# Patient Record
Sex: Female | Born: 1960 | Race: White | Hispanic: No | Marital: Married | Smoking: Never smoker
Health system: Southern US, Community
[De-identification: ages and names within clinical notes are randomized; demographics above are authoritative.]

## PROBLEM LIST (undated history)

## (undated) DIAGNOSIS — I509 Heart failure, unspecified: Secondary | ICD-10-CM

## (undated) DIAGNOSIS — I10 Essential (primary) hypertension: Secondary | ICD-10-CM

## (undated) DIAGNOSIS — F329 Major depressive disorder, single episode, unspecified: Secondary | ICD-10-CM

## (undated) DIAGNOSIS — M797 Fibromyalgia: Secondary | ICD-10-CM

## (undated) DIAGNOSIS — E119 Type 2 diabetes mellitus without complications: Secondary | ICD-10-CM

## (undated) DIAGNOSIS — E785 Hyperlipidemia, unspecified: Secondary | ICD-10-CM

## (undated) DIAGNOSIS — D649 Anemia, unspecified: Secondary | ICD-10-CM

## (undated) DIAGNOSIS — E039 Hypothyroidism, unspecified: Secondary | ICD-10-CM

## (undated) DIAGNOSIS — F32A Depression, unspecified: Secondary | ICD-10-CM

---

## 2000-01-23 ENCOUNTER — Other Ambulatory Visit: Admission: RE | Admit: 2000-01-23 | Discharge: 2000-01-23 | Payer: Self-pay | Admitting: Obstetrics & Gynecology

## 2001-05-15 ENCOUNTER — Encounter: Payer: Self-pay | Admitting: *Deleted

## 2001-05-15 ENCOUNTER — Encounter: Admission: RE | Admit: 2001-05-15 | Discharge: 2001-05-15 | Payer: Self-pay | Admitting: *Deleted

## 2001-07-03 ENCOUNTER — Other Ambulatory Visit: Admission: RE | Admit: 2001-07-03 | Discharge: 2001-07-03 | Payer: Self-pay | Admitting: Obstetrics & Gynecology

## 2002-11-25 ENCOUNTER — Other Ambulatory Visit: Admission: RE | Admit: 2002-11-25 | Discharge: 2002-11-25 | Payer: Self-pay | Admitting: Obstetrics & Gynecology

## 2003-11-30 ENCOUNTER — Other Ambulatory Visit: Admission: RE | Admit: 2003-11-30 | Discharge: 2003-11-30 | Payer: Self-pay | Admitting: Obstetrics & Gynecology

## 2005-01-02 ENCOUNTER — Other Ambulatory Visit: Admission: RE | Admit: 2005-01-02 | Discharge: 2005-01-02 | Payer: Self-pay | Admitting: Obstetrics & Gynecology

## 2006-01-14 ENCOUNTER — Other Ambulatory Visit: Admission: RE | Admit: 2006-01-14 | Discharge: 2006-01-14 | Payer: Self-pay | Admitting: Obstetrics & Gynecology

## 2009-04-23 ENCOUNTER — Emergency Department (HOSPITAL_COMMUNITY): Admission: EM | Admit: 2009-04-23 | Discharge: 2009-04-23 | Payer: Self-pay | Admitting: Emergency Medicine

## 2009-05-12 ENCOUNTER — Emergency Department (HOSPITAL_COMMUNITY): Admission: EM | Admit: 2009-05-12 | Discharge: 2009-05-12 | Payer: Self-pay | Admitting: Emergency Medicine

## 2009-05-19 ENCOUNTER — Encounter: Payer: Self-pay | Admitting: Infectious Diseases

## 2009-05-20 ENCOUNTER — Ambulatory Visit: Payer: Self-pay | Admitting: Infectious Diseases

## 2009-05-20 ENCOUNTER — Ambulatory Visit (HOSPITAL_COMMUNITY): Admission: RE | Admit: 2009-05-20 | Discharge: 2009-05-20 | Payer: Self-pay | Admitting: Infectious Diseases

## 2009-05-20 DIAGNOSIS — A689 Relapsing fever, unspecified: Secondary | ICD-10-CM | POA: Insufficient documentation

## 2009-05-20 DIAGNOSIS — R112 Nausea with vomiting, unspecified: Secondary | ICD-10-CM | POA: Insufficient documentation

## 2009-05-20 DIAGNOSIS — L519 Erythema multiforme, unspecified: Secondary | ICD-10-CM | POA: Insufficient documentation

## 2009-05-20 LAB — CONVERTED CEMR LAB
ALT: 17 units/L (ref 0–35)
AST: 15 units/L (ref 0–37)
Albumin: 4 g/dL (ref 3.5–5.2)
Alkaline Phosphatase: 64 units/L (ref 39–117)
BUN: 15 mg/dL (ref 6–23)
Basophils Absolute: 0 10*3/uL (ref 0.0–0.1)
Basophils Relative: 0 % (ref 0–1)
CO2: 27 meq/L (ref 19–32)
Calcium: 9.9 mg/dL (ref 8.4–10.5)
Chloride: 93 meq/L — ABNORMAL LOW (ref 96–112)
Creatinine, Ser: 0.91 mg/dL (ref 0.40–1.20)
Eosinophils Absolute: 0.2 10*3/uL (ref 0.0–0.7)
Eosinophils Relative: 1 % (ref 0–5)
Glucose, Bld: 108 mg/dL — ABNORMAL HIGH (ref 70–99)
HCT: 32.2 % — ABNORMAL LOW (ref 36.0–46.0)
Hemoglobin: 10.2 g/dL — ABNORMAL LOW (ref 12.0–15.0)
Herpes Simplex Vrs I&II-IgM Ab (EIA): 3.62 — ABNORMAL HIGH
Lymphocytes Relative: 14 % (ref 12–46)
Lymphs Abs: 1.5 10*3/uL (ref 0.7–4.0)
MCHC: 31.7 g/dL (ref 30.0–36.0)
MCV: 82.8 fL (ref 78.0–100.0)
Monocytes Absolute: 0.7 10*3/uL (ref 0.1–1.0)
Monocytes Relative: 7 % (ref 3–12)
Neutro Abs: 8.2 10*3/uL — ABNORMAL HIGH (ref 1.7–7.7)
Neutrophils Relative %: 77 % (ref 43–77)
Platelets: 429 10*3/uL — ABNORMAL HIGH (ref 150–400)
Potassium: 4.3 meq/L (ref 3.5–5.3)
RBC: 3.89 M/uL (ref 3.87–5.11)
RDW: 15 % (ref 11.5–15.5)
Sed Rate: 30 mm/hr — ABNORMAL HIGH (ref 0–22)
Sodium: 137 meq/L (ref 135–145)
Total Bilirubin: 0.4 mg/dL (ref 0.3–1.2)
Total Protein: 7 g/dL (ref 6.0–8.3)
WBC: 10.6 10*3/uL — ABNORMAL HIGH (ref 4.0–10.5)

## 2009-05-21 ENCOUNTER — Encounter: Payer: Self-pay | Admitting: Infectious Diseases

## 2009-05-24 ENCOUNTER — Telehealth: Payer: Self-pay | Admitting: Infectious Diseases

## 2009-06-01 ENCOUNTER — Ambulatory Visit: Payer: Self-pay | Admitting: Infectious Diseases

## 2009-06-02 ENCOUNTER — Encounter: Payer: Self-pay | Admitting: Infectious Diseases

## 2009-06-13 ENCOUNTER — Encounter: Payer: Self-pay | Admitting: Infectious Diseases

## 2010-02-28 ENCOUNTER — Encounter (HOSPITAL_COMMUNITY): Admission: RE | Admit: 2010-02-28 | Discharge: 2010-05-11 | Payer: Self-pay | Admitting: Endocrinology

## 2010-10-29 HISTORY — PX: ENDOMETRIAL ABLATION: SHX621

## 2010-11-30 NOTE — Progress Notes (Signed)
Summary: phone note requesing labs/TY  Phone Note Call from Patient   Caller: Patient Call For: Clydie Braun MD Summary of Call: Patient calling requesting the results of labwork. She said someone called yesterday to give her results but she wasn't home. Initial call taken by: Starleen Arms CMA,  May 24, 2009 11:11 AM  Follow-up for Phone Call        i did not call her. Lab tests were all negative or unrevealing so far.  Can you call her to let her know Follow-up by: Clydie Braun MD,  May 24, 2009 12:45 PM  Additional Follow-up for Phone Call Additional follow up Details #1::        Thanks, patient is aware.

## 2010-11-30 NOTE — Miscellaneous (Signed)
Summary: HIPAA Restirictions  HIPAA Restirictions   Imported By: Florinda Marker 05/20/2009 15:41:52  _____________________________________________________________________  External Attachment:    Type:   Image     Comment:   External Document

## 2010-11-30 NOTE — Assessment & Plan Note (Signed)
Summary: new pt per df/kam   CC:  new patient.  History of Present Illness: one month of  June 26th ankle swelling and L arm rash.  went to ED fast tact and changed to increased dose of hctz.  was having low grade fever. Several days later had rash on both arms, increasing swelling and pain in arms, knees, elbows necks. -went to prompt med on 7/2 7/2 Seen at [facility] w/u done.  Fevers to 100.9.  Treated for ?RMSF - got doxy esr 90 RF 30.  July 9th f/u started prednisone for rash. still on doxy for 21 days  Prednisone heled with the rash  7/15 Return to ed visit fever ha chills achiness, neck pain.  T 102.5. UA trace hgb, ow neg,  Had LP done CSF - WBC present but no org wbc 4 rbc 0 tube 4 wbc 6 rbc1 glucose 60 prot 31  No other abx given.  7/16 vomiting since that time esp at night.  .  Continues with low grade fevers 101.  rash continues  Rash recurring since stopping the prednisone last thursday.  Swelling improving somewhat Also recently   No other sick contacts     Preventive Screening-Counseling & Management  Alcohol-Tobacco     Alcohol drinks/day: 0     Smoking Status: never  Caffeine-Diet-Exercise     Caffeine use/day: tea,coffee     Does Patient Exercise: yes     Type of exercise: video tape exercising     Exercise (avg: min/session): <30     Times/week: 3  Safety-Violence-Falls     Seat Belt Use: yes   Updated Prior Medication List: FLUOXETINE HCL 20 MG CAPS (FLUOXETINE HCL) Take 1 tablet by mouth once a day HYDROCHLOROTHIAZIDE 12.5 MG TABS (HYDROCHLOROTHIAZIDE) Take 1 tablet by mouth once a day LIPITOR 10 MG TABS (ATORVASTATIN CALCIUM) Take 1 tablet by mouth once a day LEVOTHYROXINE SODIUM 75 MCG TABS (LEVOTHYROXINE SODIUM) Take 1 tablet by mouth once a day LISINOPRIL 20 MG TABS (LISINOPRIL) Take 1 tablet by mouth once a day METFORMIN HCL 1000 MG TABS (METFORMIN HCL) Take 1 tablet by mouth two times a day GLIMEPIRIDE 2 MG TABS (GLIMEPIRIDE) Take 1 tablet by  mouth two times a day CYCLOBENZAPRINE HCL 10 MG TABS (CYCLOBENZAPRINE HCL) as needed GABAPENTIN 600 MG TABS (GABAPENTIN) Take 1 tablet by mouth at bedtime TRAMADOL HCL 50 MG TABS (TRAMADOL HCL) Take 1 tablet by mouth at bedtime and as needed MULTIVITAMINS  TABS (MULTIPLE VITAMIN) Take 1 tablet by mouth once a day VITAMIN D3 1000 UNIT TABS (CHOLECALCIFEROL) Take 2 tablets by mouth once a day FERREX 28  TABS (FEASPGL-FEFUM-B12-FA-C-SUCC AC) Take 1 tablet by mouth once a day  Current Allergies (reviewed today): No known allergies  Past History:  Family History: Last updated: 05/20/2009 [place]  Social History: Last updated: 05/20/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   tob alcohol none drugs travel  Has 2 dogs and removes ticks but no other tick exposures.  Lives in [place], has 2 dogs, no farm animals, no travel no.  Has a well.  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   tob alcohol none drugs travel  Has 2 dogs and removes ticks but no other  tick exposures.  Lives in [place], has 2 dogs, no farm animals, no travel no.  Has a well.  No water exposures  Review of Systems       11 systems reviewed and negative except per HPI   Vital Signs:  Patient profile:   50 year old female Height:      66 inches (167.64 cm) Weight:      162.8 pounds (74 kg) BMI:     26.37 Temp:     98.4 degrees F (36.89 degrees C) oral Pulse rate:   116 / minute BP sitting:   129 / 85  (left arm)  Vitals Entered By: Rocky Morel) (May 20, 2009 9:50 AM) CC: new patient Is Patient Diabetic? Yes  Pain Assessment Patient in pain? yes     Location: hands and  feet Intensity: 3 Type: soreness Nutritional Status BMI of 25 - 29 = overweight Nutritional Status Detail blood sugar count this morning was 137. appetite is not good, vomited last night (05-19-09) per patient.  Have you ever been in a relationship where you felt threatened, hurt or afraid?Unable to ask-daughter in room   Does patient need assistance? Functional Status Self care Ambulation Normal   Physical Exam  General:  alert and well-developed.   Head:  normocephalic.   Eyes:  vision grossly intact, pupils equal, and pupils round. No conjuctial lesiosn   Ears:  R ear normal and L ear normal.   Mouth:  good dentition.  mild erythema in throat. No oral lesions mild cold sore on lower lip Neck:  supple.   Lungs:  normal respiratory effort and normal breath sounds.   Heart:  normal rate, regular rhythm, and no murmur.   Abdomen:  soft, non-tender, and normal bowel sounds.   Msk:  normal ROM, no joint tenderness, no joint swelling, no joint warmth, and no redness over joints.   Pulses:  2+ bil Extremities:  nocce Neurologic:  alert & oriented X3, cranial nerves II-XII intact, strength normal in all extremities, sensation intact to light touch, sensation intact to pinprick, and gait normal.   Skin:  arms and palms and dorsum of hands  with multiple distinct varied forms of  macular pink lesions.  typical bulls eye on several spots.  No vesiicles or spreading redness  Spares chest, trunk, face and LE Cervical Nodes:  no anterior cervical adenopathy and no posterior cervical adenopathy.   Psych:  Oriented X3.   Additional Exam:  7/2 ESR90 RF 30 ANA neg,compwnl lyme Igg igm neg  ebv old infxn parvo igg 2.6 Positive  igm <0.9  rmsf igm 0.99 equivicol CBC  WBC                                      8.2               4.0-10.5         K/uL  RBC                                      3.82       l      3.87-5.11        MIL/uL  Hemoglobin (HGB)  10.6       l       12.0-15.0        g/dL  Hematocrit (HCT)                         32.3       l      36.0-46.0        %  MCV                                      84.6              78.0-100.0       fL  MCHC                                     32.7              30.0-36.0        g/dL  RDW                                      15.9       h      11.5-15.5        %  Platelet Count (PLT)                     415        h      150-400          K/uL  Neutrophils, %                           66                43-77            %  Lymphocytes, %                           23                12-46            %  Monocytes, %                             7                 3-12             %  Eosinophils, %                           3                 0-5              %  Basophils, %                             1      Impression & Recommendations:  Problem # 1:  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  She really has no pulm sxs to suggest pna but will check cxr for "walking PNA".     No new meds or drugs.  No sick contacts. Will check  Comp panel, cbc rpr, hiv bcx mycoplasma serology cold agglutins HSV serology F/U2 weeks If recurs will consider in future  hepatitis panel bxp of rash erhlicia coxsackie  Also consider valtrex if potentially due to hsv   Orders: T-Herpes I and II, IgM, Reflex ([id number]) T-HIV Antibody  (Reflex) [id number]) T-RPR (Syphilis) ([id number]) T-Sed Rate (Automated) ([id number]) T-CBC w/Diff ([id number]) T- * Misc. Laboratory test [id number]) T-Culture, Blood Routine ([id number]) T- * Misc. Laboratory test ([phone]) Consultation Level IV [id number]) CXR- 2view (CXR) T-Comprehensive Metabolic Panel  ([id number]) T-Culture, Blood Routine ([id number])  Problem # 2:  FEVER, RECURRENT (ICD-087.9) Assessment: New as above.   Continue to monitor your temperature three times a day and record.  Bring your temperature and symptom diary with you to nnext visit. Call if new or concerning symptoms  Problem # 3:  NAUSEA WITH VOMITING (ICD-787.01) unclear etiology. ? due to doxy. Check LFTs and eval.  Medications Added to Medication List This Visit: 1)  Fluoxetine Hcl 20 Mg Caps (Fluoxetine hcl) .... Take 1 tablet by mouth once a day 2)  Hydrochlorothiazide 12.5 Mg Tabs (Hydrochlorothiazide) .... Take 1 tablet by mouth once a day 3)  Lipitor 10 Mg Tabs (Atorvastatin calcium) .... Take 1 tablet by mouth once a day 4)  Levothyroxine Sodium 75 Mcg Tabs (Levothyroxine sodium) .... Take 1 tablet by mouth once a day 5)  Lisinopril 20 Mg Tabs (Lisinopril) .... Take 1 tablet by mouth once a day 6)  Metformin Hcl 1000 Mg Tabs (Metformin hcl) .... Take 1 tablet by mouth two times a day 7)  Glimepiride 2 Mg Tabs (Glimepiride) .... Take 1 tablet by mouth two times a day 8)  Cyclobenzaprine Hcl 10 Mg Tabs (Cyclobenzaprine hcl) .... As needed 9)  Gabapentin 600 Mg Tabs (Gabapentin) .... Take 1 tablet by mouth at bedtime 10)  Tramadol Hcl 50 Mg Tabs (Tramadol hcl) .... Take 1 tablet by mouth at bedtime and as needed 11)  Multivitamins Tabs (Multiple vitamin) .... Take 1 tablet by mouth once a day 12)  Vitamin D3 1000 Unit Tabs (Cholecalciferol) .... Take 2 tablets by mouth once a day 13)  Ferrex 28 Tabs (Feaspgl-fefum-b12-fa-c-succ ac) .... Take 1 tablet by mouth once a day  Patient Instructions: 1)  Please schedule a follow-up appointment in 2 weeks. 2)  Stop the doxycycline.  3)  Continue to monitor your temperature three times a day and record.  Bring your temperature and symptom diary with you to nnext visit. 4)  Call if new or concerning symptoms

## 2010-11-30 NOTE — Assessment & Plan Note (Signed)
Summary: 2WK F/U/VS   CC:  f/u ov   still having breakouts in new places itching with pain present and Depression.  History of Present Illness: Starting to feel better overall but rash continues.   palmar lsions are tender and raised continue to occur in all areas NO futher vomiting Temp max was 100.1 on 7/24 but no other temps above 100 since then. Not on any abx or steroids at this point   Prior history from July 23rd visit one month of Rash   June 26th ankle swelling and L arm rash.  went to ED fast tact and changed to increased dose of hctz.  was having low grade fever. Several days later had rash on both arms, increasing swelling and pain in arms, knees, elbows necks. -went to prompt med on 7/2 7/2 Seen at [facility] w/u done.  Fevers to 100.9.  Treated for ?RMSF - got doxy esr 90 RF 30.  July 9th f/u started prednisone for rash. still on doxy for 21 days  Prednisone heled with the rash  7/15 Return to ed visit fever ha chills achiness, neck pain.  T 102.5. UA trace hgb, ow neg,  Had LP done CSF - WBC present but no org wbc 4 rbc 0 tube 4 wbc 6 rbc1 glucose 60 prot 31  No other abx given.  7/16 vomiting since that time esp at night.  .  Continues with low grade fevers 101.  rash continues  Rash recurring since stopping the prednisone last thursday.  Swelling improving somewhat Also recently   No other sick contacts     Depression History:      The patient denies a depressed mood most of the day and a diminished interest in her usual daily activities.        The patient denies that she feels like life is not worth living, denies that she wishes that she were dead, and denies that she has thought about ending her life.        Preventive Screening-Counseling & Management  Alcohol-Tobacco     Alcohol drinks/day: 0     Smoking Status: never   Prior Medication List:  FLUOXETINE HCL 20 MG CAPS (FLUOXETINE HCL) Take 1 tablet by mouth once a day HYDROCHLOROTHIAZIDE 12.5 MG  TABS (HYDROCHLOROTHIAZIDE) Take 1 tablet by mouth once a day LIPITOR 10 MG TABS (ATORVASTATIN CALCIUM) Take 1 tablet by mouth once a day LEVOTHYROXINE SODIUM 75 MCG TABS (LEVOTHYROXINE SODIUM) Take 1 tablet by mouth once a day LISINOPRIL 20 MG TABS (LISINOPRIL) Take 1 tablet by mouth once a day METFORMIN HCL 1000 MG TABS (METFORMIN HCL) Take 1 tablet by mouth two times a day GLIMEPIRIDE 2 MG TABS (GLIMEPIRIDE) Take 1 tablet by mouth two times a day CYCLOBENZAPRINE HCL 10 MG TABS (CYCLOBENZAPRINE HCL) as needed GABAPENTIN 600 MG TABS (GABAPENTIN) Take 1 tablet by mouth at bedtime TRAMADOL HCL 50 MG TABS (TRAMADOL HCL) Take 1 tablet by mouth at bedtime and as needed MULTIVITAMINS  TABS (MULTIPLE VITAMIN) Take 1 tablet by mouth once a day VITAMIN D3 1000 UNIT TABS (CHOLECALCIFEROL) Take 2 tablets by mouth once a day FERREX 28  TABS (FEASPGL-FEFUM-B12-FA-C-SUCC AC) Take 1 tablet by mouth once a day   Current Allergies: No known allergies  Past History:  Past Medical History: Last updated: 05/20/2009 dm II HTN Hyoperchol fibromyalgia  Past Surgical History: Last updated: 05/20/2009 Lumpectomy L breast 1995 Bxp R breast  Family History: Last updated: 05/20/2009 [place]  Social History: Last updated:  05/20/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   tob alcohol none drugs travel  Has 2 dogs and removes ticks but no other tick exposures.  Lives in [place], has 2 dogs, no farm animals, no travel no.  Has a well.  No water exposures  Risk Factors: Alcohol Use: 0 (06/01/2009) Caffeine Use: tea,coffee (05/20/2009) Exercise: yes (05/20/2009)  Risk Factors: Smoking Status: never (06/01/2009)  Review of Systems       11 systems reviewed and negative except per HPI   Vital Signs:  Patient profile:   50 year old female Height:      66 inches Weight:      165.8 pounds BMI:     26.86 BSA:     1.85 Temp:     99.2 degrees F oral Pulse rate:   116 / minute BP sitting:   104 / 72   (left arm)  Vitals Entered By: Orland Mustard RN (June 01, 2009 3:37 PM) CC: f/u ov   still having breakouts in new places itching with pain present, Depression Is Patient Diabetic? Yes  Pain Assessment Patient in pain? yes     Location: biil  hand  Intensity: 3 Type: aching Nutritional Status Detail normal  Have you ever been in a relationship where you felt threatened, hurt or afraid?No  Domestic Violence Intervention none  Does patient need assistance? Functional Status Self care Ambulation Normal   Physical Exam  General:  alert and well-nourished.  tired appearing Head:  normocephalic and atraumatic.   Eyes:  vision grossly intact, pupils equal, and pupils round.  no lesions  Mouth:  fair dentition.   Neck:  supple.   Lungs:  normal respiratory effort and normal breath sounds.   Heart:  normal rate and no murmur.   Abdomen:  normal bowel sounds.   Msk:  normal ROM, no joint tenderness, and no joint swelling.   Extremities:  no cce Neurologic:  alert & oriented X3 and cranial nerves II-XII intact.   Skin:  palms with tender raised slighty annular lesions of various sizes ranging form 1/2 cm to 2 cm where they coalesce.  also some desquamation on palms  Multiple other various size lesions of different shapes and sizes and in different stages over the arms, thighs.and lower legs dorsum of feet.  Mianly over the dorsum of hands where they have coalescesda dn appear like deep bruises. Spares chest back abd and face  Cervical Nodes:  no anterior cervical adenopathy and no posterior cervical adenopathy.   Psych:  Oriented X3 and memory intact for recent and remote.     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  Fevers have resolved.   Her w/u below was neg however she still has recurring lesions.  Systemically feels a little better with resolution of fevers.   I still think she most likely has EM  minor given the characteristic lesions.  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    No new meds or drugs.  No sick contacts. At this point I will refer to derm and consider use of prednisone.  Workup so far Comp panel wnl  cbc - wbc 10.6 no eosinophilia, hgb mild anemia at 10.4, plts sligtly high rpr, hiv - Negative  bcx negative mycoplasma serology IgM negative, IgG with probable prior  cold agglutins negative HSV serology c/w old infection  Orders: Est. Patient Level IV (02774) Dermatology Referral (Derma)  Problem # 2:  FEVER, RECURRENT (ICD-087.9) Assessment: Improved  Orders: Est. Patient Level IV (12878) Dermatology Referral (Derma)  Problem # 3:  NAUSEA WITH VOMITING (ICD-787.01) Assessment: Improved  Patient Instructions: 1)  We will make an appointment with dermatology for you.   2)  Continue to monitor your temperature curve. 3)  Call if new symptoms occur. 4)  Please schedule a follow-up appointment in 1 month.

## 2010-11-30 NOTE — Consult Note (Signed)
Summary: Dr. Pollyann Savoy  Dr. Pollyann Savoy   Imported By: Florinda Marker 05/23/2009 14:51:17  _____________________________________________________________________  External Attachment:    Type:   Image     Comment:   External Document

## 2010-11-30 NOTE — Miscellaneous (Signed)
Summary: [facility] @ [facility] [place]   Imported By: Florinda Marker 06/15/2009 14:00:41  _____________________________________________________________________  External Attachment:    Type:   Image     Comment:   External Document

## 2010-11-30 NOTE — Consult Note (Signed)
Summary: [facility]   Imported By: Florinda Marker 06/02/2009 14:27:01  _____________________________________________________________________  External Attachment:    Type:   Image     Comment:   External Document

## 2010-12-25 ENCOUNTER — Encounter (HOSPITAL_COMMUNITY)
Admission: RE | Admit: 2010-12-25 | Discharge: 2010-12-25 | Disposition: A | Discharge status: Home / Self Care | Payer: BC Managed Care – PPO | Source: Ambulatory Visit | Attending: Obstetrics and Gynecology | Admitting: Obstetrics and Gynecology

## 2011-01-01 ENCOUNTER — Ambulatory Visit (HOSPITAL_COMMUNITY)
Admission: RE | Admit: 2011-01-01 | Discharge: 2011-01-01 | Disposition: A | Discharge status: Home / Self Care | Payer: BC Managed Care – PPO | Source: Ambulatory Visit | Attending: Obstetrics and Gynecology | Admitting: Obstetrics and Gynecology

## 2011-01-01 ENCOUNTER — Other Ambulatory Visit: Payer: Self-pay | Admitting: Obstetrics and Gynecology

## 2011-01-01 DIAGNOSIS — N840 Polyp of corpus uteri: Secondary | ICD-10-CM | POA: Insufficient documentation

## 2011-01-01 DIAGNOSIS — N8501 Benign endometrial hyperplasia: Secondary | ICD-10-CM | POA: Insufficient documentation

## 2011-01-01 DIAGNOSIS — Z01818 Encounter for other preprocedural examination: Secondary | ICD-10-CM | POA: Insufficient documentation

## 2011-01-01 DIAGNOSIS — Z01812 Encounter for preprocedural laboratory examination: Secondary | ICD-10-CM | POA: Insufficient documentation

## 2011-01-01 DIAGNOSIS — N920 Excessive and frequent menstruation with regular cycle: Secondary | ICD-10-CM | POA: Insufficient documentation

## 2011-01-01 LAB — GLUCOSE, CAPILLARY
Glucose-Capillary: 141 mg/dL — ABNORMAL HIGH (ref 70–99)
Glucose-Capillary: 142 mg/dL — ABNORMAL HIGH (ref 70–99)

## 2011-01-05 NOTE — Op Note (Signed)
  NAME:  Robin Owen, ALCOTT NO.:  [id number]  MEDICAL RECORD NO.:  [id number]           PATIENT TYPE:  O  LOCATION:  [facility]                          FACILITY[facility]  [facility]  PHYSICIAN:  Malva Limes, M.D.    DATE OF BIRTH:  01-Jul-1961  DATE OF PROCEDURE:  01/01/2011 DATE OF DISCHARGE:                              OPERATIVE REPORT   PREOPERATIVE DIAGNOSES: 1. Menorrhagia. 2. Uterine fibroids.  POSTOPERATIVE DIAGNOSES: 1. Menorrhagia. 2. Uterine fibroids.  PROCEDURES: 1. Hysteroscopy. 2. Dilation and curettage. 3. Endometrial ablation with NovaSure device.  SURGEON:  Malva Limes, MD  ANESTHESIA:  General paracervical block.  ANTIBIOTIC:  Ancef 1 g.  DRAINS:  Red rubber catheter to bladder.  SPECIMENS:  Endometrial curettings sent to Pathology.  COMPLICATIONS:  None.  ESTIMATED BLOOD LOSS:  Minimal.  PROCEDURE IN DETAILS:  The patient was taken to the operating room where general anesthetic was administered without difficulty.  She was then placed in dorsal lithotomy position.  She was prepped and draped in the usual fashion for this procedure.  Her bladder was drained with a red rubber catheter.  A sterile speculum was placed in the vagina, 10 mL of 1% lidocaine was used for paracervical block.  The cervix was grasped with single-tooth tenaculum and sounded to 10 cm.  The cervix was serially dilated.  The hysteroscope was advanced into the uterine cavity at which point both ostia were visualized.  There was no evidence of any polyps or submucous fibroids.  The hysteroscope was then removed.  Sharp curettage was then performed.  The tissue was sent to Pathology.  The NovaSure device was then placed into uterine cavity.  The uterine cavity length was 6.5 cm and the width was 4.4 cm.  A seal test was passed.  The device was turned on for 1 minute.  The device was then removed.  The patient was awakened and taken to the recovery room in stable condition.   Instrument and lap counts were correct x2.          ______________________________ Malva Limes, M.D.     MA/MEDQ  D:  01/01/2011  T:  01/02/2011  Job:  [id number]  Electronically Signed by Malva Limes M.D. on 01/04/2011 09:46:33 AM

## 2011-02-05 LAB — POCT I-STAT, CHEM 8
BUN: 11 mg/dL (ref 6–23)
Calcium, Ion: 1.15 mmol/L (ref 1.12–1.32)
Chloride: 98 mEq/L (ref 96–112)
Creatinine, Ser: 0.9 mg/dL (ref 0.4–1.2)
Glucose, Bld: 67 mg/dL — ABNORMAL LOW (ref 70–99)
HCT: 32 % — ABNORMAL LOW (ref 36.0–46.0)
Hemoglobin: 10.9 g/dL — ABNORMAL LOW (ref 12.0–15.0)
Potassium: 4.2 mEq/L (ref 3.5–5.1)
Sodium: 137 mEq/L (ref 135–145)
TCO2: 29 mmol/L (ref 0–100)

## 2011-02-05 LAB — DIFFERENTIAL
Basophils Absolute: 0 10*3/uL (ref 0.0–0.1)
Basophils Relative: 1 % (ref 0–1)
Eosinophils Absolute: 0.3 10*3/uL (ref 0.0–0.7)
Eosinophils Relative: 3 % (ref 0–5)
Lymphocytes Relative: 23 % (ref 12–46)
Lymphs Abs: 1.9 10*3/uL (ref 0.7–4.0)
Monocytes Absolute: 0.6 10*3/uL (ref 0.1–1.0)
Monocytes Relative: 7 % (ref 3–12)
Neutro Abs: 5.4 10*3/uL (ref 1.7–7.7)
Neutrophils Relative %: 66 % (ref 43–77)

## 2011-02-05 LAB — PROTEIN AND GLUCOSE, CSF
Glucose, CSF: 60 mg/dL (ref 43–76)
Total  Protein, CSF: 31 mg/dL (ref 15–45)

## 2011-02-05 LAB — CBC
HCT: 32.3 % — ABNORMAL LOW (ref 36.0–46.0)
Hemoglobin: 10.6 g/dL — ABNORMAL LOW (ref 12.0–15.0)
MCHC: 32.7 g/dL (ref 30.0–36.0)
MCV: 84.6 fL (ref 78.0–100.0)
Platelets: 415 10*3/uL — ABNORMAL HIGH (ref 150–400)
RBC: 3.82 MIL/uL — ABNORMAL LOW (ref 3.87–5.11)
RDW: 15.9 % — ABNORMAL HIGH (ref 11.5–15.5)
WBC: 8.2 10*3/uL (ref 4.0–10.5)

## 2011-02-05 LAB — CSF CULTURE W GRAM STAIN: Culture: NO GROWTH

## 2011-02-05 LAB — URINALYSIS, ROUTINE W REFLEX MICROSCOPIC
Bilirubin Urine: NEGATIVE
Glucose, UA: NEGATIVE mg/dL
Ketones, ur: NEGATIVE mg/dL
Leukocytes, UA: NEGATIVE
Nitrite: NEGATIVE
Protein, ur: NEGATIVE mg/dL
Specific Gravity, Urine: 1.005 (ref 1.005–1.030)
Urobilinogen, UA: 0.2 mg/dL (ref 0.0–1.0)
pH: 6 (ref 5.0–8.0)

## 2011-02-05 LAB — URINE MICROSCOPIC-ADD ON

## 2011-02-05 LAB — CSF CELL COUNT WITH DIFFERENTIAL
RBC Count, CSF: 0 /mm3
RBC Count, CSF: 1 /mm3 — ABNORMAL HIGH
Tube #: 3
Tube #: 4
WBC, CSF: 4 /mm3 (ref 0–5)
WBC, CSF: 6 /mm3 — ABNORMAL HIGH (ref 0–5)

## 2011-02-05 LAB — GRAM STAIN

## 2013-04-21 ENCOUNTER — Other Ambulatory Visit: Payer: Self-pay | Admitting: Obstetrics & Gynecology

## 2014-06-08 ENCOUNTER — Other Ambulatory Visit: Payer: Self-pay | Admitting: Obstetrics & Gynecology

## 2014-06-10 LAB — CYTOLOGY - PAP

## 2014-12-21 ENCOUNTER — Other Ambulatory Visit: Payer: Self-pay | Admitting: Gastroenterology

## 2014-12-21 DIAGNOSIS — K829 Disease of gallbladder, unspecified: Secondary | ICD-10-CM

## 2014-12-28 ENCOUNTER — Ambulatory Visit
Admission: RE | Admit: 2014-12-28 | Discharge: 2014-12-28 | Disposition: A | Discharge status: Home / Self Care | Payer: BLUE CROSS/BLUE SHIELD | Source: Ambulatory Visit | Attending: Gastroenterology | Admitting: Gastroenterology

## 2014-12-28 DIAGNOSIS — K829 Disease of gallbladder, unspecified: Secondary | ICD-10-CM

## 2015-09-14 ENCOUNTER — Encounter (HOSPITAL_BASED_OUTPATIENT_CLINIC_OR_DEPARTMENT_OTHER): Payer: Self-pay | Admitting: *Deleted

## 2015-09-14 ENCOUNTER — Observation Stay (HOSPITAL_BASED_OUTPATIENT_CLINIC_OR_DEPARTMENT_OTHER)
Admission: EM | Admit: 2015-09-14 | Discharge: 2015-09-17 | Disposition: A | Discharge status: Home / Self Care | Payer: BLUE CROSS/BLUE SHIELD | Attending: Internal Medicine | Admitting: Internal Medicine

## 2015-09-14 ENCOUNTER — Emergency Department (HOSPITAL_BASED_OUTPATIENT_CLINIC_OR_DEPARTMENT_OTHER): Payer: BLUE CROSS/BLUE SHIELD

## 2015-09-14 DIAGNOSIS — M797 Fibromyalgia: Secondary | ICD-10-CM | POA: Diagnosis not present

## 2015-09-14 DIAGNOSIS — R0602 Shortness of breath: Secondary | ICD-10-CM

## 2015-09-14 DIAGNOSIS — I5021 Acute systolic (congestive) heart failure: Secondary | ICD-10-CM | POA: Diagnosis present

## 2015-09-14 DIAGNOSIS — E785 Hyperlipidemia, unspecified: Secondary | ICD-10-CM | POA: Diagnosis not present

## 2015-09-14 DIAGNOSIS — I272 Other secondary pulmonary hypertension: Secondary | ICD-10-CM | POA: Insufficient documentation

## 2015-09-14 DIAGNOSIS — N179 Acute kidney failure, unspecified: Secondary | ICD-10-CM | POA: Insufficient documentation

## 2015-09-14 DIAGNOSIS — E119 Type 2 diabetes mellitus without complications: Secondary | ICD-10-CM | POA: Insufficient documentation

## 2015-09-14 DIAGNOSIS — I110 Hypertensive heart disease with heart failure: Principal | ICD-10-CM | POA: Insufficient documentation

## 2015-09-14 DIAGNOSIS — Z7984 Long term (current) use of oral hypoglycemic drugs: Secondary | ICD-10-CM | POA: Diagnosis not present

## 2015-09-14 DIAGNOSIS — Z79899 Other long term (current) drug therapy: Secondary | ICD-10-CM | POA: Diagnosis not present

## 2015-09-14 DIAGNOSIS — I10 Essential (primary) hypertension: Secondary | ICD-10-CM | POA: Diagnosis present

## 2015-09-14 DIAGNOSIS — E039 Hypothyroidism, unspecified: Secondary | ICD-10-CM | POA: Diagnosis not present

## 2015-09-14 DIAGNOSIS — E876 Hypokalemia: Secondary | ICD-10-CM | POA: Diagnosis not present

## 2015-09-14 DIAGNOSIS — I420 Dilated cardiomyopathy: Secondary | ICD-10-CM | POA: Insufficient documentation

## 2015-09-14 DIAGNOSIS — I428 Other cardiomyopathies: Secondary | ICD-10-CM

## 2015-09-14 DIAGNOSIS — I509 Heart failure, unspecified: Secondary | ICD-10-CM | POA: Insufficient documentation

## 2015-09-14 DIAGNOSIS — I429 Cardiomyopathy, unspecified: Secondary | ICD-10-CM

## 2015-09-14 HISTORY — DX: Fibromyalgia: M79.7

## 2015-09-14 HISTORY — DX: Essential (primary) hypertension: I10

## 2015-09-14 HISTORY — DX: Hyperlipidemia, unspecified: E78.5

## 2015-09-14 HISTORY — DX: Hypothyroidism, unspecified: E03.9

## 2015-09-14 HISTORY — DX: Type 2 diabetes mellitus without complications: E11.9

## 2015-09-14 LAB — BASIC METABOLIC PANEL
Anion gap: 10 (ref 5–15)
BUN: 17 mg/dL (ref 6–20)
CO2: 27 mmol/L (ref 22–32)
Calcium: 9.1 mg/dL (ref 8.9–10.3)
Chloride: 99 mmol/L — ABNORMAL LOW (ref 101–111)
Creatinine, Ser: 1.14 mg/dL — ABNORMAL HIGH (ref 0.44–1.00)
GFR calc Af Amer: >60 mL/min (ref >60)
GFR calc non Af Amer: 55 mL/min — ABNORMAL LOW (ref >60)
Glucose, Bld: 97 mg/dL (ref 65–99)
Potassium: 3.5 mmol/L (ref 3.5–5.1)
Sodium: 136 mmol/L (ref 135–145)

## 2015-09-14 LAB — CBC WITH DIFFERENTIAL/PLATELET
Basophils Absolute: 0 10*3/uL (ref 0.0–0.1)
Basophils Relative: 0 %
Eosinophils Absolute: 0.1 10*3/uL (ref 0.0–0.7)
Eosinophils Relative: 1 %
HCT: 35.8 % — ABNORMAL LOW (ref 36.0–46.0)
Hemoglobin: 11.6 g/dL — ABNORMAL LOW (ref 12.0–15.0)
Lymphocytes Relative: 20 %
Lymphs Abs: 2.4 10*3/uL (ref 0.7–4.0)
MCH: 28.5 pg (ref 26.0–34.0)
MCHC: 32.4 g/dL (ref 30.0–36.0)
MCV: 88 fL (ref 78.0–100.0)
Monocytes Absolute: 0.7 10*3/uL (ref 0.1–1.0)
Monocytes Relative: 6 %
Neutro Abs: 9.2 10*3/uL — ABNORMAL HIGH (ref 1.7–7.7)
Neutrophils Relative %: 73 %
Platelets: 385 10*3/uL (ref 150–400)
RBC: 4.07 MIL/uL (ref 3.87–5.11)
RDW: 14.9 % (ref 11.5–15.5)
WBC: 12.4 10*3/uL — ABNORMAL HIGH (ref 4.0–10.5)

## 2015-09-14 LAB — D-DIMER, QUANTITATIVE: D-Dimer, Quant: 1.72 ug/mL-FEU — ABNORMAL HIGH (ref 0.00–0.50)

## 2015-09-14 LAB — BRAIN NATRIURETIC PEPTIDE: B Natriuretic Peptide: 1819.1 pg/mL — ABNORMAL HIGH (ref 0.0–100.0)

## 2015-09-14 LAB — URINALYSIS, ROUTINE W REFLEX MICROSCOPIC
Bilirubin Urine: NEGATIVE
Glucose, UA: NEGATIVE mg/dL
Hgb urine dipstick: NEGATIVE
Ketones, ur: NEGATIVE mg/dL
Leukocytes, UA: NEGATIVE
Nitrite: NEGATIVE
Protein, ur: NEGATIVE mg/dL
Specific Gravity, Urine: 1.007 (ref 1.005–1.030)
pH: 6 (ref 5.0–8.0)

## 2015-09-14 LAB — CBG MONITORING, ED: Glucose-Capillary: 93 mg/dL (ref 65–99)

## 2015-09-14 LAB — TROPONIN I: Troponin I: 0.09 ng/mL — ABNORMAL HIGH (ref <0.031)

## 2015-09-14 MED ORDER — SODIUM CHLORIDE 0.9 % IJ SOLN
3.0000 mL | Freq: Two times a day (BID) | INTRAMUSCULAR | Status: DC
  Administered 2015-09-15 (×2): 3 mL via INTRAVENOUS

## 2015-09-14 MED ORDER — SODIUM CHLORIDE 0.9 % IV SOLN: 250.0000 mL | INTRAVENOUS | Status: DC | PRN

## 2015-09-14 MED ORDER — ONDANSETRON HCL 4 MG/2ML IJ SOLN: 4.0000 mg | Freq: Four times a day (QID) | INTRAMUSCULAR | Status: DC | PRN

## 2015-09-14 MED ORDER — IOHEXOL 350 MG/ML SOLN
100.0000 mL | Freq: Once | INTRAVENOUS | Status: AC | PRN
  Administered 2015-09-14: 100 mL via INTRAVENOUS

## 2015-09-14 MED ORDER — FUROSEMIDE 10 MG/ML IJ SOLN
40.0000 mg | Freq: Once | INTRAMUSCULAR | Status: AC
  Administered 2015-09-15: 40 mg via INTRAVENOUS
  Filled 2015-09-14: qty 4

## 2015-09-14 MED ORDER — ASPIRIN EC 81 MG PO TBEC
81.0000 mg | DELAYED_RELEASE_TABLET | Freq: Every day | ORAL | Status: DC
  Administered 2015-09-15 – 2015-09-17 (×3): 81 mg via ORAL
  Filled 2015-09-14 (×3): qty 1

## 2015-09-14 MED ORDER — ONDANSETRON HCL 4 MG PO TABS: 4.0000 mg | ORAL_TABLET | Freq: Four times a day (QID) | ORAL | Status: DC | PRN

## 2015-09-14 MED ORDER — FUROSEMIDE 10 MG/ML IJ SOLN
20.0000 mg | Freq: Two times a day (BID) | INTRAMUSCULAR | Status: DC
  Administered 2015-09-15: 20 mg via INTRAVENOUS
  Filled 2015-09-14: qty 2

## 2015-09-14 MED ORDER — SODIUM CHLORIDE 0.9 % IJ SOLN: 3.0000 mL | INTRAMUSCULAR | Status: DC | PRN

## 2015-09-14 MED ORDER — POTASSIUM CHLORIDE 20 MEQ/15ML (10%) PO SOLN
20.0000 meq | Freq: Two times a day (BID) | ORAL | Status: DC
  Administered 2015-09-15 (×3): 20 meq via ORAL
  Filled 2015-09-14 (×3): qty 15

## 2015-09-14 MED ORDER — ENOXAPARIN SODIUM 40 MG/0.4ML ~~LOC~~ SOLN
40.0000 mg | Freq: Every day | SUBCUTANEOUS | Status: DC
  Administered 2015-09-15: 40 mg via SUBCUTANEOUS
  Filled 2015-09-14 (×3): qty 0.4

## 2015-09-14 MED ORDER — CARVEDILOL 3.125 MG PO TABS
3.1250 mg | ORAL_TABLET | Freq: Two times a day (BID) | ORAL | Status: DC
  Administered 2015-09-15: 3.125 mg via ORAL
  Filled 2015-09-14: qty 1

## 2015-09-14 MED ORDER — LISINOPRIL 20 MG PO TABS
20.0000 mg | ORAL_TABLET | Freq: Every day | ORAL | Status: DC
  Administered 2015-09-15 – 2015-09-17 (×3): 20 mg via ORAL
  Filled 2015-09-14 (×3): qty 1

## 2015-09-14 MED ORDER — INSULIN ASPART 100 UNIT/ML ~~LOC~~ SOLN
0.0000 [IU] | Freq: Three times a day (TID) | SUBCUTANEOUS | Status: DC
  Administered 2015-09-15 (×2): 1 [IU] via SUBCUTANEOUS
  Administered 2015-09-15: 3 [IU] via SUBCUTANEOUS
  Administered 2015-09-16: 2 [IU] via SUBCUTANEOUS
  Administered 2015-09-16: 1 [IU] via SUBCUTANEOUS
  Administered 2015-09-17: 2 [IU] via SUBCUTANEOUS
  Administered 2015-09-17: 1 [IU] via SUBCUTANEOUS

## 2015-09-14 MED ORDER — LISINOPRIL 2.5 MG PO TABS: 2.5000 mg | ORAL_TABLET | Freq: Every day | ORAL | Status: DC

## 2015-09-14 MED ORDER — ASPIRIN 81 MG PO CHEW
324.0000 mg | CHEWABLE_TABLET | Freq: Once | ORAL | Status: AC
  Administered 2015-09-14: 324 mg via ORAL
  Filled 2015-09-14: qty 4

## 2015-09-14 MED ORDER — SODIUM CHLORIDE 0.9 % IJ SOLN: 3.0000 mL | Freq: Two times a day (BID) | INTRAMUSCULAR | Status: DC

## 2015-09-14 NOTE — ED Provider Notes (Signed)
CSN: [id number]     Arrival date & time 09/14/15  1755 History   First MD Initiated Contact with Patient 09/14/15 1821     Chief Complaint  Patient presents with  . Shortness of Breath     (Consider location/radiation/quality/duration/timing/severity/associated sxs/prior Treatment) Patient is a 54 y.o. female presenting with shortness of breath. The history is provided by the patient.  Shortness of Breath Severity:  Moderate Onset quality:  Gradual Duration:  1 week Timing:  Constant Progression:  Unchanged Chronicity:  New Context: activity and weather changes   Context: not URI   Relieved by:  Nothing Worsened by:  Nothing tried Ineffective treatments:  None tried Associated symptoms: no abdominal pain, no chest pain, no cough, no diaphoresis, no fever, no hemoptysis, no PND, no sputum production and no wheezing   Risk factors: no hx of cancer, no hx of PE/DVT, no prolonged immobilization and no recent surgery     Past Medical History  Diagnosis Date  . Diabetes mellitus without complication (HCC)    No past surgical history on file. No family history on file. Social History  Substance Use Topics  . Smoking status: Never Smoker   . Smokeless tobacco: Never Used  . Alcohol Use: No   OB History    No data available     Review of Systems  Constitutional: Negative for fever and diaphoresis.  Respiratory: Positive for shortness of breath. Negative for cough, hemoptysis, sputum production and wheezing.   Cardiovascular: Negative for chest pain and PND.  Gastrointestinal: Negative for abdominal pain.  All other systems reviewed and are negative.     Allergies  Review of patient's allergies indicates no known allergies.  Home Medications   Prior to Admission medications   Not on File   BP 140/97 mmHg  Pulse 101  Temp(Src) 99.5 F (37.5 C) (Oral)  Resp 18  Ht 5\' 5"  (1.651 m)  Wt 170 lb (77.111 kg)  BMI 28.29 kg/m2  SpO2 95% Physical Exam   Constitutional: She is oriented to person, place, and time. She appears well-developed and well-nourished. No distress.  HENT:  Head: Normocephalic.  Eyes: Conjunctivae are normal.  Neck: Neck supple. No tracheal deviation present.  Cardiovascular: Regular rhythm and normal heart sounds.  Tachycardia present.   Pulmonary/Chest: Effort normal and breath sounds normal. No respiratory distress. She has no wheezes. She has no rales. She exhibits no tenderness.  Abdominal: Soft. She exhibits no distension. There is no tenderness.  Neurological: She is alert and oriented to person, place, and time. No cranial nerve deficit. GCS eye subscore is 4. GCS verbal subscore is 5. GCS motor subscore is 6.  Skin: Skin is warm and dry.  Psychiatric: She has a normal mood and affect.  Vitals reviewed.   ED Course  Procedures (including critical care time)   Emergency Focused Ultrasound Exam Limited Ultrasound of the Heart and Pericardium  Performed and interpreted by Dr. Clydene Pugh Indication: shortness of breath Multiple views of the heart, pericardium, and IVC are obtained with a multi frequency probe.  Findings: severely decreased contractility, no anechoic fluid, no IVC collapse, no RV dilatation Interpretation: severely depressed ejection fraction, no pericardial effusion, elevated CVP, no RV strain Images archived electronically.  CPT Code: 20601   Labs Review Labs Reviewed  BASIC METABOLIC PANEL - Abnormal; Notable for the following:    Chloride 99 (*)    Creatinine, Ser 1.14 (*)    GFR calc non Af Amer 55 (*)    All  other components within normal limits  CBC WITH DIFFERENTIAL/PLATELET - Abnormal; Notable for the following:    WBC 12.4 (*)    Hemoglobin 11.6 (*)    HCT 35.8 (*)    Neutro Abs 9.2 (*)    All other components within normal limits  D-DIMER, QUANTITATIVE (NOT AT [facility]) - Abnormal; Notable for the following:    D-Dimer, Quant 1.72 (*)    All other components within normal  limits  TROPONIN I - Abnormal; Notable for the following:    Troponin I 0.09 (*)    All other components within normal limits  BRAIN NATRIURETIC PEPTIDE - Abnormal; Notable for the following:    B Natriuretic Peptide 1819.1 (*)    All other components within normal limits  URINALYSIS, ROUTINE W REFLEX MICROSCOPIC (NOT AT [facility])  CBG MONITORING, ED    Imaging Review Dg Chest 2 View  09/14/2015  CLINICAL DATA:  Shortness of breath on exertion for 1 day. History of diabetes, hypertension and smoking. Initial encounter. EXAM: CHEST  2 VIEW COMPARISON:  05/20/2009. FINDINGS: Significant interval increase in the heart size, now moderately enlarged. The mediastinal contours are stable. There is new vascular congestion without overt pulmonary edema, confluent airspace opacity or significant pleural effusion. The bones appear unchanged. IMPRESSION: Interval significant enlargement of the cardiac silhouette, likely reflecting cardiomegaly with associated vascular congestion. A pericardial effusion would be a consideration. Electronically Signed   By: Carey Bullocks M.D.   On: 09/14/2015 18:59   Ct Angio Chest Pe W/cm &/or Wo Cm  09/14/2015  CLINICAL DATA:  Pt getting ekg Pt co SOB x 1 wk, getting worse, elevated d-dimer EXAM: CT ANGIOGRAPHY CHEST WITH CONTRAST TECHNIQUE: Multidetector CT imaging of the chest was performed using the standard protocol during bolus administration of intravenous contrast. Multiplanar CT image reconstructions and MIPs were obtained to evaluate the vascular anatomy. CONTRAST:  OMNIPAQUE IOHEXOL 350 MG/ML SOLN COMPARISON:  None. FINDINGS: Left arm IV contrast injection. Innominate vein and SVC patent. Reflux of contrast from right atrium into central aspect of hepatic veins. RV nondilated. Satisfactory opacification of pulmonary arteries noted, and there is no evidence of pulmonary emboli. Patent pulmonary veins. Incomplete opacification of the thoracic aorta limiting  evaluation for dissection. Classic 3 vessel brachiocephalic arterial origin anatomy. No significant atheromatous calcified plaque nor aneurysm. Trace bilateral pleural effusions. Trace pericardial effusion. No hilar or mediastinal adenopathy. Mild peripheral septal prominence in both lungs. No confluent airspace disease. Small amount of fluid in the interlobar fissures. Thoracic spine and sternum intact. Visualized portions of upper abdomen unremarkable. Review of the MIP images confirms the above findings. IMPRESSION: 1. Negative for acute PE or thoracic aortic aneurysm. 2. Small pericardial and pleural effusions. 3. Mild interstitial edema. Electronically Signed   By: Corlis Leak M.D.   On: 09/14/2015 20:16   I have personally reviewed and evaluated these images and lab results as part of my medical decision-making.  ED ECG REPORT   Date: 09/14/2015  Rate: 107  Rhythm: sinus tachycardia  QRS Axis: left  Intervals: normal  ST/T Wave abnormalities: none  Conduction Disutrbances:none  Narrative Interpretation: Sinus tachycardia, flattening of T-waves in I, aVL is nonspecific. Poor R-wave progression over precordial leads.  Old EKG Reviewed: none available  I have personally reviewed the EKG tracing and agree with the computerized printout as noted.   MDM   Final diagnoses:  Shortness of breath  Acute systolic heart failure (HCC)    54 y.o. female presents with shortness  of breath over the last week. With activity her shortness of breath is worse. She does not appear to have positional symptoms. She has had no chest pain. She is diabetic and has had long-standing hypertension. Her arrival EKG has sinus tachycardia with multiple nonspecific findings and no evidence of acute ischemia. Concerned with tachycardia and shortness of breath that she is low risk for PE but cannot rule out clinically. D-dimer elevated and will pursue CT scan to rule out PE, no embolus was identified.   Bedside  ultrasound demonstrates a dilated cardiomyopathy with estimation of severely depressed left ventricular ejection fraction consistent with systolic heart failure. Patient has no history of this previously. Pt was given dose of IV Lasix to begin diuresis here. Elevated BNP and elevation of troponin confirms suspicion of heart failure. Patient will require serial biomarkers to ensure she does not have ongoing ischemia as well as further workup for undifferentiated heart failure. Given full dose of aspirin here. Hospitalist was consulted for admission and accepted the patient in transfer to facility capable of caring for patient further. Hospitalist was consulted for admission and accepted the patient in transfer to facility capable of caring for patient further with high likelihood of cardiology consultation.   After further review patient states that she did have a brief feeling that food wasn't passing while she was eating cereal around a week ago that resolved after Tums. No current chest pain or other acute symptoms.   Lyndal Pulley, MD 09/14/15 2252

## 2015-09-14 NOTE — ED Notes (Signed)
Pt. returned from XR. 

## 2015-09-14 NOTE — ED Notes (Signed)
Pt transported to XR.  

## 2015-09-14 NOTE — H&P (Signed)
[facility] History and Physical  Laxmi A Schafer ZOX:[id number] DOB: [birth year] DOA: 09/14/2015  Referring physician: Lyndal Pulley, M.D. PCP: Junious Silk, MD   Chief Complaint: Shortness of breath.  HPI: Robin Owen is a 54 y.o. female with a past medical history of type 2 diabetes, hypertension, fibromyalgia, hypothyroidism, hyperlipidemia who presented to the emergency department at [facility] due to complaints of progressively worse dyspnea for about a week which was preceded by an episode of indigestion. She denies having chest pain, palpitations, dizziness, diaphoresis, PND, orthopnea or pitting edema of the lower extremities. She states that she has been able to perform her daily routines, but with significant fatigue. Today, since the symptoms have persisted, he was suggested to her to come to the emergency department. ER echocardiogram and BNP level are consistent with acute CHF.   Review of Systems:  Constitutional:  No weight loss, night sweats, Fevers, chills, fatigue.  HEENT:  No headaches, Difficulty swallowing,Tooth/dental problems,Sore throat,  No sneezing, itching, ear ache, nasal congestion, post nasal drip,  Cardio-vascular:  No chest pain, Orthopnea, PND, swelling in lower extremities, anasarca, dizziness, palpitations  GI:  Positive for heartburn, indigestion about a week ago. No abdominal pain, nausea, vomiting, diarrhea, change in bowel habits, loss of appetite  Resp:  Positive dyspnea worsened by exertion. She denies cough, hemoptysis or wheezing. Skin:  no rash or lesions.  GU:  no dysuria, change in color of urine, no urgency or frequency. No flank pain.  Musculoskeletal:  Chronic myalgias, arthralgias and back pain.  Psych:  No change in mood or affect. No depression or anxiety. No memory loss.   Past Medical History  Diagnosis Date  . Diabetes mellitus without complication (HCC)   . Hypertension   . Fibromyalgia   . Hypothyroidism   .  Hyperlipidemia    Past Surgical History  Procedure Laterality Date  . Endometrial ablation  2012   Social History:  reports that she has never smoked. She has never used smokeless tobacco. She reports that she does not drink alcohol or use illicit drugs.  Allergies no known allergies  Family History  Problem Relation Age of Onset  . Pulmonary fibrosis Mother   . Hypertension Mother   . Atrial fibrillation Mother   . Hypertension Father   . Diabetes Mellitus II Father   . Kidney disease Father   . Diabetes Mellitus II Brother   . Hypertension Brother   . Atrial fibrillation Maternal Grandmother   . Hypertension Maternal Grandmother   . Stroke Maternal Grandmother      Prior to Admission medications   Not on File   Physical Exam: Filed Vitals:   09/14/15 2107 09/14/15 2130 09/14/15 2144 09/14/15 2241  BP: 140/99 142/101 149/101 138/92  Pulse: 100 103 106   Temp:    98.1 F (36.7 C)  TempSrc:    Oral  Resp: 20   16  Height:     (1.651 m)  Weight:    77.747 kg (171 lb 6.4 oz)  SpO2: 100% 93% 96% 98%    Wt Readings from Last 3 Encounters:  09/14/15 77.747 kg (171 lb 6.4 oz)  06/01/09 75.206 kg (165 lb 12.8 oz)  05/20/09 73.846 kg (162 lb 12.8 oz)    General:  Appears calm and comfortable Eyes: PERRL, normal lids, irises & conjunctiva ENT: grossly normal hearing, lips & tongue Neck: no LAD, masses or thyromegaly Cardiovascular: RRR, no m/r/g. No LE edema. Telemetry: SCAIRO LINGENFELTERthmias  Respiratory: Rales  bilaterally, no wheezing or rhonchi. Normal respiratory effort. Abdomen: soft, ntnd Skin: no rash or induration seen on limited exam Musculoskeletal: grossly normal tone BUE/BLE Psychiatric: grossly normal mood and affect, speech fluent and appropriate Neurologic: grossly non-focal.          Labs on Admission:  Basic Metabolic Panel:  Recent Labs Lab 09/14/15 1820  NA 136  K 3.5  CL 99*  CO2 27  GLUCOSE 97  BUN 17  CREATININE 1.14*  CALCIUM  9.1   Liver Function Tests: CBC:  Recent Labs Lab 09/14/15 1820  WBC 12.4*  NEUTROABS 9.2*  HGB 11.6*  HCT 35.8*  MCV 88.0  PLT 385   Cardiac Enzymes:  Recent Labs Lab 09/14/15 1820  TROPONINI 0.09*    BNP (last 3 results)  Recent Labs  09/14/15 1820  BNP 1819.1*     CBG:  Recent Labs Lab 09/14/15 1818  GLUCAP 93    Radiological Exams on Admission: Dg Chest 2 View  09/14/2015  CLINICAL DATA:  Shortness of breath on exertion for 1 day. History of diabetes, hypertension and smoking. Initial encounter. EXAM: CHEST  2 VIEW COMPARISON:  05/20/2009. FINDINGS: Significant interval increase in the heart size, now moderately enlarged. The mediastinal contours are stable. There is new vascular congestion without overt pulmonary edema, confluent airspace opacity or significant pleural effusion. The bones appear unchanged. IMPRESSION: Interval significant enlargement of the cardiac silhouette, likely reflecting cardiomegaly with associated vascular congestion. A pericardial effusion would be a consideration. Electronically Signed   By: Carey Bullocks M.D.   On: 09/14/2015 18:59   Ct Angio Chest Pe W/cm &/or Wo Cm  09/14/2015  CLINICAL DATA:  Pt getting ekg Pt co SOB x 1 wk, getting worse, elevated d-dimer EXAM: CT ANGIOGRAPHY CHEST WITH CONTRAST TECHNIQUE: Multidetector CT imaging of the chest was performed using the standard protocol during bolus administration of intravenous contrast. Multiplanar CT image reconstructions and MIPs were obtained to evaluate the vascular anatomy. CONTRAST:  OMNIPAQUE IOHEXOL 350 MG/ML SOLN COMPARISON:  None. FINDINGS: Left arm IV contrast injection. Innominate vein and SVC patent. Reflux of contrast from right atrium into central aspect of hepatic veins. RV nondilated. Satisfactory opacification of pulmonary arteries noted, and there is no evidence of pulmonary emboli. Patent pulmonary veins. Incomplete opacification of the thoracic aorta  limiting evaluation for dissection. Classic 3 vessel brachiocephalic arterial origin anatomy. No significant atheromatous calcified plaque nor aneurysm. Trace bilateral pleural effusions. Trace pericardial effusion. No hilar or mediastinal adenopathy. Mild peripheral septal prominence in both lungs. No confluent airspace disease. Small amount of fluid in the interlobar fissures. Thoracic spine and sternum intact. Visualized portions of upper abdomen unremarkable. Review of the MIP images confirms the above findings. IMPRESSION: 1. Negative for acute PE or thoracic aortic aneurysm. 2. Small pericardial and pleural effusions. 3. Mild interstitial edema. Electronically Signed   By: Corlis Leak M.D.   On: 09/14/2015 20:16    EKG: Independently reviewed. Vent. rate 105 BPM PR interval 136 ms QRS duration 84 ms QT/QTc 354/467 ms P-R-T axes 42 -33 102 Sinus tachycardia Possible Left atrial enlargement Left axis deviation Anterior infarct , age undetermined Abnormal ECG  Assessment/Plan Principal Problem:   Acute systolic CHF (congestive heart failure) (HCC) It is possible, that the patient's indigestion symptoms that occurred a week ago, may have been due to cardiac ischemia.  Admit to telemetry monitoring. Serial troponin levels measuring. Start IV furosemide. Start low-dose carvedilol and continue lisinopril. Monitor input and output. Hold Pioglitazone.  Check echocardiogram Cardiology consult in the morning.  Active Problems:   Hyperlipidemia Check lipid panel. Continue atorvastatin and monitor LFTs periodically.    Hypertension Continue current lisinopril, hold hydrochlorothiazide and start low-dose carvedilol. Monitor blood pressure.    Fibromyalgia Continue cyclobenzaprine, tramadol    Hypothyroidism Continue levothyroxine and monitor TSH periodically.  Type 2 diabetes CBG monitoring with regular insulin sliding scale while in the hospital Continue metformin,liraglutide and  hold Actos due to CHF.  Code Status: Full code. DVT Prophylaxis: Lovenox SQ. Family Communication: The patient husband was present in the room. Disposition Plan: Admit to telemetry monitoring, check echocardiogram and cycle troponins.  Time spent: Over 70 minutes were spent during the process of this admission.  Bobette Mo [facility] Pager [phone].

## 2015-09-14 NOTE — ED Notes (Signed)
Pt made aware that urine specimen is needed. Pt unable to urinate at this time.

## 2015-09-14 NOTE — ED Notes (Signed)
Pt reports progressive SOB since last week- reports dyspnea with exertion- denies chest pain

## 2015-09-14 NOTE — Progress Notes (Signed)
54 yo F with SOB.  Found to have new onset CHF in ED today.  Reduced EF on bedside echo.  Going to tele.

## 2015-09-15 ENCOUNTER — Ambulatory Visit (HOSPITAL_BASED_OUTPATIENT_CLINIC_OR_DEPARTMENT_OTHER): Payer: BLUE CROSS/BLUE SHIELD

## 2015-09-15 DIAGNOSIS — E038 Other specified hypothyroidism: Secondary | ICD-10-CM | POA: Diagnosis not present

## 2015-09-15 DIAGNOSIS — I509 Heart failure, unspecified: Secondary | ICD-10-CM | POA: Diagnosis not present

## 2015-09-15 DIAGNOSIS — I429 Cardiomyopathy, unspecified: Secondary | ICD-10-CM | POA: Diagnosis not present

## 2015-09-15 DIAGNOSIS — I420 Dilated cardiomyopathy: Secondary | ICD-10-CM | POA: Diagnosis not present

## 2015-09-15 DIAGNOSIS — M797 Fibromyalgia: Secondary | ICD-10-CM

## 2015-09-15 DIAGNOSIS — I5021 Acute systolic (congestive) heart failure: Secondary | ICD-10-CM

## 2015-09-15 DIAGNOSIS — E785 Hyperlipidemia, unspecified: Secondary | ICD-10-CM | POA: Diagnosis not present

## 2015-09-15 DIAGNOSIS — I10 Essential (primary) hypertension: Secondary | ICD-10-CM

## 2015-09-15 LAB — BASIC METABOLIC PANEL
Anion gap: 11 (ref 5–15)
BUN: 13 mg/dL (ref 6–20)
CO2: 25 mmol/L (ref 22–32)
Calcium: 9.2 mg/dL (ref 8.9–10.3)
Chloride: 102 mmol/L (ref 101–111)
Creatinine, Ser: 1.05 mg/dL — ABNORMAL HIGH (ref 0.44–1.00)
GFR calc Af Amer: >60 mL/min (ref >60)
GFR calc non Af Amer: >60 mL/min (ref >60)
Glucose, Bld: 135 mg/dL — ABNORMAL HIGH (ref 65–99)
Potassium: 3.7 mmol/L (ref 3.5–5.1)
Sodium: 138 mmol/L (ref 135–145)

## 2015-09-15 LAB — COMPREHENSIVE METABOLIC PANEL
ALT: 154 U/L — ABNORMAL HIGH (ref 14–54)
AST: 147 U/L — ABNORMAL HIGH (ref 15–41)
Albumin: 3.7 g/dL (ref 3.5–5.0)
Alkaline Phosphatase: 75 U/L (ref 38–126)
Anion gap: 10 (ref 5–15)
BUN: 15 mg/dL (ref 6–20)
CO2: 28 mmol/L (ref 22–32)
Calcium: 9 mg/dL (ref 8.9–10.3)
Chloride: 102 mmol/L (ref 101–111)
Creatinine, Ser: 1.19 mg/dL — ABNORMAL HIGH (ref 0.44–1.00)
GFR calc Af Amer: >60 mL/min (ref >60)
GFR calc non Af Amer: 52 mL/min — ABNORMAL LOW (ref >60)
Glucose, Bld: 191 mg/dL — ABNORMAL HIGH (ref 65–99)
Potassium: 3.7 mmol/L (ref 3.5–5.1)
Sodium: 140 mmol/L (ref 135–145)
Total Bilirubin: 0.6 mg/dL (ref 0.3–1.2)
Total Protein: 6.7 g/dL (ref 6.5–8.1)

## 2015-09-15 LAB — TROPONIN I
Troponin I: 0.1 ng/mL — ABNORMAL HIGH (ref <0.031)
Troponin I: 0.1 ng/mL — ABNORMAL HIGH (ref <0.031)
Troponin I: 0.08 ng/mL — ABNORMAL HIGH (ref <0.031)

## 2015-09-15 LAB — PROTIME-INR
INR: 1.17 (ref 0.00–1.49)
Prothrombin Time: 15.1 seconds (ref 11.6–15.2)

## 2015-09-15 LAB — CBC WITH DIFFERENTIAL/PLATELET
Basophils Absolute: 0 10*3/uL (ref 0.0–0.1)
Basophils Relative: 0 %
Eosinophils Absolute: 0.2 10*3/uL (ref 0.0–0.7)
Eosinophils Relative: 2 %
HCT: 37 % (ref 36.0–46.0)
Hemoglobin: 12 g/dL (ref 12.0–15.0)
Lymphocytes Relative: 21 %
Lymphs Abs: 2.3 10*3/uL (ref 0.7–4.0)
MCH: 28.8 pg (ref 26.0–34.0)
MCHC: 32.4 g/dL (ref 30.0–36.0)
MCV: 88.7 fL (ref 78.0–100.0)
Monocytes Absolute: 0.7 10*3/uL (ref 0.1–1.0)
Monocytes Relative: 6 %
Neutro Abs: 7.6 10*3/uL (ref 1.7–7.7)
Neutrophils Relative %: 71 %
Platelets: 348 10*3/uL (ref 150–400)
RBC: 4.17 MIL/uL (ref 3.87–5.11)
RDW: 15.6 % — ABNORMAL HIGH (ref 11.5–15.5)
WBC: 10.8 10*3/uL — ABNORMAL HIGH (ref 4.0–10.5)

## 2015-09-15 LAB — BRAIN NATRIURETIC PEPTIDE: B Natriuretic Peptide: 2354.7 pg/mL — ABNORMAL HIGH (ref 0.0–100.0)

## 2015-09-15 LAB — GLUCOSE, CAPILLARY
Glucose-Capillary: 211 mg/dL — ABNORMAL HIGH (ref 65–99)
Glucose-Capillary: 129 mg/dL — ABNORMAL HIGH (ref 65–99)
Glucose-Capillary: 126 mg/dL — ABNORMAL HIGH (ref 65–99)
Glucose-Capillary: 136 mg/dL — ABNORMAL HIGH (ref 65–99)

## 2015-09-15 LAB — CBC
HCT: 34.4 % — ABNORMAL LOW (ref 36.0–46.0)
Hemoglobin: 11.3 g/dL — ABNORMAL LOW (ref 12.0–15.0)
MCH: 29 pg (ref 26.0–34.0)
MCHC: 32.8 g/dL (ref 30.0–36.0)
MCV: 88.2 fL (ref 78.0–100.0)
Platelets: 323 10*3/uL (ref 150–400)
RBC: 3.9 MIL/uL (ref 3.87–5.11)
RDW: 15.5 % (ref 11.5–15.5)
WBC: 9 10*3/uL (ref 4.0–10.5)

## 2015-09-15 LAB — MAGNESIUM: Magnesium: 1.7 mg/dL (ref 1.7–2.4)

## 2015-09-15 LAB — PHOSPHORUS: Phosphorus: 4.7 mg/dL — ABNORMAL HIGH (ref 2.5–4.6)

## 2015-09-15 MED ORDER — LIRAGLUTIDE 18 MG/3ML ~~LOC~~ SOPN: 1.8000 mg | PEN_INJECTOR | Freq: Every day | SUBCUTANEOUS | Status: DC

## 2015-09-15 MED ORDER — FUROSEMIDE 10 MG/ML IJ SOLN: 40.0000 mg | Freq: Two times a day (BID) | INTRAMUSCULAR | Status: DC

## 2015-09-15 MED ORDER — METFORMIN HCL 500 MG PO TABS: 1000.0000 mg | ORAL_TABLET | Freq: Two times a day (BID) | ORAL | Status: DC

## 2015-09-15 MED ORDER — CYCLOBENZAPRINE HCL 10 MG PO TABS
10.0000 mg | ORAL_TABLET | Freq: Two times a day (BID) | ORAL | Status: DC | PRN
  Administered 2015-09-15 – 2015-09-16 (×2): 10 mg via ORAL
  Filled 2015-09-15 (×2): qty 1

## 2015-09-15 MED ORDER — CYCLOBENZAPRINE HCL 10 MG PO TABS
10.0000 mg | ORAL_TABLET | Freq: Once | ORAL | Status: AC
  Administered 2015-09-15: 10 mg via ORAL
  Filled 2015-09-15: qty 1

## 2015-09-15 MED ORDER — FUROSEMIDE 10 MG/ML IJ SOLN
40.0000 mg | Freq: Once | INTRAMUSCULAR | Status: AC
  Administered 2015-09-15: 40 mg via INTRAVENOUS
  Filled 2015-09-15: qty 4

## 2015-09-15 MED ORDER — TRAMADOL HCL 50 MG PO TABS
50.0000 mg | ORAL_TABLET | Freq: Four times a day (QID) | ORAL | Status: DC | PRN
  Administered 2015-09-15 – 2015-09-16 (×3): 50 mg via ORAL
  Filled 2015-09-15 (×4): qty 1

## 2015-09-15 MED ORDER — LEVOTHYROXINE SODIUM 88 MCG PO TABS
88.0000 ug | ORAL_TABLET | Freq: Every day | ORAL | Status: DC
  Administered 2015-09-15 – 2015-09-17 (×3): 88 ug via ORAL
  Filled 2015-09-15 (×3): qty 1

## 2015-09-15 MED ORDER — CARVEDILOL 3.125 MG PO TABS: 3.1250 mg | ORAL_TABLET | Freq: Two times a day (BID) | ORAL | Status: DC

## 2015-09-15 MED ORDER — GABAPENTIN 600 MG PO TABS
600.0000 mg | ORAL_TABLET | Freq: Three times a day (TID) | ORAL | Status: DC
  Administered 2015-09-15 – 2015-09-16 (×5): 600 mg via ORAL
  Filled 2015-09-15 (×5): qty 1

## 2015-09-15 MED ORDER — FLUOXETINE HCL 20 MG PO TABS
20.0000 mg | ORAL_TABLET | Freq: Every day | ORAL | Status: DC
  Administered 2015-09-15 – 2015-09-17 (×3): 20 mg via ORAL
  Filled 2015-09-15 (×6): qty 1

## 2015-09-15 MED ORDER — ONDANSETRON HCL 4 MG PO TABS: 4.0000 mg | ORAL_TABLET | Freq: Three times a day (TID) | ORAL | Status: DC | PRN

## 2015-09-15 MED ORDER — ATORVASTATIN CALCIUM 10 MG PO TABS
10.0000 mg | ORAL_TABLET | Freq: Every day | ORAL | Status: DC
  Administered 2015-09-15 – 2015-09-16 (×2): 10 mg via ORAL
  Filled 2015-09-15 (×2): qty 1

## 2015-09-15 NOTE — Progress Notes (Signed)
Heart Failure Navigator Consult Note  Presentation: Robin Owen is a 54 y.o. female with a past medical history of type 2 diabetes, hypertension, fibromyalgia, hypothyroidism, hyperlipidemia who presented to the emergency department at [facility] due to complaints of progressively worse dyspnea for about a week which was preceded by an episode of indigestion. She denies having chest pain, palpitations, dizziness, diaphoresis, PND, orthopnea or pitting edema of the lower extremities. She states that she has been able to perform her daily routines, but with significant fatigue. Today, since the symptoms have persisted, he was suggested to her to come to the emergency department. ER echocardiogram and BNP level are consistent with acute CHF.   Past Medical History  Diagnosis Date  . Diabetes mellitus without complication (HCC)   . Hypertension   . Fibromyalgia   . Hypothyroidism   . Hyperlipidemia     Social History   Social History  . Marital Status: Married    Spouse Name: N/A  . Number of Children: N/A  . Years of Education: N/A   Social History Main Topics  . Smoking status: Never Smoker   . Smokeless tobacco: Never Used  . Alcohol Use: No  . Drug Use: No  . Sexual Activity: Yes    Birth Control/ Protection: None   Other Topics Concern  . None   Social History Narrative  . None    ECHO:Study Conclusions--09/15/15  - Left ventricle: The cavity size was severely dilated. Wall thickness was normal. The estimated ejection fraction was 20%. Diffuse hypokinesis. - Mitral valve: There was mild regurgitation. - Left atrium: The atrium was mildly dilated. - Pulmonary arteries: PA peak pressure: 37 mm Hg (S). - Pericardium, extracardiac: A trivial pericardial effusion was identified posterior to the heart.  Impressions:  - Global L. strain =-7.8.  Transthoracic echocardiography. M-mode, complete 2D, spectral Doppler, and color Doppler. Birthdate: Patient  birthdate: [birth year]. Age: Patient is 54 yr old. Sex: Gender: female. BMI: 27.6 kg/m^2. Blood pressure:   133/89 Patient status: Inpatient. Study date: Study date: 09/15/2015. Study time: 10:58 AM. Location: Echo laboratory.  -------------------------------------------------------------------  ------------------------------------------------------------------- Left ventricle: The cavity size was severely dilated. Wall thickness was normal. The estimated ejection fraction was 20%. Diffuse hypokinesis. BNP    Component Value Date/Time   BNP 2354.7* 09/15/2015 0542    ProBNP No results found for: PROBNP   Education Assessment and Provision:  Detailed education and instructions provided on heart failure disease management including the following:  Signs and symptoms of Heart Failure When to call the physician Importance of daily weights Low sodium diet Fluid restriction Medication management Anticipated future follow-up appointments  Patient education given on each of the above topics.  Patient acknowledges understanding and acceptance of all instructions.  I spoke briefly with Ms. Garron regarding her new diagnosis of HF.  She is somewhat overwhelmed and admits that she is trying to "absorb all new information".  I reviewed the importance of daily weights and how weight increases relate to the signs and symptoms of HF.  She says that she currently eats a diet in low sodium.  I reviewed a low sodium diet and high sodium foods to avoid.  She denies any issues with getting or taking prescribed medications.  I will plan to return to reinforce education at a later time.  She will likely follow with [facility] after discharge.  Education Materials:  "Living Better With Heart Failure" Booklet, Daily Weight Tracker Tool .   High Risk Criteria for Readmission and/or  Poor Patient Outcomes:   EF <30%- yes 20% new HF  2 or more admissions in 6 months- No  Difficult  social situation- No  Demonstrates medication noncompliance- No    Barriers of Care:  New HF, Knowledge of HF and compliance  Discharge Planning:   Plans to return home with her husband.  She will need ongoing education and compliance reinforcement.

## 2015-09-15 NOTE — Consult Note (Addendum)
CARDIOLOGY CONSULT NOTE   Patient ID: KAILANI BRASS MRN: [id number], DOB/AGE: 1961-02-22   Admit date: 09/14/2015 Date of Consult: 09/15/2015   Primary Physician: Junious Silk, MD Primary Cardiologist: new  Pt. Profile  54 year old Caucasian female with past medical history of HTN, HLD, DMII, hypothyroidism and fibromyalgia presented with 1 week onset of increasing DOE and found to have EF 20% on echo which is new.   Problem List  Past Medical History  Diagnosis Date  . Diabetes mellitus without complication (HCC)   . Hypertension   . Fibromyalgia   . Hypothyroidism   . Hyperlipidemia     Past Surgical History  Procedure Laterality Date  . Endometrial ablation  2012     Allergies  No Known Allergies  HPI   The patient is a pleasant 54 year old Caucasian female with past medical history of HTN, HLD, DMII, hypothyroidism and fibromyalgia. She has no past cardiac history. Her father was recently diagnosed with heart failure in his 72s and her mother had a history of atrial fibrillation, however no significant family history of early CAD.   She was essentially in her usual state of health until she began to experience increasing exertional dyspnea in the last week. She did have an episode of "indigestion" feeling after food which lasted roughly 35-45 minutes last week however it is relieved with TUMs and has not recurred again. She has been compliant with her blood pressure medication most of the time, and the highest blood pressure she has experienced was around 140s systolic. She denies any prior history of chemotherapy, recent viral infection, alcohol abuse, hyperthyroidism, or chronically uncontrolled hypertension. Her symptom eventually worsened to the degree that she would have shortness of breath with minimal exertion. She denies any resting dyspnea, orthopnea or paroxysmal nocturnal dyspnea. She denies any recent lower extremity edema.   She eventually decided  to seek medical attention in the ED, bedside ultrasound showed global hypokinesis with low EF. She was admitted to internal medicine service. Significant finding on arrival include BNP 1800. Troponin mildly elevated at 0.09 --> 0.10 --> 0.10. White blood cell count 12.4. Hemoglobin 11.6. Creatinine 1.14. Chest x-ray confirms vascular congestion and cardiomegaly. EKG showed sinus tachycardia with poor R wave progression in the anterior lead. Initial d-dimer was elevated at 1.72. CTA of the chest was obtained which was negative for PE, however did confirm mild interstitial edema, small pericardial and pleural effusion. She was started on IV diuresis with Lasix. A formal echocardiogram was obtained on 09/15/2015 which showed EF 20%, mild MR, PA peak pressure 37, trivial pericardial effusion. Of note, patient does admit to be under significant amount of stress, however did not see obvious apical hypokinesis on echocardiogram. Cardiology has been consulted for newly diagnosed LV dysfunction.  Of note, she also endorsed abdominal distention recently, however the distention has improved after starting IV Lasix.   Inpatient Medications  . aspirin EC  81 mg Oral Daily  . atorvastatin  10 mg Oral q1800  . enoxaparin (LOVENOX) injection  40 mg Subcutaneous Daily  . FLUoxetine  20 mg Oral Daily  . furosemide  40 mg Intravenous BID  . gabapentin  600 mg Oral TID  . insulin aspart  0-9 Units Subcutaneous TID WC  . levothyroxine  88 mcg Oral QAC breakfast  . Liraglutide  1.8 mg Subcutaneous Daily  . lisinopril  20 mg Oral Daily  . potassium chloride  20 mEq Oral BID  . sodium chloride  3 mL Intravenous Q12H  Family History Family History  Problem Relation Age of Onset  . Pulmonary fibrosis Mother   . Hypertension Mother   . Atrial fibrillation Mother   . Hypertension Father   . Diabetes Mellitus II Father   . Kidney disease Father   . Diabetes Mellitus II Brother   . Hypertension Brother   .  Atrial fibrillation Maternal Grandmother   . Hypertension Maternal Grandmother   . Stroke Maternal Grandmother      Social History Social History   Social History  . Marital Status: Married    Spouse Name: N/A  . Number of Children: N/A  . Years of Education: N/A   Occupational History  . Not on file.   Social History Main Topics  . Smoking status: Never Smoker   . Smokeless tobacco: Never Used  . Alcohol Use: No  . Drug Use: No  . Sexual Activity: Yes    Birth Control/ Protection: None   Other Topics Concern  . Not on file   Social History Narrative  . No narrative on file     Review of Systems  General:  No chills, fever, night sweats or weight changes.  Cardiovascular:  No chest pain, edema, orthopnea, palpitations, paroxysmal nocturnal dyspnea. +dyspnea on exertion Dermatological: No rash, lesions/masses Respiratory: No cough, dyspnea Urologic: No hematuria, dysuria Abdominal:   No nausea, vomiting, diarrhea, bright red blood per rectum, melena, or hematemesis +abdominal distension. Neurologic:  No visual changes, wkns, changes in mental status. All other systems reviewed and are otherwise negative except as noted above.  Physical Exam  Blood pressure 117/70, pulse 98, temperature 98.6 F (37 C), temperature source Oral, resp. rate 18, height 5\' 5"  (1.651 m), weight 166 lb 12.8 oz (75.66 kg), SpO2 95 %.  General: Pleasant, NAD Psych: Normal affect. Neuro: Alert and oriented X 3. Moves all extremities spontaneously. HEENT: Normal  Neck: Supple without bruits  +mildly elevated JVD. Lungs:  Resp regular and unlabored. Mildly decreased breath sound in bilateral bases, more notable in R base Heart: RRR no s3, s4, or murmurs. Abdomen: Soft, non-tender, non-distended, BS + x 4.  Extremities: No clubbing, cyanosis or edema. DP/PT/Radials 2+ and equal bilaterally.  Labs   Recent Labs  09/14/15 1820 09/15/15 0031 09/15/15 0542 09/15/15 1227  TROPONINI 0.09*  0.10* 0.10* 0.08*   Lab Results  Component Value Date   WBC 10.8* 09/15/2015   HGB 12.0 09/15/2015   HCT 37.0 09/15/2015   MCV 88.7 09/15/2015   PLT 348 09/15/2015     Recent Labs Lab 09/15/15 0542  NA 140  K 3.7  CL 102  CO2 28  BUN 15  CREATININE 1.19*  CALCIUM 9.0  PROT 6.7  BILITOT 0.6  ALKPHOS 75  ALT 154*  AST 147*  GLUCOSE 191*   No results found for: CHOL, HDL, LDLCALC, TRIG Lab Results  Component Value Date   DDIMER 1.72* 09/14/2015    Radiology/Studies  Dg Chest 2 View  09/14/2015  CLINICAL DATA:  Shortness of breath on exertion for 1 day. History of diabetes, hypertension and smoking. Initial encounter. EXAM: CHEST  2 VIEW COMPARISON:  05/20/2009. FINDINGS: Significant interval increase in the heart size, now moderately enlarged. The mediastinal contours are stable. There is new vascular congestion without overt pulmonary edema, confluent airspace opacity or significant pleural effusion. The bones appear unchanged. IMPRESSION: Interval significant enlargement of the cardiac silhouette, likely reflecting cardiomegaly with associated vascular congestion. A pericardial effusion would be a consideration. Electronically Signed  By: Carey Bullocks M.D.   On: 09/14/2015 18:59   Ct Angio Chest Pe W/cm &/or Wo Cm  09/14/2015  CLINICAL DATA:  Pt getting ekg Pt co SOB x 1 wk, getting worse, elevated d-dimer EXAM: CT ANGIOGRAPHY CHEST WITH CONTRAST TECHNIQUE: Multidetector CT imaging of the chest was performed using the standard protocol during bolus administration of intravenous contrast. Multiplanar CT image reconstructions and MIPs were obtained to evaluate the vascular anatomy. CONTRAST:  OMNIPAQUE IOHEXOL 350 MG/ML SOLN COMPARISON:  None. FINDINGS: Left arm IV contrast injection. Innominate vein and SVC patent. Reflux of contrast from right atrium into central aspect of hepatic veins. RV nondilated. Satisfactory opacification of pulmonary arteries noted, and  there is no evidence of pulmonary emboli. Patent pulmonary veins. Incomplete opacification of the thoracic aorta limiting evaluation for dissection. Classic 3 vessel brachiocephalic arterial origin anatomy. No significant atheromatous calcified plaque nor aneurysm. Trace bilateral pleural effusions. Trace pericardial effusion. No hilar or mediastinal adenopathy. Mild peripheral septal prominence in both lungs. No confluent airspace disease. Small amount of fluid in the interlobar fissures. Thoracic spine and sternum intact. Visualized portions of upper abdomen unremarkable. Review of the MIP images confirms the above findings. IMPRESSION: 1. Negative for acute PE or thoracic aortic aneurysm. 2. Small pericardial and pleural effusions. 3. Mild interstitial edema. Electronically Signed   By: Corlis Leak M.D.   On: 09/14/2015 20:16    ECG  Normal sinus rhythm with poor R-wave progression in anterior leads. No obvious ST-T wave changes.  ASSESSMENT AND PLAN  1. Acute systolic HF with newly diagnosed LV dysfunction  - Unclear cause, no history of EtOH abuse, no history of chemotherapy, history of hypothyroidism noted, check TSH, does have hypertension, however systolic blood pressure has been relatively controlled. Current tachycardia is likely related to cardiomyopathy and heart acute heart failure.  - Will continue IV Lasix today, likely near euvolemic level, stop after midnight. Will discuss with M.D. regarding possible left and right heart cath tomorrow versus myoview  - Continue lisinopril, home HCTZ held. Once reached euvolemic level, will add carvedilol. Likely also had spironolactone prior to discharge.  2. Elevated d-dimer: CTA of chest negative for PE  3. Mildly AKI  4. HTN 5. HLD 6. DMII 7. Hypothyroidism: will check TSH tomorrow AM 8. Fibromyalgia  Signed, Azalee Course, PA-C 09/15/2015, 3:42 PM   The patient was seen, examined and discussed with Azalee Course, PA-C and I agree with the  above.   A very pleasant 54 year old female with h/o HTN, fibromyalgia, and with no prior cardiac history presented with exertional SOB that started a week ago and was progressively worsening. She denies any prior SOB or CP, she stays at home but have never noticed any limitations in doing ADL.  She has been through a lot of stress lately dealing with her sick father. Denies FH of premature CAD, CHF or SCD.  Her echo shows severely dilated LV with LVEF 20% and diffuse hypokinesis, mildly dilated LA. This is suggestive  Of a longstanding process that tipped over and she developed symptoms of CHF. 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 She has mildly elevated troponin with flat trend suggestive demand ischemia sec to CHF. However, we will schedule a left and right cath tomorrow, if no CAD, we will plan for a cardiac MRI.  She responded well to iv lasix, we will give additional lasix 40 mg iv x today as she is still fluid overloaded and reevaluate  in the morning, Normal Crea.   Lars Masson 09/15/2015

## 2015-09-15 NOTE — Progress Notes (Signed)
  Echocardiogram 2D Echocardiogram has been performed.  Nolon Rod 09/15/2015, 12:06 PM

## 2015-09-15 NOTE — Progress Notes (Signed)
[facility] Progress Note   Robin Owen  XMI:[id number]  DOB: 15-Sep-1961  DOA: 09/14/2015 PCP: Junious Silk, MD  Brief narrative: Robin Owen is a 54 y.o. female with DM 2, HTN, hypothyroidism, HLP and fibromyalgia presents to the hospital for dyspnea on exertion for 1 week. No pedal edema. Found to have pulmonary edema and admitted for further work up and treatment. No c/o chest pain or pressure.    Subjective: Dyspnea slightly improved. Without chest pain, cough, nausea or vomiting.   Assessment/Plan: Principal Problem:   Acute systolic CHF (congestive heart failure - no h/o CHF- ECHO reveals EF of 20% with global hypokinesis- already on ACE I- can start B Blocker after diuresed - Cardiology consulted  Active Problems: AKI - follow- might improve with diuresis  DM - cont sliding scale & victoza- hold Metformin  Mild troponin elevation - likely due to CHF    Hyperlipidemia - cont statin    Hypertension - cont Lisinopril- hold HCTZ    Fibromyalgia - cont Flexeril, Gabapentin and Tramadol    Hypothyroidism - cont Synthroid    Code Status:     Code Status Orders        Start     Ordered   09/14/15 2311  Full code   Continuous     09/14/15 2310     Family Communication:  Disposition Plan: home when stable DVT prophylaxis: Lovenox Consultants:cardiology  Procedures: 2 D ECHO Left ventricle: The cavity size was severely dilated. Wall thickness was normal. The estimated ejection fraction was 20%. Diffuse hypokinesis. - Mitral valve: There was mild regurgitation. - Left atrium: The atrium was mildly dilated. - Pulmonary arteries: PA peak pressure: 37 mm Hg (S). - Pericardium, extracardiac: A trivial pericardial effusion was identified posterior to the heart.   Antibiotics: Anti-infectives    None      Objective: Filed Weights   09/14/15 1802 09/14/15 2241 09/15/15 0352  Weight: 77.111 kg (170 lb) 77.747 kg (171 lb 6.4  oz) 75.66 kg (166 lb 12.8 oz)    Intake/Output Summary (Last 24 hours) at 09/15/15 1436 Last data filed at 09/15/15 1255  Gross per 24 hour  Intake    480 ml  Output   2150 ml  Net  -1670 ml     Vitals Filed Vitals:   09/14/15 2241 09/15/15 0352 09/15/15 0855 09/15/15 0956  BP: 138/92 133/89 119/85 117/70  Pulse:   98   Temp: 98.1 F (36.7 C) 98.2 F (36.8 C) 98.6 F (37 C)   TempSrc: Oral Oral Oral   Resp: 16 18 18    Height: 5\' 5"  (1.651 m)     Weight: 77.747 kg (171 lb 6.4 oz) 75.66 kg (166 lb 12.8 oz)    SpO2: 98% 98% 95%     Exam:  General:  Pt is alert, not in acute distress  HEENT: No icterus, No thrush, oral mucosa moist  Cardiovascular: regular rate and rhythm, S1/S2 No murmur  Respiratory: crackles at bases  Abdomen: Soft, +Bowel sounds, non tender, non distended, no guarding  MSK: No LE edema, cyanosis or clubbing  Data Reviewed: Basic Metabolic Panel:  Recent Labs Lab 09/14/15 1820 09/15/15 0031 09/15/15 0542  NA 136 138 140  K 3.5 3.7 3.7  CL 99* 102 102  CO2 27 25 28   GLUCOSE 97 135* 191*  BUN 17 13 15   CREATININE 1.14* 1.05* 1.19*  CALCIUM 9.1 9.2 9.0  MG  --  1.7  --   PHOS  --  4.7*  --    Liver Function Tests:  Recent Labs Lab 09/15/15 0542  AST 147*  ALT 154*  ALKPHOS 75  BILITOT 0.6  PROT 6.7  ALBUMIN 3.7   No results for input(s): LIPASE, AMYLASE in the last 168 hours. No results for input(s): AMMONIA in the last 168 hours. CBC:  Recent Labs Lab 09/14/15 1820 09/15/15 0031 09/15/15 0542  WBC 12.4* 9.0 10.8*  NEUTROABS 9.2*  --  7.6  HGB 11.6* 11.3* 12.0  HCT 35.8* 34.4* 37.0  MCV 88.0 88.2 88.7  PLT 385 323 348   Cardiac Enzymes:  Recent Labs Lab 09/14/15 1820 09/15/15 0031 09/15/15 0542 09/15/15 1227  TROPONINI 0.09* 0.10* 0.10* 0.08*   BNP (last 3 results)  Recent Labs  09/14/15 1820 09/15/15 0542  BNP 1819.1* 2354.7*    ProBNP (last 3 results) No results for input(s): PROBNP in the last  8760 hours.  CBG:  Recent Labs Lab 09/14/15 1818 09/15/15 0616 09/15/15 1254  GLUCAP 93 211* 129*    No results found for this or any previous visit (from the past 240 hour(s)).   Studies: Dg Chest 2 View  09/14/2015  CLINICAL DATA:  Shortness of breath on exertion for 1 day. History of diabetes, hypertension and smoking. Initial encounter. EXAM: CHEST  2 VIEW COMPARISON:  05/20/2009. FINDINGS: Significant interval increase in the heart size, now moderately enlarged. The mediastinal contours are stable. There is new vascular congestion without overt pulmonary edema, confluent airspace opacity or significant pleural effusion. The bones appear unchanged. IMPRESSION: Interval significant enlargement of the cardiac silhouette, likely reflecting cardiomegaly with associated vascular congestion. A pericardial effusion would be a consideration. Electronically Signed   By: Carey Bullocks M.D.   On: 09/14/2015 18:59   Ct Angio Chest Pe W/cm &/or Wo Cm  09/14/2015  CLINICAL DATA:  Pt getting ekg Pt co SOB x 1 wk, getting worse, elevated d-dimer EXAM: CT ANGIOGRAPHY CHEST WITH CONTRAST TECHNIQUE: Multidetector CT imaging of the chest was performed using the standard protocol during bolus administration of intravenous contrast. Multiplanar CT image reconstructions and MIPs were obtained to evaluate the vascular anatomy. CONTRAST:  OMNIPAQUE IOHEXOL 350 MG/ML SOLN COMPARISON:  None. FINDINGS: Left arm IV contrast injection. Innominate vein and SVC patent. Reflux of contrast from right atrium into central aspect of hepatic veins. RV nondilated. Satisfactory opacification of pulmonary arteries noted, and there is no evidence of pulmonary emboli. Patent pulmonary veins. Incomplete opacification of the thoracic aorta limiting evaluation for dissection. Classic 3 vessel brachiocephalic arterial origin anatomy. No significant atheromatous calcified plaque nor aneurysm. Trace bilateral pleural effusions.  Trace pericardial effusion. No hilar or mediastinal adenopathy. Mild peripheral septal prominence in both lungs. No confluent airspace disease. Small amount of fluid in the interlobar fissures. Thoracic spine and sternum intact. Visualized portions of upper abdomen unremarkable. Review of the MIP images confirms the above findings. IMPRESSION: 1. Negative for acute PE or thoracic aortic aneurysm. 2. Small pericardial and pleural effusions. 3. Mild interstitial edema. Electronically Signed   By: Corlis Leak M.D.   On: 09/14/2015 20:16    Scheduled Meds:  Scheduled Meds: . aspirin EC  81 mg Oral Daily  . atorvastatin  10 mg Oral q1800  . enoxaparin (LOVENOX) injection  40 mg Subcutaneous Daily  . FLUoxetine  20 mg Oral Daily  . furosemide  20 mg Intravenous BID  . gabapentin  600 mg Oral TID  . insulin aspart  0-9 Units Subcutaneous TID WC  .  levothyroxine  88 mcg Oral QAC breakfast  . Liraglutide  1.8 mg Subcutaneous Daily  . lisinopril  20 mg Oral Daily  . potassium chloride  20 mEq Oral BID  . sodium chloride  3 mL Intravenous Q12H   Continuous Infusions:   Time spent on care of this patient: 35 min   RIZWAN,SAIMA, MD 09/15/2015, 2:36 PM  LOS: 1 day   [facility] Office  [phone] Pager - Text Page per [email or web address] If 7PM-7AM, please contact night-coverage [email or web address]

## 2015-09-15 NOTE — Progress Notes (Signed)
Risk and benefit of procedure explained to the patient who display clear understanding and agree to proceed.  Discussed with patient possible procedural risk include but not limited to bleeding, vascular injury, renal injury, arrythmia, MI, stroke related complication.  Ramond Dial PA Pager: [phone]

## 2015-09-15 NOTE — Progress Notes (Signed)
Pharmacy does not have the Liraglutide (Victoza) pen. Patient or family will need to bring this medication from home. Will instruct patient and continue to monitor.

## 2015-09-16 ENCOUNTER — Encounter: Payer: Self-pay | Admitting: *Deleted

## 2015-09-16 ENCOUNTER — Observation Stay (HOSPITAL_COMMUNITY): Payer: BLUE CROSS/BLUE SHIELD

## 2015-09-16 ENCOUNTER — Encounter (HOSPITAL_COMMUNITY): Payer: Self-pay | Admitting: Cardiology

## 2015-09-16 ENCOUNTER — Encounter (HOSPITAL_COMMUNITY)
Admission: EM | Disposition: A | Discharge status: Home / Self Care | Payer: BLUE CROSS/BLUE SHIELD | Source: Home / Self Care | Attending: Internal Medicine

## 2015-09-16 DIAGNOSIS — Z006 Encounter for examination for normal comparison and control in clinical research program: Secondary | ICD-10-CM

## 2015-09-16 DIAGNOSIS — I429 Cardiomyopathy, unspecified: Secondary | ICD-10-CM | POA: Diagnosis not present

## 2015-09-16 DIAGNOSIS — E785 Hyperlipidemia, unspecified: Secondary | ICD-10-CM | POA: Diagnosis not present

## 2015-09-16 DIAGNOSIS — I5021 Acute systolic (congestive) heart failure: Secondary | ICD-10-CM | POA: Diagnosis not present

## 2015-09-16 DIAGNOSIS — I272 Pulmonary hypertension, unspecified: Secondary | ICD-10-CM

## 2015-09-16 DIAGNOSIS — I428 Other cardiomyopathies: Secondary | ICD-10-CM

## 2015-09-16 DIAGNOSIS — R0602 Shortness of breath: Secondary | ICD-10-CM

## 2015-09-16 DIAGNOSIS — I10 Essential (primary) hypertension: Secondary | ICD-10-CM | POA: Diagnosis not present

## 2015-09-16 DIAGNOSIS — E038 Other specified hypothyroidism: Secondary | ICD-10-CM | POA: Diagnosis not present

## 2015-09-16 HISTORY — PX: CARDIAC CATHETERIZATION: SHX172

## 2015-09-16 LAB — POCT I-STAT 3, VENOUS BLOOD GAS (G3P V)
Acid-Base Excess: 2 mmol/L (ref 0.0–2.0)
Bicarbonate: 27.1 mEq/L — ABNORMAL HIGH (ref 20.0–24.0)
O2 Saturation: 50 %
TCO2: 28 mmol/L (ref 0–100)
pCO2, Ven: 43.8 mmHg — ABNORMAL LOW (ref 45.0–50.0)
pH, Ven: 7.4 — ABNORMAL HIGH (ref 7.250–7.300)
pO2, Ven: 27 mmHg — CL (ref 30.0–45.0)

## 2015-09-16 LAB — BASIC METABOLIC PANEL
Anion gap: 12 (ref 5–15)
BUN: 16 mg/dL (ref 6–20)
CO2: 25 mmol/L (ref 22–32)
Calcium: 8.7 mg/dL — ABNORMAL LOW (ref 8.9–10.3)
Chloride: 100 mmol/L — ABNORMAL LOW (ref 101–111)
Creatinine, Ser: 1.13 mg/dL — ABNORMAL HIGH (ref 0.44–1.00)
GFR calc Af Amer: >60 mL/min (ref >60)
GFR calc non Af Amer: 55 mL/min — ABNORMAL LOW (ref >60)
Glucose, Bld: 130 mg/dL — ABNORMAL HIGH (ref 65–99)
Potassium: 3.4 mmol/L — ABNORMAL LOW (ref 3.5–5.1)
Sodium: 137 mmol/L (ref 135–145)

## 2015-09-16 LAB — POCT I-STAT 3, ART BLOOD GAS (G3+)
Acid-Base Excess: 2 mmol/L (ref 0.0–2.0)
Bicarbonate: 26.2 mEq/L — ABNORMAL HIGH (ref 20.0–24.0)
O2 Saturation: 98 %
TCO2: 27 mmol/L (ref 0–100)
pCO2 arterial: 38.2 mmHg (ref 35.0–45.0)
pH, Arterial: 7.444 (ref 7.350–7.450)
pO2, Arterial: 93 mmHg (ref 80.0–100.0)

## 2015-09-16 LAB — GLUCOSE, CAPILLARY
Glucose-Capillary: 147 mg/dL — ABNORMAL HIGH (ref 65–99)
Glucose-Capillary: 120 mg/dL — ABNORMAL HIGH (ref 65–99)
Glucose-Capillary: 147 mg/dL — ABNORMAL HIGH (ref 65–99)
Glucose-Capillary: 216 mg/dL — ABNORMAL HIGH (ref 65–99)

## 2015-09-16 LAB — CBC
HCT: 37.5 % (ref 36.0–46.0)
Hemoglobin: 12.5 g/dL (ref 12.0–15.0)
MCH: 29.1 pg (ref 26.0–34.0)
MCHC: 33.3 g/dL (ref 30.0–36.0)
MCV: 87.4 fL (ref 78.0–100.0)
Platelets: 334 10*3/uL (ref 150–400)
RBC: 4.29 MIL/uL (ref 3.87–5.11)
RDW: 15.8 % — ABNORMAL HIGH (ref 11.5–15.5)
WBC: 9.6 10*3/uL (ref 4.0–10.5)

## 2015-09-16 LAB — CREATININE, SERUM
Creatinine, Ser: 0.99 mg/dL (ref 0.44–1.00)
GFR calc Af Amer: >60 mL/min (ref >60)
GFR calc non Af Amer: >60 mL/min (ref >60)

## 2015-09-16 LAB — TSH: TSH: 1.99 u[IU]/mL (ref 0.350–4.500)

## 2015-09-16 SURGERY — RIGHT/LEFT HEART CATH AND CORONARY ANGIOGRAPHY
Anesthesia: LOCAL

## 2015-09-16 MED ORDER — SODIUM CHLORIDE 0.9 % IJ SOLN: 3.0000 mL | INTRAMUSCULAR | Status: DC | PRN

## 2015-09-16 MED ORDER — HEPARIN SODIUM (PORCINE) 1000 UNIT/ML IJ SOLN
INTRAMUSCULAR | Status: DC | PRN
  Administered 2015-09-16: 4000 [IU] via INTRAVENOUS

## 2015-09-16 MED ORDER — MIDAZOLAM HCL 2 MG/2ML IJ SOLN
INTRAMUSCULAR | Status: AC
  Filled 2015-09-16: qty 2

## 2015-09-16 MED ORDER — LIDOCAINE HCL (PF) 1 % IJ SOLN
INTRAMUSCULAR | Status: DC | PRN
  Administered 2015-09-16: 2 mL
  Administered 2015-09-16: 3 mL
  Administered 2015-09-16: 2 mL

## 2015-09-16 MED ORDER — LIDOCAINE HCL (PF) 1 % IJ SOLN
INTRAMUSCULAR | Status: AC
  Filled 2015-09-16: qty 30

## 2015-09-16 MED ORDER — ASPIRIN 81 MG PO CHEW
81.0000 mg | CHEWABLE_TABLET | ORAL | Status: AC
  Administered 2015-09-16: 81 mg via ORAL
  Filled 2015-09-16: qty 1

## 2015-09-16 MED ORDER — HEPARIN (PORCINE) IN NACL 2-0.9 UNIT/ML-% IJ SOLN
INTRAMUSCULAR | Status: AC
  Filled 2015-09-16: qty 1000

## 2015-09-16 MED ORDER — SODIUM CHLORIDE 0.9 % IV SOLN: INTRAVENOUS | Status: DC

## 2015-09-16 MED ORDER — SODIUM CHLORIDE 0.9 % IJ SOLN
3.0000 mL | Freq: Two times a day (BID) | INTRAMUSCULAR | Status: DC
  Administered 2015-09-16: 3 mL via INTRAVENOUS

## 2015-09-16 MED ORDER — CARVEDILOL 3.125 MG PO TABS
3.1250 mg | ORAL_TABLET | Freq: Two times a day (BID) | ORAL | Status: DC
  Administered 2015-09-16 – 2015-09-17 (×2): 3.125 mg via ORAL
  Filled 2015-09-16 (×3): qty 1

## 2015-09-16 MED ORDER — NITROGLYCERIN 1 MG/10 ML FOR IR/CATH LAB
INTRA_ARTERIAL | Status: AC
  Filled 2015-09-16: qty 10

## 2015-09-16 MED ORDER — GABAPENTIN 600 MG PO TABS: 600.0000 mg | ORAL_TABLET | Freq: Every day | ORAL | Status: DC

## 2015-09-16 MED ORDER — GADOBENATE DIMEGLUMINE 529 MG/ML IV SOLN
24.0000 mL | Freq: Once | INTRAVENOUS | Status: AC | PRN
  Administered 2015-09-16: 24 mL via INTRAVENOUS

## 2015-09-16 MED ORDER — FENTANYL CITRATE (PF) 100 MCG/2ML IJ SOLN
INTRAMUSCULAR | Status: DC | PRN
  Administered 2015-09-16: 25 ug via INTRAVENOUS

## 2015-09-16 MED ORDER — SODIUM CHLORIDE 0.9 % IV SOLN
INTRAVENOUS | Status: DC
  Administered 2015-09-16: 06:00:00 via INTRAVENOUS

## 2015-09-16 MED ORDER — GABAPENTIN 600 MG PO TABS
300.0000 mg | ORAL_TABLET | Freq: Two times a day (BID) | ORAL | Status: DC
  Administered 2015-09-17: 300 mg via ORAL
  Filled 2015-09-16 (×3): qty 1

## 2015-09-16 MED ORDER — POTASSIUM CHLORIDE CRYS ER 20 MEQ PO TBCR
40.0000 meq | EXTENDED_RELEASE_TABLET | Freq: Once | ORAL | Status: AC
  Administered 2015-09-16: 40 meq via ORAL
  Filled 2015-09-16: qty 2

## 2015-09-16 MED ORDER — FUROSEMIDE 10 MG/ML IJ SOLN
40.0000 mg | Freq: Once | INTRAMUSCULAR | Status: AC
  Administered 2015-09-16: 40 mg via INTRAVENOUS
  Filled 2015-09-16: qty 4

## 2015-09-16 MED ORDER — LIDOCAINE HCL (PF) 1 % IJ SOLN
INTRAMUSCULAR | Status: DC | PRN
  Administered 2015-09-16: 11:00:00

## 2015-09-16 MED ORDER — VERAPAMIL HCL 2.5 MG/ML IV SOLN
INTRAVENOUS | Status: DC | PRN
  Administered 2015-09-16: 10:00:00 via INTRA_ARTERIAL

## 2015-09-16 MED ORDER — SODIUM CHLORIDE 0.9 % IJ SOLN
3.0000 mL | Freq: Two times a day (BID) | INTRAMUSCULAR | Status: DC
  Administered 2015-09-16 – 2015-09-17 (×3): 3 mL via INTRAVENOUS

## 2015-09-16 MED ORDER — GABAPENTIN 600 MG PO TABS
600.0000 mg | ORAL_TABLET | Freq: Every day | ORAL | Status: DC
  Administered 2015-09-16: 600 mg via ORAL
  Filled 2015-09-16: qty 1

## 2015-09-16 MED ORDER — VERAPAMIL HCL 2.5 MG/ML IV SOLN
INTRAVENOUS | Status: AC
  Filled 2015-09-16: qty 2

## 2015-09-16 MED ORDER — FENTANYL CITRATE (PF) 100 MCG/2ML IJ SOLN
INTRAMUSCULAR | Status: AC
  Filled 2015-09-16: qty 2

## 2015-09-16 MED ORDER — SODIUM CHLORIDE 0.9 % IV SOLN: 250.0000 mL | INTRAVENOUS | Status: DC | PRN

## 2015-09-16 MED ORDER — IOHEXOL 350 MG/ML SOLN
INTRAVENOUS | Status: DC | PRN
  Administered 2015-09-16: 100 mL via INTRAVENOUS

## 2015-09-16 MED ORDER — ENOXAPARIN SODIUM 40 MG/0.4ML ~~LOC~~ SOLN
40.0000 mg | SUBCUTANEOUS | Status: DC
  Administered 2015-09-17: 40 mg via SUBCUTANEOUS
  Filled 2015-09-16: qty 0.4

## 2015-09-16 MED ORDER — MIDAZOLAM HCL 2 MG/2ML IJ SOLN
INTRAMUSCULAR | Status: DC | PRN
  Administered 2015-09-16: 1 mg via INTRAVENOUS

## 2015-09-16 MED ORDER — FUROSEMIDE 10 MG/ML IJ SOLN
20.0000 mg | Freq: Two times a day (BID) | INTRAMUSCULAR | Status: DC
  Administered 2015-09-17: 20 mg via INTRAVENOUS
  Filled 2015-09-16: qty 2

## 2015-09-16 MED ORDER — HEPARIN SODIUM (PORCINE) 1000 UNIT/ML IJ SOLN
INTRAMUSCULAR | Status: AC
  Filled 2015-09-16: qty 1

## 2015-09-16 SURGICAL SUPPLY — 13 items
CATH BALLN WEDGE 5F 110CM (CATHETERS) ×1 IMPLANT
CATH INFINITI 5 FR JL3.5 (CATHETERS) ×2 IMPLANT
CATH INFINITI 5FR ANG PIGTAIL (CATHETERS) ×2 IMPLANT
CATH INFINITI JR4 5F (CATHETERS) ×2 IMPLANT
DEVICE RAD COMP TR BAND LRG (VASCULAR PRODUCTS) ×2 IMPLANT
GLIDESHEATH SLEND SS 6F .021 (SHEATH) ×2 IMPLANT
KIT HEART LEFT (KITS) ×2 IMPLANT
PACK CARDIAC CATHETERIZATION (CUSTOM PROCEDURE TRAY) ×2 IMPLANT
SHEATH FAST CATH BRACH 5F 5CM (SHEATH) ×1 IMPLANT
SYR MEDRAD MARK V 150ML (SYRINGE) ×2 IMPLANT
TRANSDUCER W/STOPCOCK (MISCELLANEOUS) ×2 IMPLANT
TUBING CIL FLEX 10 FLL-RA (TUBING) ×2 IMPLANT
WIRE SAFE-T 1.5MM-J .035X260CM (WIRE) ×2 IMPLANT

## 2015-09-16 NOTE — Progress Notes (Signed)
Preliminary cardiac MRI read:  Severe LV dilatation with severe systolic dysfunction (LVEF 20-25%) with global hypokinesis and akinesis of the entire septal wall.  Moderate RV dilatation with mild  moderate systolic dysfunction.  Moderate left and right atrial dilatation.  Mild to moderate mitral and mild tricuspid regurgitation.  There is a diffuse mid wall late gadolinium enhancement.  There is no evidence of infiltrative of inflammatory cardiomyopathy.  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   Full dictation to follow.  Lars Masson 09/16/2015

## 2015-09-16 NOTE — Progress Notes (Signed)
Site area: lt AC  Site Prior to Removal:  Level 0 Pressure Applied For: 10 minutes Manual:   yes Patient Status During Pull:  awake Post Pull Site:  Level 0 Post Pull Instructions Given: yes  Post Pull Pulses Present:  Dressing Applied:  yes Bedrest begins @ 11:45 Comments:

## 2015-09-16 NOTE — Interval H&P Note (Signed)
History and Physical Interval Note:  09/16/2015 9:53 AM  Robin Owen  has presented today for surgery, with the diagnosis of lv disfunction  The various methods of treatment have been discussed with the patient and family. After consideration of risks, benefits and other options for treatment, the patient has consented to  Procedure(s): Right/Left Heart Cath and Coronary Angiography (N/A) as a surgical intervention .  The patient's history has been reviewed, patient examined, no change in status, stable for surgery.  I have reviewed the patient's chart and labs.  Questions were answered to the patient's satisfaction.   Cath Lab Visit (complete for each Cath Lab visit)  Clinical Evaluation Leading to the Procedure:   ACS: No.  Non-ACS:    Anginal Classification: CCS II  Anti-ischemic medical therapy: No Therapy  Non-Invasive Test Results: No non-invasive testing performed  Prior CABG: No previous CABG        Theron Arista [facility] 09/16/2015 9:53 AM

## 2015-09-16 NOTE — Progress Notes (Signed)
Patient Name: Robin Owen Date of Encounter: 09/16/2015  Primary Cardiologist: Dr. Delton See   Principal Problem:   Acute systolic CHF (congestive heart failure) (HCC) Active Problems:   Hyperlipidemia   Hypertension   Fibromyalgia   Hypothyroidism    SUBJECTIVE  Denies any CP or SOB. Feeling well  CURRENT MEDS . aspirin EC  81 mg Oral Daily  . atorvastatin  10 mg Oral q1800  . enoxaparin (LOVENOX) injection  40 mg Subcutaneous Daily  . FLUoxetine  20 mg Oral Daily  . gabapentin  600 mg Oral TID  . insulin aspart  0-9 Units Subcutaneous TID WC  . levothyroxine  88 mcg Oral QAC breakfast  . Liraglutide  1.8 mg Subcutaneous Daily  . lisinopril  20 mg Oral Daily  . potassium chloride  20 mEq Oral BID  . sodium chloride  3 mL Intravenous Q12H  . sodium chloride  3 mL Intravenous Q12H    OBJECTIVE  Filed Vitals:   09/15/15 1500 09/15/15 2050 09/16/15 0108 09/16/15 0524  BP: 132/96 126/86 117/83 127/97  Pulse: 103 105 108 102  Temp: 98.7 F (37.1 C) 100.3 F (37.9 C) 99.2 F (37.3 C) 99.3 F (37.4 C)  TempSrc: Oral Oral Oral Oral  Resp: 18 18 18 18   Height:      Weight:    163 lb 9.6 oz (74.208 kg)  SpO2: 98% 95% 96% 94%    Intake/Output Summary (Last 24 hours) at 09/16/15 0816 Last data filed at 09/16/15 0810  Gross per 24 hour  Intake   1417 ml  Output   2150 ml  Net   -733 ml   Filed Weights   09/14/15 2241 09/15/15 0352 09/16/15 0524  Weight: 171 lb 6.4 oz (77.747 kg) 166 lb 12.8 oz (75.66 kg) 163 lb 9.6 oz (74.208 kg)    PHYSICAL EXAM  General: Pleasant, NAD. Neuro: Alert and oriented X 3. Moves all extremities spontaneously. Psych: Normal affect. HEENT:  Normal  Neck: Supple without bruits or JVD. Lungs:  Resp regular and unlabored. Largely clear to auscultation, mildly diminished breath sound in bilateral bases, but otherwise no significant rale.  Heart: RRR no s3, s4, or murmurs. Abdomen: Soft, non-tender, non-distended, BS + x 4.   Extremities: No clubbing, cyanosis or edema. DP/PT/Radials 2+ and equal bilaterally.  Accessory Clinical Findings  CBC  Recent Labs  09/14/15 1820 09/15/15 0031 09/15/15 0542  WBC 12.4* 9.0 10.8*  NEUTROABS 9.2*  --  7.6  HGB 11.6* 11.3* 12.0  HCT 35.8* 34.4* 37.0  MCV 88.0 88.2 88.7  PLT 385 323 348   Basic Metabolic Panel  Recent Labs  09/15/15 0031 09/15/15 0542 09/16/15 0334  NA 138 140 137  K 3.7 3.7 3.4*  CL 102 102 100*  CO2 25 28 25   GLUCOSE 135* 191* 130*  BUN 13 15 16   CREATININE 1.05* 1.19* 1.13*  CALCIUM 9.2 9.0 8.7*  MG 1.7  --   --   PHOS 4.7*  --   --    Liver Function Tests  Recent Labs  09/15/15 0542  AST 147*  ALT 154*  ALKPHOS 75  BILITOT 0.6  PROT 6.7  ALBUMIN 3.7   Cardiac Enzymes  Recent Labs  09/15/15 0031 09/15/15 0542 09/15/15 1227  TROPONINI 0.10* 0.10* 0.08*   BNP Invalid input(s): POCBNP D-Dimer  Recent Labs  09/14/15 1820  DDIMER 1.72*   Thyroid Function Tests  Recent Labs  09/16/15 0334  TSH 1.990  TELE Sinus tach with HR high 90s    ECG  No new EKG  Echocardiogram 09/15/2015  LV EF: 20%  ------------------------------------------------------------------- Indications:   CHF - 428.0.  ------------------------------------------------------------------- History:  Risk factors: Hypertension. Diabetes mellitus.  ------------------------------------------------------------------- Study Conclusions  - Left ventricle: The cavity size was severely dilated. Wall thickness was normal. The estimated ejection fraction was 20%. Diffuse hypokinesis. - Mitral valve: There was mild regurgitation. - Left atrium: The atrium was mildly dilated. - Pulmonary arteries: PA peak pressure: 37 mm Hg (S). - Pericardium, extracardiac: A trivial pericardial effusion was identified posterior to the heart.  Impressions:  - Global L. strain =-7.8.    Radiology/Studies  Dg Chest 2  View  09/14/2015  CLINICAL DATA:  Shortness of breath on exertion for 1 day. History of diabetes, hypertension and smoking. Initial encounter. EXAM: CHEST  2 VIEW COMPARISON:  05/20/2009. FINDINGS: Significant interval increase in the heart size, now moderately enlarged. The mediastinal contours are stable. There is new vascular congestion without overt pulmonary edema, confluent airspace opacity or significant pleural effusion. The bones appear unchanged. IMPRESSION: Interval significant enlargement of the cardiac silhouette, likely reflecting cardiomegaly with associated vascular congestion. A pericardial effusion would be a consideration. Electronically Signed   By: Carey Bullocks M.D.   On: 09/14/2015 18:59   Ct Angio Chest Pe W/cm &/or Wo Cm  09/14/2015  CLINICAL DATA:  Pt getting ekg Pt co SOB x 1 wk, getting worse, elevated d-dimer EXAM: CT ANGIOGRAPHY CHEST WITH CONTRAST TECHNIQUE: Multidetector CT imaging of the chest was performed using the standard protocol during bolus administration of intravenous contrast. Multiplanar CT image reconstructions and MIPs were obtained to evaluate the vascular anatomy. CONTRAST:  OMNIPAQUE IOHEXOL 350 MG/ML SOLN COMPARISON:  None. FINDINGS: Left arm IV contrast injection. Innominate vein and SVC patent. Reflux of contrast from right atrium into central aspect of hepatic veins. RV nondilated. Satisfactory opacification of pulmonary arteries noted, and there is no evidence of pulmonary emboli. Patent pulmonary veins. Incomplete opacification of the thoracic aorta limiting evaluation for dissection. Classic 3 vessel brachiocephalic arterial origin anatomy. No significant atheromatous calcified plaque nor aneurysm. Trace bilateral pleural effusions. Trace pericardial effusion. No hilar or mediastinal adenopathy. Mild peripheral septal prominence in both lungs. No confluent airspace disease. Small amount of fluid in the interlobar fissures. Thoracic spine and  sternum intact. Visualized portions of upper abdomen unremarkable. Review of the MIP images confirms the above findings. IMPRESSION: 1. Negative for acute PE or thoracic aortic aneurysm. 2. Small pericardial and pleural effusions. 3. Mild interstitial edema. Electronically Signed   By: Corlis Leak M.D.   On: 09/14/2015 20:16    ASSESSMENT AND PLAN  1. Acute systolic HF with newly diagnosed LV dysfunction - Unclear cause, no history of EtOH abuse, no history of chemotherapy, history of hypothyroidism noted, check TSH, does have hypertension, however systolic blood pressure has been relatively controlled. Current tachycardia is likely related to cardiomyopathy and heart acute heart failure. - Pending L&RHC today, likely switch to PO lasix afterward. - Continue lisinopril, home HCTZ held. Will start low dose coreg if RHC today shows no significant wedge pressure. Replete K 3.4, hypokalemia related to IV diuresis.  2. Elevated d-dimer: CTA of chest negative for PE  3. Mildly AKI  4. HTN 5. HLD 6. DMII 7. Hypothyroidism: will check TSH tomorrow AM 8. Fibromyalgia  Signed, Azalee Course PA-C Pager: [phone]  The patient was seen, examined and discussed with Azalee Course, PA-C and I agree with  the above.   A very pleasant 54 year old female with h/o HTN, fibromyalgia, and with no prior cardiac history presented with exertional SOB that started a week ago and was progressively worsening. She denies any prior SOB or CP, she stays at home but have never noticed any limitations in doing ADL.  She has been through a lot of stress lately dealing with her sick father. Denies FH of premature CAD, CHF or SCD.  Her echo shows severely dilated LV with LVEF 20% and diffuse hypokinesis, mildly dilated LA. This is suggestive Of a longstanding process that tipped over and she developed symptoms of CHF. 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 She has  mildly elevated troponin with flat trend suggestive demand ischemia sec to CHF.  Her cath today showed normal coronaries, mild pulmonary hypertension. She is still mildly fluid overloaded, with SpO2 94% at rest. We will continue lasix 20 mg iv BID. Crea is stable. She is in sinus tachycardia, I will start carvedilol 3.125 mg po BID and uptitrate as tolerated. We will start PT and monitor oxygen saturations.   Cardiac MRI today to look for infiltrative/inflammatory cardiomyopathy.   Lars Masson 09/16/2015

## 2015-09-16 NOTE — H&P (View-Only) (Signed)
CARDIOLOGY CONSULT NOTE   Patient ID: KAILANI BRASS MRN: [id number], DOB/AGE: 1961-02-22   Admit date: 09/14/2015 Date of Consult: 09/15/2015   Primary Physician: Junious Silk, MD Primary Cardiologist: new  Pt. Profile  54 year old Caucasian female with past medical history of HTN, HLD, DMII, hypothyroidism and fibromyalgia presented with 1 week onset of increasing DOE and found to have EF 20% on echo which is new.   Problem List  Past Medical History  Diagnosis Date  . Diabetes mellitus without complication (HCC)   . Hypertension   . Fibromyalgia   . Hypothyroidism   . Hyperlipidemia     Past Surgical History  Procedure Laterality Date  . Endometrial ablation  2012     Allergies  No Known Allergies  HPI   The patient is a pleasant 54 year old Caucasian female with past medical history of HTN, HLD, DMII, hypothyroidism and fibromyalgia. She has no past cardiac history. Her father was recently diagnosed with heart failure in his 72s and her mother had a history of atrial fibrillation, however no significant family history of early CAD.   She was essentially in her usual state of health until she began to experience increasing exertional dyspnea in the last week. She did have an episode of "indigestion" feeling after food which lasted roughly 35-45 minutes last week however it is relieved with TUMs and has not recurred again. She has been compliant with her blood pressure medication most of the time, and the highest blood pressure she has experienced was around 140s systolic. She denies any prior history of chemotherapy, recent viral infection, alcohol abuse, hyperthyroidism, or chronically uncontrolled hypertension. Her symptom eventually worsened to the degree that she would have shortness of breath with minimal exertion. She denies any resting dyspnea, orthopnea or paroxysmal nocturnal dyspnea. She denies any recent lower extremity edema.   She eventually decided  to seek medical attention in the ED, bedside ultrasound showed global hypokinesis with low EF. She was admitted to internal medicine service. Significant finding on arrival include BNP 1800. Troponin mildly elevated at 0.09 --> 0.10 --> 0.10. White blood cell count 12.4. Hemoglobin 11.6. Creatinine 1.14. Chest x-ray confirms vascular congestion and cardiomegaly. EKG showed sinus tachycardia with poor R wave progression in the anterior lead. Initial d-dimer was elevated at 1.72. CTA of the chest was obtained which was negative for PE, however did confirm mild interstitial edema, small pericardial and pleural effusion. She was started on IV diuresis with Lasix. A formal echocardiogram was obtained on 09/15/2015 which showed EF 20%, mild MR, PA peak pressure 37, trivial pericardial effusion. Of note, patient does admit to be under significant amount of stress, however did not see obvious apical hypokinesis on echocardiogram. Cardiology has been consulted for newly diagnosed LV dysfunction.  Of note, she also endorsed abdominal distention recently, however the distention has improved after starting IV Lasix.   Inpatient Medications  . aspirin EC  81 mg Oral Daily  . atorvastatin  10 mg Oral q1800  . enoxaparin (LOVENOX) injection  40 mg Subcutaneous Daily  . FLUoxetine  20 mg Oral Daily  . furosemide  40 mg Intravenous BID  . gabapentin  600 mg Oral TID  . insulin aspart  0-9 Units Subcutaneous TID WC  . levothyroxine  88 mcg Oral QAC breakfast  . Liraglutide  1.8 mg Subcutaneous Daily  . lisinopril  20 mg Oral Daily  . potassium chloride  20 mEq Oral BID  . sodium chloride  3 mL Intravenous Q12H  Family History Family History  Problem Relation Age of Onset  . Pulmonary fibrosis Mother   . Hypertension Mother   . Atrial fibrillation Mother   . Hypertension Father   . Diabetes Mellitus II Father   . Kidney disease Father   . Diabetes Mellitus II Brother   . Hypertension Brother   .  Atrial fibrillation Maternal Grandmother   . Hypertension Maternal Grandmother   . Stroke Maternal Grandmother      Social History Social History   Social History  . Marital Status: Married    Spouse Name: N/A  . Number of Children: N/A  . Years of Education: N/A   Occupational History  . Not on file.   Social History Main Topics  . Smoking status: Never Smoker   . Smokeless tobacco: Never Used  . Alcohol Use: No  . Drug Use: No  . Sexual Activity: Yes    Birth Control/ Protection: None   Other Topics Concern  . Not on file   Social History Narrative  . No narrative on file     Review of Systems  General:  No chills, fever, night sweats or weight changes.  Cardiovascular:  No chest pain, edema, orthopnea, palpitations, paroxysmal nocturnal dyspnea. +dyspnea on exertion Dermatological: No rash, lesions/masses Respiratory: No cough, dyspnea Urologic: No hematuria, dysuria Abdominal:   No nausea, vomiting, diarrhea, bright red blood per rectum, melena, or hematemesis +abdominal distension. Neurologic:  No visual changes, wkns, changes in mental status. All other systems reviewed and are otherwise negative except as noted above.  Physical Exam  Blood pressure 117/70, pulse 98, temperature 98.6 F (37 C), temperature source Oral, resp. rate 18, height 5\' 5"  (1.651 m), weight 166 lb 12.8 oz (75.66 kg), SpO2 95 %.  General: Pleasant, NAD Psych: Normal affect. Neuro: Alert and oriented X 3. Moves all extremities spontaneously. HEENT: Normal  Neck: Supple without bruits  +mildly elevated JVD. Lungs:  Resp regular and unlabored. Mildly decreased breath sound in bilateral bases, more notable in R base Heart: RRR no s3, s4, or murmurs. Abdomen: Soft, non-tender, non-distended, BS + x 4.  Extremities: No clubbing, cyanosis or edema. DP/PT/Radials 2+ and equal bilaterally.  Labs   Recent Labs  09/14/15 1820 09/15/15 0031 09/15/15 0542 09/15/15 1227  TROPONINI 0.09*  0.10* 0.10* 0.08*   Lab Results  Component Value Date   WBC 10.8* 09/15/2015   HGB 12.0 09/15/2015   HCT 37.0 09/15/2015   MCV 88.7 09/15/2015   PLT 348 09/15/2015     Recent Labs Lab 09/15/15 0542  NA 140  K 3.7  CL 102  CO2 28  BUN 15  CREATININE 1.19*  CALCIUM 9.0  PROT 6.7  BILITOT 0.6  ALKPHOS 75  ALT 154*  AST 147*  GLUCOSE 191*   No results found for: CHOL, HDL, LDLCALC, TRIG Lab Results  Component Value Date   DDIMER 1.72* 09/14/2015    Radiology/Studies  Dg Chest 2 View  09/14/2015  CLINICAL DATA:  Shortness of breath on exertion for 1 day. History of diabetes, hypertension and smoking. Initial encounter. EXAM: CHEST  2 VIEW COMPARISON:  05/20/2009. FINDINGS: Significant interval increase in the heart size, now moderately enlarged. The mediastinal contours are stable. There is new vascular congestion without overt pulmonary edema, confluent airspace opacity or significant pleural effusion. The bones appear unchanged. IMPRESSION: Interval significant enlargement of the cardiac silhouette, likely reflecting cardiomegaly with associated vascular congestion. A pericardial effusion would be a consideration. Electronically Signed  By: Carey Bullocks M.D.   On: 09/14/2015 18:59   Ct Angio Chest Pe W/cm &/or Wo Cm  09/14/2015  CLINICAL DATA:  Pt getting ekg Pt co SOB x 1 wk, getting worse, elevated d-dimer EXAM: CT ANGIOGRAPHY CHEST WITH CONTRAST TECHNIQUE: Multidetector CT imaging of the chest was performed using the standard protocol during bolus administration of intravenous contrast. Multiplanar CT image reconstructions and MIPs were obtained to evaluate the vascular anatomy. CONTRAST:  OMNIPAQUE IOHEXOL 350 MG/ML SOLN COMPARISON:  None. FINDINGS: Left arm IV contrast injection. Innominate vein and SVC patent. Reflux of contrast from right atrium into central aspect of hepatic veins. RV nondilated. Satisfactory opacification of pulmonary arteries noted, and  there is no evidence of pulmonary emboli. Patent pulmonary veins. Incomplete opacification of the thoracic aorta limiting evaluation for dissection. Classic 3 vessel brachiocephalic arterial origin anatomy. No significant atheromatous calcified plaque nor aneurysm. Trace bilateral pleural effusions. Trace pericardial effusion. No hilar or mediastinal adenopathy. Mild peripheral septal prominence in both lungs. No confluent airspace disease. Small amount of fluid in the interlobar fissures. Thoracic spine and sternum intact. Visualized portions of upper abdomen unremarkable. Review of the MIP images confirms the above findings. IMPRESSION: 1. Negative for acute PE or thoracic aortic aneurysm. 2. Small pericardial and pleural effusions. 3. Mild interstitial edema. Electronically Signed   By: Corlis Leak M.D.   On: 09/14/2015 20:16    ECG  Normal sinus rhythm with poor R-wave progression in anterior leads. No obvious ST-T wave changes.  ASSESSMENT AND PLAN  1. Acute systolic HF with newly diagnosed LV dysfunction  - Unclear cause, no history of EtOH abuse, no history of chemotherapy, history of hypothyroidism noted, check TSH, does have hypertension, however systolic blood pressure has been relatively controlled. Current tachycardia is likely related to cardiomyopathy and heart acute heart failure.  - Will continue IV Lasix today, likely near euvolemic level, stop after midnight. Will discuss with M.D. regarding possible left and right heart cath tomorrow versus myoview  - Continue lisinopril, home HCTZ held. Once reached euvolemic level, will add carvedilol. Likely also had spironolactone prior to discharge.  2. Elevated d-dimer: CTA of chest negative for PE  3. Mildly AKI  4. HTN 5. HLD 6. DMII 7. Hypothyroidism: will check TSH tomorrow AM 8. Fibromyalgia  Signed, Azalee Course, PA-C 09/15/2015, 3:42 PM   The patient was seen, examined and discussed with Azalee Course, PA-C and I agree with the  above.   A very pleasant 54 year old female with h/o HTN, fibromyalgia, and with no prior cardiac history presented with exertional SOB that started a week ago and was progressively worsening. She denies any prior SOB or CP, she stays at home but have never noticed any limitations in doing ADL.  She has been through a lot of stress lately dealing with her sick father. Denies FH of premature CAD, CHF or SCD.  Her echo shows severely dilated LV with LVEF 20% and diffuse hypokinesis, mildly dilated LA. This is suggestive  Of a longstanding process that tipped over and she developed symptoms of CHF. 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 She has mildly elevated troponin with flat trend suggestive demand ischemia sec to CHF. However, we will schedule a left and right cath tomorrow, if no CAD, we will plan for a cardiac MRI.  She responded well to iv lasix, we will give additional lasix 40 mg iv x today as she is still fluid overloaded and reevaluate  in the morning, Normal Crea.   Lars Masson 09/15/2015

## 2015-09-16 NOTE — Progress Notes (Signed)
Expand All Collapse All   REDS'@Discharge'  Study Informed Consent   Subject Name: Robin Owen  Subject met inclusion and exclusion criteria. The informed consent form, study requirements and expectations were reviewed with the subject and questions and concerns were addressed prior to the signing of the consent form. The subject verbalized understanding of the trail requirements. The subject agreed to participate in the Reds'@Discharge'  trial and signed the informed consent. The informed consent was obtained prior to performance of any protocol-specific procedures for the subject. A copy of the signed informed consent was given to the subject and a copy was placed in the subject's medical record.  Jake Bathe, RN 09/16/15

## 2015-09-16 NOTE — Progress Notes (Signed)
[facility] Progress Note   Robin Owen  MWU:[id number]  DOB: 1960-12-23  DOA: 09/14/2015 PCP: Junious Silk, MD  Brief narrative: Robin Owen is a 54 y.o. female with DM 2, HTN, hypothyroidism, HLP and fibromyalgia presents to the hospital for dyspnea on exertion for 1 week. No pedal edema. Found to have pulmonary edema and admitted for further work up and treatment. No c/o chest pain or pressure.    Subjective: No new complaints. No dyspnea at rest- she has no ambulated. No chest pain.   Assessment/Plan: Principal Problem:   Acute systolic CHF (congestive heart failure - no h/o CHF- ECHO reveals EF of 20% with global hypokinesis- already on ACE I- can start B Blocker after diuresed - cardiac cath does not reveal CAD  Active Problems: AKI - follow- improving with diuresis  DM - cont sliding scale & victoza- hold Metformin  Mild troponin elevation - likely due to CHF- as mentioned above, cath negative for CAD    Hyperlipidemia - cont statin    Hypertension - cont Lisinopril- hold HCTZ    Fibromyalgia - cont Flexeril, Gabapentin and Tramadol    Hypothyroidism - cont Synthroid    Code Status:     Code Status Orders        Start     Ordered   09/14/15 2311  Full code   Continuous     09/14/15 2310     Family Communication:  Disposition Plan: home when stable DVT prophylaxis: Lovenox Consultants:cardiology  Procedures: 2 D ECHO Left ventricle: The cavity size was severely dilated. Wall thickness was normal. The estimated ejection fraction was 20%. Diffuse hypokinesis. - Mitral valve: There was mild regurgitation. - Left atrium: The atrium was mildly dilated. - Pulmonary arteries: PA peak pressure: 37 mm Hg (S). - Pericardium, extracardiac: A trivial pericardial effusion was identified posterior to the heart.   Antibiotics: Anti-infectives    None      Objective: Filed Weights   09/14/15 2241 09/15/15 0352 09/16/15  0524  Weight: 77.747 kg (171 lb 6.4 oz) 75.66 kg (166 lb 12.8 oz) 74.208 kg (163 lb 9.6 oz)    Intake/Output Summary (Last 24 hours) at 09/16/15 1310 Last data filed at 09/16/15 0810  Gross per 24 hour  Intake    937 ml  Output   1700 ml  Net   -763 ml     Vitals Filed Vitals:   09/16/15 1150 09/16/15 1214 09/16/15 1225 09/16/15 1240  BP: 152/99 154/80 154/81 154/80  Pulse: 95 93 95 95  Temp:  99.3 F (37.4 C)    TempSrc:  Oral    Resp: Height:      Weight:      SpO2: 88% 97% 98% 98%    Exam:  General:  Pt is alert, not in acute distress  HEENT: No icterus, No thrush, oral mucosa moist  Cardiovascular: regular rate and rhythm, S1/S2 No murmur  Respiratory: crackles at bases  Abdomen: Soft, +Bowel sounds, non tender, non distended, no guarding  MSK: No LE edema, cyanosis or clubbing  Data Reviewed: Basic Metabolic Panel:  Recent Labs Lab 09/14/15 1820 09/15/15 0031 09/15/15 0542 09/16/15 0334  NA 136 138 140 137  K 3.5 3.7 3.7 3.4*  CL 99* 102 102 100*  CO2 GLUCOSE 97 135* 191* 130*  BUN CREATININE 1.14* 1.05* 1.19* 1.13*  CALCIUM 9.1 9.2 9.0 8.7*  MG  --  1.7  --   --   PHOS  --  4.7*  --   --    Liver Function Tests:  Recent Labs Lab 09/15/15 0542  AST 147*  ALT 154*  ALKPHOS 75  BILITOT 0.6  PROT 6.7  ALBUMIN 3.7   No results for input(s): LIPASE, AMYLASE in the last 168 hours. No results for input(s): AMMONIA in the last 168 hours. CBC:  Recent Labs Lab 09/14/15 1820 09/15/15 0031 09/15/15 0542  WBC 12.4* 9.0 10.8*  NEUTROABS 9.2*  --  7.6  HGB 11.6* 11.3* 12.0  HCT 35.8* 34.4* 37.0  MCV 88.0 88.2 88.7  PLT 385 323 348   Cardiac Enzymes:  Recent Labs Lab 09/14/15 1820 09/15/15 0031 09/15/15 0542 09/15/15 1227  TROPONINI 0.09* 0.10* 0.10* 0.08*   BNP (last 3 results)  Recent Labs  09/14/15 1820 09/15/15 0542  BNP 1819.1* 2354.7*    ProBNP (last 3 results) No results for  input(s): PROBNP in the last 8760 hours.  CBG:  Recent Labs Lab 09/15/15 1254 09/15/15 1632 09/15/15 2049 09/16/15 0625 09/16/15 1136  GLUCAP 129* 126* 136* 147* 120*    No results found for this or any previous visit (from the past 240 hour(s)).   Studies: Dg Chest 2 View  09/14/2015  CLINICAL DATA:  Shortness of breath on exertion for 1 day. History of diabetes, hypertension and smoking. Initial encounter. EXAM: CHEST  2 VIEW COMPARISON:  05/20/2009. FINDINGS: Significant interval increase in the heart size, now moderately enlarged. The mediastinal contours are stable. There is new vascular congestion without overt pulmonary edema, confluent airspace opacity or significant pleural effusion. The bones appear unchanged. IMPRESSION: Interval significant enlargement of the cardiac silhouette, likely reflecting cardiomegaly with associated vascular congestion. A pericardial effusion would be a consideration. Electronically Signed   By: Carey Bullocks M.D.   On: 09/14/2015 18:59   Ct Angio Chest Pe W/cm &/or Wo Cm  09/14/2015  CLINICAL DATA:  Pt getting ekg Pt co SOB x 1 wk, getting worse, elevated d-dimer EXAM: CT ANGIOGRAPHY CHEST WITH CONTRAST TECHNIQUE: Multidetector CT imaging of the chest was performed using the standard protocol during bolus administration of intravenous contrast. Multiplanar CT image reconstructions and MIPs were obtained to evaluate the vascular anatomy. CONTRAST:  OMNIPAQUE IOHEXOL 350 MG/ML SOLN COMPARISON:  None. FINDINGS: Left arm IV contrast injection. Innominate vein and SVC patent. Reflux of contrast from right atrium into central aspect of hepatic veins. RV nondilated. Satisfactory opacification of pulmonary arteries noted, and there is no evidence of pulmonary emboli. Patent pulmonary veins. Incomplete opacification of the thoracic aorta limiting evaluation for dissection. Classic 3 vessel brachiocephalic arterial origin anatomy. No significant  atheromatous calcified plaque nor aneurysm. Trace bilateral pleural effusions. Trace pericardial effusion. No hilar or mediastinal adenopathy. Mild peripheral septal prominence in both lungs. No confluent airspace disease. Small amount of fluid in the interlobar fissures. Thoracic spine and sternum intact. Visualized portions of upper abdomen unremarkable. Review of the MIP images confirms the above findings. IMPRESSION: 1. Negative for acute PE or thoracic aortic aneurysm. 2. Small pericardial and pleural effusions. 3. Mild interstitial edema. Electronically Signed   By: Corlis Leak M.D.   On: 09/14/2015 20:16    Scheduled Meds:  Scheduled Meds: . aspirin EC  81 mg Oral Daily  . atorvastatin  10 mg Oral q1800  . [START ON 09/17/2015] enoxaparin (LOVENOX) injection  40 mg Subcutaneous Q24H  . FLUoxetine  20 mg Oral Daily  . gabapentin  600 mg Oral TID  . insulin aspart  0-9 Units Subcutaneous TID WC  . levothyroxine  88 mcg Oral QAC breakfast  . Liraglutide  1.8 mg Subcutaneous Daily  . lisinopril  20 mg Oral Daily  . sodium chloride  3 mL Intravenous Q12H  . sodium chloride  3 mL Intravenous Q12H  . sodium chloride  3 mL Intravenous Q12H   Continuous Infusions:   Time spent on care of this patient: 35 min   RIZWAN,SAIMA, MD 09/16/2015, 1:10 PM  LOS: 2 days   [facility] Office  [phone] Pager - Text Page per [email or web address] If 7PM-7AM, please contact night-coverage [email or web address]

## 2015-09-16 NOTE — Progress Notes (Signed)
Patient consented to [email or web address]  research study.

## 2015-09-16 NOTE — Progress Notes (Signed)
Pt has gone through cardiac cath today (via Rt radial), Site is CDI and level 0, vitals stable, pt is getting up to the bathroom, tolerated cleared diet and advanced to heart healthy and carb modified diet, pt is scheduled for MRI today at some point between 5-6, will continue to monitor the patient.

## 2015-09-17 DIAGNOSIS — E785 Hyperlipidemia, unspecified: Secondary | ICD-10-CM | POA: Diagnosis not present

## 2015-09-17 DIAGNOSIS — E038 Other specified hypothyroidism: Secondary | ICD-10-CM | POA: Diagnosis not present

## 2015-09-17 DIAGNOSIS — I5021 Acute systolic (congestive) heart failure: Secondary | ICD-10-CM | POA: Diagnosis not present

## 2015-09-17 DIAGNOSIS — I272 Other secondary pulmonary hypertension: Secondary | ICD-10-CM

## 2015-09-17 DIAGNOSIS — I10 Essential (primary) hypertension: Secondary | ICD-10-CM | POA: Diagnosis not present

## 2015-09-17 DIAGNOSIS — I429 Cardiomyopathy, unspecified: Secondary | ICD-10-CM

## 2015-09-17 LAB — GLUCOSE, CAPILLARY
Glucose-Capillary: 133 mg/dL — ABNORMAL HIGH (ref 65–99)
Glucose-Capillary: 200 mg/dL — ABNORMAL HIGH (ref 65–99)

## 2015-09-17 LAB — BASIC METABOLIC PANEL
Anion gap: 10 (ref 5–15)
BUN: 14 mg/dL (ref 6–20)
CO2: 24 mmol/L (ref 22–32)
Calcium: 8.7 mg/dL — ABNORMAL LOW (ref 8.9–10.3)
Chloride: 102 mmol/L (ref 101–111)
Creatinine, Ser: 0.91 mg/dL (ref 0.44–1.00)
GFR calc Af Amer: >60 mL/min (ref >60)
GFR calc non Af Amer: >60 mL/min (ref >60)
Glucose, Bld: 135 mg/dL — ABNORMAL HIGH (ref 65–99)
Potassium: 3.8 mmol/L (ref 3.5–5.1)
Sodium: 136 mmol/L (ref 135–145)

## 2015-09-17 MED ORDER — METFORMIN HCL 1000 MG PO TABS: 1000.0000 mg | ORAL_TABLET | Freq: Two times a day (BID) | ORAL | Status: DC

## 2015-09-17 MED ORDER — POTASSIUM CHLORIDE CRYS ER 20 MEQ PO TBCR: 20.0000 meq | EXTENDED_RELEASE_TABLET | Freq: Two times a day (BID) | ORAL | Status: DC

## 2015-09-17 MED ORDER — FUROSEMIDE 40 MG PO TABS: 40.0000 mg | ORAL_TABLET | Freq: Two times a day (BID) | ORAL | Status: DC

## 2015-09-17 MED ORDER — CARVEDILOL 6.25 MG PO TABS: 6.2500 mg | ORAL_TABLET | Freq: Two times a day (BID) | ORAL | Status: DC

## 2015-09-17 MED ORDER — POTASSIUM CHLORIDE CRYS ER 20 MEQ PO TBCR
20.0000 meq | EXTENDED_RELEASE_TABLET | Freq: Two times a day (BID) | ORAL | Status: DC
  Administered 2015-09-17: 20 meq via ORAL
  Filled 2015-09-17: qty 1

## 2015-09-17 NOTE — Progress Notes (Signed)
Ambulate patient in hall way per MD order. No c/o cp, no shortness of breath. Patient oxygen saturation 100%/RA at rest and while ambulating.will continue to monitor patient.

## 2015-09-17 NOTE — Discharge Summary (Addendum)
Physician Discharge Summary  Robin Owen ZOX:[id number] DOB: [birth year] DOA: 09/14/2015  PCP: Junious Silk, MD  Admit date: 09/14/2015 Discharge date: 09/17/2015  Time spent: 60 minutes  Recommendations for Outpatient Follow-up:  1. Daily weights- bmet in 1 wk 2. Cardiac MRI ordered by cardiology is still pending and needs to be followed up on   Discharge Condition: stable   Discharge Diagnoses:  Principal Problem:   Acute systolic CHF (congestive heart failure) (HCC) Active Problems:   Nonischemic cardiomyopathy (HCC)   Pulmonary hypertension (HCC)   Hyperlipidemia   Hypertension   Fibromyalgia   Hypothyroidism   History of present illness:  Robin Owen is a 54 y.o. female with DM 2, HTN, hypothyroidism, HLP and fibromyalgia presents to the hospital for dyspnea on exertion for 1 week. No pedal edema. Found to have pulmonary edema and admitted for further work up and treatment. CT of the chest negative for PE. No complaint of chest pain or pressure.   Hospital Course:  Principal Problem: Acute non-ischemic cardiomyopathy with systolic CHF - no h/o CHF- ECHO reveals EF of 20% with global hypokinesis-no etiology determined for her cardiomyopathy- cardiology suspects she may have had chronic non-ischemic cardiomyopathy with slowly progressed to a point where is became decompensated - cardiac cath does not reveal CAD - cont Coreg which has been increased to 6.25 mg BID -  already on ACE I - has been enrolled in the ReDs Vest Discharge trial  Active Problems: AKI - resolved with diuresis- see lab work below  DM 2 - cont sliding scale & victoza- held Metformin temporarily   Mild troponin elevation - likely due to CHF- as mentioned above, cath negative for CAD  Hyperlipidemia - cont statin  Hypertension - cont Lisinopril- hold HCTZ  Fibromyalgia - cont Flexeril, Gabapentin and Tramadol  Hypothyroidism - cont Synthroid    Procedures: 2 D  ECHO Left ventricle: The cavity size was severely dilated. Wall thickness was normal. The estimated ejection fraction was 20%. Diffuse hypokinesis. - Mitral valve: There was mild regurgitation. - Left atrium: The atrium was mildly dilated. - Pulmonary arteries: PA peak pressure: 37 mm Hg (S). - Pericardium, extracardiac: A trivial pericardial effusion was identified posterior to the heart.  Impressions:  - Global L. strain =-7.8. Cardiac cath  Cardiac MRI  Consultations: Cardiology   Discharge Exam: Filed Weights   09/15/15 8119 09/16/15 0524 09/17/15 0543  Weight: 75.66 kg (166 lb 12.8 oz) 74.208 kg (163 lb 9.6 oz) 74.299 kg (163 lb 12.8 oz)   Filed Vitals:   09/17/15 0849  BP: 112/71  Pulse: 95  Temp: 98.7 F (37.1 C)  Resp:     General: AAO x 3, no distress Cardiovascular: RRR, no murmurs  Respiratory: clear to auscultation bilaterally GI: soft, non-tender, non-distended, bowel sound positive  Discharge Instructions You were cared for by a hospitalist during your hospital stay. If you have any questions about your discharge medications or the care you received while you were in the hospital after you are discharged, you can call the unit and asked to speak with the hospitalist on call if the hospitalist that took care of you is not available. Once you are discharged, your primary care physician will handle any further medical issues. Please note that NO REFILLS for any discharge medications will be authorized once you are discharged, as it is imperative that you return to your primary care physician (or establish a relationship with a primary care physician if you do not have  one) for your aftercare needs so that they can reassess your need for medications and monitor your lab values.      Discharge Instructions    (HEART FAILURE PATIENTS) Call MD:  Anytime you have any of the following symptoms: 1) 3 pound weight gain in 24 hours or 5 pounds in 1 week 2)  shortness of breath, with or without a dry hacking cough 3) swelling in the hands, feet or stomach 4) if you have to sleep on extra pillows at night in order to breathe.    Complete by:  As directed      Amb Referral to HF Clinic    Complete by:  As directed      Discharge instructions    Complete by:  As directed   Low salt, low cholesterol, diabetic diet     Increase activity slowly    Complete by:  As directed             Medication List    STOP taking these medications        hydrochlorothiazide 12.5 MG tablet  Commonly known as:  HYDRODIURIL      TAKE these medications        atorvastatin 10 MG tablet  Commonly known as:  LIPITOR  Take 10 mg by mouth daily.     CALTRATE 600+D 600-400 MG-UNIT tablet  Generic drug:  Calcium Carbonate-Vitamin D  Take 1 tablet by mouth daily.     carvedilol 6.25 MG tablet  Commonly known as:  COREG  Take 1 tablet (6.25 mg total) by mouth 2 (two) times daily with a meal.     cyclobenzaprine 10 MG tablet  Commonly known as:  FLEXERIL  Take 10 mg by mouth 2 (two) times daily as needed for muscle spasms.     FLUoxetine 20 MG tablet  Commonly known as:  PROZAC  Take 20 mg by mouth daily.     furosemide 40 MG tablet  Commonly known as:  LASIX  Take 1 tablet (40 mg total) by mouth 2 (two) times daily.     gabapentin 600 MG tablet  Commonly known as:  NEURONTIN  Take 300-600 mg by mouth 3 (three) times daily as needed (for fibromyalgia). Take 1/2 tablet (300mg ) by mouth in the morning, and afternonn, and 1 tablet (600mg ) at bedtime     levothyroxine 88 MCG tablet  Commonly known as:  SYNTHROID, LEVOTHROID  Take 88 mcg by mouth daily before breakfast.     lisinopril 20 MG tablet  Commonly known as:  PRINIVIL,ZESTRIL  Take 20 mg by mouth daily.     metFORMIN 1000 MG tablet  Commonly known as:  GLUCOPHAGE  Take 1 tablet (1,000 mg total) by mouth 2 (two) times daily with a meal.     multivitamin with minerals Tabs tablet  Take 1  tablet by mouth daily.     potassium chloride SA 20 MEQ tablet  Commonly known as:  K-DUR,KLOR-CON  Take 1 tablet (20 mEq total) by mouth 2 (two) times daily.     traMADol 50 MG tablet  Commonly known as:  ULTRAM  Take 50 mg by mouth every 6 (six) hours as needed for moderate pain.     VICTOZA 18 MG/3ML Sopn  Generic drug:  Liraglutide  Inject 1.8 mg into the skin daily.       No Known Allergies Follow-up Information    Follow up with Junious Silk, MD.   Specialty:  Endocrinology   Why:  later this week   Contact information:   [facility] [place] [address] [phone]       Follow up with [facility].   Specialty:  Cardiology   Why:   call for questions about your medications or if any symptoms or issues occur   Contact information:   [address] [id number] mc [place] [address] [phone]       The results of significant diagnostics from this hospitalization (including imaging, microbiology, ancillary and laboratory) are listed below for reference.    Significant Diagnostic Studies: Dg Chest 2 View  09/14/2015  CLINICAL DATA:  Shortness of breath on exertion for 1 day. History of diabetes, hypertension and smoking. Initial encounter. EXAM: CHEST  2 VIEW COMPARISON:  05/20/2009. FINDINGS: Significant interval increase in the heart size, now moderately enlarged. The mediastinal contours are stable. There is new vascular congestion without overt pulmonary edema, confluent airspace opacity or significant pleural effusion. The bones appear unchanged. IMPRESSION: Interval significant enlargement of the cardiac silhouette, likely reflecting cardiomegaly with associated vascular congestion. A pericardial effusion would be a consideration. Electronically Signed   By: Carey Bullocks M.D.   On: 09/14/2015 18:59   Ct Angio Chest Pe W/cm &/or Wo Cm  09/14/2015  CLINICAL DATA:  Pt getting  ekg Pt co SOB x 1 wk, getting worse, elevated d-dimer EXAM: CT ANGIOGRAPHY CHEST WITH CONTRAST TECHNIQUE: Multidetector CT imaging of the chest was performed using the standard protocol during bolus administration of intravenous contrast. Multiplanar CT image reconstructions and MIPs were obtained to evaluate the vascular anatomy. CONTRAST:  OMNIPAQUE IOHEXOL 350 MG/ML SOLN COMPARISON:  None. FINDINGS: Left arm IV contrast injection. Innominate vein and SVC patent. Reflux of contrast from right atrium into central aspect of hepatic veins. RV nondilated. Satisfactory opacification of pulmonary arteries noted, and there is no evidence of pulmonary emboli. Patent pulmonary veins. Incomplete opacification of the thoracic aorta limiting evaluation for dissection. Classic 3 vessel brachiocephalic arterial origin anatomy. No significant atheromatous calcified plaque nor aneurysm. Trace bilateral pleural effusions. Trace pericardial effusion. No hilar or mediastinal adenopathy. Mild peripheral septal prominence in both lungs. No confluent airspace disease. Small amount of fluid in the interlobar fissures. Thoracic spine and sternum intact. Visualized portions of upper abdomen unremarkable. Review of the MIP images confirms the above findings. IMPRESSION: 1. Negative for acute PE or thoracic aortic aneurysm. 2. Small pericardial and pleural effusions. 3. Mild interstitial edema. Electronically Signed   By: Corlis Leak M.D.   On: 09/14/2015 20:16    Microbiology: No results found for this or any previous visit (from the past 240 hour(s)).   Labs: Basic Metabolic Panel:  Recent Labs Lab 09/14/15 1820 09/15/15 0031 09/15/15 0542 09/16/15 0334 09/16/15 1320 09/17/15 0339  NA 136 138 140 137  --  136  K 3.5 3.7 3.7 3.4*  --  3.8  CL 99* 102 102 100*  --  102  CO2 --  24  GLUCOSE 97 135* 191* 130*  --  135*  BUN --  14  CREATININE 1.14* 1.05* 1.19* 1.13* 0.99 0.91  CALCIUM 9.1  9.2 9.0 8.7*  --  8.7*  MG  --  1.7  --   --   --   --   PHOS  --  4.7*  --   --   --   --    Liver Function Tests:  Recent Labs Lab 09/15/15 0542  AST 147*  ALT 154*  ALKPHOS 75  BILITOT 0.6  PROT 6.7  ALBUMIN 3.7   No results for input(s): LIPASE, AMYLASE in the last 168 hours. No results for input(s): AMMONIA in the last 168 hours. CBC:  Recent Labs Lab 09/14/15 1820 09/15/15 0031 09/15/15 0542 09/16/15 1320  WBC 12.4* 9.0 10.8* 9.6  NEUTROABS 9.2*  --  7.6  --   HGB 11.6* 11.3* 12.0 12.5  HCT 35.8* 34.4* 37.0 37.5  MCV 88.0 88.2 88.7 87.4  PLT 385 323 348 334   Cardiac Enzymes:  Recent Labs Lab 09/14/15 1820 09/15/15 0031 09/15/15 0542 09/15/15 1227  TROPONINI 0.09* 0.10* 0.10* 0.08*   BNP: BNP (last 3 results)  Recent Labs  09/14/15 1820 09/15/15 0542  BNP 1819.1* 2354.7*    ProBNP (last 3 results) No results for input(s): PROBNP in the last 8760 hours.  CBG:  Recent Labs Lab 09/16/15 1136 09/16/15 1652 09/16/15 2117 09/17/15 0600 09/17/15 1113  GLUCAP 120* 147* 216* 133* 200*       Signed:  Calvert Cantor, MD [facility] 09/17/2015, 1:07 PM

## 2015-09-17 NOTE — Progress Notes (Signed)
Patient alert oriented, denies pain, no shortness of breath. IV and tele d/c. D/c instruction explain and given to patient, all questions answered, patient verbalized understanding. Patient d/c home per order.

## 2015-09-17 NOTE — Progress Notes (Signed)
ReDS Vest Discharge Study  Results of ReDS reading  Your patient is in the Unblinded arm of the Vest at Discharge study.  The ReDS reading is:   ( < 39) = 38  Your patient is ok for discharge.    Thank You   The research team    

## 2015-09-17 NOTE — Progress Notes (Signed)
SATURATION QUALIFICATIONS: (This note is used to comply with regulatory documentation for home oxygen)  Patient Saturations on Room Air at Rest = 100%  Patient Saturations on Room Air while Ambulating = 100%  Patient Saturations on RA Liters of oxygen while Ambulating =100%  Please briefly explain why patient needs home oxygen:

## 2015-09-17 NOTE — Progress Notes (Signed)
Patient Name: Robin Owen Date of Encounter: 09/17/2015  Primary Cardiologist: Dr. Delton See   Principal Problem:   Acute systolic CHF (congestive heart failure) (HCC) Active Problems:   Hyperlipidemia   Hypertension   Fibromyalgia   Hypothyroidism   Cardiomyopathy (HCC)   Shortness of breath   Pulmonary hypertension (HCC)    SUBJECTIVE 54 yo with hx of dilated CM. MRI showed no infiltrative process.  Cath - normal coronaries.   Denies any CP or SOB. Feeling well  CURRENT MEDS . aspirin EC  81 mg Oral Daily  . atorvastatin  10 mg Oral q1800  . carvedilol  3.125 mg Oral BID WC  . enoxaparin (LOVENOX) injection  40 mg Subcutaneous Q24H  . FLUoxetine  20 mg Oral Daily  . furosemide  20 mg Intravenous BID  . gabapentin  300 mg Oral BID  . gabapentin  600 mg Oral QHS  . insulin aspart  0-9 Units Subcutaneous TID WC  . levothyroxine  88 mcg Oral QAC breakfast  . Liraglutide  1.8 mg Subcutaneous Daily  . lisinopril  20 mg Oral Daily  . sodium chloride  3 mL Intravenous Q12H  . sodium chloride  3 mL Intravenous Q12H  . sodium chloride  3 mL Intravenous Q12H    OBJECTIVE  Filed Vitals:   09/16/15 2054 09/17/15 0231 09/17/15 0543 09/17/15 0849  BP: 97/74 128/67 126/82 112/71  Pulse: 94 88 91 95  Temp: 98 F (36.7 C) 98 F (36.7 C) 98.1 F (36.7 C) 98.7 F (37.1 C)  TempSrc: Oral Oral Oral Oral  Resp: 17 20 17    Height:      Weight:   163 lb 12.8 oz (74.299 kg)   SpO2: 96% 98% 94% 97%    Intake/Output Summary (Last 24 hours) at 09/17/15 1044 Last data filed at 09/17/15 0833  Gross per 24 hour  Intake   1140 ml  Output   1400 ml  Net   -260 ml   Filed Weights   09/15/15 0352 09/16/15 0524 09/17/15 0543  Weight: 166 lb 12.8 oz (75.66 kg) 163 lb 9.6 oz (74.208 kg) 163 lb 12.8 oz (74.299 kg)    PHYSICAL EXAM  General: Pleasant, NAD. Neuro: Alert and oriented X 3. Moves all extremities spontaneously. Psych: Normal affect. HEENT:  Normal  Neck: Supple  without bruits or JVD. Lungs:  Resp regular and unlabored. Largely clear to auscultation, mildly diminished breath sound in bilateral bases, but otherwise no significant rale.  Heart: RRR no s3, s4, or murmurs. Abdomen: Soft, non-tender, non-distended, BS + x 4.  Extremities: No clubbing, cyanosis or edema. DP/PT/Radials 2+ and equal bilaterally.  Accessory Clinical Findings  CBC  Recent Labs  09/14/15 1820  09/15/15 0542 09/16/15 1320  WBC 12.4*  < > 10.8* 9.6  NEUTROABS 9.2*  --  7.6  --   HGB 11.6*  < > 12.0 12.5  HCT 35.8*  < > 37.0 37.5  MCV 88.0  < > 88.7 87.4  PLT 385  < > 348 334  < > = values in this interval not displayed. Basic Metabolic Panel  Recent Labs  09/15/15 0031  09/16/15 0334 09/16/15 1320 09/17/15 0339  NA 138  < > 137  --  136  K 3.7  < > 3.4*  --  3.8  CL 102  < > 100*  --  102  CO2 25  < > 25  --  24  GLUCOSE 135*  < > 130*  --  135*  BUN 13  < > 16  --  14  CREATININE 1.05*  < > 1.13* 0.99 0.91  CALCIUM 9.2  < > 8.7*  --  8.7*  MG 1.7  --   --   --   --   PHOS 4.7*  --   --   --   --   < > = values in this interval not displayed. Liver Function Tests  Recent Labs  09/15/15 0542  AST 147*  ALT 154*  ALKPHOS 75  BILITOT 0.6  PROT 6.7  ALBUMIN 3.7   Cardiac Enzymes  Recent Labs  09/15/15 0031 09/15/15 0542 09/15/15 1227  TROPONINI 0.10* 0.10* 0.08*   BNP Invalid input(s): POCBNP D-Dimer  Recent Labs  09/14/15 1820  DDIMER 1.72*   Thyroid Function Tests  Recent Labs  09/16/15 0334  TSH 1.990    TELE Sinus tach with HR high 90s    ECG  No new EKG  Echocardiogram 09/15/2015  LV EF: 20%  ------------------------------------------------------------------- Indications:   CHF - 428.0.  ------------------------------------------------------------------- History:  Risk factors: Hypertension. Diabetes mellitus.  ------------------------------------------------------------------- Study Conclusions  -  Left ventricle: The cavity size was severely dilated. Wall thickness was normal. The estimated ejection fraction was 20%. Diffuse hypokinesis. - Mitral valve: There was mild regurgitation. - Left atrium: The atrium was mildly dilated. - Pulmonary arteries: PA peak pressure: 37 mm Hg (S). - Pericardium, extracardiac: A trivial pericardial effusion was identified posterior to the heart.  Impressions:  - Global L. strain =-7.8.    Radiology/Studies    ASSESSMENT AND PLAN  1. Acute systolic HF with newly diagnosed LV dysfunction - Unclear cause, no history of EtOH abuse, no history of chemotherapy, history of hypothyroidism noted, check TSH, does have hypertension, however systolic blood pressure has been relatively controlled. Current tachycardia is likely related to cardiomyopathy and heart acute heart failure.  Participating in a study to assess volume.  Increase coreg to 6. 25 bid today Continue Lisinopril Continue lasix   DC to home today   2. Elevated d-dimer: CTA of chest negative for PE  3. Mildly AKI  4. HTN 5. HLD 6. DMII 7. Hypothyroidism: will check TSH tomorrow AM 8. Fibromyalgia     Nahser, Deloris Ping, MD  09/17/2015 10:56 AM    [facility] [address] [place],  [address] [place], [place]  [address] Pager [phone] Phone: [phone]; Fax: [phone]   [facility]  [address] [place], [place]  [address] [phone]   Fax [phone]

## 2015-09-19 ENCOUNTER — Telehealth (HOSPITAL_COMMUNITY): Payer: Self-pay | Admitting: Surgery

## 2015-09-19 MED FILL — Nitroglycerin IV Soln 100 MCG/ML in D5W: INTRA_ARTERIAL | Qty: 10 | Status: AC

## 2015-09-19 NOTE — Telephone Encounter (Signed)
Heart Failure Nurse Navigator Post Discharge Telephone Call  I called to check on Robin Owen after her recent hospitalization and new diagnosis of HF.  She also had a referral order placed to the [facility].  She says that she is weighing daily and weight today was 165 lbs versus her discharge weight of 163.8 lbs on 11/19.  She has been avoiding high sodium foods and understands how weighing and sodium intake relate to her HF.  She denies any issues with getting or taking prescribed medications.  I did make a follow-up appt with Amy Clegg in the [facility] on 09/29/15 at 1040 for hosp follow-up.  She understands and plans to be there for the appt.  I have also encouraged her to call me with any questions or concerns related to her HF.

## 2015-09-29 ENCOUNTER — Other Ambulatory Visit (HOSPITAL_COMMUNITY): Payer: Self-pay | Admitting: Adult Health

## 2015-09-29 ENCOUNTER — Ambulatory Visit (HOSPITAL_COMMUNITY)
Admission: RE | Admit: 2015-09-29 | Discharge: 2015-09-29 | Disposition: A | Discharge status: Home / Self Care | Payer: BLUE CROSS/BLUE SHIELD | Source: Ambulatory Visit | Attending: Internal Medicine | Admitting: Internal Medicine

## 2015-09-29 VITALS — BP 112/80 | HR 99 | Wt 171.4 lb

## 2015-09-29 DIAGNOSIS — E039 Hypothyroidism, unspecified: Secondary | ICD-10-CM | POA: Diagnosis not present

## 2015-09-29 DIAGNOSIS — E785 Hyperlipidemia, unspecified: Secondary | ICD-10-CM | POA: Insufficient documentation

## 2015-09-29 DIAGNOSIS — I428 Other cardiomyopathies: Secondary | ICD-10-CM

## 2015-09-29 DIAGNOSIS — Z79899 Other long term (current) drug therapy: Secondary | ICD-10-CM | POA: Diagnosis not present

## 2015-09-29 DIAGNOSIS — I159 Secondary hypertension, unspecified: Secondary | ICD-10-CM | POA: Diagnosis not present

## 2015-09-29 DIAGNOSIS — E119 Type 2 diabetes mellitus without complications: Secondary | ICD-10-CM | POA: Diagnosis not present

## 2015-09-29 DIAGNOSIS — I5021 Acute systolic (congestive) heart failure: Secondary | ICD-10-CM | POA: Diagnosis not present

## 2015-09-29 DIAGNOSIS — Z7984 Long term (current) use of oral hypoglycemic drugs: Secondary | ICD-10-CM | POA: Diagnosis not present

## 2015-09-29 DIAGNOSIS — M797 Fibromyalgia: Secondary | ICD-10-CM

## 2015-09-29 DIAGNOSIS — I5022 Chronic systolic (congestive) heart failure: Secondary | ICD-10-CM | POA: Insufficient documentation

## 2015-09-29 DIAGNOSIS — I429 Cardiomyopathy, unspecified: Secondary | ICD-10-CM | POA: Diagnosis not present

## 2015-09-29 DIAGNOSIS — I10 Essential (primary) hypertension: Secondary | ICD-10-CM | POA: Insufficient documentation

## 2015-09-29 DIAGNOSIS — R0602 Shortness of breath: Secondary | ICD-10-CM | POA: Diagnosis not present

## 2015-09-29 DIAGNOSIS — I420 Dilated cardiomyopathy: Secondary | ICD-10-CM | POA: Insufficient documentation

## 2015-09-29 DIAGNOSIS — E038 Other specified hypothyroidism: Secondary | ICD-10-CM

## 2015-09-29 DIAGNOSIS — I517 Cardiomegaly: Secondary | ICD-10-CM | POA: Insufficient documentation

## 2015-09-29 LAB — BASIC METABOLIC PANEL
Anion gap: 9 (ref 5–15)
BUN: 19 mg/dL (ref 6–20)
CO2: 27 mmol/L (ref 22–32)
Calcium: 9.1 mg/dL (ref 8.9–10.3)
Chloride: 97 mmol/L — ABNORMAL LOW (ref 101–111)
Creatinine, Ser: 1.09 mg/dL — ABNORMAL HIGH (ref 0.44–1.00)
GFR calc Af Amer: >60 mL/min (ref >60)
GFR calc non Af Amer: 58 mL/min — ABNORMAL LOW (ref >60)
Glucose, Bld: 102 mg/dL — ABNORMAL HIGH (ref 65–99)
Potassium: 4.5 mmol/L (ref 3.5–5.1)
Sodium: 133 mmol/L — ABNORMAL LOW (ref 135–145)

## 2015-09-29 MED ORDER — SPIRONOLACTONE 25 MG PO TABS: 12.5000 mg | ORAL_TABLET | Freq: Every day | ORAL | Status: DC

## 2015-09-29 NOTE — Progress Notes (Signed)
Advanced Heart Failure Medication Review by a Pharmacist  Does the patient  feel that his/her medications are working for him/her?  yes  Has the patient been experiencing any side effects to the medications prescribed?  no  Does the patient measure his/her own blood pressure or blood glucose at home?  yes   Does the patient have any problems obtaining medications due to transportation or finances?   no  Understanding of regimen: good Understanding of indications: good Potential of compliance: excellent Patient understands to avoid NSAIDs. Patient understands to avoid decongestants.  Issues to address at subsequent visits: None   Pharmacist comments:  Ms. Stembridge is a pleasant 54 yo F presenting with her medication bottles. She has a great understanding of her regimen and reports excellent compliance. She did not have any specific medication-related questions or concerns for me at this time.  [name]. Bonnye Fava, PharmD, BCPS, CPP Clinical Pharmacist Pager: [phone] Phone: [phone] 09/29/2015 11:06 AM      Time with patient: 8 minutes Preparation and documentation time: 2 minutes Total time: 10 minutes

## 2015-09-29 NOTE — Progress Notes (Signed)
Patient ID: Robin Owen, female   DOB: 02-24-1961, 54 y.o.   MRN: [id number] PCP: Dr Altheimer Primary HF Cardiologist:  HPI: Robin Owen is a 54 year old Caucasian female with past medical history of HTN, HLD, DMII, hypothyroidism and fibromyalgia . No history of cancer.   Admitted 11/16 through 09/17/15 with increased dyspnea. CXR with pulmonary edema. This prompted and ECHO that showed reduced EF 20%. RHC/LHC showed normal cors and reduced cardiac index. 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 She has been lisinopril for some time. Started on coreg and lasix.  Discharge weight was 163 pounds.   Today she presents as a new patient for heart failure. SOB with steps. Denies orthopnea/pnd. Weight at home 163-168 pounds. Does exercise video one-two times a week. Taking all medications. Lives at home with husband. Does not work. Does not smoke or drink alcohol.   Test/Procedures  09/15/2015 ECHO EF 20% MV mild regurgitation TV mild regurgitation  09/19/2015 CMRI 1. Severely dilated left ventricle with normal wall thickness and severe systolic dysfunction (LVEF 20-25%) with global hypokinesis and akinesis of the entire septal wall and diffuse mid wall late gadolinium enhancement. 2. Moderate RV dilatation with mild moderate systolic dysfunction. 3. Moderate left and right atrial dilatation. 4. Mild to moderate mitral and mild tricuspid regurgitation. 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  RHC/LHC 09/15/2017  RA 7 PCWP 29 CO 3.11 CI 1.7  Normal Cors  Labs 09/15/2015: BNP 2354 Labs 09/16/2015: TSH 1.99 Labs 09/17/2015: K 3.8 Creatinine 0.91  SH: Does drink or smoke. Disabled 1996 for fibriomyalgia. Has 2 grown childlren.   ROS: All systems negative except as listed in HPI, PMH and Problem List.  SH:  Social History   Social History  . Marital Status: Married    Spouse Name: N/A  . Number of Children: N/A  . Years  of Education: N/A   Occupational History  . Not on file.   Social History Main Topics  . Smoking status: Never Smoker   . Smokeless tobacco: Never Used  . Alcohol Use: No  . Drug Use: No  . Sexual Activity: Yes    Birth Control/ Protection: None   Other Topics Concern  . Not on file   Social History Narrative    FH:  Family History  Problem Relation Age of Onset  . Pulmonary fibrosis Mother   . Hypertension Mother   . Atrial fibrillation Mother   . Hypertension Father   . Diabetes Mellitus II Father   . Kidney disease Father   . Diabetes Mellitus II Brother   . Hypertension Brother   . Atrial fibrillation Maternal Grandmother   . Hypertension Maternal Grandmother   . Stroke Maternal Grandmother     Past Medical History  Diagnosis Date  . Diabetes mellitus without complication (HCC)   . Hypertension   . Fibromyalgia   . Hypothyroidism   . Hyperlipidemia     Current Outpatient Prescriptions  Medication Sig Dispense Refill  . atorvastatin (LIPITOR) 10 MG tablet Take 10 mg by mouth daily.    . Calcium Carbonate-Vitamin D (CALTRATE 600+D) 600-400 MG-UNIT tablet Take 1 tablet by mouth daily.    . carvedilol (COREG) 6.25 MG tablet Take 1 tablet (6.25 mg total) by mouth 2 (two) times daily with a meal. 60 tablet 0  . cyclobenzaprine (FLEXERIL) 10 MG tablet Take 10 mg by mouth 2 (two) times daily as needed for muscle spasms.    Marland Kitchen FLUoxetine (PROZAC) 20 MG tablet Take 20  mg by mouth daily.    . furosemide (LASIX) 40 MG tablet Take 1 tablet (40 mg total) by mouth 2 (two) times daily. 30 tablet 0  . gabapentin (NEURONTIN) 600 MG tablet Take 300-600 mg by mouth 3 (three) times daily as needed (for fibromyalgia). Take 1/2 tablet (300mg ) by mouth in the morning, and afternonn, and 1 tablet (600mg ) at bedtime    . glimepiride (AMARYL) 2 MG tablet Take 2 mg by mouth daily as needed. Take if BG >160  1  . levothyroxine (SYNTHROID, LEVOTHROID) 88 MCG tablet Take 88 mcg by mouth  daily before breakfast.    . Liraglutide (VICTOZA) 18 MG/3ML SOPN Inject 1.8 mg into the skin daily.    Marland Kitchen lisinopril (PRINIVIL,ZESTRIL) 20 MG tablet Take 20 mg by mouth daily.    . metFORMIN (GLUCOPHAGE) 1000 MG tablet Take 1 tablet (1,000 mg total) by mouth 2 (two) times daily with a meal.    . Methylcellulose, Laxative, (MIRAFIBER) 500 MG TABS Take 500 mg by mouth daily.    . Multiple Vitamin (MULTIVITAMIN WITH MINERALS) TABS tablet Take 1 tablet by mouth daily.    . potassium chloride SA (K-DUR,KLOR-CON) 20 MEQ tablet Take 1 tablet (20 mEq total) by mouth 2 (two) times daily. 60 tablet 0  . traMADol (ULTRAM) 50 MG tablet Take 50 mg by mouth every 6 (six) hours as needed for moderate pain.     No current facility-administered medications for this encounter.    Filed Vitals:   09/29/15 1054  BP: 112/80  Pulse: 99  Weight: 171 lb 6.4 oz (77.747 kg)  SpO2: 100%    PHYSICAL EXAM:  General:  Well appearing. No resp difficulty. Ambulated in the clinic without difficulty HEENT: normal Neck: supple. JVP flat. Carotids 2+ bilaterally; no bruits. No lymphadenopathy or thryomegaly appreciated. Cor: PMI normal. Regular rate & rhythm. No rubs, gallops or murmurs. Lungs: clear Abdomen: soft, nontender, nondistended. No hepatosplenomegaly. No bruits or masses. Good bowel sounds. Extremities: no cyanosis, clubbing, rash, edema Neuro: alert & orientedx3, cranial nerves grossly intact. Moves all 4 extremities w/o difficulty. Affect pleasant.  ASSESSMENT & PLAN: 1. Chronic Systolic Heart Failure- 09/15/2015 ECHO EF 20%. NICM perhaps related to HTN. 09/16/2015 RHC/LHC coronaries ok. Cardiac Output 3.1 Low cardiac index 1.7  NYHA II. Volume status stable. Continue lasix 40 mg twice a day. Today I will add 12.5 mg spiro.  Continue coreg 6.25 mg twice a day.  Continue lisinopril 20 mg daily. Can think about entresto at next visit.  Today we discussed low diet, limiting fluid intake to < 2 liters per  day, and daily weights. Provided  With weight chart.  Plan to repeat ECHO after HF meds optimized. Narrow QRS noted on EKG 09/16/15.  2. HTN - Stable. Continue current regimen and add 12.5 spiro.  3. Fibromyalgia-Disabled 1996  4. Hypothyroidism - On synthroid. TSH ok on most recent lab work. Per PCP 5. DM II- on amaryl and metformin. Per PCP Check BMET today --> K 4.5 Creatinine 1.09 - with addition of spiro. Stop K. Repeat BMET next week.  Follow up in 2 weeks.    Amy Clegg NP-C 8:01 AM

## 2015-09-29 NOTE — Patient Instructions (Signed)
Labs today and in two weeks  Start Spironolactone 12.5 mg daily  Follow up in 2 weeks with Amy NP

## 2015-10-02 DIAGNOSIS — I5022 Chronic systolic (congestive) heart failure: Secondary | ICD-10-CM | POA: Insufficient documentation

## 2015-10-04 ENCOUNTER — Ambulatory Visit (INDEPENDENT_AMBULATORY_CARE_PROVIDER_SITE_OTHER): Payer: BLUE CROSS/BLUE SHIELD | Admitting: Cardiology

## 2015-10-04 ENCOUNTER — Encounter: Payer: Self-pay | Admitting: Cardiology

## 2015-10-04 VITALS — BP 90/68 | HR 98 | Ht 65.0 in | Wt 169.8 lb

## 2015-10-04 DIAGNOSIS — I429 Cardiomyopathy, unspecified: Secondary | ICD-10-CM

## 2015-10-04 NOTE — Progress Notes (Signed)
Cardiology Office Note   Date:  10/04/2015   ID:  Robin Owen, DOB [birth year], MRN [id number]  PCP:  Robin Silk, MD  Cardiologist:   Dr. Delton Owen   Chief Complaint  Patient presents with  . Shortness of Breath  . Cardiomyopathy      History of Present Illness: Robin Owen is a 54 y.o. female who presents for post hospital visit for acute systolic HF with EF 20%.  Mild MR, a trivial pericardial effusion, and PA pk pressure 37 per cath. NICM.  Cardiac cath :   There is severe left ventricular systolic dysfunction.  1. Normal coronary anatomy 2. Severe LV dysfunction. EF <25% 3. Mild pulmonary HTN   Plan: medical therapy  Wt at d/c 163 down from 171 on admit.   Previous hx of DM 2, HTN, hypothyroidism, HLP and fibromyalgia.  CT chest neg for PE.  MRI without infiltrative process.    D/c'd on coreg, lasix, lisinopril and K+.    Past Medical History  Diagnosis Date  . Diabetes mellitus without complication (HCC)   . Hypertension   . Fibromyalgia   . Hypothyroidism   . Hyperlipidemia     Past Surgical History  Procedure Laterality Date  . Endometrial ablation  2012  . Cardiac catheterization N/A 09/16/2015    Procedure: Right/Left Heart Cath and Coronary Angiography;  Surgeon: Robin M Swaz[place], MD;  Location: [facility] INVASIVE CV LAB;  Service: Cardiovascular;  Laterality: N/A;     Current Outpatient Prescriptions  Medication Sig Dispense Refill  . atorvastatin (LIPITOR) 10 MG tablet Take 10 mg by mouth daily.    . Calcium Carbonate-Vitamin D (CALTRATE 600+D) 600-400 MG-UNIT tablet Take 1 tablet by mouth daily.    . carvedilol (COREG) 6.25 MG tablet Take 1 tablet (6.25 mg total) by mouth 2 (two) times daily with a meal. 60 tablet 0  . cyclobenzaprine (FLEXERIL) 10 MG tablet Take 10 mg by mouth 2 (two) times daily as needed for muscle spasms.    Marland Kitchen FLUoxetine (PROZAC) 20 MG tablet Take 20 mg by mouth daily.    . furosemide (LASIX) 40 MG tablet TAKE 1  TABLET (40 MG TOTAL) BY MOUTH 2 (TWO) TIMES DAILY. 30 tablet 3  . gabapentin (NEURONTIN) 600 MG tablet Take 300-600 mg by mouth 3 (three) times daily as needed (for fibromyalgia). Take 1/2 tablet (300mg ) by mouth in the morning, and afternonn, and 1 tablet (600mg ) at bedtime    . glimepiride (AMARYL) 2 MG tablet Take 2 mg by mouth daily as needed. Take if BG >160  1  . levothyroxine (SYNTHROID, LEVOTHROID) 88 MCG tablet Take 88 mcg by mouth daily before breakfast.    . Liraglutide (VICTOZA) 18 MG/3ML SOPN Inject 1.8 mg into the skin daily.    Marland Kitchen lisinopril (PRINIVIL,ZESTRIL) 20 MG tablet Take 20 mg by mouth daily.    . metFORMIN (GLUCOPHAGE) 1000 MG tablet Take 1 tablet (1,000 mg total) by mouth 2 (two) times daily with a meal.    . Methylcellulose, Laxative, (MIRAFIBER) 500 MG TABS Take 500 mg by mouth daily.    . Multiple Vitamin (MULTIVITAMIN WITH MINERALS) TABS tablet Take 1 tablet by mouth daily.    Marland Kitchen NOVOTWIST 32G X 5 MM MISC Use as directed with Victozia.  2  . ONETOUCH VERIO test strip Check blood glucose once daily  2  . potassium chloride SA (K-DUR,KLOR-CON) 20 MEQ tablet Take 1 tablet (20 mEq total) by mouth 2 (two) times daily. 60 tablet  0  . spironolactone (ALDACTONE) 25 MG tablet Take 0.5 tablets (12.5 mg total) by mouth daily. 90 tablet 3  . traMADol (ULTRAM) 50 MG tablet Take 50 mg by mouth every 6 (six) hours as needed for moderate pain.     No current facility-administered medications for this visit.    Allergies:   Review of patient's allergies indicates no known allergies.    Social History:  The patient  reports that she has never smoked. She has never used smokeless tobacco. She reports that she does not drink alcohol or use illicit drugs.   Family History:  The patient's family history includes Atrial fibrillation in her maternal grandmother and mother; Diabetes Mellitus II in her brother and father; Hypertension in her brother, father, maternal grandmother, and mother;  Kidney disease in her father; Pulmonary fibrosis in her mother; Stroke in her maternal grandmother.    ROS:  General:no colds or fevers, no weight changes Skin:no rashes or ulcers HEENT:no blurred vision, no congestion CV:Owen HPI PUL:Owen HPI GI:no diarrhea constipation or melena, no indigestion GU:no hematuria, no dysuria MS:no joint pain, no claudication Neuro:no syncope, no lightheadedness Endo:no diabetes, no thyroid disease Wt Readings from Last 3 Encounters:  10/04/15 169 lb 12.8 oz (77.021 kg)  09/29/15 171 lb 6.4 oz (77.747 kg)  09/17/15 163 lb 12.8 oz (74.299 kg)     PHYSICAL EXAM: VS:  BP 90/68 mmHg  Pulse 98  Ht  (1.651 m)  Wt 169 lb 12.8 oz (77.021 kg)  BMI 28.26 kg/m2  SpO2 96% , BMI Body mass index is 28.26 kg/(m^2).   Recent Labs: 09/15/2015: ALT 154*; B Natriuretic Peptide 2354.7*; Magnesium 1.7 09/16/2015: Hemoglobin 12.5; Platelets 334; TSH 1.990 09/29/2015: BUN 19; Creatinine, Ser 1.09*; Potassium 4.5; Sodium 133*    Lipid Panel No results found for: CHOL, TRIG, HDL, CHOLHDL, VLDL, LDLCALC, LDLDIRECT     Other studies Reviewed: Additional studies/ records that were reviewed today include:    ASSESSMENT AND PLAN:  1.  NICM on BB ACE, lasix   Pt seen last week with HF clinic.  And is to be seen next week by HF.  I do not need to Owen pt today- will let Amy know BP.  Will ask HF to send pt to [place] when they believe is needed.   Current medicines are reviewed with the patient today.  The patient Has no concerns regarding medicines.  The following changes have been made:  Owen above Labs/ tests ordered today include:Owen above  Disposition:   FU:  Owen above  Signed, Robin Brand, NP  10/04/2015 1:37 PM    [facility] [address] [place], [place], [place]  [address]/ [address] [place], [place] Phone: [phone]; Fax: [phone]  [phone]

## 2015-10-04 NOTE — Patient Instructions (Signed)
Medication Instructions:  Your physician recommends that you continue on your current medications as directed. Please refer to the Current Medication list given to you today.   Labwork: NONE  Testing/Procedures: NONE  Follow-Up: Your physician recommends that you schedule a follow-up appointment in:  KEEP  APPOINTMENT   WITH  HEART  FAILURE CLINIC  AS  SCHEDULED  Any Other Special Instructions Will Be Listed Below (If Applicable).     If you need a refill on your cardiac medications before your next appointment, please call your pharmacy.

## 2015-10-13 ENCOUNTER — Ambulatory Visit (HOSPITAL_COMMUNITY)
Admission: RE | Admit: 2015-10-13 | Discharge: 2015-10-13 | Disposition: A | Discharge status: Home / Self Care | Payer: BLUE CROSS/BLUE SHIELD | Source: Ambulatory Visit | Attending: Internal Medicine | Admitting: Internal Medicine

## 2015-10-13 VITALS — BP 116/62 | HR 101 | Wt 169.0 lb

## 2015-10-13 DIAGNOSIS — M797 Fibromyalgia: Secondary | ICD-10-CM | POA: Diagnosis not present

## 2015-10-13 DIAGNOSIS — E119 Type 2 diabetes mellitus without complications: Secondary | ICD-10-CM | POA: Insufficient documentation

## 2015-10-13 DIAGNOSIS — I110 Hypertensive heart disease with heart failure: Secondary | ICD-10-CM | POA: Diagnosis not present

## 2015-10-13 DIAGNOSIS — I429 Cardiomyopathy, unspecified: Secondary | ICD-10-CM

## 2015-10-13 DIAGNOSIS — I158 Other secondary hypertension: Secondary | ICD-10-CM

## 2015-10-13 DIAGNOSIS — I5022 Chronic systolic (congestive) heart failure: Secondary | ICD-10-CM

## 2015-10-13 DIAGNOSIS — E038 Other specified hypothyroidism: Secondary | ICD-10-CM

## 2015-10-13 DIAGNOSIS — I428 Other cardiomyopathies: Secondary | ICD-10-CM

## 2015-10-13 DIAGNOSIS — E039 Hypothyroidism, unspecified: Secondary | ICD-10-CM | POA: Diagnosis not present

## 2015-10-13 MED ORDER — CARVEDILOL 6.25 MG PO TABS: 6.2500 mg | ORAL_TABLET | Freq: Two times a day (BID) | ORAL | Status: DC

## 2015-10-13 MED ORDER — DIGOXIN 125 MCG PO TABS: 0.1250 mg | ORAL_TABLET | Freq: Every day | ORAL | Status: DC

## 2015-10-13 NOTE — Patient Instructions (Signed)
Follow up 4-5 weeks  Stop Potassium   Take digoxin 0.125 mg daily.   Do the following things EVERYDAY: 1) Weigh yourself in the morning before breakfast. Write it down and keep it in a log. 2) Take your medicines as prescribed 3) Eat low salt foods-Limit salt (sodium) to 2000 mg per day.  4) Stay as active as you can everyday 5) Limit all fluids for the day to less than 2 liters

## 2015-10-13 NOTE — Progress Notes (Signed)
Advanced Heart Failure Medication Review by a Pharmacist  Does the patient  feel that his/her medications are working for him/her?  yes  Has the patient been experiencing any side effects to the medications prescribed?  no  Does the patient measure his/her own blood pressure or blood glucose at home?  yes   Does the patient have any problems obtaining medications due to transportation or finances?   no  Understanding of regimen: good Understanding of indications: good Potential of compliance: excellent Patient understands to avoid NSAIDs. Patient understands to avoid decongestants.  Issues to address at subsequent visits: None   Pharmacist comments:  Robin Owen is a pleasant 54 yo F presenting with her HF medication bottles. She reports excellent compliance with her medications but states that her last dose of potassium was on Tuesday morning since her endocrinologist told her to stop taking it. She did not have any other medication-related questions or concerns for me at this time.   [name]. Bonnye Fava, PharmD, BCPS, CPP Clinical Pharmacist Pager: [phone] Phone: [phone] 10/13/2015 1:55 PM      Time with patient: 6 minutes Preparation and documentation time: 2 minutes Total time: 8 minutes

## 2015-10-13 NOTE — Progress Notes (Signed)
Patient ID: Robin Owen, female   DOB: Jul 31, 1961, 54 y.o.   MRN: [id number] PCP: Dr Altheimer Primary HF Cardiologist:  HPI: Elaina Hoops is a 54 year old Caucasian female with past medical history of HTN, HLD, DMII, hypothyroidism and fibromyalgia . No history of cancer.   Admitted 11/16 through 09/17/15 with increased dyspnea. CXR with pulmonary edema. This prompted and ECHO that showed reduced EF 20%. RHC/LHC showed normal cors and reduced cardiac index. 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 She has been lisinopril for some time. Started on coreg and lasix.  Discharge weight was 163 pounds.   Today she returns for follow up. Last visit 12.5 mg spiro added and potassium was stopped.  Overall feels good. Denies SOB/PND/Orthopne.  SOB with steps. Takes her time walking in the grocery store. Mild dyspnea if she walks fast. Weight at home 163-167pounds. Does exercise video one-two times a week. Taking all medications. Lives at home with husband. Does not work. Does not smoke or drink alcohol.   Test/Procedures  09/15/2015 ECHO EF 20% MV mild regurgitation TV mild regurgitation  09/19/2015 CMRI 1. Severely dilated left ventricle with normal wall thickness and severe systolic dysfunction (LVEF 20-25%) with global hypokinesis and akinesis of the entire septal wall and diffuse mid wall late gadolinium enhancement. 2. Moderate RV dilatation with mild moderate systolic dysfunction. 3. Moderate left and right atrial dilatation. 4. Mild to moderate mitral and mild tricuspid regurgitation. 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  RHC/LHC 09/15/2017  RA 7 PCWP 29 CO 3.11 CI 1.7  Normal Cors  Labs 09/15/2015: BNP 2354 Labs 09/16/2015: TSH 1.99 Labs 09/17/2015: K 3.8 Creatinine 0.91 Labs 09/29/2015: K 4.5 Creatinine 1.09 Labs 10/05/2015: K 5.3 Creatinine 1.21 from Dr Mindi Junker   [facility]: Does drink or smoke. Disabled 1996 for fibriomyalgia. Has  2 grown childlren.   ROS: All systems negative except as listed in HPI, PMH and Problem List.  SH:  Social History   Social History  . Marital Status: Married    Spouse Name: N/A  . Number of Children: N/A  . Years of Education: N/A   Occupational History  . Not on file.   Social History Main Topics  . Smoking status: Never Smoker   . Smokeless tobacco: Never Used  . Alcohol Use: No  . Drug Use: No  . Sexual Activity: Yes    Birth Control/ Protection: None   Other Topics Concern  . Not on file   Social History Narrative    FH:  Family History  Problem Relation Age of Onset  . Pulmonary fibrosis Mother   . Hypertension Mother   . Atrial fibrillation Mother   . Hypertension Father   . Diabetes Mellitus II Father   . Kidney disease Father   . Diabetes Mellitus II Brother   . Hypertension Brother   . Atrial fibrillation Maternal Grandmother   . Hypertension Maternal Grandmother   . Stroke Maternal Grandmother     Past Medical History  Diagnosis Date  . Diabetes mellitus without complication (HCC)   . Hypertension   . Fibromyalgia   . Hypothyroidism   . Hyperlipidemia     Current Outpatient Prescriptions  Medication Sig Dispense Refill  . atorvastatin (LIPITOR) 10 MG tablet Take 10 mg by mouth daily.    . Calcium Carbonate-Vitamin D (CALTRATE 600+D) 600-400 MG-UNIT tablet Take 1 tablet by mouth daily.    . carvedilol (COREG) 6.25 MG tablet Take 1 tablet (6.25 mg total) by mouth 2 (two)  times daily with a meal. 60 tablet 0  . cyclobenzaprine (FLEXERIL) 10 MG tablet Take 10 mg by mouth 2 (two) times daily as needed for muscle spasms.    Marland Kitchen FLUoxetine (PROZAC) 20 MG tablet Take 20 mg by mouth daily.    . furosemide (LASIX) 40 MG tablet TAKE 1 TABLET (40 MG TOTAL) BY MOUTH 2 (TWO) TIMES DAILY. 30 tablet 3  . gabapentin (NEURONTIN) 600 MG tablet Take 300-600 mg by mouth 3 (three) times daily as needed (for fibromyalgia). Take 1/2 tablet ( ) by mouth in the  morning, and afternonn, and 1 tablet ( ) at bedtime    . glimepiride (AMARYL) 2 MG tablet Take 2 mg by mouth daily as needed. Take if BG >160  1  . levothyroxine (SYNTHROID, LEVOTHROID) 88 MCG tablet Take 88 mcg by mouth daily before breakfast.    . Liraglutide (VICTOZA) 18 MG/3ML SOPN Inject 1.8 mg into the skin daily.    Marland Kitchen lisinopril (PRINIVIL,ZESTRIL) 20 MG tablet Take 20 mg by mouth daily.    . metFORMIN (GLUCOPHAGE) 1000 MG tablet Take 1 tablet (1,000 mg total) by mouth 2 (two) times daily with a meal.    . Methylcellulose, Laxative, (MIRAFIBER) 500 MG TABS Take 500 mg by mouth daily.    . Multiple Vitamin (MULTIVITAMIN WITH MINERALS) TABS tablet Take 1 tablet by mouth daily.    Marland Kitchen NOVOTWIST 32G X 5 MM MISC Use as directed with Victozia.  2  . ONETOUCH VERIO test strip Check blood glucose once daily  2  . potassium chloride SA (K-DUR,KLOR-CON) 20 MEQ tablet Take 1 tablet (20 mEq total) by mouth 2 (two) times daily. 60 tablet 0  . spironolactone (ALDACTONE) 25 MG tablet Take 0.5 tablets (12.5 mg total) by mouth daily. 90 tablet 3  . traMADol (ULTRAM) 50 MG tablet Take 50 mg by mouth every 6 (six) hours as needed for moderate pain.     No current facility-administered medications for this encounter.    Filed Vitals:   10/13/15 1342  BP: 116/62  Pulse: 101  Weight: 169 lb (76.658 kg)  SpO2: 98%    PHYSICAL EXAM:  General:  Well appearing. No resp difficulty. Ambulated in the clinic without difficulty HEENT: normal Neck: supple. JVP flat. Carotids 2+ bilaterally; no bruits. No lymphadenopathy or thryomegaly appreciated. Cor: PMI normal. Regular rate & rhythm. No rubs, gallops or murmurs. Lungs: clear Abdomen: soft, nontender, nondistended. No hepatosplenomegaly. No bruits or masses. Good bowel sounds. Extremities: no cyanosis, clubbing, rash, edema Neuro: alert & orientedx3, cranial nerves grossly intact. Moves all 4 extremities w/o difficulty. Affect pleasant.  ASSESSMENT &  PLAN: 1. Chronic Systolic Heart Failure- 09/15/2015 ECHO EF 20%. Had cMRI EF 20-25%  RV mod dilated. NICM perhaps related to HTN. 09/16/2015 RHC/LHC coronaries ok. Cardiac Output 3.1 Low cardiac index 1.7  NYHA II-III. Volume status stable. Continue lasix 40 mg twice a day. Continue 12.5 mg spiro daily.  Keep off potassium  Continue coreg to 9.375 mg twice a day.  Add 0.125 mg digoxin daily.  Continue lisinopril 20 mg daily. Hold off on entresto for now. Most recent K  5.3 on 12/7. Today I am stopping potassium.  Today we discussed low diet, limiting fluid intake to < 2 liters per day, and daily weights. Provided  With weight chart.  Plan to repeat ECHO after HF meds optimized. Narrow QRS noted on EKG 09/16/15 so she would not be a candidate for CRT-D .  2. HTN - Stable. Continue current  regimen and add 12.5 spiro.  3. Fibromyalgia-Disabled 1996  4. Hypothyroidism - On synthroid. TSH ok on most recent lab work. Per PCP 5. DM II- on amaryl and metformin. Per PCP  Follow up in  4-5 weeks on MD side.     Amy Clegg NP-C 1:41 PM

## 2015-10-14 ENCOUNTER — Other Ambulatory Visit (HOSPITAL_COMMUNITY): Payer: Self-pay | Admitting: Adult Health

## 2015-10-18 ENCOUNTER — Other Ambulatory Visit (HOSPITAL_COMMUNITY): Payer: Self-pay | Admitting: *Deleted

## 2015-10-18 MED ORDER — FUROSEMIDE 40 MG PO TABS: ORAL_TABLET | ORAL | Status: DC

## 2015-11-17 ENCOUNTER — Encounter (HOSPITAL_COMMUNITY): Payer: Self-pay

## 2015-11-17 ENCOUNTER — Ambulatory Visit (HOSPITAL_COMMUNITY)
Admission: RE | Admit: 2015-11-17 | Discharge: 2015-11-17 | Disposition: A | Discharge status: Home / Self Care | Payer: 59 | Source: Ambulatory Visit | Attending: Cardiology | Admitting: Cardiology

## 2015-11-17 VITALS — BP 102/66 | HR 87 | Wt 166.5 lb

## 2015-11-17 DIAGNOSIS — I5022 Chronic systolic (congestive) heart failure: Secondary | ICD-10-CM | POA: Insufficient documentation

## 2015-11-17 DIAGNOSIS — I272 Other secondary pulmonary hypertension: Secondary | ICD-10-CM

## 2015-11-17 DIAGNOSIS — I110 Hypertensive heart disease with heart failure: Secondary | ICD-10-CM | POA: Diagnosis not present

## 2015-11-17 DIAGNOSIS — E119 Type 2 diabetes mellitus without complications: Secondary | ICD-10-CM | POA: Diagnosis not present

## 2015-11-17 DIAGNOSIS — M797 Fibromyalgia: Secondary | ICD-10-CM | POA: Diagnosis not present

## 2015-11-17 DIAGNOSIS — E039 Hypothyroidism, unspecified: Secondary | ICD-10-CM | POA: Diagnosis not present

## 2015-11-17 LAB — BASIC METABOLIC PANEL
Anion gap: 16 — ABNORMAL HIGH (ref 5–15)
BUN: 27 mg/dL — ABNORMAL HIGH (ref 6–20)
CO2: 27 mmol/L (ref 22–32)
Calcium: 9.6 mg/dL (ref 8.9–10.3)
Chloride: 96 mmol/L — ABNORMAL LOW (ref 101–111)
Creatinine, Ser: 1.17 mg/dL — ABNORMAL HIGH (ref 0.44–1.00)
GFR calc Af Amer: >60 mL/min (ref >60)
GFR calc non Af Amer: 53 mL/min — ABNORMAL LOW (ref >60)
Glucose, Bld: 96 mg/dL (ref 65–99)
Potassium: 4.1 mmol/L (ref 3.5–5.1)
Sodium: 139 mmol/L (ref 135–145)

## 2015-11-17 LAB — BRAIN NATRIURETIC PEPTIDE: B Natriuretic Peptide: 114.7 pg/mL — ABNORMAL HIGH (ref 0.0–100.0)

## 2015-11-17 LAB — DIGOXIN LEVEL: Digoxin Level: 1.2 ng/mL (ref 0.8–2.0)

## 2015-11-17 MED ORDER — SACUBITRIL-VALSARTAN 24-26 MG PO TABS: 1.0000 | ORAL_TABLET | Freq: Two times a day (BID) | ORAL | Status: DC

## 2015-11-17 NOTE — Patient Instructions (Signed)
Stop Lisinopril  Start Entresto 24/26 mg Twice daily START ON Saturday 1/21 AM  Labs today  Labs in 2 weeks  You have been referred to Cardiac Rehab, they will contact you to schedule  Your physician recommends that you schedule a follow-up appointment in: 1 month

## 2015-11-18 LAB — PROTEIN ELECTROPHORESIS, SERUM
A/G Ratio: 1.2 (ref 0.7–1.7)
Albumin ELP: 4.1 g/dL (ref 2.9–4.4)
Alpha-1-Globulin: 0.2 g/dL (ref 0.0–0.4)
Alpha-2-Globulin: 0.9 g/dL (ref 0.4–1.0)
Beta Globulin: 1.1 g/dL (ref 0.7–1.3)
Gamma Globulin: 1.1 g/dL (ref 0.4–1.8)
Globulin, Total: 3.3 g/dL (ref 2.2–3.9)
Total Protein ELP: 7.4 g/dL (ref 6.0–8.5)

## 2015-11-18 NOTE — Progress Notes (Signed)
Patient ID: Robin Owen, female   DOB: 1961/10/02, 55 y.o.   MRN: [id number] PCP: Dr Altheimer Cardiology: Dr. Shirlee Latch  HPI: Robin Owen is a 55 year old Caucasian female with past medical history of HTN, HLD, DMII, hypothyroidism and fibromyalgia (on disability). No history of cancer.   Admitted 11/16 through 09/17/15 with increased dyspnea. CXR with pulmonary edema. This prompted and ECHO that showed reduced EF 20%. RHC/LHC showed normal coronaries and reduced cardiac index. Cardiac MRI showed some mid-wall late gadolinium enhancement in the septum.  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 She has been lisinopril for some time. Started on coreg and lasix.  Discharge weight was 163 pounds.   She seems to be doing pretty well overall.  She has some lightheadedness if she bends over and stands back up quickly.  No significant exertional dyspnea.  She is mainly limited by her fibromyalgia rather than dyspnea or fatigue.  No orthopnea/PND.  No chest pain. Weight is down 3 lbs.    Test/Procedures  09/15/2015 ECHO EF 20% MV mild regurgitation TV mild regurgitation  09/19/2015 CMRI 1. Severely dilated left ventricle with normal wall thickness and severe systolic dysfunction (LVEF 20-25%) with global hypokinesis and akinesis of the entire septal wall and diffuse mid wall late gadolinium enhancement. 2. Moderate RV dilatation with mild moderate systolic dysfunction. 3. Moderate left and right atrial dilatation. 4. Mild to moderate mitral and mild tricuspid regurgitation. 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  RHC/LHC 09/15/2017  RA 7 PCWP 29 CO 3.11 CI 1.7  Normal Cors  Labs 09/15/2015: BNP 2354 Labs 09/16/2015: TSH 1.99 Labs 09/17/2015: K 3.8 Creatinine 0.91 Labs 09/29/2015: K 4.5 Creatinine 1.09 Labs 10/05/2015: K 5.3 Creatinine 1.21, LDL 74, HCT 37.2  ROS: All systems negative except as listed in HPI, PMH and Problem  List.  SH:  Social History   Social History  . Marital Status: Married    Spouse Name: N/A  . Number of Children: N/A  . Years of Education: N/A   Occupational History  . Not on file.   Social History Main Topics  . Smoking status: Never Smoker   . Smokeless tobacco: Never Used  . Alcohol Use: No  . Drug Use: No  . Sexual Activity: Yes    Birth Control/ Protection: None   Other Topics Concern  . Not on file   Social History Narrative    FH:  Family History  Problem Relation Age of Onset  . Pulmonary fibrosis Mother   . Hypertension Mother   . Atrial fibrillation Mother   . Hypertension Father   . Diabetes Mellitus II Father   . Kidney disease Father   . Diabetes Mellitus II Brother   . Hypertension Brother   . Atrial fibrillation Maternal Grandmother   . Hypertension Maternal Grandmother   . Stroke Maternal Grandmother     Past Medical History  Diagnosis Date  . Diabetes mellitus without complication (HCC)   . Hypertension   . Fibromyalgia   . Hypothyroidism   . Hyperlipidemia     Current Outpatient Prescriptions  Medication Sig Dispense Refill  . atorvastatin (LIPITOR) 10 MG tablet Take 10 mg by mouth daily.    . Calcium Carbonate-Vitamin D (CALTRATE 600+D) 600-400 MG-UNIT tablet Take 1 tablet by mouth daily.    . carvedilol (COREG) 6.25 MG tablet Take 1 tablet (6.25 mg total) by mouth 2 (two) times daily with a meal. 60 tablet 6  . cyclobenzaprine (FLEXERIL) 10 MG tablet Take 10  mg by mouth 2 (two) times daily as needed for muscle spasms.    . digoxin (LANOXIN) 0.125 MG tablet Take 1 tablet (0.125 mg total) by mouth daily. 30 tablet 6  . FLUoxetine (PROZAC) 20 MG tablet Take 20 mg by mouth daily.    . furosemide (LASIX) 40 MG tablet TAKE 1 TABLET (40 MG TOTAL) BY MOUTH 2 (TWO) TIMES DAILY. 60 tablet 3  . gabapentin (NEURONTIN) 600 MG tablet Take 300-600 mg by mouth 3 (three) times daily as needed (for fibromyalgia). Take 1/2 tablet (300mg ) by mouth in  the morning, and afternonn, and 1 tablet (600mg ) at bedtime    . glimepiride (AMARYL) 2 MG tablet Take 2 mg by mouth daily as needed. Take if BG >160  1  . levothyroxine (SYNTHROID, LEVOTHROID) 88 MCG tablet Take 88 mcg by mouth daily before breakfast.    . Liraglutide (VICTOZA) 18 MG/3ML SOPN Inject 1.8 mg into the skin daily.    . metFORMIN (GLUCOPHAGE) 1000 MG tablet Take 1 tablet (1,000 mg total) by mouth 2 (two) times daily with a meal.    . Methylcellulose, Laxative, (MIRAFIBER) 500 MG TABS Take 500 mg by mouth daily.    . Multiple Vitamin (MULTIVITAMIN WITH MINERALS) TABS tablet Take 1 tablet by mouth daily.    Marland Kitchen NOVOTWIST 32G X 5 MM MISC Use as directed with Victozia.  2  . ONETOUCH VERIO test strip Check blood glucose once daily  2  . spironolactone (ALDACTONE) 25 MG tablet Take 0.5 tablets (12.5 mg total) by mouth daily. 90 tablet 3  . traMADol (ULTRAM) 50 MG tablet Take 50 mg by mouth every 6 (six) hours as needed for moderate pain.    . sacubitril-valsartan (ENTRESTO) 24-26 MG Take 1 tablet by mouth 2 (two) times daily. 60 tablet 6   No current facility-administered medications for this encounter.    Filed Vitals:   11/17/15 1355  BP: 102/66  Pulse: 87  Weight: 166 lb 8 oz (75.524 kg)  SpO2: 98%    PHYSICAL EXAM:  General:  Well appearing. No resp difficulty. Ambulated in the clinic without difficulty HEENT: normal Neck: supple. JVP flat. Carotids 2+ bilaterally; no bruits. No lymphadenopathy or thryomegaly appreciated. Cor: PMI normal. Regular rate & rhythm. No rubs, gallops or murmurs. Lungs: clear Abdomen: soft, nontender, nondistended. No hepatosplenomegaly. No bruits or masses. Good bowel sounds. Extremities: no cyanosis, clubbing, rash, edema Neuro: alert & orientedx3, cranial nerves grossly intact. Moves all 4 extremities w/o difficulty. Affect pleasant.  ASSESSMENT & PLAN: 1. Chronic Systolic Heart Failure: 09/15/2015 ECHO EF 20%. Had cMRI EF 20-25%, RV mod  dilated.  cMRI showed an area of mid-wall septal late gadolinium enhancement. NICM perhaps related to HTN versus viral myocarditis. 09/16/2015 RHC/LHC coronaries ok, low cardiac index 1.7.  NYHA class II symptoms. She does not look volume overloaded. - Continue lasix 40 mg twice a day. Continue 12.5 mg spironolactone daily.  BMET today.  - Continue Coreg 6.25 mg bid. - Continue digoxin, check level.   - Stop lisinopril, after 36 hours start Entresto 24/26 bid. BMET 2 wks.  - Plan to repeat echo in 5/16. Narrow QRS noted on EKG 09/16/15 so she would not be a candidate for CRT.  - Check SPEP/UPEP.  2. HTN: Stable.  3. Fibromyalgia: Disabled 1996  4. Hypothyroidism: On synthroid. TSH ok on most recent lab work. Per PCP 5. DM II: on amaryl and metformin. Per PCP  Followup in 1 month.   Marca Ancona 11/18/2015

## 2015-11-21 ENCOUNTER — Encounter (HOSPITAL_COMMUNITY): Payer: Self-pay | Admitting: *Deleted

## 2015-11-21 LAB — PROTEIN ELECTRO, RANDOM URINE
Albumin ELP, Urine: 47.7 %
Alpha-1-Globulin, U: 3.8 %
Alpha-2-Globulin, U: 8 %
Beta Globulin, U: 25.8 %
Gamma Globulin, U: 14.6 %
Total Protein, Urine: 4.4 mg/dL

## 2015-11-21 NOTE — Progress Notes (Signed)
Referral for Cardiac Rehab faxed 

## 2015-11-30 ENCOUNTER — Telehealth (HOSPITAL_COMMUNITY): Payer: Self-pay

## 2015-11-30 NOTE — Telephone Encounter (Signed)
Pt is checking with insurance for copayment and coverage information.

## 2015-12-02 ENCOUNTER — Ambulatory Visit (HOSPITAL_COMMUNITY)
Admission: RE | Admit: 2015-12-02 | Discharge: 2015-12-02 | Disposition: A | Discharge status: Home / Self Care | Payer: 59 | Source: Ambulatory Visit | Attending: Cardiology | Admitting: Cardiology

## 2015-12-02 DIAGNOSIS — I5022 Chronic systolic (congestive) heart failure: Secondary | ICD-10-CM | POA: Diagnosis not present

## 2015-12-02 LAB — BASIC METABOLIC PANEL
Anion gap: 14 (ref 5–15)
BUN: 22 mg/dL — ABNORMAL HIGH (ref 6–20)
CO2: 27 mmol/L (ref 22–32)
Calcium: 9.3 mg/dL (ref 8.9–10.3)
Chloride: 96 mmol/L — ABNORMAL LOW (ref 101–111)
Creatinine, Ser: 1.34 mg/dL — ABNORMAL HIGH (ref 0.44–1.00)
GFR calc Af Amer: 52 mL/min — ABNORMAL LOW (ref >60)
GFR calc non Af Amer: 45 mL/min — ABNORMAL LOW (ref >60)
Glucose, Bld: 144 mg/dL — ABNORMAL HIGH (ref 65–99)
Potassium: 3.9 mmol/L (ref 3.5–5.1)
Sodium: 137 mmol/L (ref 135–145)

## 2015-12-02 LAB — DIGOXIN LEVEL: Digoxin Level: 0.7 ng/mL — ABNORMAL LOW (ref 0.8–2.0)

## 2015-12-07 ENCOUNTER — Other Ambulatory Visit (HOSPITAL_COMMUNITY): Payer: Self-pay | Admitting: *Deleted

## 2015-12-07 MED ORDER — SPIRONOLACTONE 25 MG PO TABS: 12.5000 mg | ORAL_TABLET | Freq: Every day | ORAL | Status: DC

## 2015-12-07 MED ORDER — DIGOXIN 125 MCG PO TABS: 0.1250 mg | ORAL_TABLET | Freq: Every day | ORAL | Status: DC

## 2015-12-07 MED ORDER — FUROSEMIDE 40 MG PO TABS: ORAL_TABLET | ORAL | Status: DC

## 2015-12-07 MED ORDER — CARVEDILOL 6.25 MG PO TABS: 6.2500 mg | ORAL_TABLET | Freq: Two times a day (BID) | ORAL | Status: DC

## 2015-12-15 ENCOUNTER — Encounter (HOSPITAL_COMMUNITY)
Admission: RE | Admit: 2015-12-15 | Discharge: 2015-12-15 | Disposition: A | Discharge status: Home / Self Care | Payer: Managed Care, Other (non HMO) | Source: Ambulatory Visit | Attending: Cardiology | Admitting: Cardiology

## 2015-12-15 DIAGNOSIS — I509 Heart failure, unspecified: Secondary | ICD-10-CM | POA: Insufficient documentation

## 2015-12-15 NOTE — Progress Notes (Signed)
Cardiac Rehab Medication Review by a Pharmacist  Does the patient  feel that his/her medications are working for him/her?  yes  Has the patient been experiencing any side effects to the medications prescribed?  no  Does the patient measure his/her own blood pressure or blood glucose at home?  yes blood sugar  Does the patient have any problems obtaining medications due to transportation or finances?   no  Understanding of regimen: excellent Understanding of indications: excellent Potential of compliance: excellent    Pharmacist comments: Pt had no barriers or side effects with her medication regimen.  Pt did ask if any of her current medications could lead to increased Blood glucose. Her beta blocker seems to be the only medication that may cause hyperglycemia.  Pt states that has been instructed by her provider to take ASA 81 mg daily but she has not been taking due to taking ibuprofen 800 mg BID.     Philemon Kingdom Combs 12/15/2015 8:42 AM

## 2015-12-19 ENCOUNTER — Encounter (HOSPITAL_COMMUNITY): Payer: Self-pay

## 2015-12-19 ENCOUNTER — Encounter (HOSPITAL_COMMUNITY)
Admission: RE | Admit: 2015-12-19 | Discharge: 2015-12-19 | Disposition: A | Discharge status: Home / Self Care | Payer: Managed Care, Other (non HMO) | Source: Ambulatory Visit | Attending: Cardiology | Admitting: Cardiology

## 2015-12-19 DIAGNOSIS — I509 Heart failure, unspecified: Secondary | ICD-10-CM | POA: Diagnosis present

## 2015-12-19 LAB — GLUCOSE, CAPILLARY
Glucose-Capillary: 151 mg/dL — ABNORMAL HIGH (ref 65–99)
Glucose-Capillary: 130 mg/dL — ABNORMAL HIGH (ref 65–99)

## 2015-12-19 NOTE — Progress Notes (Signed)
Pt started cardiac rehab today.  Pt tolerated light exercise without difficulty. VSS, telemetry-sinus rhythm, asymptomatic.  Medication list reconciled.  Pt verbalized compliance with medications and denies barriers to compliance. PSYCHOSOCIAL ASSESSMENT:  PHQ-5.  Pt does have longstanding history of depression effectively treated with prozac.  Pt also has fibromyalgia with chronic pain.  Pt does report the feelings of depression are often associated with her chronic illness and inability to perform activities as prior to her cardiac event . Pt exhibits positive coping skills, hopeful outlook with supportive family.  Pt is married with 2 grown children.  Pt enjoys crafts and crocheting when she is not limited by pain.  Pt offered emotional support and reassurance.     Pt cardiac rehab  goal is  to increase energy, strength and stamina.  Pt encouraged to participate in home exercise in addition to cardiac rehab activities to increase ability to achieve these goals.  pt oriented to exercise equipment and routine.  Understanding verbalized.

## 2015-12-20 ENCOUNTER — Telehealth: Payer: Self-pay | Admitting: *Deleted

## 2015-12-20 NOTE — Telephone Encounter (Signed)
I followed up with patient for 43-month phone call. Patient has not been re-hospitalized for congestive heart failure. I thanked patient for her participation in the study.

## 2015-12-21 ENCOUNTER — Ambulatory Visit (HOSPITAL_COMMUNITY)
Admission: RE | Admit: 2015-12-21 | Discharge: 2015-12-21 | Disposition: A | Discharge status: Home / Self Care | Payer: Managed Care, Other (non HMO) | Source: Ambulatory Visit | Attending: Cardiology | Admitting: Cardiology

## 2015-12-21 ENCOUNTER — Encounter (HOSPITAL_COMMUNITY): Payer: Managed Care, Other (non HMO)

## 2015-12-21 ENCOUNTER — Encounter (HOSPITAL_COMMUNITY): Payer: Self-pay

## 2015-12-21 VITALS — BP 106/63 | HR 90 | Resp 18 | Wt 165.8 lb

## 2015-12-21 DIAGNOSIS — E785 Hyperlipidemia, unspecified: Secondary | ICD-10-CM | POA: Insufficient documentation

## 2015-12-21 DIAGNOSIS — E039 Hypothyroidism, unspecified: Secondary | ICD-10-CM | POA: Diagnosis not present

## 2015-12-21 DIAGNOSIS — Z7984 Long term (current) use of oral hypoglycemic drugs: Secondary | ICD-10-CM | POA: Insufficient documentation

## 2015-12-21 DIAGNOSIS — I5022 Chronic systolic (congestive) heart failure: Secondary | ICD-10-CM | POA: Insufficient documentation

## 2015-12-21 DIAGNOSIS — I110 Hypertensive heart disease with heart failure: Secondary | ICD-10-CM | POA: Diagnosis not present

## 2015-12-21 DIAGNOSIS — Z79899 Other long term (current) drug therapy: Secondary | ICD-10-CM | POA: Insufficient documentation

## 2015-12-21 DIAGNOSIS — Z8249 Family history of ischemic heart disease and other diseases of the circulatory system: Secondary | ICD-10-CM | POA: Diagnosis not present

## 2015-12-21 DIAGNOSIS — E119 Type 2 diabetes mellitus without complications: Secondary | ICD-10-CM | POA: Diagnosis not present

## 2015-12-21 DIAGNOSIS — Z833 Family history of diabetes mellitus: Secondary | ICD-10-CM | POA: Insufficient documentation

## 2015-12-21 DIAGNOSIS — M797 Fibromyalgia: Secondary | ICD-10-CM | POA: Insufficient documentation

## 2015-12-21 DIAGNOSIS — I10 Essential (primary) hypertension: Secondary | ICD-10-CM

## 2015-12-21 MED ORDER — SPIRONOLACTONE 25 MG PO TABS: 25.0000 mg | ORAL_TABLET | Freq: Every day | ORAL | Status: DC

## 2015-12-21 MED ORDER — CARVEDILOL 6.25 MG PO TABS: 9.3750 mg | ORAL_TABLET | Freq: Two times a day (BID) | ORAL | Status: DC

## 2015-12-21 NOTE — Patient Instructions (Signed)
INCREASE Carvedilol (Coreg) to 9.375 mg (1.5 tabs) twice daily.  INCREASE Spironolactone to 25 mg (1 whole tab) once daily.  Return in 2 weeks for lab work  Follow up 1 month.  Do the following things EVERYDAY: 1) Weigh yourself in the morning before breakfast. Write it down and keep it in a log. 2) Take your medicines as prescribed 3) Eat low salt foods-Limit salt (sodium) to 2000 mg per day.  4) Stay as active as you can everyday 5) Limit all fluids for the day to less than 2 liters

## 2015-12-22 NOTE — Progress Notes (Signed)
Patient ID: Robin Owen, female   DOB: 10/28/1961, 55 y.o.   MRN: [id number] PCP: Dr Altheimer Cardiology: Dr. Shirlee Latch  HPI: [name] is a 55 year old Caucasian female with past medical history of HTN, HLD, DMII, hypothyroidism and fibromyalgia (on disability). No history of cancer.   Admitted 11/16 through 09/17/15 with increased dyspnea. CXR with pulmonary edema. This prompted and ECHO that showed reduced EF 20%. RHC/LHC showed normal coronaries and reduced cardiac index. Cardiac MRI showed some mid-wall late gadolinium enhancement in the septum.  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 She has been lisinopril for some time. Started on coreg and lasix.  Discharge weight was 163 pounds.   She seems to be doing pretty well overall.  She is now doing cardiac rehab. No lightheadedness. No significant exertional dyspnea.  She is mainly limited by her fibromyalgia rather than dyspnea or fatigue.  No orthopnea/PND.  No chest pain. Weight is down 1 lb.    Test/Procedures  09/15/2015 ECHO EF 20% MV mild regurgitation TV mild regurgitation  09/19/2015 CMRI 1. Severely dilated left ventricle with normal wall thickness and severe systolic dysfunction (LVEF 20-25%) with global hypokinesis and akinesis of the entire septal wall and diffuse mid wall late gadolinium enhancement. 2. Moderate RV dilatation with mild moderate systolic dysfunction. 3. Moderate left and right atrial dilatation. 4. Mild to moderate mitral and mild tricuspid regurgitation. 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  RHC/LHC 09/15/2017  RA 7 PCWP 29 CO 3.11 CI 1.7  Normal Cors  Labs 09/15/2015: BNP 2354 Labs 09/16/2015: TSH 1.99 Labs 09/17/2015: K 3.8 Creatinine 0.91 Labs 09/29/2015: K 4.5 Creatinine 1.09 Labs 10/05/2015: K 5.3 Creatinine 1.21, LDL 74, HCT 37.2 Labs 1/17: SPEP negative Labs 2/17: K 3.9, creatinine 1.34, digoxin 0.7  ROS: All systems negative  except as listed in HPI, PMH and Problem List.  SH:  Social History   Social History  . Marital Status: Married    Spouse Name: N/A  . Number of Children: N/A  . Years of Education: N/A   Occupational History  . Not on file.   Social History Main Topics  . Smoking status: Never Smoker   . Smokeless tobacco: Never Used  . Alcohol Use: No  . Drug Use: No  . Sexual Activity: Yes    Birth Control/ Protection: None   Other Topics Concern  . Not on file   Social History Narrative    FH:  Family History  Problem Relation Age of Onset  . Pulmonary fibrosis Mother   . Hypertension Mother   . Atrial fibrillation Mother   . Hypertension Father   . Diabetes Mellitus II Father   . Kidney disease Father   . Diabetes Mellitus II Brother   . Hypertension Brother   . Atrial fibrillation Maternal Grandmother   . Hypertension Maternal Grandmother   . Stroke Maternal Grandmother     Past Medical History  Diagnosis Date  . Diabetes mellitus without complication (HCC)   . Hypertension   . Fibromyalgia   . Hypothyroidism   . Hyperlipidemia     Current Outpatient Prescriptions  Medication Sig Dispense Refill  . atorvastatin (LIPITOR) 10 MG tablet Take 10 mg by mouth daily.    . Calcium Carbonate-Vitamin D (CALTRATE 600+D) 600-400 MG-UNIT tablet Take 1 tablet by mouth daily.    . carvedilol (COREG) 6.25 MG tablet Take 1.5 tablets (9.375 mg total) by mouth 2 (two) times daily with a meal. 270 tablet 3  . cyclobenzaprine (  FLEXERIL) 10 MG tablet Take 10 mg by mouth 2 (two) times daily as needed for muscle spasms (pt takes qhs and PRN).     Marland Kitchen digoxin (LANOXIN) 0.125 MG tablet Take 1 tablet (0.125 mg total) by mouth daily. 90 tablet 3  . FLUoxetine (PROZAC) 20 MG tablet Take 20 mg by mouth daily.    . furosemide (LASIX) 40 MG tablet TAKE 1 TABLET (40 MG TOTAL) BY MOUTH 2 (TWO) TIMES DAILY. 60 tablet 3  . gabapentin (NEURONTIN) 600 MG tablet Take 300-600 mg by mouth 3 (three) times  daily as needed (for fibromyalgia). Take 1/2 tablet (300mg ) by mouth in the morning, and afternonn, and 1 tablet (600mg ) at bedtime    . glimepiride (AMARYL) 2 MG tablet Take 2 mg by mouth daily as needed (on average pt takes 3x weekly). Take if BG >160  1  . ibuprofen (ADVIL,MOTRIN) 200 MG tablet Take 800 mg by mouth 2 (two) times daily.    Marland Kitchen levothyroxine (SYNTHROID, LEVOTHROID) 88 MCG tablet Take 88 mcg by mouth daily before breakfast.    . Liraglutide (VICTOZA) 18 MG/3ML SOPN Inject 1.8 mg into the skin daily.    . metFORMIN (GLUCOPHAGE) 1000 MG tablet Take 1 tablet (1,000 mg total) by mouth 2 (two) times daily with a meal.    . Methylcellulose, Laxative, (MIRAFIBER) 500 MG TABS Take 500 mg by mouth daily.    . Multiple Vitamin (MULTIVITAMIN WITH MINERALS) TABS tablet Take 1 tablet by mouth daily.    Marland Kitchen NOVOTWIST 32G X 5 MM MISC Use as directed with Victozia.  2  . ONETOUCH VERIO test strip Check blood glucose once daily  2  . sacubitril-valsartan (ENTRESTO) 24-26 MG Take 1 tablet by mouth 2 (two) times daily. 60 tablet 6  . spironolactone (ALDACTONE) 25 MG tablet Take 1 tablet (25 mg total) by mouth daily. 90 tablet 3  . traMADol (ULTRAM) 50 MG tablet Take 50 mg by mouth every 6 (six) hours as needed for moderate pain (pt takes qhs and PRN).      No current facility-administered medications for this encounter.    Filed Vitals:   12/21/15 1336  BP: 106/63  Pulse: 90  Resp: 18  Weight: 165 lb 12 oz (75.184 kg)  SpO2: 97%    PHYSICAL EXAM:  General:  NAD.  HEENT: normal Neck: supple. JVP flat. Carotids 2+ bilaterally; no bruits. No lymphadenopathy or thryomegaly appreciated. Cor: PMI normal. Regular rate & rhythm. No rubs, gallops or murmurs. Lungs: clear Abdomen: soft, nontender, nondistended. No hepatosplenomegaly. No bruits or masses. Good bowel sounds. Extremities: no cyanosis, clubbing, rash, edema Neuro: alert & orientedx3, cranial nerves grossly intact. Moves all 4  extremities w/o difficulty. Affect pleasant.  ASSESSMENT & PLAN: 1. Chronic systolic CHF: 09/15/2015 ECHO EF 20%. Had cMRI EF 20-25%, RV mod dilated.  cMRI showed an area of mid-wall septal late gadolinium enhancement. NICM perhaps related to HTN versus viral myocarditis. SPEP negative.  09/16/2015 RHC/LHC coronaries ok, low cardiac index 1.7.  NYHA class II symptoms now. She does not look volume overloaded. - Continue lasix 40 mg twice a day.  - Increase spironolactone to 25 mg daily with BMET in 2 wks.  - Increase Coreg to 9.375 mg bid.  - Continue digoxin, recent level ok.   - Continue Entresto.  - Plan to repeat echo in 5/16. Narrow QRS noted on EKG 09/16/15 so she would not be a candidate for CRT.  2. HTN: Stable.  3. Fibromyalgia: Disabled since  1996   Followup in 1 month for medication titration.   Marca Ancona 12/22/2015

## 2015-12-23 ENCOUNTER — Encounter (HOSPITAL_COMMUNITY)
Admission: RE | Admit: 2015-12-23 | Discharge: 2015-12-23 | Disposition: A | Discharge status: Home / Self Care | Payer: Managed Care, Other (non HMO) | Source: Ambulatory Visit | Attending: Cardiology | Admitting: Cardiology

## 2015-12-23 DIAGNOSIS — I509 Heart failure, unspecified: Secondary | ICD-10-CM | POA: Diagnosis not present

## 2015-12-23 LAB — GLUCOSE, CAPILLARY
Glucose-Capillary: 104 mg/dL — ABNORMAL HIGH (ref 65–99)
Glucose-Capillary: 107 mg/dL — ABNORMAL HIGH (ref 65–99)

## 2015-12-26 ENCOUNTER — Encounter (HOSPITAL_COMMUNITY)
Admission: RE | Admit: 2015-12-26 | Discharge: 2015-12-26 | Disposition: A | Discharge status: Home / Self Care | Payer: Managed Care, Other (non HMO) | Source: Ambulatory Visit | Attending: Cardiology | Admitting: Cardiology

## 2015-12-26 DIAGNOSIS — I509 Heart failure, unspecified: Secondary | ICD-10-CM | POA: Diagnosis not present

## 2015-12-26 LAB — GLUCOSE, CAPILLARY
Glucose-Capillary: 181 mg/dL — ABNORMAL HIGH (ref 65–99)
Glucose-Capillary: 106 mg/dL — ABNORMAL HIGH (ref 65–99)

## 2015-12-28 ENCOUNTER — Encounter (HOSPITAL_COMMUNITY)
Admission: RE | Admit: 2015-12-28 | Discharge: 2015-12-28 | Disposition: A | Discharge status: Home / Self Care | Payer: Managed Care, Other (non HMO) | Source: Ambulatory Visit | Attending: Cardiology | Admitting: Cardiology

## 2015-12-28 DIAGNOSIS — I509 Heart failure, unspecified: Secondary | ICD-10-CM | POA: Diagnosis not present

## 2015-12-28 LAB — GLUCOSE, CAPILLARY: Glucose-Capillary: 154 mg/dL — ABNORMAL HIGH (ref 65–99)

## 2015-12-30 ENCOUNTER — Encounter (HOSPITAL_COMMUNITY)
Admission: RE | Admit: 2015-12-30 | Discharge: 2015-12-30 | Disposition: A | Discharge status: Home / Self Care | Payer: Managed Care, Other (non HMO) | Source: Ambulatory Visit | Attending: Cardiology | Admitting: Cardiology

## 2015-12-30 DIAGNOSIS — I509 Heart failure, unspecified: Secondary | ICD-10-CM | POA: Diagnosis not present

## 2015-12-30 LAB — GLUCOSE, CAPILLARY: Glucose-Capillary: 133 mg/dL — ABNORMAL HIGH (ref 65–99)

## 2015-12-30 NOTE — Progress Notes (Signed)
QUALITY OF LIFE SCORE REVIEW  Pt completed Quality of Life survey as a participant in Cardiac Rehab.Scores below 21 are considered low.  Patient quality of life slightly altered by physical constraints which limits ability to perform as prior to recent cardiac illness and having fibromyalgia. Pt is on disability from her fibromyalgia. Pt is discouraged to have another chronic condition in addition to her fibromyalgia. Pt is also concerned about having coronary disease at her young age. Pt reports all of her concerns are related to her chronic illnesses in addition to her daughter having type 1 diabetes.  Pt is often worried and concerned about her daughter's welfare. Pt is currently being treated by her PCP.   Offered emotional support and reassurance.  Will continue to monitor and intervene as necessary.

## 2016-01-02 ENCOUNTER — Encounter (HOSPITAL_COMMUNITY)
Admission: RE | Admit: 2016-01-02 | Discharge: 2016-01-02 | Disposition: A | Discharge status: Home / Self Care | Payer: Managed Care, Other (non HMO) | Source: Ambulatory Visit | Attending: Cardiology | Admitting: Cardiology

## 2016-01-02 DIAGNOSIS — I509 Heart failure, unspecified: Secondary | ICD-10-CM | POA: Diagnosis not present

## 2016-01-02 LAB — GLUCOSE, CAPILLARY: Glucose-Capillary: 187 mg/dL — ABNORMAL HIGH (ref 65–99)

## 2016-01-04 ENCOUNTER — Encounter (HOSPITAL_COMMUNITY)
Admission: RE | Admit: 2016-01-04 | Discharge: 2016-01-04 | Disposition: A | Discharge status: Home / Self Care | Payer: Managed Care, Other (non HMO) | Source: Ambulatory Visit | Attending: Cardiology | Admitting: Cardiology

## 2016-01-04 DIAGNOSIS — I509 Heart failure, unspecified: Secondary | ICD-10-CM | POA: Diagnosis not present

## 2016-01-04 LAB — GLUCOSE, CAPILLARY
Glucose-Capillary: 223 mg/dL — ABNORMAL HIGH (ref 65–99)
Glucose-Capillary: 159 mg/dL — ABNORMAL HIGH (ref 65–99)

## 2016-01-06 ENCOUNTER — Encounter (HOSPITAL_COMMUNITY)
Admission: RE | Admit: 2016-01-06 | Discharge: 2016-01-06 | Disposition: A | Discharge status: Home / Self Care | Payer: Managed Care, Other (non HMO) | Source: Ambulatory Visit | Attending: Cardiology | Admitting: Cardiology

## 2016-01-06 ENCOUNTER — Ambulatory Visit (HOSPITAL_COMMUNITY)
Admission: RE | Admit: 2016-01-06 | Discharge: 2016-01-06 | Disposition: A | Discharge status: Home / Self Care | Payer: Managed Care, Other (non HMO) | Source: Ambulatory Visit | Attending: Internal Medicine | Admitting: Internal Medicine

## 2016-01-06 DIAGNOSIS — I509 Heart failure, unspecified: Secondary | ICD-10-CM | POA: Diagnosis not present

## 2016-01-06 DIAGNOSIS — I5022 Chronic systolic (congestive) heart failure: Secondary | ICD-10-CM | POA: Diagnosis present

## 2016-01-06 LAB — BASIC METABOLIC PANEL
Anion gap: 13 (ref 5–15)
BUN: 19 mg/dL (ref 6–20)
CO2: 27 mmol/L (ref 22–32)
Calcium: 9.1 mg/dL (ref 8.9–10.3)
Chloride: 95 mmol/L — ABNORMAL LOW (ref 101–111)
Creatinine, Ser: 1.25 mg/dL — ABNORMAL HIGH (ref 0.44–1.00)
GFR calc Af Amer: 56 mL/min — ABNORMAL LOW (ref >60)
GFR calc non Af Amer: 49 mL/min — ABNORMAL LOW (ref >60)
Glucose, Bld: 273 mg/dL — ABNORMAL HIGH (ref 65–99)
Potassium: 3.9 mmol/L (ref 3.5–5.1)
Sodium: 135 mmol/L (ref 135–145)

## 2016-01-06 LAB — GLUCOSE, CAPILLARY
Glucose-Capillary: 193 mg/dL — ABNORMAL HIGH (ref 65–99)
Glucose-Capillary: 183 mg/dL — ABNORMAL HIGH (ref 65–99)

## 2016-01-09 ENCOUNTER — Encounter (HOSPITAL_COMMUNITY)
Admission: RE | Admit: 2016-01-09 | Discharge: 2016-01-09 | Disposition: A | Discharge status: Home / Self Care | Payer: Managed Care, Other (non HMO) | Source: Ambulatory Visit | Attending: Cardiology | Admitting: Cardiology

## 2016-01-09 DIAGNOSIS — I509 Heart failure, unspecified: Secondary | ICD-10-CM | POA: Diagnosis not present

## 2016-01-09 LAB — GLUCOSE, CAPILLARY: Glucose-Capillary: 206 mg/dL — ABNORMAL HIGH (ref 65–99)

## 2016-01-09 NOTE — Progress Notes (Signed)
Reviewed home exercise guidelines with patient including endpoints, temperature precautions, target heart rate and rate of perceived exertion. Pt plans to walk as her mode of home exercise. Pt plans to start walking 1 day outside of cardiac rehab, and once she's consistent she will add a 2nd day out side of cardiac rehab with a goal of exercising 5 to 7 days per week. Pt voices understanding of instructions given. Artist Pais, MS, ACSM CCEP

## 2016-01-11 ENCOUNTER — Encounter (HOSPITAL_COMMUNITY)
Admission: RE | Admit: 2016-01-11 | Discharge: 2016-01-11 | Disposition: A | Discharge status: Home / Self Care | Payer: Managed Care, Other (non HMO) | Source: Ambulatory Visit | Attending: Cardiology | Admitting: Cardiology

## 2016-01-11 DIAGNOSIS — I509 Heart failure, unspecified: Secondary | ICD-10-CM | POA: Diagnosis not present

## 2016-01-11 LAB — GLUCOSE, CAPILLARY: Glucose-Capillary: 129 mg/dL — ABNORMAL HIGH (ref 65–99)

## 2016-01-13 ENCOUNTER — Encounter (HOSPITAL_COMMUNITY)
Admission: RE | Admit: 2016-01-13 | Discharge: 2016-01-13 | Disposition: A | Discharge status: Home / Self Care | Payer: Managed Care, Other (non HMO) | Source: Ambulatory Visit | Attending: Cardiology | Admitting: Cardiology

## 2016-01-13 DIAGNOSIS — I509 Heart failure, unspecified: Secondary | ICD-10-CM | POA: Diagnosis not present

## 2016-01-13 LAB — GLUCOSE, CAPILLARY: Glucose-Capillary: 96 mg/dL (ref 65–99)

## 2016-01-15 ENCOUNTER — Telehealth: Payer: Self-pay | Admitting: Physician Assistant

## 2016-01-15 NOTE — Telephone Encounter (Signed)
Patient called in with complaints of Fever 101, Malaise and Aches since yesterday. Other family members with the flu. I advised her to call her PCP as he may want her to start Tamiflu today. She agrees with this plan. Tereso Newcomer, PA-C   01/15/2016 2:25 PM

## 2016-01-16 ENCOUNTER — Encounter (HOSPITAL_COMMUNITY): Payer: Managed Care, Other (non HMO)

## 2016-01-16 ENCOUNTER — Telehealth (HOSPITAL_COMMUNITY): Payer: Self-pay | Admitting: Endocrinology

## 2016-01-18 ENCOUNTER — Encounter (HOSPITAL_COMMUNITY): Payer: Managed Care, Other (non HMO)

## 2016-01-19 ENCOUNTER — Telehealth (HOSPITAL_COMMUNITY): Payer: Self-pay | Admitting: *Deleted

## 2016-01-19 NOTE — Telephone Encounter (Signed)
Pt aware and agreeable, she will let us know Mon if not feeling better

## 2016-01-19 NOTE — Telephone Encounter (Signed)
Take Lasix 60 mg bid x 3 days then back to 40 mg daily.  If not improved, call back and can be seen in office.

## 2016-01-19 NOTE — Telephone Encounter (Signed)
Pt called to report she having increase SOB.  She states over the weekend she had the flu and pcp gave her tamiflu which helped and her flu symptoms and fever have resolved but now she is SOB.  She states breathing is fine at rest but if she gets up and moves around she gets SOB and has to stop to catch her breathe.  She states her wt is actually down a few lbs but her stomach is swollen.  She states she has been taking all meds including Lasix 40 mg BID.  Will discuss w/Dr Shirlee Latch and call her back.

## 2016-01-20 ENCOUNTER — Encounter (HOSPITAL_COMMUNITY): Payer: Managed Care, Other (non HMO)

## 2016-01-23 ENCOUNTER — Encounter (HOSPITAL_COMMUNITY)
Admission: RE | Admit: 2016-01-23 | Discharge: 2016-01-23 | Disposition: A | Discharge status: Home / Self Care | Payer: Managed Care, Other (non HMO) | Source: Ambulatory Visit | Attending: Cardiology | Admitting: Cardiology

## 2016-01-23 DIAGNOSIS — I509 Heart failure, unspecified: Secondary | ICD-10-CM | POA: Diagnosis not present

## 2016-01-23 NOTE — Progress Notes (Signed)
Robin Owen 55 y.o. female Nutrition Note Spoke with pt. Nutrition Plan and Nutrition Survey goals reviewed with pt. Pt is following Step 2 of the Therapeutic Lifestyle Changes diet. Pt wants to lose wt. Pt taking Victoza, which may help with pt wt loss effort. Pt is diabetic. This Probation officer went over Diabetes Education test results. Pt checks fasting CBG's daily. Fasting CBG's reportedly 150-218 mg/dL, "which is high for me." Pt has an appointment with her Endocrinologist to discuss elevated CBG's. Pt c/o elevated CBG's "since I started on all my new heart medications." Per pt, her A1c is typically 6.5 or less. Pt with dx of CHF. Per discussion, pt does not use canned/convenience foods often. Pt rarely adds salt to food.  Pt expressed understanding of the information reviewed. Pt aware of nutrition education classes offered and plans on attending nutrition classes.  No results found for: HGBA1C Wt Readings from Last 3 Encounters:  12/21/15 165 lb 12 oz (75.184 kg)  12/15/15 167 lb 15.9 oz (76.2 kg)  11/17/15 166 lb 8 oz (75.524 kg)    Nutrition Diagnosis ? Food-and nutrition-related knowledge deficit related to lack of exposure to information as related to diagnosis of: ? CVD ? DM ? Overweight related to excessive energy intake as evidenced by a BMI of 27.7  Nutrition RX/ Estimated Daily Nutrition Needs for: wt loss 1300-1600 Kcal, 35-45 gm fat, 10-12 gm sat fat, 1.3-1.6 gm trans-fat, <1500 mg sodium, 175 gm CHO   Nutrition Intervention ? Pt's individual nutrition plan reviewed with pt. ? Benefits of adopting Therapeutic Lifestyle Changes discussed when Medficts reviewed. ? Pt to attend the Portion Distortion class - met; 01/06/16 ? Pt to attend the Diabetes Q & A class ? Pt to attend the   ? Nutrition I class                     ? Nutrition II class     ? Diabetes Blitz class ? Continue client-centered nutrition education by RD, as part of interdisciplinary care. Goal(s) ? Pt to  identify food quantities necessary to achieve weight loss of 6-24 lb (2.7-10.9 kg) at graduation from cardiac rehab.  ? CBG concentrations in the normal range or as close to normal as is safely possible. Monitor and Evaluate progress toward nutrition goal with team. Derek Mound, M.Ed, RD, LDN, CDE 01/23/2016 2:30 PM

## 2016-01-25 ENCOUNTER — Encounter (HOSPITAL_COMMUNITY)
Admission: RE | Admit: 2016-01-25 | Discharge: 2016-01-25 | Disposition: A | Discharge status: Home / Self Care | Payer: Managed Care, Other (non HMO) | Source: Ambulatory Visit | Attending: Cardiology | Admitting: Cardiology

## 2016-01-25 DIAGNOSIS — I509 Heart failure, unspecified: Secondary | ICD-10-CM | POA: Diagnosis not present

## 2016-01-25 NOTE — Progress Notes (Signed)
PSYCHOSOCIAL REVIEW  No psychosocial needs identfied, no interventions necessary.  Pt is exercising on her own.  Pt is exercising on her own at home.  Pt suffers from chronic pain which causes limitations to physical activity.  Pt c/o fatigue and dyspnea on exertion more than usual today. O2sat- 97% RA, lungs clear, no peripheral edema.  Pt is recovering from flu 5 days ago. Pt encouraged to decrease workloads today and increase PO fluid intake.  Stop activity if symptoms worsen.  Pt verbalized understanding. Will continue to monitor.

## 2016-01-27 ENCOUNTER — Encounter (HOSPITAL_COMMUNITY)
Admission: RE | Admit: 2016-01-27 | Discharge: 2016-01-27 | Disposition: A | Discharge status: Home / Self Care | Payer: Managed Care, Other (non HMO) | Source: Ambulatory Visit | Attending: Cardiology | Admitting: Cardiology

## 2016-01-27 DIAGNOSIS — I509 Heart failure, unspecified: Secondary | ICD-10-CM | POA: Diagnosis not present

## 2016-01-27 NOTE — Progress Notes (Signed)
Pt c/o fatigue and dyspnea at cardiac rehab, pt notices with activities involving arms and notes she had similar symptoms at home yesterday while doing housework.  Pt VSS, post exercise BP:  94/60.  Pt given gatorade, symptoms resolved.  Discussed with pt need to increase PO food and fluid intake.  Will review with Dr. Shirlee Latch. Pt instructed to notify MD if symptoms persist or worsen.  Understanding verbalized.

## 2016-01-30 ENCOUNTER — Encounter (HOSPITAL_COMMUNITY)
Admission: RE | Admit: 2016-01-30 | Discharge: 2016-01-30 | Disposition: A | Discharge status: Home / Self Care | Payer: Managed Care, Other (non HMO) | Source: Ambulatory Visit | Attending: Cardiology | Admitting: Cardiology

## 2016-01-30 DIAGNOSIS — I509 Heart failure, unspecified: Secondary | ICD-10-CM | POA: Diagnosis not present

## 2016-01-31 ENCOUNTER — Telehealth (HOSPITAL_COMMUNITY): Payer: Self-pay | Admitting: Vascular Surgery

## 2016-01-31 ENCOUNTER — Telehealth (HOSPITAL_COMMUNITY): Payer: Self-pay | Admitting: *Deleted

## 2016-01-31 ENCOUNTER — Ambulatory Visit (HOSPITAL_COMMUNITY)
Admission: RE | Admit: 2016-01-31 | Discharge: 2016-01-31 | Disposition: A | Discharge status: Home / Self Care | Payer: Managed Care, Other (non HMO) | Source: Ambulatory Visit | Attending: Internal Medicine | Admitting: Internal Medicine

## 2016-01-31 DIAGNOSIS — I5022 Chronic systolic (congestive) heart failure: Secondary | ICD-10-CM | POA: Diagnosis present

## 2016-01-31 LAB — CBC
HCT: 30.3 % — ABNORMAL LOW (ref 36.0–46.0)
Hemoglobin: 10 g/dL — ABNORMAL LOW (ref 12.0–15.0)
MCH: 30.8 pg (ref 26.0–34.0)
MCHC: 33 g/dL (ref 30.0–36.0)
MCV: 93.2 fL (ref 78.0–100.0)
Platelets: 319 10*3/uL (ref 150–400)
RBC: 3.25 MIL/uL — ABNORMAL LOW (ref 3.87–5.11)
RDW: 15 % (ref 11.5–15.5)
WBC: 11.4 10*3/uL — ABNORMAL HIGH (ref 4.0–10.5)

## 2016-01-31 LAB — BASIC METABOLIC PANEL
Anion gap: 14 (ref 5–15)
BUN: 33 mg/dL — ABNORMAL HIGH (ref 6–20)
CO2: 26 mmol/L (ref 22–32)
Calcium: 10.2 mg/dL (ref 8.9–10.3)
Chloride: 97 mmol/L — ABNORMAL LOW (ref 101–111)
Creatinine, Ser: 1.82 mg/dL — ABNORMAL HIGH (ref 0.44–1.00)
GFR calc Af Amer: 36 mL/min — ABNORMAL LOW (ref >60)
GFR calc non Af Amer: 31 mL/min — ABNORMAL LOW (ref >60)
Glucose, Bld: 184 mg/dL — ABNORMAL HIGH (ref 65–99)
Potassium: 5.9 mmol/L — ABNORMAL HIGH (ref 3.5–5.1)
Sodium: 137 mmol/L (ref 135–145)

## 2016-01-31 NOTE — Telephone Encounter (Signed)
Spoke with Robin Owen and told her i would follow up with Dr.McLean and give her a call back

## 2016-01-31 NOTE — Telephone Encounter (Signed)
Maria from cardiac rehab called pt is down in class, pt not feeling well, pale in color, weak, Byrd Hesselbach believes pt needs to be seen today if possible.. Please advise

## 2016-02-01 ENCOUNTER — Encounter (HOSPITAL_COMMUNITY)
Admission: RE | Admit: 2016-02-01 | Discharge: 2016-02-01 | Disposition: A | Discharge status: Home / Self Care | Payer: Managed Care, Other (non HMO) | Source: Ambulatory Visit | Attending: Cardiology | Admitting: Cardiology

## 2016-02-01 ENCOUNTER — Ambulatory Visit (HOSPITAL_COMMUNITY)
Admission: RE | Admit: 2016-02-01 | Discharge: 2016-02-01 | Disposition: A | Discharge status: Home / Self Care | Payer: Managed Care, Other (non HMO) | Source: Ambulatory Visit | Attending: Cardiology | Admitting: Cardiology

## 2016-02-01 VITALS — BP 116/76 | HR 98 | Wt 163.8 lb

## 2016-02-01 DIAGNOSIS — E039 Hypothyroidism, unspecified: Secondary | ICD-10-CM | POA: Diagnosis not present

## 2016-02-01 DIAGNOSIS — I110 Hypertensive heart disease with heart failure: Secondary | ICD-10-CM | POA: Diagnosis not present

## 2016-02-01 DIAGNOSIS — Z79899 Other long term (current) drug therapy: Secondary | ICD-10-CM | POA: Insufficient documentation

## 2016-02-01 DIAGNOSIS — I5022 Chronic systolic (congestive) heart failure: Secondary | ICD-10-CM | POA: Diagnosis not present

## 2016-02-01 DIAGNOSIS — E785 Hyperlipidemia, unspecified: Secondary | ICD-10-CM | POA: Diagnosis not present

## 2016-02-01 DIAGNOSIS — M797 Fibromyalgia: Secondary | ICD-10-CM | POA: Insufficient documentation

## 2016-02-01 DIAGNOSIS — Z8249 Family history of ischemic heart disease and other diseases of the circulatory system: Secondary | ICD-10-CM | POA: Insufficient documentation

## 2016-02-01 DIAGNOSIS — D649 Anemia, unspecified: Secondary | ICD-10-CM | POA: Insufficient documentation

## 2016-02-01 DIAGNOSIS — Z833 Family history of diabetes mellitus: Secondary | ICD-10-CM | POA: Diagnosis not present

## 2016-02-01 DIAGNOSIS — Z7984 Long term (current) use of oral hypoglycemic drugs: Secondary | ICD-10-CM | POA: Insufficient documentation

## 2016-02-01 DIAGNOSIS — I509 Heart failure, unspecified: Secondary | ICD-10-CM | POA: Diagnosis not present

## 2016-02-01 DIAGNOSIS — N179 Acute kidney failure, unspecified: Secondary | ICD-10-CM | POA: Diagnosis not present

## 2016-02-01 DIAGNOSIS — E119 Type 2 diabetes mellitus without complications: Secondary | ICD-10-CM | POA: Diagnosis not present

## 2016-02-01 LAB — BASIC METABOLIC PANEL
Anion gap: 14 (ref 5–15)
BUN: 36 mg/dL — ABNORMAL HIGH (ref 6–20)
CO2: 25 mmol/L (ref 22–32)
Calcium: 10.5 mg/dL — ABNORMAL HIGH (ref 8.9–10.3)
Chloride: 94 mmol/L — ABNORMAL LOW (ref 101–111)
Creatinine, Ser: 1.67 mg/dL — ABNORMAL HIGH (ref 0.44–1.00)
GFR calc Af Amer: 40 mL/min — ABNORMAL LOW (ref >60)
GFR calc non Af Amer: 34 mL/min — ABNORMAL LOW (ref >60)
Glucose, Bld: 230 mg/dL — ABNORMAL HIGH (ref 65–99)
Potassium: 5.6 mmol/L — ABNORMAL HIGH (ref 3.5–5.1)
Sodium: 133 mmol/L — ABNORMAL LOW (ref 135–145)

## 2016-02-01 LAB — BRAIN NATRIURETIC PEPTIDE: B Natriuretic Peptide: 23.4 pg/mL (ref 0.0–100.0)

## 2016-02-01 LAB — GLUCOSE, CAPILLARY: Glucose-Capillary: 194 mg/dL — ABNORMAL HIGH (ref 65–99)

## 2016-02-01 MED ORDER — FUROSEMIDE 40 MG PO TABS: 40.0000 mg | ORAL_TABLET | Freq: Every day | ORAL | Status: DC

## 2016-02-01 MED ORDER — SACUBITRIL-VALSARTAN 24-26 MG PO TABS: 1.0000 | ORAL_TABLET | Freq: Two times a day (BID) | ORAL | Status: DC

## 2016-02-01 NOTE — Patient Instructions (Signed)
Stop Ibuprofen   Stop Digoxin  Stop Spironolactone  DO NOT TAKE FUROSEMIDE OR ENTRESTO TODAY, TOMORROW OR Friday  Saturday 4/8 START Furosemide 40 mg DAILY  Saturday 4/8 START Entresto 24/26 mg Twice daily   Your physician recommends that you schedule a follow-up appointment in: 1 week with our NP/PA

## 2016-02-01 NOTE — Progress Notes (Signed)
Patient ID: Robin Owen, female   DOB: 02-Aug-1961, 55 y.o.   MRN: [id number] PCP: Dr Altheimer Cardiology: Dr. Shirlee Latch  HPI: Robin Owen is a 55 year old Caucasian female with past medical history of HTN, HLD, DMII, hypothyroidism and fibromyalgia (on disability). No history of cancer.   Admitted 11/16 through 09/17/15 with increased dyspnea. CXR with pulmonary edema. This prompted and ECHO that showed reduced EF 20%. RHC/LHC showed normal coronaries and reduced cardiac index. Cardiac MRI showed some mid-wall late gadolinium enhancement in the septum.  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 She has been lisinopril for some time. Started on coreg and lasix.  Discharge weight was 163 pounds.   About 2 weeks ago, she developed a flu-like illness with fever/body aches and was given Tamiflu.  Since then, her BP has been noted to be low at cardiac rehab, often down to the 90s systolic.  She denies lightheadedness, falls or syncope.  No dyspnea with usual activities.  No chest pain.  No orthopnea/PND.  Labs yesterday showed AKI and hyperkalemia.  Of note, she takes ibuprofen 800 mg bid. Weight is down 2 lbs.   Test/Procedures  09/15/2015 ECHO EF 20% MV mild regurgitation TV mild regurgitation  09/19/2015 CMRI 1. Severely dilated left ventricle with normal wall thickness and severe systolic dysfunction (LVEF 20-25%) with global hypokinesis and akinesis of the entire septal wall and diffuse mid wall late gadolinium enhancement. 2. Moderate RV dilatation with mild moderate systolic dysfunction. 3. Moderate left and right atrial dilatation. 4. Mild to moderate mitral and mild tricuspid regurgitation. 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  RHC/LHC 09/15/2017  RA 7 PCWP 29 CO 3.11 CI 1.7  Normal Cors  Labs 09/15/2015: BNP 2354 Labs 09/16/2015: TSH 1.99 Labs 09/17/2015: K 3.8 Creatinine 0.91 Labs 09/29/2015: K 4.5 Creatinine 1.09 Labs  10/05/2015: K 5.3 Creatinine 1.21, LDL 74, HCT 37.2 Labs 1/17: SPEP negative Labs 2/17: K 3.9, creatinine 1.34, digoxin 0.7, hemoglobin 12.5 Labs 3/17: K 3.9, creatinine 1.25, hemoglobin 10  ROS: All systems negative except as listed in HPI, PMH and Problem List.  SH:  Social History   Social History  . Marital Status: Married    Spouse Name: N/A  . Number of Children: N/A  . Years of Education: N/A   Occupational History  . Not on file.   Social History Main Topics  . Smoking status: Never Smoker   . Smokeless tobacco: Never Used  . Alcohol Use: No  . Drug Use: No  . Sexual Activity: Yes    Birth Control/ Protection: None   Other Topics Concern  . Not on file   Social History Narrative    FH:  Family History  Problem Relation Age of Onset  . Pulmonary fibrosis Mother   . Hypertension Mother   . Atrial fibrillation Mother   . Hypertension Father   . Diabetes Mellitus II Father   . Kidney disease Father   . Diabetes Mellitus II Brother   . Hypertension Brother   . Atrial fibrillation Maternal Grandmother   . Hypertension Maternal Grandmother   . Stroke Maternal Grandmother     Past Medical History  Diagnosis Date  . Diabetes mellitus without complication (HCC)   . Hypertension   . Fibromyalgia   . Hypothyroidism   . Hyperlipidemia     Current Outpatient Prescriptions  Medication Sig Dispense Refill  . atorvastatin (LIPITOR) 10 MG tablet Take 10 mg by mouth daily.    . Calcium Carbonate-Vitamin D (CALTRATE  600+D) 600-400 MG-UNIT tablet Take 1 tablet by mouth daily.    . carvedilol (COREG) 6.25 MG tablet Take 1.5 tablets (9.375 mg total) by mouth 2 (two) times daily with a meal. 270 tablet 3  . cyclobenzaprine (FLEXERIL) 10 MG tablet Take 10 mg by mouth 2 (two) times daily as needed for muscle spasms (pt takes qhs and PRN).     Marland Kitchen FLUoxetine (PROZAC) 20 MG tablet Take 20 mg by mouth daily.    Marland Kitchen gabapentin (NEURONTIN) 600 MG tablet Take 300-600 mg by mouth 3  (three) times daily as needed (for fibromyalgia). Take 1/2 tablet (300mg ) by mouth in the morning, and afternonn, and 1 tablet (600mg ) at bedtime    . glimepiride (AMARYL) 2 MG tablet Take 2 mg by mouth daily as needed (on average pt takes 3x weekly). Take if BG >160  1  . levothyroxine (SYNTHROID, LEVOTHROID) 88 MCG tablet Take 88 mcg by mouth daily before breakfast.    . Liraglutide (VICTOZA) 18 MG/3ML SOPN Inject 1.8 mg into the skin daily.    . metFORMIN (GLUCOPHAGE) 1000 MG tablet Take 1 tablet (1,000 mg total) by mouth 2 (two) times daily with a meal.    . Methylcellulose, Laxative, (MIRAFIBER) 500 MG TABS Take 500 mg by mouth daily.    . Multiple Vitamin (MULTIVITAMIN WITH MINERALS) TABS tablet Take 1 tablet by mouth daily.    Marland Kitchen NOVOTWIST 32G X 5 MM MISC Use as directed with Victozia.  2  . ONETOUCH VERIO test strip Check blood glucose once daily  2  . traMADol (ULTRAM) 50 MG tablet Take 50 mg by mouth every 6 (six) hours as needed for moderate pain (pt takes qhs and PRN).     Melene Muller ON 02/04/2016] furosemide (LASIX) 40 MG tablet Take 1 tablet (40 mg total) by mouth daily. 60 tablet 3  . [START ON 02/04/2016] sacubitril-valsartan (ENTRESTO) 24-26 MG Take 1 tablet by mouth 2 (two) times daily. 60 tablet 6   No current facility-administered medications for this encounter.    Filed Vitals:   02/01/16 1123  BP: 116/76  Pulse: 98  Weight: 163 lb 12 oz (74.277 kg)  SpO2: 97%    PHYSICAL EXAM:  General:  NAD.  HEENT: normal Neck: supple. JVP flat. Carotids 2+ bilaterally; no bruits. No lymphadenopathy or thryomegaly appreciated. Cor: PMI normal. Regular rate & rhythm. No rubs, gallops or murmurs. Lungs: clear Abdomen: soft, nontender, nondistended. No hepatosplenomegaly. No bruits or masses. Good bowel sounds. Extremities: no cyanosis, clubbing, rash, edema Neuro: alert & orientedx3, cranial nerves grossly intact. Moves all 4 extremities w/o difficulty. Affect pleasant.  ASSESSMENT &  PLAN: 1. Chronic systolic CHF: 09/15/2015 ECHO EF 20%. Had cMRI EF 20-25%, RV mod dilated.  cMRI (11/16) showed an area of mid-wall septal late gadolinium enhancement. NICM perhaps related to HTN versus viral myocarditis. SPEP negative.  09/16/2015 RHC/LHC coronaries ok, low cardiac index 1.7.  NYHA class II symptoms now. Recently, she had a flu-like illness and BP has been lower since (dehydration?) with elevated creatinine and K on labs yesterday.  She is not volume overloaded on exam today.  - Hold Lasix for 3 days, then restart it at 40 mg daily.  - Stop spironolactone and digoxin until she follows up next week. - Continue Coreg to 9.375 mg bid.  - Hold Entresto x 3 days then restart at 24/26 bid. - Plan to repeat echo in 5/16. Narrow QRS noted on EKG 09/16/15 so she would not be a  candidate for CRT.  2. AKI: Creatinine elevated to 1.82.  May be due to dehydration in setting of viral illness + cardiac med use.  Also concerned about chronic use of ibuprofen 800 mg bid.  I may the med changes noted above and also asked her to stop using Ibuprofen.  She has a tramadol prescription.  Recheck BMET today.  3. Fibromyalgia: Disabled since 1996  4. Anemia: Hemoglobin dropped from 12.5 to 10.  No BRBPR or melena.  She does not have periods now.  Will need to repeat labs next week.    Followup next week.   Marca Ancona 02/01/2016

## 2016-02-01 NOTE — Progress Notes (Signed)
Pt has not taken her Digoxin, Spironolactone, Lasix or Entresto this AM as order yesterday

## 2016-02-03 ENCOUNTER — Encounter (HOSPITAL_COMMUNITY)
Admission: RE | Admit: 2016-02-03 | Discharge: 2016-02-03 | Disposition: A | Discharge status: Home / Self Care | Payer: Managed Care, Other (non HMO) | Source: Ambulatory Visit | Attending: Cardiology | Admitting: Cardiology

## 2016-02-03 DIAGNOSIS — I509 Heart failure, unspecified: Secondary | ICD-10-CM | POA: Diagnosis not present

## 2016-02-03 NOTE — Progress Notes (Signed)
Nutrition Note Lab Results  Component Value Date   K 5.6* 02/01/2016   Spoke with pt re: hyperkalemia. Pt educated re: potassium content of foods. Per discussion, pt does not use any salt substitutes (e.g. Nu-Salt/No Salt). Pt is using pink Himalayan sea salt sparingly when sodium needed. Pt given handouts for high potassium and low potassium foods. Pt instructed to follow a diet with 2000 mg of sodium or less daily. Pt expressed understanding of the information reviewed. Continue client-centered nutrition education by RD as part of interdisciplinary care.  Monitor and evaluate progress toward nutrition goal with team.  Mickle Plumb, M.Ed, RD, LDN, CDE 02/03/2016 2:19 PM

## 2016-02-03 NOTE — Progress Notes (Signed)
Incomplete Session Note  Patient Details  Name: Robin Owen MRN: [id number] Date of Birth: 1961/05/15 Referring Provider:  Altheimer, Casimiro Needle, MD  Robin Owen did not complete her rehab session.  Patient potassium level on 02/01/16 was 5.6 Hessie Diener RN LVAD coordinator paged, was unable to get through at the heart failure clinic. Kirt Boys advised that Robin Owen hold off on exercising until reevaluation at the heart failure.Iaisha hopes to return on Friday. Vital signs stable today. Patient states understanding.

## 2016-02-06 ENCOUNTER — Encounter (HOSPITAL_COMMUNITY): Payer: Managed Care, Other (non HMO)

## 2016-02-08 ENCOUNTER — Encounter (HOSPITAL_COMMUNITY)
Admission: RE | Admit: 2016-02-08 | Discharge: 2016-02-08 | Disposition: A | Discharge status: Home / Self Care | Payer: Managed Care, Other (non HMO) | Source: Ambulatory Visit | Attending: Cardiology | Admitting: Cardiology

## 2016-02-08 ENCOUNTER — Ambulatory Visit (HOSPITAL_COMMUNITY)
Admission: RE | Admit: 2016-02-08 | Discharge: 2016-02-08 | Disposition: A | Discharge status: Home / Self Care | Payer: Managed Care, Other (non HMO) | Source: Ambulatory Visit | Attending: Internal Medicine | Admitting: Internal Medicine

## 2016-02-08 VITALS — BP 100/60 | HR 105 | Wt 163.4 lb

## 2016-02-08 DIAGNOSIS — N179 Acute kidney failure, unspecified: Secondary | ICD-10-CM | POA: Diagnosis not present

## 2016-02-08 DIAGNOSIS — E039 Hypothyroidism, unspecified: Secondary | ICD-10-CM | POA: Insufficient documentation

## 2016-02-08 DIAGNOSIS — R000 Tachycardia, unspecified: Secondary | ICD-10-CM | POA: Diagnosis not present

## 2016-02-08 DIAGNOSIS — E785 Hyperlipidemia, unspecified: Secondary | ICD-10-CM | POA: Diagnosis not present

## 2016-02-08 DIAGNOSIS — D649 Anemia, unspecified: Secondary | ICD-10-CM | POA: Insufficient documentation

## 2016-02-08 DIAGNOSIS — E119 Type 2 diabetes mellitus without complications: Secondary | ICD-10-CM | POA: Diagnosis not present

## 2016-02-08 DIAGNOSIS — Z833 Family history of diabetes mellitus: Secondary | ICD-10-CM | POA: Diagnosis not present

## 2016-02-08 DIAGNOSIS — Z7984 Long term (current) use of oral hypoglycemic drugs: Secondary | ICD-10-CM | POA: Insufficient documentation

## 2016-02-08 DIAGNOSIS — Z8249 Family history of ischemic heart disease and other diseases of the circulatory system: Secondary | ICD-10-CM | POA: Diagnosis not present

## 2016-02-08 DIAGNOSIS — M797 Fibromyalgia: Secondary | ICD-10-CM | POA: Insufficient documentation

## 2016-02-08 DIAGNOSIS — I110 Hypertensive heart disease with heart failure: Secondary | ICD-10-CM | POA: Diagnosis not present

## 2016-02-08 DIAGNOSIS — I429 Cardiomyopathy, unspecified: Secondary | ICD-10-CM

## 2016-02-08 DIAGNOSIS — I428 Other cardiomyopathies: Secondary | ICD-10-CM

## 2016-02-08 DIAGNOSIS — I5022 Chronic systolic (congestive) heart failure: Secondary | ICD-10-CM | POA: Diagnosis present

## 2016-02-08 DIAGNOSIS — Z79899 Other long term (current) drug therapy: Secondary | ICD-10-CM | POA: Diagnosis not present

## 2016-02-08 LAB — BASIC METABOLIC PANEL
Anion gap: 14 (ref 5–15)
BUN: 12 mg/dL (ref 6–20)
CO2: 26 mmol/L (ref 22–32)
Calcium: 8.9 mg/dL (ref 8.9–10.3)
Chloride: 99 mmol/L — ABNORMAL LOW (ref 101–111)
Creatinine, Ser: 1.15 mg/dL — ABNORMAL HIGH (ref 0.44–1.00)
GFR calc Af Amer: >60 mL/min (ref >60)
GFR calc non Af Amer: 54 mL/min — ABNORMAL LOW (ref >60)
Glucose, Bld: 227 mg/dL — ABNORMAL HIGH (ref 65–99)
Potassium: 4.2 mmol/L (ref 3.5–5.1)
Sodium: 139 mmol/L (ref 135–145)

## 2016-02-08 LAB — CBC
HCT: 30.5 % — ABNORMAL LOW (ref 36.0–46.0)
Hemoglobin: 10.1 g/dL — ABNORMAL LOW (ref 12.0–15.0)
MCH: 31.5 pg (ref 26.0–34.0)
MCHC: 33.1 g/dL (ref 30.0–36.0)
MCV: 95 fL (ref 78.0–100.0)
Platelets: 270 10*3/uL (ref 150–400)
RBC: 3.21 MIL/uL — ABNORMAL LOW (ref 3.87–5.11)
RDW: 15.6 % — ABNORMAL HIGH (ref 11.5–15.5)
WBC: 8 10*3/uL (ref 4.0–10.5)

## 2016-02-08 MED ORDER — IVABRADINE HCL 5 MG PO TABS: 2.5000 mg | ORAL_TABLET | Freq: Two times a day (BID) | ORAL | Status: DC

## 2016-02-08 NOTE — Progress Notes (Signed)
Patient ID: Robin Owen, female   DOB: [birth year], 55 y.o.   MRN: [id number] PCP: Dr Altheimer Cardiology: Dr. Shirlee Latch  HPI: Robin Owen is a 55 year old Caucasian female with past medical history of HTN, HLD, DMII, hypothyroidism and fibromyalgia (on disability). No history of cancer.   Admitted 11/16 through 09/17/15 with increased dyspnea. CXR with pulmonary edema. This prompted and ECHO that showed reduced EF 20%. RHC/LHC showed normal coronaries and reduced cardiac index. Cardiac MRI showed some mid-wall late gadolinium enhancement in the septum.  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 She has been lisinopril for some time. Started on coreg and lasix.  Discharge weight was 163 pounds.   Today she returns for HF follow up. Last visit she had the flu and lasix /entresto was held for 3 days. Robin Owen and dig stopped due to elevated renal function. Overall feeling much better Denies SOB/PND/Orthopnea. Taking all meds. Attending cardiac rehab 3 days a week.   Test/Procedures  09/15/2015 ECHO EF 20% MV mild regurgitation TV mild regurgitation  09/19/2015 CMRI 1. Severely dilated left ventricle with normal wall thickness and severe systolic dysfunction (LVEF 20-25%) with global hypokinesis and akinesis of the entire septal wall and diffuse mid wall late gadolinium enhancement. 2. Moderate RV dilatation with mild moderate systolic dysfunction. 3. Moderate left and right atrial dilatation. 4. Mild to moderate mitral and mild tricuspid regurgitation. 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  RHC/LHC 09/15/2017  RA 7 PCWP 29 CO 3.11 CI 1.7  Normal Cors  Labs 09/15/2015: BNP 2354 Labs 09/16/2015: TSH 1.99 Labs 09/17/2015: K 3.8 Creatinine 0.91 Labs 09/29/2015: K 4.5 Creatinine 1.09 Labs 10/05/2015: K 5.3 Creatinine 1.21, LDL 74, HCT 37.2 Labs 1/17: SPEP negative Labs 2/17: K 3.9, creatinine 1.34, digoxin 0.7, hemoglobin 12.5 Labs  3/17: K 3.9, creatinine 1.25, hemoglobin 10  ROS: All systems negative except as listed in HPI, PMH and Problem List.  SH:  Social History   Social History  . Marital Status: Married    Spouse Name: N/A  . Number of Children: N/A  . Years of Education: N/A   Occupational History  . Not on file.   Social History Main Topics  . Smoking status: Never Smoker   . Smokeless tobacco: Never Used  . Alcohol Use: No  . Drug Use: No  . Sexual Activity: Yes    Birth Control/ Protection: None   Other Topics Concern  . Not on file   Social History Narrative    FH:  Family History  Problem Relation Age of Onset  . Pulmonary fibrosis Mother   . Hypertension Mother   . Atrial fibrillation Mother   . Hypertension Father   . Diabetes Mellitus II Father   . Kidney disease Father   . Diabetes Mellitus II Brother   . Hypertension Brother   . Atrial fibrillation Maternal Grandmother   . Hypertension Maternal Grandmother   . Stroke Maternal Grandmother     Past Medical History  Diagnosis Date  . Diabetes mellitus without complication (HCC)   . Hypertension   . Fibromyalgia   . Hypothyroidism   . Hyperlipidemia     Current Outpatient Prescriptions  Medication Sig Dispense Refill  . atorvastatin (LIPITOR) 10 MG tablet Take 10 mg by mouth daily.    . Calcium Carbonate-Vitamin D (CALTRATE 600+D) 600-400 MG-UNIT tablet Take 1 tablet by mouth daily.    . carvedilol (COREG) 6.25 MG tablet Take 1.5 tablets (9.375 mg total) by mouth 2 (two) times daily  with a meal. 270 tablet 3  . cyclobenzaprine (FLEXERIL) 10 MG tablet Take 10 mg by mouth 2 (two) times daily as needed for muscle spasms (pt takes qhs and PRN).     Marland Kitchen FLUoxetine (PROZAC) 20 MG tablet Take 20 mg by mouth daily.    . furosemide (LASIX) 40 MG tablet Take 1 tablet (40 mg total) by mouth daily. 60 tablet 3  . gabapentin (NEURONTIN) 600 MG tablet Take 300-600 mg by mouth 3 (three) times daily as needed (for fibromyalgia). Take  1/2 tablet ( ) by mouth in the morning, and afternonn, and 1 tablet ( ) at bedtime    . glimepiride (AMARYL) 2 MG tablet Take 2 mg by mouth daily as needed (on average pt takes 3x weekly). Take if BG >160  1  . levofloxacin (LEVAQUIN) 500 MG tablet Take 500 mg by mouth daily.    Marland Kitchen levothyroxine (SYNTHROID, LEVOTHROID) 88 MCG tablet Take 88 mcg by mouth daily before breakfast.    . Liraglutide (VICTOZA) 18 MG/3ML SOPN Inject 1.8 mg into the skin daily.    . metFORMIN (GLUCOPHAGE) 1000 MG tablet Take 1 tablet (1,000 mg total) by mouth 2 (two) times daily with a meal.    . Methylcellulose, Laxative, (MIRAFIBER) 500 MG TABS Take 500 mg by mouth daily.    . Multiple Vitamin (MULTIVITAMIN WITH MINERALS) TABS tablet Take 1 tablet by mouth daily.    Marland Kitchen NOVOTWIST 32G X 5 MM MISC Use as directed with Victozia.  2  . ONETOUCH VERIO test strip Check blood glucose once daily  2  . sacubitril-valsartan (ENTRESTO) 24-26 MG Take 1 tablet by mouth 2 (two) times daily. 60 tablet 6  . traMADol (ULTRAM) 50 MG tablet Take 50 mg by mouth every 6 (six) hours as needed for moderate pain (pt takes qhs and PRN).      No current facility-administered medications for this encounter.    Filed Vitals:   02/08/16 1503  BP: 100/60  Pulse: 105  Weight: 163 lb 6.4 oz (74.118 kg)  SpO2: 95%    PHYSICAL EXAM:  General:  NAD. Ambulated in the clinic without difficulty. Daughter present.  HEENT: normal Neck: supple. JVP flat. Carotids 2+ bilaterally; no bruits. No lymphadenopathy or thryomegaly appreciated. Cor: PMI normal. Regular rate & rhythm. No rubs, gallops or murmurs. Lungs: clear Abdomen: soft, nontender, nondistended. No hepatosplenomegaly. No bruits or masses. Good bowel sounds. Extremities: no cyanosis, clubbing, rash, edema Neuro: alert & orientedx3, cranial nerves grossly intact. Moves all 4 extremities w/o difficulty. Affect pleasant.  ASSESSMENT & PLAN: 1. Chronic systolic CHF: 09/15/2015 ECHO EF  20%. Had cMRI EF 20-25%, RV mod dilated.  cMRI (11/16) showed an area of mid-wall septal late gadolinium enhancement. NICM perhaps related to HTN versus viral myocarditis. SPEP negative.  09/16/2015 RHC/LHC coronaries ok, low cardiac index 1.7.   NYHA class II symptoms now. Volume status stable 40 mg daily.  -  Continue Coreg to 9.375 mg bid.  - Continue  Entresto  24/26 bid. - Add 2.5 mg ivabradine twice a day.  - Plan to repeat echo in 5/16. Narrow QRS noted on EKG 09/16/15 so she would not be a candidate for CRT.  2. AKI: Last visit elevated to 1.67. Recheck BMET today.  3. Fibromyalgia: Disabled since 1996  4. Anemia: Last visit Hemoglobin dropped from 12.5 to 10.  No BRBPR or melena.  She does not have periods now.   Check CBC .  5. Tachycardia- Add ivabradine as above.  Follow up in 3 weeks with Dr Shirlee Latch.    Amy Clegg NP-C  02/08/2016

## 2016-02-08 NOTE — Patient Instructions (Signed)
Labs today  START Corlanor 2.5 mg, one-half tab twice a day  Your physician recommends that you schedule a follow-up appointment in: 4 weeks In the [facility]   Do the following things EVERYDAY: 1) Weigh yourself in the morning before breakfast. Write it down and keep it in a log. 2) Take your medicines as prescribed 3) Eat low salt foods-Limit salt (sodium) to 2000 mg per day.  4) Stay as active as you can everyday 5) Limit all fluids for the day to less than 2 liters 6)

## 2016-02-10 ENCOUNTER — Encounter (HOSPITAL_COMMUNITY): Payer: Managed Care, Other (non HMO)

## 2016-02-13 ENCOUNTER — Encounter (HOSPITAL_COMMUNITY)
Admission: RE | Admit: 2016-02-13 | Discharge: 2016-02-13 | Disposition: A | Discharge status: Home / Self Care | Payer: Managed Care, Other (non HMO) | Source: Ambulatory Visit | Attending: Cardiology | Admitting: Cardiology

## 2016-02-13 DIAGNOSIS — I509 Heart failure, unspecified: Secondary | ICD-10-CM | POA: Diagnosis not present

## 2016-02-15 ENCOUNTER — Encounter (HOSPITAL_COMMUNITY)
Admission: RE | Admit: 2016-02-15 | Discharge: 2016-02-15 | Disposition: A | Discharge status: Home / Self Care | Payer: Managed Care, Other (non HMO) | Source: Ambulatory Visit | Attending: Cardiology | Admitting: Cardiology

## 2016-02-15 DIAGNOSIS — I509 Heart failure, unspecified: Secondary | ICD-10-CM | POA: Diagnosis not present

## 2016-02-17 ENCOUNTER — Encounter (HOSPITAL_COMMUNITY)
Admission: RE | Admit: 2016-02-17 | Discharge: 2016-02-17 | Disposition: A | Discharge status: Home / Self Care | Payer: Managed Care, Other (non HMO) | Source: Ambulatory Visit | Attending: Cardiology | Admitting: Cardiology

## 2016-02-17 DIAGNOSIS — I509 Heart failure, unspecified: Secondary | ICD-10-CM | POA: Diagnosis not present

## 2016-02-20 ENCOUNTER — Encounter (HOSPITAL_COMMUNITY)
Admission: RE | Admit: 2016-02-20 | Discharge: 2016-02-20 | Disposition: A | Discharge status: Home / Self Care | Payer: Managed Care, Other (non HMO) | Source: Ambulatory Visit | Attending: Cardiology | Admitting: Cardiology

## 2016-02-20 DIAGNOSIS — I509 Heart failure, unspecified: Secondary | ICD-10-CM | POA: Diagnosis not present

## 2016-02-22 ENCOUNTER — Encounter (HOSPITAL_COMMUNITY)
Admission: RE | Admit: 2016-02-22 | Discharge: 2016-02-22 | Disposition: A | Discharge status: Home / Self Care | Payer: Managed Care, Other (non HMO) | Source: Ambulatory Visit | Attending: Cardiology | Admitting: Cardiology

## 2016-02-22 DIAGNOSIS — I509 Heart failure, unspecified: Secondary | ICD-10-CM | POA: Diagnosis not present

## 2016-02-24 ENCOUNTER — Encounter (HOSPITAL_COMMUNITY)
Admission: RE | Admit: 2016-02-24 | Discharge: 2016-02-24 | Disposition: A | Discharge status: Home / Self Care | Payer: Managed Care, Other (non HMO) | Source: Ambulatory Visit | Attending: Cardiology | Admitting: Cardiology

## 2016-02-24 DIAGNOSIS — I509 Heart failure, unspecified: Secondary | ICD-10-CM | POA: Diagnosis not present

## 2016-02-24 NOTE — Progress Notes (Signed)
Daily Session Note  Patient Details  Name: CIERA BECKUM MRN: [id number] Date of Birth: 06-26-1961 Referring Provider:    Encounter Date: 02/24/2016  Check In:     Session Check In - 02/24/16 1423    Check-In   Location [facility]   Staff Present Maurice Small, RN, BSN;Joann Rion, RN, Marga Melnick, RN, Deland Pretty, MS, ACSM CEP, Exercise Physiologist   Supervising physician immediately available to respond to emergencies [facility] immediately available   Physician(s) DR. Rai   Medication changes reported     No   Fall or balance concerns reported    No   Warm-up and Cool-down Performed as group-led Location manager Performed Yes   Pain Assessment   Currently in Pain? No/denies      Capillary Blood Glucose: No results found for this or any previous visit (from the past 24 hour(s)).   Goals Met:  Independence with exercise equipment  Goals Unmet:  na  Comments: pt left exercise early today due to UTI symptoms.   Pt has contacted her PCP and provided a urine sample. Pt is awaiting PCP recommendation. Pt has been on antibiotic past several weeks for urinary tract infection that is not resolving.  Pt instructed to seek urgent care if symptoms worsen over weekend.  Understanding verbalized   Dr. Fransico Him is Medical Director for Cardiac Rehab at [facility].

## 2016-02-27 ENCOUNTER — Encounter (HOSPITAL_COMMUNITY)
Admission: RE | Admit: 2016-02-27 | Discharge: 2016-02-27 | Disposition: A | Discharge status: Home / Self Care | Payer: Managed Care, Other (non HMO) | Source: Ambulatory Visit | Attending: Cardiology | Admitting: Cardiology

## 2016-02-27 DIAGNOSIS — I509 Heart failure, unspecified: Secondary | ICD-10-CM | POA: Insufficient documentation

## 2016-02-29 ENCOUNTER — Encounter (HOSPITAL_COMMUNITY)
Admission: RE | Admit: 2016-02-29 | Discharge: 2016-02-29 | Disposition: A | Discharge status: Home / Self Care | Payer: Managed Care, Other (non HMO) | Source: Ambulatory Visit | Attending: Cardiology | Admitting: Cardiology

## 2016-02-29 DIAGNOSIS — I509 Heart failure, unspecified: Secondary | ICD-10-CM | POA: Diagnosis not present

## 2016-03-02 ENCOUNTER — Encounter (HOSPITAL_COMMUNITY): Payer: Self-pay

## 2016-03-02 ENCOUNTER — Encounter (HOSPITAL_COMMUNITY)
Admission: RE | Admit: 2016-03-02 | Discharge: 2016-03-02 | Disposition: A | Discharge status: Home / Self Care | Payer: Managed Care, Other (non HMO) | Source: Ambulatory Visit | Attending: Cardiology | Admitting: Cardiology

## 2016-03-02 ENCOUNTER — Ambulatory Visit (HOSPITAL_COMMUNITY)
Admission: RE | Admit: 2016-03-02 | Discharge: 2016-03-02 | Disposition: A | Discharge status: Home / Self Care | Payer: Managed Care, Other (non HMO) | Source: Ambulatory Visit | Attending: Cardiology | Admitting: Cardiology

## 2016-03-02 VITALS — BP 118/88 | HR 86 | Wt 161.0 lb

## 2016-03-02 DIAGNOSIS — Z8249 Family history of ischemic heart disease and other diseases of the circulatory system: Secondary | ICD-10-CM | POA: Insufficient documentation

## 2016-03-02 DIAGNOSIS — M797 Fibromyalgia: Secondary | ICD-10-CM | POA: Diagnosis not present

## 2016-03-02 DIAGNOSIS — Z79899 Other long term (current) drug therapy: Secondary | ICD-10-CM | POA: Insufficient documentation

## 2016-03-02 DIAGNOSIS — Z7984 Long term (current) use of oral hypoglycemic drugs: Secondary | ICD-10-CM | POA: Insufficient documentation

## 2016-03-02 DIAGNOSIS — E785 Hyperlipidemia, unspecified: Secondary | ICD-10-CM | POA: Diagnosis not present

## 2016-03-02 DIAGNOSIS — I5022 Chronic systolic (congestive) heart failure: Secondary | ICD-10-CM | POA: Diagnosis present

## 2016-03-02 DIAGNOSIS — E119 Type 2 diabetes mellitus without complications: Secondary | ICD-10-CM | POA: Insufficient documentation

## 2016-03-02 DIAGNOSIS — I428 Other cardiomyopathies: Secondary | ICD-10-CM | POA: Diagnosis not present

## 2016-03-02 DIAGNOSIS — Z7982 Long term (current) use of aspirin: Secondary | ICD-10-CM | POA: Insufficient documentation

## 2016-03-02 DIAGNOSIS — N179 Acute kidney failure, unspecified: Secondary | ICD-10-CM | POA: Diagnosis not present

## 2016-03-02 DIAGNOSIS — E039 Hypothyroidism, unspecified: Secondary | ICD-10-CM | POA: Insufficient documentation

## 2016-03-02 DIAGNOSIS — I110 Hypertensive heart disease with heart failure: Secondary | ICD-10-CM | POA: Insufficient documentation

## 2016-03-02 DIAGNOSIS — I509 Heart failure, unspecified: Secondary | ICD-10-CM | POA: Diagnosis not present

## 2016-03-02 DIAGNOSIS — Z833 Family history of diabetes mellitus: Secondary | ICD-10-CM | POA: Diagnosis not present

## 2016-03-02 LAB — GLUCOSE, CAPILLARY: Glucose-Capillary: 131 mg/dL — ABNORMAL HIGH (ref 65–99)

## 2016-03-02 MED ORDER — CARVEDILOL 12.5 MG PO TABS: 12.5000 mg | ORAL_TABLET | Freq: Two times a day (BID) | ORAL | Status: DC

## 2016-03-02 MED ORDER — SPIRONOLACTONE 25 MG PO TABS: 12.5000 mg | ORAL_TABLET | Freq: Every day | ORAL | Status: DC

## 2016-03-02 NOTE — Patient Instructions (Signed)
INCREASE Carvedilol (coreg) to 12.5 mg twice daily.  START Spironolactone 12.5 mg (1/2 tab) once daily.  Will schedule you for an echocardiogram and labs at [facility]. Address: [address] ([address] Floor), [place], [place] [address]  Phone: [phone]  Follow up 1 month with Dr. Shirlee Latch.  Do the following things EVERYDAY: 1) Weigh yourself in the morning before breakfast. Write it down and keep it in a log. 2) Take your medicines as prescribed 3) Eat low salt foods-Limit salt (sodium) to 2000 mg per day.  4) Stay as active as you can everyday 5) Limit all fluids for the day to less than 2 liters

## 2016-03-04 NOTE — Progress Notes (Signed)
Patient ID: Robin Owen, female   DOB: 27-Aug-1961, 55 y.o.   MRN: [id number] PCP: Dr Altheimer Cardiology: Dr. Shirlee Latch  HPI: Robin Owen is a 55 year old Caucasian female with past medical history of HTN, HLD, DMII, hypothyroidism and fibromyalgia (on disability). No history of cancer.   Admitted 11/16 through 09/17/15 with increased dyspnea. CXR with pulmonary edema. This prompted and ECHO that showed reduced EF 20%. RHC/LHC showed normal coronaries and reduced cardiac index. Cardiac MRI showed some mid-wall late gadolinium enhancement in the septum.  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 She has been lisinopril for some time. Started on coreg and lasix.  Discharge weight was 163 pounds.   She developed AKI and hyperkalemia in the setting of high dose Ibuprofen.  I asked her to stop this and started her on tramadol as needed instead.  She is stable symptomatically.  She is short of breath walking up steps.  She does ok on flat ground. No orthopnea/PND.  No lightheadedness/syncope. She is in cardiac rehab. Weight is down 2 lbs.   Test/Procedures  09/15/2015 ECHO EF 20% MV mild regurgitation TV mild regurgitation  09/19/2015 CMRI 1. Severely dilated left ventricle with normal wall thickness and severe systolic dysfunction (LVEF 20-25%) with global hypokinesis and akinesis of the entire septal wall and diffuse mid wall late gadolinium enhancement. 2. Moderate RV dilatation with mild moderate systolic dysfunction. 3. Moderate left and right atrial dilatation. 4. Mild to moderate mitral and mild tricuspid regurgitation. 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  RHC/LHC 09/15/2017  RA 7 PCWP 29 CO 3.11 CI 1.7  Normal Cors  Labs 09/15/2015: BNP 2354 Labs 09/16/2015: TSH 1.99 Labs 09/17/2015: K 3.8 Creatinine 0.91 Labs 09/29/2015: K 4.5 Creatinine 1.09 Labs 10/05/2015: K 5.3 Creatinine 1.21, LDL 74, HCT 37.2 Labs 1/17: SPEP  negative Labs 2/17: K 3.9, creatinine 1.34, digoxin 0.7, hemoglobin 12.5 Labs 3/17: K 3.9, creatinine 1.25, hemoglobin 10 Labs 4/17: K 4.2, creatinine 1.15  ROS: All systems negative except as listed in HPI, PMH and Problem List.  SH:  Social History   Social History  . Marital Status: Married    Spouse Name: N/A  . Number of Children: N/A  . Years of Education: N/A   Occupational History  . Not on file.   Social History Main Topics  . Smoking status: Never Smoker   . Smokeless tobacco: Never Used  . Alcohol Use: No  . Drug Use: No  . Sexual Activity: Yes    Birth Control/ Protection: None   Other Topics Concern  . Not on file   Social History Narrative    FH:  Family History  Problem Relation Age of Onset  . Pulmonary fibrosis Mother   . Hypertension Mother   . Atrial fibrillation Mother   . Hypertension Father   . Diabetes Mellitus II Father   . Kidney disease Father   . Diabetes Mellitus II Brother   . Hypertension Brother   . Atrial fibrillation Maternal Grandmother   . Hypertension Maternal Grandmother   . Stroke Maternal Grandmother     Past Medical History  Diagnosis Date  . Diabetes mellitus without complication (HCC)   . Hypertension   . Fibromyalgia   . Hypothyroidism   . Hyperlipidemia     Current Outpatient Prescriptions  Medication Sig Dispense Refill  . aspirin 81 MG tablet Take 81 mg by mouth daily.    Marland Kitchen atorvastatin (LIPITOR) 10 MG tablet Take 10 mg by mouth daily.    Marland Kitchen  Calcium Carbonate-Vitamin D (CALTRATE 600+D) 600-400 MG-UNIT tablet Take 1 tablet by mouth daily.    . carvedilol (COREG) 12.5 MG tablet Take 1 tablet (12.5 mg total) by mouth 2 (two) times daily with a meal. 60 tablet 6  . cyclobenzaprine (FLEXERIL) 10 MG tablet Take 10 mg by mouth 2 (two) times daily as needed for muscle spasms (pt takes qhs and PRN).     Marland Kitchen FLUoxetine (PROZAC) 20 MG tablet Take 20 mg by mouth daily.    . furosemide (LASIX) 40 MG tablet Take 1 tablet  (40 mg total) by mouth daily. 60 tablet 3  . gabapentin (NEURONTIN) 600 MG tablet Take 300-600 mg by mouth 3 (three) times daily as needed (for fibromyalgia). Take 1/2 tablet (300mg ) by mouth in the morning, and afternonn, and 1 tablet (600mg ) at bedtime    . glimepiride (AMARYL) 2 MG tablet Take 2 mg by mouth daily as needed (on average pt takes 3x weekly). Take if BG >160  1  . ivabradine (CORLANOR) 5 MG TABS tablet Take 0.5 tablets (2.5 mg total) by mouth 2 (two) times daily with a meal. 60 tablet 6  . levothyroxine (SYNTHROID, LEVOTHROID) 88 MCG tablet Take 88 mcg by mouth daily before breakfast.    . Liraglutide (VICTOZA) 18 MG/3ML SOPN Inject 1.8 mg into the skin daily.    . metFORMIN (GLUCOPHAGE) 1000 MG tablet Take 1 tablet (1,000 mg total) by mouth 2 (two) times daily with a meal.    . Methylcellulose, Laxative, (MIRAFIBER) 500 MG TABS Take 500 mg by mouth daily.    . Multiple Vitamin (MULTIVITAMIN WITH MINERALS) TABS tablet Take 1 tablet by mouth daily.    Marland Kitchen NOVOTWIST 32G X 5 MM MISC Use as directed with Victozia.  2  . ONETOUCH VERIO test strip Check blood glucose once daily  2  . sacubitril-valsartan (ENTRESTO) 24-26 MG Take 1 tablet by mouth 2 (two) times daily. 60 tablet 6  . traMADol (ULTRAM) 50 MG tablet Take 50 mg by mouth every 6 (six) hours as needed for moderate pain (pt takes qhs and PRN).     Marland Kitchen spironolactone (ALDACTONE) 25 MG tablet Take 0.5 tablets (12.5 mg total) by mouth daily. 45 tablet 3   No current facility-administered medications for this encounter.    Filed Vitals:   03/02/16 1501  BP: 118/88  Pulse: 86  Weight: 161 lb (73.029 kg)  SpO2: 96%    PHYSICAL EXAM:  General:  NAD.  HEENT: normal Neck: supple. JVP 8 cm. Carotids 2+ bilaterally; no bruits. No lymphadenopathy or thryomegaly appreciated. Cor: PMI normal. Regular rate & rhythm. No rubs, gallops or murmurs. Lungs: clear Abdomen: soft, nontender, nondistended. No hepatosplenomegaly. No bruits or  masses. Good bowel sounds. Extremities: no cyanosis, clubbing, rash, edema Neuro: alert & orientedx3, cranial nerves grossly intact. Moves all 4 extremities w/o difficulty. Affect pleasant.  ASSESSMENT & PLAN: 1. Chronic systolic CHF: 09/15/2015 ECHO EF 20%. Had cMRI EF 20-25%, RV mod dilated.  cMRI (11/16) showed an area of mid-wall septal late gadolinium enhancement. NICM perhaps related to HTN versus viral myocarditis. SPEP negative.  09/16/2015 RHC/LHC coronaries ok, low cardiac index 1.7.  NYHA class II symptoms now.  She is not volume overloaded on exam today.  - Continue Lasix 40 mg daily.   - Restart spironolactone at 12.5 mg daily.  Needs BMET in 2 wks. - Increase Coreg to 12.5 mg bid.  - Continue Entresto 24/26 bid. - Repeat echo in about 2 wks. Narrow  QRS noted on EKG 09/16/15 so she would not be a candidate for CRT.  2. AKI: Episode was related to viral illness + large ibuprofen dose + her other cardiac meds.  I have asked her to stop ibuprofen altogether.  3. Fibromyalgia: Disabled since [address] 03/04/2016

## 2016-03-05 ENCOUNTER — Encounter (HOSPITAL_COMMUNITY)
Admission: RE | Admit: 2016-03-05 | Discharge: 2016-03-05 | Disposition: A | Discharge status: Home / Self Care | Payer: Managed Care, Other (non HMO) | Source: Ambulatory Visit | Attending: Cardiology | Admitting: Cardiology

## 2016-03-05 DIAGNOSIS — I509 Heart failure, unspecified: Secondary | ICD-10-CM | POA: Diagnosis not present

## 2016-03-07 ENCOUNTER — Encounter (HOSPITAL_COMMUNITY)
Admission: RE | Admit: 2016-03-07 | Discharge: 2016-03-07 | Disposition: A | Discharge status: Home / Self Care | Payer: Managed Care, Other (non HMO) | Source: Ambulatory Visit | Attending: Cardiology | Admitting: Cardiology

## 2016-03-07 DIAGNOSIS — I509 Heart failure, unspecified: Secondary | ICD-10-CM | POA: Diagnosis not present

## 2016-03-09 ENCOUNTER — Encounter (HOSPITAL_COMMUNITY)
Admission: RE | Admit: 2016-03-09 | Discharge: 2016-03-09 | Disposition: A | Discharge status: Home / Self Care | Payer: Managed Care, Other (non HMO) | Source: Ambulatory Visit | Attending: Cardiology | Admitting: Cardiology

## 2016-03-09 DIAGNOSIS — I509 Heart failure, unspecified: Secondary | ICD-10-CM | POA: Diagnosis not present

## 2016-03-12 ENCOUNTER — Encounter (HOSPITAL_COMMUNITY)
Admission: RE | Admit: 2016-03-12 | Discharge: 2016-03-12 | Disposition: A | Discharge status: Home / Self Care | Payer: Managed Care, Other (non HMO) | Source: Ambulatory Visit | Attending: Cardiology | Admitting: Cardiology

## 2016-03-12 DIAGNOSIS — I509 Heart failure, unspecified: Secondary | ICD-10-CM | POA: Diagnosis not present

## 2016-03-14 ENCOUNTER — Encounter (HOSPITAL_COMMUNITY)
Admission: RE | Admit: 2016-03-14 | Discharge: 2016-03-14 | Disposition: A | Discharge status: Home / Self Care | Payer: Managed Care, Other (non HMO) | Source: Ambulatory Visit | Attending: Cardiology | Admitting: Cardiology

## 2016-03-14 DIAGNOSIS — I509 Heart failure, unspecified: Secondary | ICD-10-CM | POA: Diagnosis not present

## 2016-03-15 ENCOUNTER — Other Ambulatory Visit (INDEPENDENT_AMBULATORY_CARE_PROVIDER_SITE_OTHER): Payer: Managed Care, Other (non HMO) | Admitting: *Deleted

## 2016-03-15 ENCOUNTER — Ambulatory Visit (HOSPITAL_COMMUNITY): Payer: Managed Care, Other (non HMO) | Attending: Cardiology

## 2016-03-15 ENCOUNTER — Other Ambulatory Visit: Payer: Self-pay

## 2016-03-15 DIAGNOSIS — Z8249 Family history of ischemic heart disease and other diseases of the circulatory system: Secondary | ICD-10-CM | POA: Insufficient documentation

## 2016-03-15 DIAGNOSIS — I5022 Chronic systolic (congestive) heart failure: Secondary | ICD-10-CM | POA: Insufficient documentation

## 2016-03-15 DIAGNOSIS — E785 Hyperlipidemia, unspecified: Secondary | ICD-10-CM | POA: Insufficient documentation

## 2016-03-15 DIAGNOSIS — I110 Hypertensive heart disease with heart failure: Secondary | ICD-10-CM | POA: Insufficient documentation

## 2016-03-15 DIAGNOSIS — E119 Type 2 diabetes mellitus without complications: Secondary | ICD-10-CM | POA: Diagnosis not present

## 2016-03-15 LAB — BASIC METABOLIC PANEL
BUN: 13 mg/dL (ref 7–25)
CO2: 31 mmol/L (ref 20–31)
Calcium: 9.6 mg/dL (ref 8.6–10.4)
Chloride: 98 mmol/L (ref 98–110)
Creat: 0.93 mg/dL (ref 0.50–1.05)
Glucose, Bld: 164 mg/dL — ABNORMAL HIGH (ref 65–99)
Potassium: 4.3 mmol/L (ref 3.5–5.3)
Sodium: 137 mmol/L (ref 135–146)

## 2016-03-15 NOTE — Addendum Note (Signed)
Addended by: Tonita Phoenix on: 03/15/2016 01:54 PM   Modules accepted: Orders

## 2016-03-15 NOTE — Addendum Note (Signed)
Addended by: Tonita Phoenix on: 03/15/2016 01:58 PM   Modules accepted: Orders

## 2016-03-15 NOTE — Addendum Note (Signed)
Addended by: BOWDEN, ROBIN K on: 03/15/2016 01:58 PM   Modules accepted: Orders  

## 2016-03-15 NOTE — Addendum Note (Signed)
Addended by: Tonita Phoenix on: 03/15/2016 01:57 PM   Modules accepted: Orders

## 2016-03-16 ENCOUNTER — Encounter (HOSPITAL_COMMUNITY)
Admission: RE | Admit: 2016-03-16 | Discharge: 2016-03-16 | Disposition: A | Discharge status: Home / Self Care | Payer: Managed Care, Other (non HMO) | Source: Ambulatory Visit | Attending: Cardiology | Admitting: Cardiology

## 2016-03-16 DIAGNOSIS — I509 Heart failure, unspecified: Secondary | ICD-10-CM | POA: Diagnosis not present

## 2016-03-19 ENCOUNTER — Encounter (HOSPITAL_COMMUNITY)
Admission: RE | Admit: 2016-03-19 | Discharge: 2016-03-19 | Disposition: A | Discharge status: Home / Self Care | Payer: Managed Care, Other (non HMO) | Source: Ambulatory Visit | Attending: Cardiology | Admitting: Cardiology

## 2016-03-19 DIAGNOSIS — I509 Heart failure, unspecified: Secondary | ICD-10-CM | POA: Diagnosis not present

## 2016-03-20 ENCOUNTER — Inpatient Hospital Stay (HOSPITAL_COMMUNITY)
Admission: EM | Admit: 2016-03-20 | Discharge: 2016-03-22 | DRG: 872 | Disposition: A | Discharge status: Home / Self Care | Payer: Managed Care, Other (non HMO) | Attending: Internal Medicine | Admitting: Internal Medicine

## 2016-03-20 ENCOUNTER — Encounter (HOSPITAL_COMMUNITY): Payer: Self-pay | Admitting: Emergency Medicine

## 2016-03-20 DIAGNOSIS — D638 Anemia in other chronic diseases classified elsewhere: Secondary | ICD-10-CM | POA: Diagnosis present

## 2016-03-20 DIAGNOSIS — Z87440 Personal history of urinary (tract) infections: Secondary | ICD-10-CM

## 2016-03-20 DIAGNOSIS — Z7984 Long term (current) use of oral hypoglycemic drugs: Secondary | ICD-10-CM

## 2016-03-20 DIAGNOSIS — Z841 Family history of disorders of kidney and ureter: Secondary | ICD-10-CM

## 2016-03-20 DIAGNOSIS — I10 Essential (primary) hypertension: Secondary | ICD-10-CM | POA: Diagnosis present

## 2016-03-20 DIAGNOSIS — E871 Hypo-osmolality and hyponatremia: Secondary | ICD-10-CM | POA: Diagnosis present

## 2016-03-20 DIAGNOSIS — F329 Major depressive disorder, single episode, unspecified: Secondary | ICD-10-CM | POA: Diagnosis present

## 2016-03-20 DIAGNOSIS — I110 Hypertensive heart disease with heart failure: Secondary | ICD-10-CM | POA: Diagnosis present

## 2016-03-20 DIAGNOSIS — N12 Tubulo-interstitial nephritis, not specified as acute or chronic: Secondary | ICD-10-CM | POA: Diagnosis present

## 2016-03-20 DIAGNOSIS — E119 Type 2 diabetes mellitus without complications: Secondary | ICD-10-CM | POA: Diagnosis present

## 2016-03-20 DIAGNOSIS — I429 Cardiomyopathy, unspecified: Secondary | ICD-10-CM | POA: Diagnosis present

## 2016-03-20 DIAGNOSIS — A419 Sepsis, unspecified organism: Secondary | ICD-10-CM | POA: Diagnosis not present

## 2016-03-20 DIAGNOSIS — D649 Anemia, unspecified: Secondary | ICD-10-CM | POA: Diagnosis present

## 2016-03-20 DIAGNOSIS — E039 Hypothyroidism, unspecified: Secondary | ICD-10-CM | POA: Diagnosis present

## 2016-03-20 DIAGNOSIS — Z833 Family history of diabetes mellitus: Secondary | ICD-10-CM

## 2016-03-20 DIAGNOSIS — R509 Fever, unspecified: Secondary | ICD-10-CM | POA: Diagnosis not present

## 2016-03-20 DIAGNOSIS — N179 Acute kidney failure, unspecified: Secondary | ICD-10-CM | POA: Diagnosis present

## 2016-03-20 DIAGNOSIS — Z8249 Family history of ischemic heart disease and other diseases of the circulatory system: Secondary | ICD-10-CM

## 2016-03-20 DIAGNOSIS — Z823 Family history of stroke: Secondary | ICD-10-CM

## 2016-03-20 DIAGNOSIS — I5022 Chronic systolic (congestive) heart failure: Secondary | ICD-10-CM | POA: Diagnosis present

## 2016-03-20 DIAGNOSIS — E785 Hyperlipidemia, unspecified: Secondary | ICD-10-CM | POA: Diagnosis present

## 2016-03-20 DIAGNOSIS — M797 Fibromyalgia: Secondary | ICD-10-CM | POA: Diagnosis present

## 2016-03-20 HISTORY — DX: Major depressive disorder, single episode, unspecified: F32.9

## 2016-03-20 HISTORY — DX: Anemia, unspecified: D64.9

## 2016-03-20 HISTORY — DX: Heart failure, unspecified: I50.9

## 2016-03-20 HISTORY — DX: Depression, unspecified: F32.A

## 2016-03-20 LAB — CBC WITH DIFFERENTIAL/PLATELET
Basophils Absolute: 0 K/uL (ref 0.0–0.1)
Basophils Relative: 0 %
Eosinophils Absolute: 0.1 K/uL (ref 0.0–0.7)
Eosinophils Relative: 1 %
HCT: 34.8 % — ABNORMAL LOW (ref 36.0–46.0)
Hemoglobin: 11.9 g/dL — ABNORMAL LOW (ref 12.0–15.0)
Lymphocytes Relative: 4 %
Lymphs Abs: 0.4 K/uL — ABNORMAL LOW (ref 0.7–4.0)
MCH: 30.3 pg (ref 26.0–34.0)
MCHC: 34.2 g/dL (ref 30.0–36.0)
MCV: 88.5 fL (ref 78.0–100.0)
Monocytes Absolute: 0.2 K/uL (ref 0.1–1.0)
Monocytes Relative: 2 %
Neutro Abs: 10.4 K/uL — ABNORMAL HIGH (ref 1.7–7.7)
Neutrophils Relative %: 93 %
Platelets: 268 K/uL (ref 150–400)
RBC: 3.93 MIL/uL (ref 3.87–5.11)
RDW: 13.6 % (ref 11.5–15.5)
WBC: 11.1 K/uL — ABNORMAL HIGH (ref 4.0–10.5)

## 2016-03-20 LAB — URINALYSIS, ROUTINE W REFLEX MICROSCOPIC
Bilirubin Urine: NEGATIVE
Glucose, UA: NEGATIVE mg/dL
Hgb urine dipstick: NEGATIVE
Ketones, ur: NEGATIVE mg/dL
Nitrite: NEGATIVE
Protein, ur: NEGATIVE mg/dL
Specific Gravity, Urine: 1.016 (ref 1.005–1.030)
pH: 5.5 (ref 5.0–8.0)

## 2016-03-20 LAB — I-STAT CG4 LACTIC ACID, ED: Lactic Acid, Venous: 2.21 mmol/L (ref 0.5–2.0)

## 2016-03-20 LAB — URINE MICROSCOPIC-ADD ON

## 2016-03-20 MED ORDER — SODIUM CHLORIDE 0.9 % IV BOLUS (SEPSIS)
250.0000 mL | Freq: Once | INTRAVENOUS | Status: AC
  Administered 2016-03-21: 250 mL via INTRAVENOUS

## 2016-03-20 MED ORDER — DEXTROSE 5 % IV SOLN
2.0000 g | Freq: Once | INTRAVENOUS | Status: AC
  Administered 2016-03-21: 2 g via INTRAVENOUS
  Filled 2016-03-20: qty 2

## 2016-03-20 MED ORDER — ACETAMINOPHEN 325 MG PO TABS
650.0000 mg | ORAL_TABLET | Freq: Once | ORAL | Status: AC
  Administered 2016-03-20: 650 mg via ORAL
  Filled 2016-03-20: qty 2

## 2016-03-20 NOTE — ED Notes (Signed)
Pt has taken "a number of antibiotics" for a UTI that has not fully resolved. Pt states she saw a urologist who did a urine culture that has not yet resulted. Pt states that around 1300 today she began feeling chills, fever, and neck pain. Pt is able to move her head in all directions. Pt has not taken anything for her fever. Pt also has a history of CHF

## 2016-03-20 NOTE — ED Provider Notes (Signed)
CSN: [id number]     Arrival date & time 03/20/16  2026 History  By signing my name below, I, Rowan Blase, attest that this documentation has been prepared under the direction and in the presence of non-physician practitioner, Donnald Garre PA-C. Electronically Signed: Rowan Blase, Scribe. 03/20/2016. 11:25 PM.    Chief Complaint  Patient presents with  . Neck Pain  . Flank Pain  . Fever    The history is provided by the patient. No language interpreter was used.   HPI Comments:  Robin Owen is a 55 y.o. female with PMhx of DM, HTN, HLD and CHF who presents to the Emergency Department complaining of sudden onset fever and chills beginning ~10 hours ago. Pt reports associated chronic dysuria, new headache and worsening BL lower back pain. No alleviating factors noted or treatments attempted PTA. She states she has been taking multiple antibiotics for persistent UTI; she saw a urologist at [facility] who took a urine culture and started pt on low dose antibiotics yesterday. She called the urology office and was referred to the ED tonight. Denies neck pain.  Cardiologist: Dr. Algernon Huxley   Past Medical History  Diagnosis Date  . Diabetes mellitus without complication ([place] [facility])   . Hypertension   . Fibromyalgia   . Hypothyroidism   . Hyperlipidemia   . CHF (congestive heart failure) ([place])   . Depression   . Anemia    Past Surgical History  Procedure Laterality Date  . Endometrial ablation  2012  . Cardiac catheterization N/A 09/16/2015    Procedure: Right/Left Heart Cath and Coronary Angiography;  Surgeon: Peter M Martinique, MD;  Location: [facility];  Service: Cardiovascular;  Laterality: N/A;   Family History  Problem Relation Age of Onset  . Pulmonary fibrosis Mother   . Hypertension Mother   . Atrial fibrillation Mother   . Hypertension Father   . Diabetes Mellitus II Father   . Kidney disease Father   . Diabetes Mellitus II Brother   . Hypertension  Brother   . Atrial fibrillation Maternal Grandmother   . Hypertension Maternal Grandmother   . Stroke Maternal Grandmother    Social History  Substance Use Topics  . Smoking status: Never Smoker   . Smokeless tobacco: Never Used  . Alcohol Use: No   OB History    No data available     Review of Systems  Constitutional: Positive for fever and chills.  Genitourinary: Positive for dysuria.  Musculoskeletal: Positive for back pain. Negative for neck pain.  Neurological: Positive for headaches.  All other systems reviewed and are negative.   Allergies  Review of patient's allergies indicates no active allergies.  Home Medications   Prior to Admission medications   Medication Sig Start Date End Date Taking? Authorizing Provider  aspirin 81 MG tablet Take 81 mg by mouth daily.   Yes Historical Provider, MD  atorvastatin (LIPITOR) 10 MG tablet Take 10 mg by mouth daily.   Yes Historical Provider, MD  carvedilol (COREG) 12.5 MG tablet Take 1 tablet (12.5 mg total) by mouth 2 (two) times daily with a meal. 03/02/16  Yes Larey Dresser, MD  cyclobenzaprine (FLEXERIL) 10 MG tablet Take 10 mg by mouth 2 (two) times daily as needed for muscle spasms (pt takes qhs and PRN).    Yes Historical Provider, MD  FLUoxetine (PROZAC) 20 MG tablet Take 20 mg by mouth daily.   Yes Historical Provider, MD  furosemide (LASIX) 40 MG tablet Take 1  tablet (40 mg total) by mouth daily. 02/04/16  Yes Larey Dresser, MD  gabapentin (NEURONTIN) 600 MG tablet Take 300-600 mg by mouth 3 (three) times daily as needed (for fibromyalgia). Take 1/2 tablet (358m) by mouth in the morning, and afternonn, and 1 tablet (6041m at bedtime   Yes Historical Provider, MD  glimepiride (AMARYL) 2 MG tablet Take 2 mg by mouth daily as needed (on average pt takes 3x weekly). Take if BG >160 09/02/15  Yes Historical Provider, MD  Iron-FA-B Cmp-C-Biot-Probiotic (FUSION PLUS PO) Take 1 capsule by mouth daily.   Yes Historical Provider,  MD  ivabradine (CORLANOR) 5 MG TABS tablet Take 0.5 tablets (2.5 mg total) by mouth 2 (two) times daily with a meal. 02/08/16  Yes Amy D Clegg, NP  levothyroxine (SYNTHROID, LEVOTHROID) 88 MCG tablet Take 88 mcg by mouth daily before breakfast.   Yes Historical Provider, MD  Liraglutide (VICTOZA) 18 MG/3ML SOPN Inject 1.8 mg into the skin daily.   Yes Historical Provider, MD  metFORMIN (GLUCOPHAGE) 1000 MG tablet Take 1 tablet (1,000 mg total) by mouth 2 (two) times daily with a meal. 09/17/15  Yes SaDebbe OdeaMD  Methylcellulose, Laxative, (MIRAFIBER) 500 MG TABS Take 500 mg by mouth daily.   Yes Historical Provider, MD  Multiple Vitamin (MULTIVITAMIN WITH MINERALS) TABS tablet Take 1 tablet by mouth daily.   Yes Historical Provider, MD  nitrofurantoin, macrocrystal-monohydrate, (MACROBID) 100 MG capsule Take 100 mg by mouth at bedtime.   Yes Historical Provider, MD  sacubitril-valsartan (ENTRESTO) 24-26 MG Take 1 tablet by mouth 2 (two) times daily. 02/04/16  Yes DaLarey DresserMD  spironolactone (ALDACTONE) 25 MG tablet Take 0.5 tablets (12.5 mg total) by mouth daily. 03/02/16  Yes DaLarey DresserMD  traMADol (ULTRAM) 50 MG tablet Take 50 mg by mouth every 6 (six) hours as needed for moderate pain (pt takes qhs and PRN).    Yes Historical Provider, MD  NOVOTWIST 32G X 5 MM MISC Use as directed with Victozia. 08/19/15   Historical Provider, MD  ONGlory RosebushERIO test strip Check blood glucose once daily 08/19/15   Historical Provider, MD   BP 125/79 mmHg  Pulse 105  Temp(Src) 101.1 F (38.4 C) (Oral)  Resp 18  Ht '5\' 5"'  (1.651 m)  Wt 160 lb (72.576 kg)  BMI 26.63 kg/m2  SpO2 96%   Physical Exam  Constitutional: She is oriented to person, place, and time. She appears well-developed and well-nourished. No distress.  HENT:  Head: Normocephalic and atraumatic.  Mouth/Throat: No oropharyngeal exudate.  Eyes: Conjunctivae and EOM are normal. Pupils are equal, round, and reactive to light. Right  eye exhibits no discharge. Left eye exhibits no discharge. No scleral icterus.  Neck:  No meningismus   Cardiovascular: Normal rate, regular rhythm, normal heart sounds and intact distal pulses.  Exam reveals no gallop and no friction rub.   No murmur heard. Pulmonary/Chest: Effort normal and breath sounds normal. No respiratory distress. She has no wheezes. She has no rales. She exhibits no tenderness.  Abdominal: Soft. She exhibits no distension. There is tenderness. There is no guarding.  No CVA tenderness; mild TTP over suprapubic area  Musculoskeletal: Normal range of motion. She exhibits no edema.  Neurological: She is alert and oriented to person, place, and time.  Skin: Skin is warm and dry. No rash noted. She is not diaphoretic. No erythema. No pallor.  Psychiatric: She has a normal mood and affect. Her behavior is normal.  Nursing note and vitals reviewed.   ED Course  Procedures   CRITICAL CARE Performed by: Carlos Levering   Total critical care time: 40 minutes  Critical care time was exclusive of separately billable procedures and treating other patients.  Critical care was necessary to treat or prevent imminent or life-threatening deterioration.  Critical care was time spent personally by me on the following activities: development of treatment plan with patient and/or surrogate as well as nursing, discussions with consultants, evaluation of patient's response to treatment, examination of patient, obtaining history from patient or surrogate, ordering and performing treatments and interventions, ordering and review of laboratory studies, ordering and review of radiographic studies, pulse oximetry and re-evaluation of patient's condition.  DIAGNOSTIC STUDIES:  Oxygen Saturation is 96% on RA, normal by my interpretation.    COORDINATION OF CARE:  11:22 PM Will order blood work and administer Tylenol. Discussed treatment plan with pt at bedside and pt agreed to  plan.  Labs Review Labs Reviewed  URINALYSIS, ROUTINE W REFLEX MICROSCOPIC (NOT AT [facility]) - Abnormal; Notable for the following:    APPearance CLOUDY (*)    Leukocytes, UA MODERATE (*)    All other components within normal limits  COMPREHENSIVE METABOLIC PANEL - Abnormal; Notable for the following:    Sodium 131 (*)    Chloride 96 (*)    Glucose, Bld 318 (*)    Creatinine, Ser 1.11 (*)    Calcium 8.8 (*)    GFR calc non Af Amer 56 (*)    All other components within normal limits  CBC WITH DIFFERENTIAL/PLATELET - Abnormal; Notable for the following:    WBC 11.1 (*)    Hemoglobin 11.9 (*)    HCT 34.8 (*)    Neutro Abs 10.4 (*)    Lymphs Abs 0.4 (*)    All other components within normal limits  URINE MICROSCOPIC-ADD ON - Abnormal; Notable for the following:    Squamous Epithelial / LPF 6-30 (*)    Bacteria, UA MANY (*)    All other components within normal limits  CBC - Abnormal; Notable for the following:    RBC 3.52 (*)    Hemoglobin 10.8 (*)    HCT 31.7 (*)    All other components within normal limits  I-STAT CG4 LACTIC ACID, ED - Abnormal; Notable for the following:    Lactic Acid, Venous 2.21 (*)    All other components within normal limits  CULTURE, BLOOD (ROUTINE X 2)  CULTURE, BLOOD (ROUTINE X 2)  URINE CULTURE  HEMOGLOBIN A1C  CREATININE, SERUM  OSMOLALITY  OSMOLALITY, URINE  I-STAT CG4 LACTIC ACID, ED  I-STAT CG4 LACTIC ACID, ED    Imaging Review Ct Abdomen Pelvis W Contrast  03/21/2016  CLINICAL DATA:  Fever and chills. Urinary tract infection. Clinical concern for perinephric abscess. EXAM: CT ABDOMEN AND PELVIS WITH CONTRAST TECHNIQUE: Multidetector CT imaging of the abdomen and pelvis was performed using the standard protocol following bolus administration of intravenous contrast. CONTRAST:  162m ISOVUE-300 IOPAMIDOL (ISOVUE-300) INJECTION 61% COMPARISON:  None. FINDINGS: Lower chest: Mild dependent atelectasis. No consolidation. No pleural effusion. Liver:  No focal lesion. Focal fatty infiltration adjacent to the porta hepatis. Hepatobiliary: Gallbladder is decompressed. No calcified stone. No biliary dilatation. Pancreas: No ductal dilatation or inflammation. Spleen: Prominent in size 14 cm AP.  No focal lesion. Adrenal glands: No nodule. Kidneys: Heterogeneous enhancement of the posterior upper left kidney, best appreciated on delayed phase. Minimal nonspecific perinephric stranding, right greater than left.  No intrarenal or perirenal fluid collection or abscess. No hydronephrosis. No focal renal lesion. Symmetric excretion on delayed phase imaging. Ureters are decompressed. Stomach/Bowel: Stomach distended with ingested contents. There are no dilated or thickened small bowel loops. Moderate to large stool burden with diffuse colonic tortuosity is suggesting constipation. No wall thickening. The appendix is normal. Vascular/Lymphatic: No retroperitoneal adenopathy. Abdominal aorta is normal in caliber. Reproductive: Lobular uterine contours with probable fibroids, scattered calcifications. Ovaries symmetric in size. No adnexal mass. Bladder: Physiologically distended. No wall thickening. No perivesicular inflammation. Other: No free air, free fluid, or intra-abdominal fluid collection. Tiny fat containing umbilical hernia. Subcutaneous soft tissue induration left greater than right anterior abdominal wall, likely injection related. Musculoskeletal: There are no acute or suspicious osseous abnormalities. Degenerative disc disease L5-S1. IMPRESSION: 1. Heterogeneous left renal parenchyma, may reflect pyelonephritis. No perinephric or intrarenal abscess. 2. Moderate to large diffuse stool burden in colonic tortuosity, it would Electronically Signed   By: Jeb Levering M.D.   On: 03/21/2016 02:45   I have personally reviewed and evaluated these images and lab results as part of my medical decision-making.   EKG Interpretation   Date/Time:  Wednesday Mar 21 2016 00:12:44 EDT Ventricular Rate:  109 PR Interval:  131 QRS Duration: 93 QT Interval:  316 QTC Calculation: 425 R Axis:   -30 Text Interpretation:  Sinus tachycardia Left axis deviation Abnormal  R-wave progression, late transition T wave inversions I and aVL Confirmed  by HORTON  MD, Loma Sousa ([id number]) on 03/21/2016 12:29:26 AM      MDM   Final diagnoses:  Pyelonephritis    55 year old female with a past medical history of CHF, DM presents the ED today complaining of dysuria and fever. Patient has been having recurrent UTIs and has been on several different antibiotics. She saw urology yesterday and is currently taking nitrofurantoin. Today she developed a fever. On presentation to ED her initial temp was 101.2 and her heart rate was tachycardic to 105. UA positive for infection. Sepsis criteria met. Code sepsis was called. Initial lactate is 2.21. Mild leukocytosis present, WBC 11.1. Do not feel that she is in severe sepsis. She was given 250 mL of fluids. Did not use 30 mL/kg as she has CHF with an ejection fraction of 35% and she is not in severe sepsis requiring intense fluid resuscitation. IV Rocephin given per pharmacy recommendations. She was given Tylenol which brought down her temperature 99.9. Heart rate now 96. CT obtained to rule out perinephric abscess as she has had recurrent UTIs. CT negative for this abnormality does show changes reflecting likely pyelonephritis. Patient upon reexam reports that she is feeling much better. However, given that she has failed several outpatient therapies and had positive sepsis criteria recommend admission for continued IV antibiotics and observation. Patient agrees with this plan. Spoke with hospitalist, Dr. Loleta Books who agrees and will admit patient to his service. Patient is currently hemodynamically stable and awaiting bed placement.  Case discussed with Dr. Dina Rich who agrees with treatment plan.  I personally performed the services described  in this documentation, which was scribed in my presence. The recorded information has been reviewed and is accurate.     Dondra Spry Hickman, PA-C 03/24/16 [place], MD 04/01/16 2250

## 2016-03-20 NOTE — ED Notes (Signed)
Writer notified EDP of abnormal istat lactic result.

## 2016-03-21 ENCOUNTER — Emergency Department (HOSPITAL_COMMUNITY): Payer: Managed Care, Other (non HMO)

## 2016-03-21 ENCOUNTER — Telehealth (HOSPITAL_COMMUNITY): Payer: Self-pay | Admitting: *Deleted

## 2016-03-21 ENCOUNTER — Encounter (HOSPITAL_COMMUNITY): Payer: Managed Care, Other (non HMO)

## 2016-03-21 ENCOUNTER — Encounter (HOSPITAL_COMMUNITY): Payer: Self-pay

## 2016-03-21 DIAGNOSIS — I10 Essential (primary) hypertension: Secondary | ICD-10-CM | POA: Diagnosis not present

## 2016-03-21 DIAGNOSIS — E119 Type 2 diabetes mellitus without complications: Secondary | ICD-10-CM | POA: Diagnosis present

## 2016-03-21 DIAGNOSIS — Z833 Family history of diabetes mellitus: Secondary | ICD-10-CM | POA: Diagnosis not present

## 2016-03-21 DIAGNOSIS — E039 Hypothyroidism, unspecified: Secondary | ICD-10-CM | POA: Diagnosis present

## 2016-03-21 DIAGNOSIS — N10 Acute tubulo-interstitial nephritis: Secondary | ICD-10-CM | POA: Diagnosis not present

## 2016-03-21 DIAGNOSIS — Z8249 Family history of ischemic heart disease and other diseases of the circulatory system: Secondary | ICD-10-CM | POA: Diagnosis not present

## 2016-03-21 DIAGNOSIS — Z841 Family history of disorders of kidney and ureter: Secondary | ICD-10-CM | POA: Diagnosis not present

## 2016-03-21 DIAGNOSIS — I110 Hypertensive heart disease with heart failure: Secondary | ICD-10-CM | POA: Diagnosis present

## 2016-03-21 DIAGNOSIS — E785 Hyperlipidemia, unspecified: Secondary | ICD-10-CM | POA: Diagnosis present

## 2016-03-21 DIAGNOSIS — E038 Other specified hypothyroidism: Secondary | ICD-10-CM

## 2016-03-21 DIAGNOSIS — M797 Fibromyalgia: Secondary | ICD-10-CM | POA: Diagnosis present

## 2016-03-21 DIAGNOSIS — N12 Tubulo-interstitial nephritis, not specified as acute or chronic: Secondary | ICD-10-CM | POA: Diagnosis present

## 2016-03-21 DIAGNOSIS — I5022 Chronic systolic (congestive) heart failure: Secondary | ICD-10-CM | POA: Diagnosis present

## 2016-03-21 DIAGNOSIS — A419 Sepsis, unspecified organism: Secondary | ICD-10-CM | POA: Diagnosis present

## 2016-03-21 DIAGNOSIS — F329 Major depressive disorder, single episode, unspecified: Secondary | ICD-10-CM | POA: Diagnosis present

## 2016-03-21 DIAGNOSIS — R509 Fever, unspecified: Secondary | ICD-10-CM | POA: Diagnosis present

## 2016-03-21 DIAGNOSIS — D649 Anemia, unspecified: Secondary | ICD-10-CM

## 2016-03-21 DIAGNOSIS — Z823 Family history of stroke: Secondary | ICD-10-CM | POA: Diagnosis not present

## 2016-03-21 DIAGNOSIS — E871 Hypo-osmolality and hyponatremia: Secondary | ICD-10-CM | POA: Diagnosis present

## 2016-03-21 DIAGNOSIS — Z7984 Long term (current) use of oral hypoglycemic drugs: Secondary | ICD-10-CM | POA: Diagnosis not present

## 2016-03-21 DIAGNOSIS — D638 Anemia in other chronic diseases classified elsewhere: Secondary | ICD-10-CM | POA: Diagnosis present

## 2016-03-21 DIAGNOSIS — N179 Acute kidney failure, unspecified: Secondary | ICD-10-CM | POA: Diagnosis present

## 2016-03-21 DIAGNOSIS — I429 Cardiomyopathy, unspecified: Secondary | ICD-10-CM | POA: Diagnosis present

## 2016-03-21 DIAGNOSIS — Z87440 Personal history of urinary (tract) infections: Secondary | ICD-10-CM | POA: Diagnosis not present

## 2016-03-21 LAB — BASIC METABOLIC PANEL
Anion gap: 10 (ref 5–15)
BUN: 13 mg/dL (ref 6–20)
CO2: 26 mmol/L (ref 22–32)
Calcium: 8.5 mg/dL — ABNORMAL LOW (ref 8.9–10.3)
Chloride: 101 mmol/L (ref 101–111)
Creatinine, Ser: 0.93 mg/dL (ref 0.44–1.00)
GFR calc Af Amer: >60 mL/min (ref >60)
GFR calc non Af Amer: >60 mL/min (ref >60)
Glucose, Bld: 209 mg/dL — ABNORMAL HIGH (ref 65–99)
Potassium: 3.7 mmol/L (ref 3.5–5.1)
Sodium: 137 mmol/L (ref 135–145)

## 2016-03-21 LAB — CBC
HCT: 31.7 % — ABNORMAL LOW (ref 36.0–46.0)
Hemoglobin: 10.8 g/dL — ABNORMAL LOW (ref 12.0–15.0)
MCH: 30.7 pg (ref 26.0–34.0)
MCHC: 34.1 g/dL (ref 30.0–36.0)
MCV: 90.1 fL (ref 78.0–100.0)
Platelets: 238 10*3/uL (ref 150–400)
RBC: 3.52 MIL/uL — ABNORMAL LOW (ref 3.87–5.11)
RDW: 14 % (ref 11.5–15.5)
WBC: 7.8 10*3/uL (ref 4.0–10.5)

## 2016-03-21 LAB — I-STAT CG4 LACTIC ACID, ED: Lactic Acid, Venous: 1.66 mmol/L (ref 0.5–2.0)

## 2016-03-21 LAB — COMPREHENSIVE METABOLIC PANEL WITH GFR
ALT: 20 U/L (ref 14–54)
AST: 21 U/L (ref 15–41)
Albumin: 4.1 g/dL (ref 3.5–5.0)
Alkaline Phosphatase: 90 U/L (ref 38–126)
Anion gap: 9 (ref 5–15)
BUN: 15 mg/dL (ref 6–20)
CO2: 26 mmol/L (ref 22–32)
Calcium: 8.8 mg/dL — ABNORMAL LOW (ref 8.9–10.3)
Chloride: 96 mmol/L — ABNORMAL LOW (ref 101–111)
Creatinine, Ser: 1.11 mg/dL — ABNORMAL HIGH (ref 0.44–1.00)
GFR calc Af Amer: >60 mL/min
GFR calc non Af Amer: 56 mL/min — ABNORMAL LOW
Glucose, Bld: 318 mg/dL — ABNORMAL HIGH (ref 65–99)
Potassium: 3.8 mmol/L (ref 3.5–5.1)
Sodium: 131 mmol/L — ABNORMAL LOW (ref 135–145)
Total Bilirubin: 0.4 mg/dL (ref 0.3–1.2)
Total Protein: 7.4 g/dL (ref 6.5–8.1)

## 2016-03-21 LAB — GLUCOSE, CAPILLARY
Glucose-Capillary: 234 mg/dL — ABNORMAL HIGH (ref 65–99)
Glucose-Capillary: 300 mg/dL — ABNORMAL HIGH (ref 65–99)
Glucose-Capillary: 110 mg/dL — ABNORMAL HIGH (ref 65–99)
Glucose-Capillary: 165 mg/dL — ABNORMAL HIGH (ref 65–99)

## 2016-03-21 LAB — OSMOLALITY, URINE: Osmolality, Ur: 428 mOsm/kg (ref 300–900)

## 2016-03-21 LAB — CREATININE, SERUM
Creatinine, Ser: 0.91 mg/dL (ref 0.44–1.00)
GFR calc Af Amer: >60 mL/min (ref >60)
GFR calc non Af Amer: >60 mL/min (ref >60)

## 2016-03-21 LAB — OSMOLALITY: Osmolality: 287 mOsm/kg (ref 275–295)

## 2016-03-21 MED ORDER — TRAMADOL HCL 50 MG PO TABS: 50.0000 mg | ORAL_TABLET | Freq: Four times a day (QID) | ORAL | Status: DC | PRN

## 2016-03-21 MED ORDER — ACETAMINOPHEN 650 MG RE SUPP: 650.0000 mg | Freq: Four times a day (QID) | RECTAL | Status: DC | PRN

## 2016-03-21 MED ORDER — ASPIRIN 81 MG PO CHEW
81.0000 mg | CHEWABLE_TABLET | Freq: Every day | ORAL | Status: DC
  Administered 2016-03-21 – 2016-03-22 (×2): 81 mg via ORAL
  Filled 2016-03-21 (×2): qty 1

## 2016-03-21 MED ORDER — MORPHINE SULFATE (PF) 4 MG/ML IV SOLN
4.0000 mg | Freq: Once | INTRAVENOUS | Status: DC
  Filled 2016-03-21: qty 1

## 2016-03-21 MED ORDER — SPIRONOLACTONE 25 MG PO TABS
12.5000 mg | ORAL_TABLET | Freq: Every day | ORAL | Status: DC
  Administered 2016-03-21 – 2016-03-22 (×2): 12.5 mg via ORAL
  Filled 2016-03-21 (×3): qty 1

## 2016-03-21 MED ORDER — IVABRADINE HCL 5 MG PO TABS
2.5000 mg | ORAL_TABLET | Freq: Two times a day (BID) | ORAL | Status: DC
  Administered 2016-03-21 – 2016-03-22 (×3): 2.5 mg via ORAL
  Filled 2016-03-21 (×5): qty 1

## 2016-03-21 MED ORDER — SACUBITRIL-VALSARTAN 24-26 MG PO TABS
1.0000 | ORAL_TABLET | Freq: Two times a day (BID) | ORAL | Status: DC
  Administered 2016-03-21 – 2016-03-22 (×3): 1 via ORAL
  Filled 2016-03-21 (×4): qty 1

## 2016-03-21 MED ORDER — CYCLOBENZAPRINE HCL 10 MG PO TABS: 10.0000 mg | ORAL_TABLET | Freq: Two times a day (BID) | ORAL | Status: DC | PRN

## 2016-03-21 MED ORDER — CARVEDILOL 6.25 MG PO TABS
6.2500 mg | ORAL_TABLET | Freq: Two times a day (BID) | ORAL | Status: DC
  Administered 2016-03-21 – 2016-03-22 (×2): 6.25 mg via ORAL
  Filled 2016-03-21 (×2): qty 1

## 2016-03-21 MED ORDER — MORPHINE SULFATE (PF) 2 MG/ML IV SOLN: 2.0000 mg | INTRAVENOUS | Status: DC | PRN

## 2016-03-21 MED ORDER — ENOXAPARIN SODIUM 40 MG/0.4ML ~~LOC~~ SOLN
40.0000 mg | SUBCUTANEOUS | Status: DC
  Administered 2016-03-21 – 2016-03-22 (×2): 40 mg via SUBCUTANEOUS
  Filled 2016-03-21 (×2): qty 0.4

## 2016-03-21 MED ORDER — DOCUSATE SODIUM 100 MG PO CAPS
100.0000 mg | ORAL_CAPSULE | Freq: Two times a day (BID) | ORAL | Status: DC
  Administered 2016-03-21 – 2016-03-22 (×3): 100 mg via ORAL
  Filled 2016-03-21 (×3): qty 1

## 2016-03-21 MED ORDER — GABAPENTIN 300 MG PO CAPS: 300.0000 mg | ORAL_CAPSULE | Freq: Three times a day (TID) | ORAL | Status: DC | PRN

## 2016-03-21 MED ORDER — INSULIN ASPART 100 UNIT/ML ~~LOC~~ SOLN
0.0000 [IU] | Freq: Three times a day (TID) | SUBCUTANEOUS | Status: DC
  Administered 2016-03-21: 8 [IU] via SUBCUTANEOUS
  Administered 2016-03-21 – 2016-03-22 (×2): 5 [IU] via SUBCUTANEOUS

## 2016-03-21 MED ORDER — ACETAMINOPHEN 500 MG PO TABS
1000.0000 mg | ORAL_TABLET | Freq: Once | ORAL | Status: AC
  Administered 2016-03-21: 1000 mg via ORAL
  Filled 2016-03-21: qty 2

## 2016-03-21 MED ORDER — ONDANSETRON HCL 4 MG PO TABS: 4.0000 mg | ORAL_TABLET | Freq: Four times a day (QID) | ORAL | Status: DC | PRN

## 2016-03-21 MED ORDER — ONDANSETRON HCL 4 MG/2ML IJ SOLN: 4.0000 mg | Freq: Four times a day (QID) | INTRAMUSCULAR | Status: DC | PRN

## 2016-03-21 MED ORDER — INSULIN GLARGINE 100 UNIT/ML ~~LOC~~ SOLN
8.0000 [IU] | Freq: Every day | SUBCUTANEOUS | Status: DC
  Administered 2016-03-21: 8 [IU] via SUBCUTANEOUS
  Filled 2016-03-21: qty 0.08

## 2016-03-21 MED ORDER — ACETAMINOPHEN 325 MG PO TABS: 650.0000 mg | ORAL_TABLET | Freq: Four times a day (QID) | ORAL | Status: DC | PRN

## 2016-03-21 MED ORDER — DEXTROSE 5 % IV SOLN
2.0000 g | Freq: Every day | INTRAVENOUS | Status: DC
  Administered 2016-03-21: 2 g via INTRAVENOUS
  Filled 2016-03-21: qty 2

## 2016-03-21 MED ORDER — SENNOSIDES-DOCUSATE SODIUM 8.6-50 MG PO TABS: 1.0000 | ORAL_TABLET | Freq: Every evening | ORAL | Status: DC | PRN

## 2016-03-21 MED ORDER — ATORVASTATIN CALCIUM 10 MG PO TABS
10.0000 mg | ORAL_TABLET | Freq: Every day | ORAL | Status: DC
  Administered 2016-03-21: 10 mg via ORAL
  Filled 2016-03-21: qty 1

## 2016-03-21 MED ORDER — LEVOTHYROXINE SODIUM 88 MCG PO TABS
88.0000 ug | ORAL_TABLET | Freq: Every day | ORAL | Status: DC
  Administered 2016-03-21 – 2016-03-22 (×2): 88 ug via ORAL
  Filled 2016-03-21 (×2): qty 1

## 2016-03-21 MED ORDER — FLUOXETINE HCL 20 MG PO CAPS
20.0000 mg | ORAL_CAPSULE | Freq: Every day | ORAL | Status: DC
  Administered 2016-03-21 – 2016-03-22 (×2): 20 mg via ORAL
  Filled 2016-03-21 (×2): qty 1

## 2016-03-21 MED ORDER — INSULIN ASPART 100 UNIT/ML ~~LOC~~ SOLN: 0.0000 [IU] | Freq: Every day | SUBCUTANEOUS | Status: DC

## 2016-03-21 MED ORDER — IOPAMIDOL (ISOVUE-300) INJECTION 61%
100.0000 mL | Freq: Once | INTRAVENOUS | Status: AC | PRN
  Administered 2016-03-21: 100 mL via INTRAVENOUS

## 2016-03-21 MED ORDER — CARVEDILOL 12.5 MG PO TABS
12.5000 mg | ORAL_TABLET | Freq: Two times a day (BID) | ORAL | Status: DC
  Administered 2016-03-21: 12.5 mg via ORAL
  Filled 2016-03-21: qty 1

## 2016-03-21 NOTE — Plan of Care (Signed)
Problem: Safety: Goal: Ability to remain free from injury will improve Outcome: Completed/Met Date Met:  03/21/16 Pt is aware of safety precautions. Pt agreeable to recommendations. Gait is steady

## 2016-03-21 NOTE — Care Management Note (Signed)
Case Management Note  Patient Details  Name: KHYLIN GUYOT MRN: [id number] Date of Birth: [birth year]  Subjective/Objective: 55 y/o f admitted w/Pyelonephritis. From home.                   Action/Plan:d/c plan home.   Expected Discharge Date:                  Expected Discharge Plan:  Home/Self Care  In-House Referral:     Discharge planning Services  CM Consult  Post Acute Care Choice:    Choice offered to:     DME Arranged:    DME Agency:     HH Arranged:    HH Agency:     Status of Service:  In process, will continue to follow  Medicare Important Message Given:    Date Medicare IM Given:    Medicare IM give by:    Date Additional Medicare IM Given:    Additional Medicare Important Message give by:     If discussed at Long Length of Stay Meetings, dates discussed:    Additional Comments:  Lanier Clam, RN 03/21/2016, 11:34 AM

## 2016-03-21 NOTE — Progress Notes (Signed)
Pharmacy Antibiotic Note  Robin Owen is a 55 y.o. female admitted on 03/20/2016 with sepsis.  Pharmacy has been consulted for Rocephin dosing.  Plan: Rocephin 2Gm IV q24h  Height: 5\' 5"  (165.1 cm) Weight: 160 lb (72.576 kg) IBW/kg (Calculated) : 57  Temp (24hrs), Avg:100.8 F (38.2 C), Min:100.3 F (37.9 C), Max:101.1 F (38.4 C)   Recent Labs Lab 03/15/16 1358 03/20/16 2339 03/20/16 2346  WBC  --  11.1*  --   CREATININE 0.93  --   --   LATICACIDVEN  --   --  2.21*    Estimated Creatinine Clearance: 70.6 mL/min (by C-G formula based on Cr of 0.93).    No Active Allergies  Antimicrobials this admission: 5/23 rocephin >>    >>   Dose adjustments this admission:   Microbiology results:  BCx:   UCx:    Sputum:  MRSA PCR:   Thank you for allowing pharmacy to be a part of this patient's care.  Lorenza Evangelist 03/21/2016 12:07 AM

## 2016-03-21 NOTE — ED Notes (Signed)
Spoke with PA and MD re: admission.  Pt resting quietly.  NAD.  Husband at bedside.

## 2016-03-21 NOTE — Progress Notes (Addendum)
PROGRESS NOTE        PATIENT DETAILS Name: Robin Owen Age: 55 y.o. Sex: female Date of Birth: [birth year] Admit Date: 03/20/2016 Admitting Physician Alberteen Sam, MD ZOX:[id number],WJXBJYN D, MD   Brief Narrative: Patient is a 55 y.o. female with past medical history of chronic systolic heart failure, fibromyalgia and recurrent UTIs admitted with fever, back pain and dysuria. Found to have probable pyelonephritis and admitted to the hospital.  Subjective: Feels better, febrile overnight. Mild back pain persists.  Assessment/Plan: Principal Problem: Sepsis likely secondary to pyelonephritis: Sepsis pathophysiology has resolved, continue intravenous Rocephin, await cultures. CT abdomen negative for abscess.  Active Problems: Mild hyponatremia: Resolved  AKI: Resolved with gentle hydration.  Chronic systolic heart failure (EF 35-40% on 03/15/16): Compensated, resume Coreg and Aldactone, continue Entresto and Ivabradine. Follow closely.  Anemia: Likely secondary to chronic disease-follow  Type 2 diabetes: CBGs on the higher side, continue SSI-and low-dose Lantus. Continue to hold oral hypoglycemic agents.  Hypertension: Stable with Coreg,Entresto and Aldactone  Hypothyroidism: Continue Synthroid  Dyslipidemia: Continue statin  Fibromyalgia: Appears stable-continue fluoxetine, Neurontin and tramadol.  DVT Prophylaxis: Prophylactic Lovenox   Code Status: Full code   Family Communication: None at bedside  Disposition Plan: Remain inpatient-home in 2-3 days  Antimicrobial agents: IV Rocephin 5/24>>  Procedures: None  CONSULTS:  None  Time spent: 25 minutes-Greater than 50% of this time was spent in counseling, explanation of diagnosis, planning of further management, and coordination of care.  MEDICATIONS: Anti-infectives    Start     Dose/Rate Route Frequency Ordered Stop   03/21/16 2200  cefTRIAXone (ROCEPHIN) 2 g in  dextrose 5 % 50 mL IVPB     2 g 100 mL/hr over 30 Minutes Intravenous Daily at bedtime 03/21/16 0029     03/21/16 0000  cefTRIAXone (ROCEPHIN) 2 g in dextrose 5 % 50 mL IVPB     2 g 100 mL/hr over 30 Minutes Intravenous  Once 03/20/16 2359 03/21/16 0052      Scheduled Meds: . aspirin  81 mg Oral Daily  . atorvastatin  10 mg Oral Daily  . carvedilol  6.25 mg Oral BID WC  . cefTRIAXone (ROCEPHIN)  IV  2 g Intravenous QHS  . docusate sodium  100 mg Oral BID  . enoxaparin (LOVENOX) injection  40 mg Subcutaneous Q24H  . FLUoxetine  20 mg Oral Daily  . insulin aspart  0-15 Units Subcutaneous TID WC  . insulin aspart  0-5 Units Subcutaneous QHS  . ivabradine  2.5 mg Oral BID WC  . levothyroxine  88 mcg Oral QAC breakfast  .  morphine injection  4 mg Intravenous Once  . sacubitril-valsartan  1 tablet Oral BID  . spironolactone  12.5 mg Oral Daily   Continuous Infusions:  PRN Meds:.acetaminophen **OR** acetaminophen, cyclobenzaprine, gabapentin, morphine injection, ondansetron **OR** ondansetron (ZOFRAN) IV, senna-docusate, traMADol   PHYSICAL EXAM: Vital signs: Filed Vitals:   03/21/16 0300 03/21/16 0329 03/21/16 0446 03/21/16 1418  BP: 110/60  104/56 110/62  Pulse: 95 96 93 81  Temp:  99.9 F (37.7 C) 98.3 F (36.8 C) 98.7 F (37.1 C)  TempSrc:  Oral Oral Oral  Resp: 20 17 18 18   Height:   5\' 5"  (1.651 m)   Weight:   74.5 kg (164 lb 3.9 oz)   SpO2: 93% 95% 97% 97%  Filed Weights   03/20/16 2259 03/21/16 0104 03/21/16 0446  Weight: 72.576 kg (160 lb) 72.576 kg (160 lb) 74.5 kg (164 lb 3.9 oz)   Body mass index is 27.33 kg/(m^2).   Gen Exam: Awake and alert with clear speech. Not in any distress  Neck: Supple, No JVD.   Chest: B/L Clear.   CVS: S1 S2 Regular, no murmurs.  Abdomen: soft, BS +, non tender, non distended.  Extremities: no edema, lower extremities warm to touch. Neurologic: Non Focal.   Skin: No Rash or lesions   Wounds: N/A.   LABORATORY  DATA: CBC:  Recent Labs Lab 03/20/16 2339 03/21/16 0533  WBC 11.1* 7.8  NEUTROABS 10.4*  --   HGB 11.9* 10.8*  HCT 34.8* 31.7*  MCV 88.5 90.1  PLT 268 238    Basic Metabolic Panel:  Recent Labs Lab 03/15/16 1358 03/20/16 2339 03/21/16 0533 03/21/16 0809  NA 137 131*  --  137  K 4.3 3.8  --  3.7  CL 98 96*  --  101  CO2 31 26  --  26  GLUCOSE 164* 318*  --  209*  BUN 13 15  --  13  CREATININE 0.93 1.11* 0.91 0.93  CALCIUM 9.6 8.8*  --  8.5*    GFR: Estimated Creatinine Clearance: 71.5 mL/min (by C-G formula based on Cr of 0.93).  Liver Function Tests:  Recent Labs Lab 03/20/16 2339  AST 21  ALT 20  ALKPHOS 90  BILITOT 0.4  PROT 7.4  ALBUMIN 4.1   No results for input(s): LIPASE, AMYLASE in the last 168 hours. No results for input(s): AMMONIA in the last 168 hours.  Coagulation Profile: No results for input(s): INR, PROTIME in the last 168 hours.  Cardiac Enzymes: No results for input(s): CKTOTAL, CKMB, CKMBINDEX, TROPONINI in the last 168 hours.  BNP (last 3 results) No results for input(s): PROBNP in the last 8760 hours.  HbA1C: No results for input(s): HGBA1C in the last 72 hours.  CBG:  Recent Labs Lab 03/21/16 0836 03/21/16 1210  GLUCAP 234* 300*    Lipid Profile: No results for input(s): CHOL, HDL, LDLCALC, TRIG, CHOLHDL, LDLDIRECT in the last 72 hours.  Thyroid Function Tests: No results for input(s): TSH, T4TOTAL, FREET4, T3FREE, THYROIDAB in the last 72 hours.  Anemia Panel: No results for input(s): VITAMINB12, FOLATE, FERRITIN, TIBC, IRON, RETICCTPCT in the last 72 hours.  Urine analysis:    Component Value Date/Time   COLORURINE YELLOW 03/20/2016 2259   APPEARANCEUR CLOUDY* 03/20/2016 2259   LABSPEC 1.016 03/20/2016 2259   PHURINE 5.5 03/20/2016 2259   GLUCOSEU NEGATIVE 03/20/2016 2259   HGBUR NEGATIVE 03/20/2016 2259   BILIRUBINUR NEGATIVE 03/20/2016 2259   KETONESUR NEGATIVE 03/20/2016 2259   PROTEINUR NEGATIVE  03/20/2016 2259   UROBILINOGEN 0.2 05/12/2009 1421   NITRITE NEGATIVE 03/20/2016 2259   LEUKOCYTESUR MODERATE* 03/20/2016 2259    Sepsis Labs: Lactic Acid, Venous    Component Value Date/Time   LATICACIDVEN 1.66 03/21/2016 0310    MICROBIOLOGY: Recent Results (from the past 240 hour(s))  Blood Culture (routine x 2)     Status: None (Preliminary result)   Collection Time: 03/20/16 11:30 PM  Result Value Ref Range Status   Specimen Description BLOOD LEFT ANTECUBITAL  Final   Special Requests   Final    BOTTLES DRAWN AEROBIC AND ANAEROBIC 10CC Performed at [facility]    Culture PENDING  Incomplete   Report Status PENDING  Incomplete  Blood Culture (  routine x 2)     Status: None (Preliminary result)   Collection Time: 03/20/16 11:39 PM  Result Value Ref Range Status   Specimen Description   Final    BLOOD LEFT ANTECUBITAL Performed at [facility]    Special Requests BOTTLES DRAWN AEROBIC AND ANAEROBIC 5CC  Final   Culture PENDING  Incomplete   Report Status PENDING  Incomplete    RADIOLOGY STUDIES/RESULTS: Ct Abdomen Pelvis W Contrast  03/21/2016  CLINICAL DATA:  Fever and chills. Urinary tract infection. Clinical concern for perinephric abscess. EXAM: CT ABDOMEN AND PELVIS WITH CONTRAST TECHNIQUE: Multidetector CT imaging of the abdomen and pelvis was performed using the standard protocol following bolus administration of intravenous contrast. CONTRAST:  ISOVUE-300 IOPAMIDOL (ISOVUE-300) INJECTION 61% COMPARISON:  None. FINDINGS: Lower chest: Mild dependent atelectasis. No consolidation. No pleural effusion. Liver: No focal lesion. Focal fatty infiltration adjacent to the porta hepatis. Hepatobiliary: Gallbladder is decompressed. No calcified stone. No biliary dilatation. Pancreas: No ductal dilatation or inflammation. Spleen: Prominent in size 14 cm AP.  No focal lesion. Adrenal glands: No nodule. Kidneys: Heterogeneous enhancement of the posterior upper  left kidney, best appreciated on delayed phase. Minimal nonspecific perinephric stranding, right greater than left. No intrarenal or perirenal fluid collection or abscess. No hydronephrosis. No focal renal lesion. Symmetric excretion on delayed phase imaging. Ureters are decompressed. Stomach/Bowel: Stomach distended with ingested contents. There are no dilated or thickened small bowel loops. Moderate to large stool burden with diffuse colonic tortuosity is suggesting constipation. No wall thickening. The appendix is normal. Vascular/Lymphatic: No retroperitoneal adenopathy. Abdominal aorta is normal in caliber. Reproductive: Lobular uterine contours with probable fibroids, scattered calcifications. Ovaries symmetric in size. No adnexal mass. Bladder: Physiologically distended. No wall thickening. No perivesicular inflammation. Other: No free air, free fluid, or intra-abdominal fluid collection. Tiny fat containing umbilical hernia. Subcutaneous soft tissue induration left greater than right anterior abdominal wall, likely injection related. Musculoskeletal: There are no acute or suspicious osseous abnormalities. Degenerative disc disease L5-S1. IMPRESSION: 1. Heterogeneous left renal parenchyma, may reflect pyelonephritis. No perinephric or intrarenal abscess. 2. Moderate to large diffuse stool burden in colonic tortuosity, it would Electronically Signed   By: Rubye Oaks M.D.   On: 03/21/2016 02:45     LOS: 0 days   Jeoffrey Massed, MD  [facility] Pager:[phone]  If 7PM-7AM, please contact night-coverage [email or web address] Password [email or web address] 03/21/2016, 2:21 PM

## 2016-03-21 NOTE — Progress Notes (Signed)
PHARMACY NOTE -  ceftriaxone  Pharmacy has been assisting with dosing of ceftriaxone for suspected pyelonephritis (per abd CT).  Dosage remains stable at 2gm IV q24h and need for further dosage adjustment appears unlikely at present.  No renal adjustment is needed with ceftriaxone.  Will sign off at this time.  Please reconsult if need further assistance.  Dorna Leitz, PharmD, BCPS 03/21/2016 8:13 AM

## 2016-03-21 NOTE — Plan of Care (Signed)
Problem: Pain Managment: Goal: General experience of comfort will improve Outcome: Completed/Met Date Met:  03/21/16 Pt denies pain

## 2016-03-21 NOTE — H&P (Signed)
History and Physical  Patient Name: Robin Owen     ZOX:[id number]    DOB: Mar 23, 1961    DOA: 03/20/2016 PCP: Junious Silk, MD   Patient coming from: Home  Chief Complaint: Dysuria and fever  HPI: Robin Owen is a 55 y.o. female with a past medical history significant for fibromyalgia, chronic systolic CHF from [facility] EF 35%, NIDDM, hypothyroidism, and HTN who presents with recurrent UTI for three months and now dysuria, fever, and flank pain while on nitrofurantoin.  Starting in February, the patient has had four UTIs in a row.  She has developed dysuria, cloudy urine and right flank pain, gone to her PCP, gotten antibiotics, gotten better, but almost immediately after, started to have symptoms again.  Most recently, she was referred to [facility], saw them this week, had symptoms of dysuria and flank pain again, had a urine sample drawn yesterday, was started on nitrofurantoin.  However, last night after starting nitrofurantoin, she started to have fever at home, bilateral flank pain, and weakness, so she called [facility] who told her to go to the ER.  ED course: -Febrile to 101.68F, tachycardic, saturating well on ambient air -Na 131, K 3.8, Cr 1.11 (baseline 0.9), WBC 11.1K, Hgb 11.9 (baseline) -UA showed full field bacteria and pyuria, sent for culture -Lactate 2.21, so sepsis protocol was started, fluids were administered, cultures drawn of blood and urine, and antibiotics given -CT of the abdomen with contrast showed no abscess, but findings consistent with pyelo of the RIGHT kidney, no hydronephrosis     Review of Systems:  Pt complains of bilateral, right greater than left flank pain, dysuria, cloudy urine. All other systems negative except as just noted or noted in the history of present illness.    Past Medical History  Diagnosis Date  . Diabetes mellitus without complication (HCC)   . Hypertension   . Fibromyalgia   . Hypothyroidism   . Hyperlipidemia   . CHF  (congestive heart failure) (HCC)   . Depression   . Anemia     Past Surgical History  Procedure Laterality Date  . Endometrial ablation  2012  . Cardiac catheterization N/A 09/16/2015    Procedure: Right/Left Heart Cath and Coronary Angiography;  Surgeon: Peter M Swaziland, MD;  Location: [facility];  Service: Cardiovascular;  Laterality: N/A;    Social History: Patient lives with her husband.  She is on disability for fibromyalgia.  The patient walks unassisted, dyspneic with stairs, not on level ground.  Does not smoke, no alcohol.     No Active Allergies  Family history: family history includes Atrial fibrillation in her maternal grandmother and mother; Diabetes Mellitus II in her brother and father; Hypertension in her brother, father, maternal grandmother, and mother; Kidney disease in her father; Pulmonary fibrosis in her mother; Stroke in her maternal grandmother.  Prior to Admission medications   Medication Sig Start Date End Date Taking? Authorizing Provider  aspirin 81 MG tablet Take 81 mg by mouth daily.   Yes Historical Provider, MD  atorvastatin (LIPITOR) 10 MG tablet Take 10 mg by mouth daily.   Yes Historical Provider, MD  carvedilol (COREG) 12.5 MG tablet Take 1 tablet (12.5 mg total) by mouth 2 (two) times daily with a meal. 03/02/16  Yes Laurey Morale, MD  cyclobenzaprine (FLEXERIL) 10 MG tablet Take 10 mg by mouth 2 (two) times daily as needed for muscle spasms (pt takes qhs and PRN).    Yes Historical Provider, MD  FLUoxetine (PROZAC) 20 MG tablet Take 20 mg by mouth daily.   Yes Historical Provider, MD  furosemide (LASIX) 40 MG tablet Take 1 tablet (40 mg total) by mouth daily. 02/04/16  Yes Laurey Morale, MD  gabapentin (NEURONTIN) 600 MG tablet Take 300-600 mg by mouth 3 (three) times daily as needed (for fibromyalgia). Take 1/2 tablet (300mg ) by mouth in the morning, and afternonn, and 1 tablet (600mg ) at bedtime   Yes Historical Provider, MD  glimepiride  (AMARYL) 2 MG tablet Take 2 mg by mouth daily as needed (on average pt takes 3x weekly). Take if BG >160 09/02/15  Yes Historical Provider, MD  Iron-FA-B Cmp-C-Biot-Probiotic (FUSION PLUS PO) Take 1 capsule by mouth daily.   Yes Historical Provider, MD  ivabradine (CORLANOR) 5 MG TABS tablet Take 0.5 tablets (2.5 mg total) by mouth 2 (two) times daily with a meal. 02/08/16  Yes Amy D Clegg, NP  levothyroxine (SYNTHROID, LEVOTHROID) 88 MCG tablet Take 88 mcg by mouth daily before breakfast.   Yes Historical Provider, MD  Liraglutide (VICTOZA) 18 MG/3ML SOPN Inject 1.8 mg into the skin daily.   Yes Historical Provider, MD  metFORMIN (GLUCOPHAGE) 1000 MG tablet Take 1 tablet (1,000 mg total) by mouth 2 (two) times daily with a meal. 09/17/15  Yes Calvert Cantor, MD  Methylcellulose, Laxative, (MIRAFIBER) 500 MG TABS Take 500 mg by mouth daily.   Yes Historical Provider, MD  Multiple Vitamin (MULTIVITAMIN WITH MINERALS) TABS tablet Take 1 tablet by mouth daily.   Yes Historical Provider, MD  nitrofurantoin, macrocrystal-monohydrate, (MACROBID) 100 MG capsule Take 100 mg by mouth at bedtime.   Yes Historical Provider, MD  sacubitril-valsartan (ENTRESTO) 24-26 MG Take 1 tablet by mouth 2 (two) times daily. 02/04/16  Yes Laurey Morale, MD  spironolactone (ALDACTONE) 25 MG tablet Take 0.5 tablets (12.5 mg total) by mouth daily. 03/02/16  Yes Laurey Morale, MD  traMADol (ULTRAM) 50 MG tablet Take 50 mg by mouth every 6 (six) hours as needed for moderate pain (pt takes qhs and PRN).    Yes Historical Provider, MD  NOVOTWIST 32G X 5 MM MISC Use as directed with Victozia. 08/19/15   Historical Provider, MD  Letta Pate VERIO test strip Check blood glucose once daily 08/19/15   Historical Provider, MD       Physical Exam: BP 110/60 mmHg  Pulse 96  Temp(Src) 99.9 F (37.7 C) (Oral)  Resp 17  Ht 5\' 5"  (1.651 m)  Wt 72.576 kg (160 lb)  BMI 26.63 kg/m2  SpO2 95% General appearance: Well-developed, adult female,  alert and in no acute distress.  Mentating well.   Eyes: Anicteric, conjunctiva pink, lids and lashes normal.     ENT: No nasal deformity, discharge, or epistaxis.  OP moist without lesions.   Lymph: No cervical or supraclavicular lymphadenopathy. Skin: Warm and dry.  No jaundice.  No suspicious rashes or lesions. Cardiac: Tachycardic, regular, nl S1-S2, no murmurs appreciated.  Capillary refill is brisk.  JVP normal.  No LE edema.  Radial and DP pulses 2+ and symmetric. Respiratory: Normal respiratory rate and rhythm.  CTAB without rales or wheezes. Abdomen: Abdomen soft without rigidity.  No TTP. No ascites, distension.   MSK: No deformities or effusions.  No CVA tenderness. Neuro: Cranial nerves normal. Sensorium intact and responding to questions, attention normal.  Speech is fluent.  Moves all extremities equally and with normal coordination.    Psych: Behavior appropriate.  Affect normal.  No evidence of aural  or visual hallucinations or delusions.       Labs on Admission:  I have personally reviewed following labs and imaging studies: CBC:  Recent Labs Lab 03/20/16 2339  WBC 11.1*  NEUTROABS 10.4*  HGB 11.9*  HCT 34.8*  MCV 88.5  PLT 268   Basic Metabolic Panel:  Recent Labs Lab 03/15/16 1358 03/20/16 2339  NA 137 131*  K 4.3 3.8  CL 98 96*  CO2 31 26  GLUCOSE 164* 318*  BUN 13 15  CREATININE 0.93 1.11*  CALCIUM 9.6 8.8*   GFR: Estimated Creatinine Clearance: 59.2 mL/min (by C-G formula based on Cr of 1.11). Liver Function Tests:  Recent Labs Lab 03/20/16 2339  AST 21  ALT 20  ALKPHOS 90  BILITOT 0.4  PROT 7.4  ALBUMIN 4.1   No results for input(s): LIPASE, AMYLASE in the last 168 hours. No results for input(s): AMMONIA in the last 168 hours. Coagulation Profile: No results for input(s): INR, PROTIME in the last 168 hours. Cardiac Enzymes: No results for input(s): CKTOTAL, CKMB, CKMBINDEX, TROPONINI in the last 168 hours. BNP (last 3 results) No  results for input(s): PROBNP in the last 8760 hours. HbA1C: No results for input(s): HGBA1C in the last 72 hours. CBG: No results for input(s): GLUCAP in the last 168 hours. Lipid Profile: No results for input(s): CHOL, HDL, LDLCALC, TRIG, CHOLHDL, LDLDIRECT in the last 72 hours. Thyroid Function Tests: No results for input(s): TSH, T4TOTAL, FREET4, T3FREE, THYROIDAB in the last 72 hours. Anemia Panel: No results for input(s): VITAMINB12, FOLATE, FERRITIN, TIBC, IRON, RETICCTPCT in the last 72 hours. Sepsis Labs: Lactic acid 2.21 --> 1.6 with fluids @LABRCNTIP (procalcitonin:4,lacticidven:4) )No results found for this or any previous visit (from the past 240 hour(s)).       Radiological Exams on Admission: Personally reviewed: Ct Abdomen Pelvis W Contrast  03/21/2016  CLINICAL DATA:  Fever and chills. Urinary tract infection. Clinical concern for perinephric abscess. EXAM: CT ABDOMEN AND PELVIS WITH CONTRAST TECHNIQUE: Multidetector CT imaging of the abdomen and pelvis was performed using the standard protocol following bolus administration of intravenous contrast. CONTRAST:  ISOVUE-300 IOPAMIDOL (ISOVUE-300) INJECTION 61% COMPARISON:  None. FINDINGS: Lower chest: Mild dependent atelectasis. No consolidation. No pleural effusion. Liver: No focal lesion. Focal fatty infiltration adjacent to the porta hepatis. Hepatobiliary: Gallbladder is decompressed. No calcified stone. No biliary dilatation. Pancreas: No ductal dilatation or inflammation. Spleen: Prominent in size 14 cm AP.  No focal lesion. Adrenal glands: No nodule. Kidneys: Heterogeneous enhancement of the posterior upper left kidney, best appreciated on delayed phase. Minimal nonspecific perinephric stranding, right greater than left. No intrarenal or perirenal fluid collection or abscess. No hydronephrosis. No focal renal lesion. Symmetric excretion on delayed phase imaging. Ureters are decompressed. Stomach/Bowel: Stomach distended  with ingested contents. There are no dilated or thickened small bowel loops. Moderate to large stool burden with diffuse colonic tortuosity is suggesting constipation. No wall thickening. The appendix is normal. Vascular/Lymphatic: No retroperitoneal adenopathy. Abdominal aorta is normal in caliber. Reproductive: Lobular uterine contours with probable fibroids, scattered calcifications. Ovaries symmetric in size. No adnexal mass. Bladder: Physiologically distended. No wall thickening. No perivesicular inflammation. Other: No free air, free fluid, or intra-abdominal fluid collection. Tiny fat containing umbilical hernia. Subcutaneous soft tissue induration left greater than right anterior abdominal wall, likely injection related. Musculoskeletal: There are no acute or suspicious osseous abnormalities. Degenerative disc disease L5-S1. IMPRESSION: 1. Heterogeneous left renal parenchyma, may reflect pyelonephritis. No perinephric or intrarenal abscess. 2. Moderate  to large diffuse stool burden in colonic tortuosity, it would Electronically Signed   By: Rubye Oaks M.D.   On: 03/21/2016 02:45    EKG: Independently reviewed. Rate 143m, Qtc 425, lateral TWI are old.  No change from previous.    Echo 02/2016: EF 35%, diffuse hypokinesis, grade I diastolic dysfunction, no significant valvular disease     Assessment/Plan 1. Sepsis from UTI:  Early sepsis.  Suspected source urine. Organism unknown. Patient meets criteria given tachycardia, tachypnea, fever, and evidence of organ dysfunction.  Lactate 2.21 mmol/L and repeat ordered within 6 hours.  This patient is not at high risk of poor outcomes with a SOFA score of 0.  Antibiotics delivered in the ED.    -Sepsis bundle utilized:  -Blood and urine cultures drawn  -30 ml/kg bolus given in ED, will repeat lactic acid  -Start targeted antibiotics with ceftriaxone, based on suspected source of infection    -Repeat renal function and complete blood count  in AM  -Code SEPSIS called to E-link    2. Chronic systolic CHF:  Euvolemic on my exam. -Hold spironolactone, furosemide for now -Continue Coreg, Entresto, and Ivabradine  3. NIDDM:  -Hold metformin, Victoza, glimepiride while inpatient -Sliding scale corrections  4. Hyponatremia:  -Check free water clearance -trend BMP  5. Chronic normocytic anemia:  Stable  6. HTN:  -Continue aspirin, statin -Continue Coreg, Entresto  7. Fibromyalgia:  -Continue home fluoxetine, gabapentin, tramadol     DVT prophylaxis: Lovenox  Code Status: FULL  Family Communication: Husband at bedside  Disposition Plan: Anticipate IV antibiotics and follow culture data, likely IV antibiotics for 2-3 days until culture and sensitivities return Consults called: None Admission status: Inpatient, med surg   Medical decision making: Patient seen at 4:22 AM on 03/21/2016.  The patient was discussed with Dub Mikes, PA-C. What exists of the patient's chart was reviewed in depth.  Clinical condition: improving with fluids, mentating well, not requiring supplemental oxygen, stable for med surg bed at time fo my evaluation.        Alberteen Sam [facility] Pager [phone]

## 2016-03-22 DIAGNOSIS — E119 Type 2 diabetes mellitus without complications: Secondary | ICD-10-CM

## 2016-03-22 DIAGNOSIS — N10 Acute tubulo-interstitial nephritis: Secondary | ICD-10-CM

## 2016-03-22 DIAGNOSIS — E871 Hypo-osmolality and hyponatremia: Secondary | ICD-10-CM

## 2016-03-22 DIAGNOSIS — I10 Essential (primary) hypertension: Secondary | ICD-10-CM

## 2016-03-22 DIAGNOSIS — M797 Fibromyalgia: Secondary | ICD-10-CM

## 2016-03-22 LAB — HEMOGLOBIN A1C
Hgb A1c MFr Bld: 8.1 % — ABNORMAL HIGH (ref 4.8–5.6)
Mean Plasma Glucose: 186 mg/dL

## 2016-03-22 LAB — GLUCOSE, CAPILLARY: Glucose-Capillary: 241 mg/dL — ABNORMAL HIGH (ref 65–99)

## 2016-03-22 LAB — URINE CULTURE

## 2016-03-22 MED ORDER — CEPHALEXIN 500 MG PO CAPS: 500.0000 mg | ORAL_CAPSULE | Freq: Three times a day (TID) | ORAL | Status: DC

## 2016-03-22 NOTE — Progress Notes (Signed)
Patient verbalized understanding of discharge instructions. Patient is stable at discharge. 

## 2016-03-22 NOTE — Discharge Summary (Signed)
PATIENT DETAILS Name: Robin Owen Age: 55 y.o. Sex: female Date of Birth: October 16, 1961 MRN: [id number]. Admitting Physician: Alberteen Sam, MD WUJ:[id number],NFAOZHY D, MD  Admit Date: 03/20/2016 Discharge date: 03/22/2016  Recommendations for Outpatient Follow-up:  1. Please repeat CBC/BMET at next visit 2. Please follow blood cultures till final 3. Suggest refer to ID clinic for evaluation if continues to have recurrent UTI's  PRIMARY DISCHARGE DIAGNOSIS:  Principal Problem:   Sepsis (HCC) Active Problems:   Hyperlipidemia   Essential hypertension   Fibromyalgia   Hypothyroidism   Chronic systolic heart failure (HCC)   Pyelonephritis   Hyponatremia   Type 2 diabetes mellitus without complication, without long-term current use of insulin (HCC)   Normocytic anemia      PAST MEDICAL HISTORY: Past Medical History  Diagnosis Date  . Diabetes mellitus without complication (HCC)   . Hypertension   . Fibromyalgia   . Hypothyroidism   . Hyperlipidemia   . CHF (congestive heart failure) (HCC)   . Depression   . Anemia     DISCHARGE MEDICATIONS: Current Discharge Medication List    START taking these medications   Details  cephALEXin (KEFLEX) 500 MG capsule Take 1 capsule (500 mg total) by mouth 3 (three) times daily. Qty: 24 capsule, Refills: 0      CONTINUE these medications which have NOT CHANGED   Details  aspirin 81 MG tablet Take 81 mg by mouth daily.    atorvastatin (LIPITOR) 10 MG tablet Take 10 mg by mouth daily.    carvedilol (COREG) 12.5 MG tablet Take 1 tablet (12.5 mg total) by mouth 2 (two) times daily with a meal. Qty: 60 tablet, Refills: 6    cyclobenzaprine (FLEXERIL) 10 MG tablet Take 10 mg by mouth 2 (two) times daily as needed for muscle spasms (pt takes qhs and PRN).     FLUoxetine (PROZAC) 20 MG tablet Take 20 mg by mouth daily.    furosemide (LASIX) 40 MG tablet Take 1 tablet (40 mg total) by mouth daily. Qty: 60 tablet,  Refills: 3    gabapentin (NEURONTIN) 600 MG tablet Take 300-600 mg by mouth 3 (three) times daily as needed (for fibromyalgia). Take 1/2 tablet (300mg ) by mouth in the morning, and afternonn, and 1 tablet (600mg ) at bedtime    glimepiride (AMARYL) 2 MG tablet Take 2 mg by mouth daily as needed (on average pt takes 3x weekly). Take if BG >160 Refills: 1    Iron-FA-B Cmp-C-Biot-Probiotic (FUSION PLUS PO) Take 1 capsule by mouth daily.    ivabradine (CORLANOR) 5 MG TABS tablet Take 0.5 tablets (2.5 mg total) by mouth 2 (two) times daily with a meal. Qty: 60 tablet, Refills: 6   Associated Diagnoses: Chronic systolic heart failure (HCC)    levothyroxine (SYNTHROID, LEVOTHROID) 88 MCG tablet Take 88 mcg by mouth daily before breakfast.    Liraglutide (VICTOZA) 18 MG/3ML SOPN Inject 1.8 mg into the skin daily.    metFORMIN (GLUCOPHAGE) 1000 MG tablet Take 1 tablet (1,000 mg total) by mouth 2 (two) times daily with a meal.    Methylcellulose, Laxative, (MIRAFIBER) 500 MG TABS Take 500 mg by mouth daily.    Multiple Vitamin (MULTIVITAMIN WITH MINERALS) TABS tablet Take 1 tablet by mouth daily.    nitrofurantoin, macrocrystal-monohydrate, (MACROBID) 100 MG capsule Take 100 mg by mouth at bedtime.    sacubitril-valsartan (ENTRESTO) 24-26 MG Take 1 tablet by mouth 2 (two) times daily. Qty: 60 tablet, Refills: 6    spironolactone (  ALDACTONE) 25 MG tablet Take 0.5 tablets (12.5 mg total) by mouth daily. Qty: 45 tablet, Refills: 3    traMADol (ULTRAM) 50 MG tablet Take 50 mg by mouth every 6 (six) hours as needed for moderate pain (pt takes qhs and PRN).     NOVOTWIST 32G X 5 MM MISC Use as directed with Victozia. Refills: 2    ONETOUCH VERIO test strip Check blood glucose once daily Refills: 2        ALLERGIES:  No Known Allergies  BRIEF HPI:  See H&P, Labs, Consult and Test reports for all details in brief,Patient is a 55 y.o. female with past medical history of chronic systolic  heart failure, fibromyalgia and recurrent UTIs admitted with fever, back pain and dysuria. Found to have probable pyelonephritis and admitted to the hospital.  CONSULTATIONS:   None  PERTINENT RADIOLOGIC STUDIES: Ct Abdomen Pelvis W Contrast  03/21/2016  CLINICAL DATA:  Fever and chills. Urinary tract infection. Clinical concern for perinephric abscess. EXAM: CT ABDOMEN AND PELVIS WITH CONTRAST TECHNIQUE: Multidetector CT imaging of the abdomen and pelvis was performed using the standard protocol following bolus administration of intravenous contrast. CONTRAST:  ISOVUE-300 IOPAMIDOL (ISOVUE-300) INJECTION 61% COMPARISON:  None. FINDINGS: Lower chest: Mild dependent atelectasis. No consolidation. No pleural effusion. Liver: No focal lesion. Focal fatty infiltration adjacent to the porta hepatis. Hepatobiliary: Gallbladder is decompressed. No calcified stone. No biliary dilatation. Pancreas: No ductal dilatation or inflammation. Spleen: Prominent in size 14 cm AP.  No focal lesion. Adrenal glands: No nodule. Kidneys: Heterogeneous enhancement of the posterior upper left kidney, best appreciated on delayed phase. Minimal nonspecific perinephric stranding, right greater than left. No intrarenal or perirenal fluid collection or abscess. No hydronephrosis. No focal renal lesion. Symmetric excretion on delayed phase imaging. Ureters are decompressed. Stomach/Bowel: Stomach distended with ingested contents. There are no dilated or thickened small bowel loops. Moderate to large stool burden with diffuse colonic tortuosity is suggesting constipation. No wall thickening. The appendix is normal. Vascular/Lymphatic: No retroperitoneal adenopathy. Abdominal aorta is normal in caliber. Reproductive: Lobular uterine contours with probable fibroids, scattered calcifications. Ovaries symmetric in size. No adnexal mass. Bladder: Physiologically distended. No wall thickening. No perivesicular inflammation. Other: No free  air, free fluid, or intra-abdominal fluid collection. Tiny fat containing umbilical hernia. Subcutaneous soft tissue induration left greater than right anterior abdominal wall, likely injection related. Musculoskeletal: There are no acute or suspicious osseous abnormalities. Degenerative disc disease L5-S1. IMPRESSION: 1. Heterogeneous left renal parenchyma, may reflect pyelonephritis. No perinephric or intrarenal abscess. 2. Moderate to large diffuse stool burden in colonic tortuosity, it would Electronically Signed   By: Rubye Oaks M.D.   On: 03/21/2016 02:45     PERTINENT LAB RESULTS: CBC:  Recent Labs  03/20/16 2339 03/21/16 0533  WBC 11.1* 7.8  HGB 11.9* 10.8*  HCT 34.8* 31.7*  PLT 268 238   CMET CMP     Component Value Date/Time   NA 137 03/21/2016 0809   K 3.7 03/21/2016 0809   CL 101 03/21/2016 0809   CO2 26 03/21/2016 0809   GLUCOSE 209* 03/21/2016 0809   BUN 13 03/21/2016 0809   CREATININE 0.93 03/21/2016 0809   CREATININE 0.93 03/15/2016 1358   CALCIUM 8.5* 03/21/2016 0809   PROT 7.4 03/20/2016 2339   ALBUMIN 4.1 03/20/2016 2339   AST 21 03/20/2016 2339   ALT 20 03/20/2016 2339   ALKPHOS 90 03/20/2016 2339   BILITOT 0.4 03/20/2016 2339   GFRNONAA >60 03/21/2016 0809  GFRAA >60 03/21/2016 0809    GFR Estimated Creatinine Clearance: 70.5 mL/min (by C-G formula based on Cr of 0.93). No results for input(s): LIPASE, AMYLASE in the last 72 hours. No results for input(s): CKTOTAL, CKMB, CKMBINDEX, TROPONINI in the last 72 hours. Invalid input(s): POCBNP No results for input(s): DDIMER in the last 72 hours.  Recent Labs  03/21/16 0533  HGBA1C 8.1*   No results for input(s): CHOL, HDL, LDLCALC, TRIG, CHOLHDL, LDLDIRECT in the last 72 hours. No results for input(s): TSH, T4TOTAL, T3FREE, THYROIDAB in the last 72 hours.  Invalid input(s): FREET3 No results for input(s): VITAMINB12, FOLATE, FERRITIN, TIBC, IRON, RETICCTPCT in the last 72  hours. Coags: No results for input(s): INR in the last 72 hours.  Invalid input(s): PT Microbiology: Recent Results (from the past 240 hour(s))  Urine culture     Status: Abnormal   Collection Time: 03/20/16 10:59 PM  Result Value Ref Range Status   Specimen Description URINE, CLEAN CATCH  Final   Special Requests NONE  Final   Culture MULTIPLE SPECIES PRESENT, SUGGEST RECOLLECTION (A)  Final   Report Status 03/22/2016 FINAL  Final  Blood Culture (routine x 2)     Status: None (Preliminary result)   Collection Time: 03/20/16 11:30 PM  Result Value Ref Range Status   Specimen Description BLOOD LEFT ANTECUBITAL  Final   Special Requests   Final    BOTTLES DRAWN AEROBIC AND ANAEROBIC 10CC Performed at [facility]    Culture PENDING  Incomplete   Report Status PENDING  Incomplete  Blood Culture (routine x 2)     Status: None (Preliminary result)   Collection Time: 03/20/16 11:39 PM  Result Value Ref Range Status   Specimen Description   Final    BLOOD LEFT ANTECUBITAL Performed at [facility]    Special Requests BOTTLES DRAWN AEROBIC AND ANAEROBIC 5CC  Final   Culture PENDING  Incomplete   Report Status PENDING  Incomplete     BRIEF HOSPITAL COURSE:  Sepsis likely secondary to pyelonephritis: Sepsis pathophysiology has resolved with IVF and intravenous Rocephin. Unfortuantely urine cultures are positive for multiple bacterial morphotypes,blood cultures are negative to date (confirmed with micro before discharge). She had urine cx done on 5/22 at [facility], spoke with Dr Vernie Ammons, those cultures show insignificant growth (6000 colonies). Patient apparently as had repeated UTI's this yea. This admission-she presented with Fever, back pain (Right>left), dyuria and frequency-CT Abdomen was negative for a abscess but suggestive of right sided pyelonephritis-so although cultures are inconclusive, I am inclined to treat her as if she had pyelonephritis. She has  defervesced and improved with IV Rocephin, so at this time will transition her to Keflex for 8 more days. She is agreeable with this plan, Dr Vernie Ammons plans to place her on prophylactic Abx once she completes Keflex.Dr Vernie Ammons does not see any evidence of structural urologic issues at this point. I have asked her to follow up with ID Clinic as well-as she probably needs further evaluation-and with her getting this many episodes of UTI requiring treatment, she is aware of the need to minimize Abx therapy as much as possible as she is at risk for developing C Diff.  Active Problems: Mild hyponatremia: Resolved  AKI: Resolved with gentle hydration.  Chronic systolic heart failure (EF 35-40% on 03/15/16): Compensated, resume Coreg and Aldactone, continue Entresto and Ivabradine.   Anemia: Likely secondary to chronic disease-follow  Type 2 diabetes: CBGs reasonably controlled, managed with SSI-and low-dose  Lantus while inpatient Resume oral hypoglycemic agents on discharge.  Hypertension: Stable with Coreg,Entresto and Aldactone  Hypothyroidism: Continue Synthroid  Dyslipidemia: Continue statin  Fibromyalgia: Appears stable-continue fluoxetine, Neurontin and tramadol.   TODAY-DAY OF DISCHARGE:  Subjective:   Darrel Baroni today has no headache,no chest abdominal pain,no new weakness tingling or numbness, feels much better wants to go home today.   Objective:   Blood pressure 127/70, pulse 74, temperature 98 F (36.7 C), temperature source Oral, resp. rate 20, height 5\' 5"  (1.651 m), weight 72.167 kg (159 lb 1.6 oz), SpO2 96 %.  Intake/Output Summary (Last 24 hours) at 03/22/16 1001 Last data filed at 03/22/16 0700  Gross per 24 hour  Intake    290 ml  Output    300 ml  Net    -10 ml   Filed Weights   03/21/16 0104 03/21/16 0446 03/22/16 0502  Weight: 72.576 kg (160 lb) 74.5 kg (164 lb 3.9 oz) 72.167 kg (159 lb 1.6 oz)    Exam Awake Alert, Oriented *3, No new F.N deficits, Normal  affect [place].AT,PERRAL Supple Neck,No JVD, No cervical lymphadenopathy appriciated.  Symmetrical Chest wall movement, Good air movement bilaterally, CTAB RRR,No Gallops,Rubs or new Murmurs, No Parasternal Heave +ve B.Sounds, Abd Soft, Non tender, No organomegaly appriciated, No rebound -guarding or rigidity. No Cyanosis, Clubbing or edema, No new Rash or bruise  DISCHARGE CONDITION: Stable  DISPOSITION: Home  DISCHARGE INSTRUCTIONS:    Activity:  As tolerated with Full fall precautions use walker/cane & assistance as needed  Get Medicines reviewed and adjusted: Please take all your medications with you for your next visit with your Primary MD  Please request your Primary MD to go over all hospital tests and procedure/radiological results at the follow up, please ask your Primary MD to get all Hospital records sent to his/her office.  If you experience worsening of your admission symptoms, develop shortness of breath, life threatening emergency, suicidal or homicidal thoughts you must seek medical attention immediately by calling 911 or calling your MD immediately  if symptoms less severe.  You must read complete instructions/literature along with all the possible adverse reactions/side effects for all the Medicines you take and that have been prescribed to you. Take any new Medicines after you have completely understood and accpet all the possible adverse reactions/side effects.   Do not drive when taking Pain medications.   Do not take more than prescribed Pain, Sleep and Anxiety Medications  Special Instructions: If you have smoked or chewed Tobacco  in the last 2 yrs please stop smoking, stop any regular Alcohol  and or any Recreational drug use.  Wear Seat belts while driving.  Please note  You were cared for by a hospitalist during your hospital stay. Once you are discharged, your primary care physician will handle any further medical issues. Please note that NO REFILLS for any  discharge medications will be authorized once you are discharged, as it is imperative that you return to your primary care physician (or establish a relationship with a primary care physician if you do not have one) for your aftercare needs so that they can reassess your need for medications and monitor your lab values.   Diet recommendation: Diabetic Diet Heart Healthy diet  Discharge Instructions    (HEART FAILURE PATIENTS) Call MD:  Anytime you have any of the following symptoms: 1) 3 pound weight gain in 24 hours or 5 pounds in 1 week 2) shortness of breath, with or without a dry  hacking cough 3) swelling in the hands, feet or stomach 4) if you have to sleep on extra pillows at night in order to breathe.    Complete by:  As directed      Call MD for:  severe uncontrolled pain    Complete by:  As directed      Call MD for:  temperature >100.4    Complete by:  As directed      Diet - low sodium heart healthy    Complete by:  As directed      Diet Carb Modified    Complete by:  As directed      Increase activity slowly    Complete by:  As directed            Follow-up Information    Follow up with ALTHEIMER,MICHAEL D, MD. Schedule an appointment as soon as possible for a visit in 1 week.   Specialty:  Endocrinology   Why:  Hospital follow up   Contact information:   [facility] [place] [address] [phone]       Follow up with Garnett Farm, MD. Schedule an appointment as soon as possible for a visit in 2 weeks.   Specialty:  Urology   Contact information:   [address] [place] [address] [phone]       Follow up with [facility]             . Schedule an appointment as soon as possible for a visit in 2 weeks.   Why:  Hospital follow up   Contact information:   [address] [place] [address]      Total Time spent on discharge equals 45  minutes.  SignedJeoffrey Massed 03/22/2016 10:01 AM

## 2016-03-23 ENCOUNTER — Telehealth (HOSPITAL_COMMUNITY): Payer: Self-pay | Admitting: *Deleted

## 2016-03-23 ENCOUNTER — Encounter (HOSPITAL_COMMUNITY): Admission: RE | Admit: 2016-03-23 | Payer: Managed Care, Other (non HMO) | Source: Ambulatory Visit

## 2016-03-26 LAB — CULTURE, BLOOD (ROUTINE X 2)
Culture: NO GROWTH
Culture: NO GROWTH

## 2016-03-27 ENCOUNTER — Telehealth (HOSPITAL_COMMUNITY): Payer: Self-pay | Admitting: Cardiac Rehabilitation

## 2016-03-27 NOTE — Telephone Encounter (Signed)
pc received from pt questioning when she can return to exercise at cardiac rehab. Pt has upcoming hospital f/u appt with Dr. Leslie Dales 03/29/16.   Will await MD evaluation at this appointment prior to returning. Clearance to return to exercise has been faxed to his office. Pt verbalized understanding.

## 2016-03-28 ENCOUNTER — Encounter (HOSPITAL_COMMUNITY): Admission: RE | Admit: 2016-03-28 | Payer: Managed Care, Other (non HMO) | Source: Ambulatory Visit

## 2016-03-30 ENCOUNTER — Encounter (HOSPITAL_COMMUNITY)
Admission: RE | Admit: 2016-03-30 | Discharge: 2016-03-30 | Disposition: A | Discharge status: Home / Self Care | Payer: Managed Care, Other (non HMO) | Source: Ambulatory Visit | Attending: Cardiology | Admitting: Cardiology

## 2016-03-30 ENCOUNTER — Telehealth (HOSPITAL_COMMUNITY): Payer: Self-pay | Admitting: Cardiac Rehabilitation

## 2016-03-30 DIAGNOSIS — I509 Heart failure, unspecified: Secondary | ICD-10-CM | POA: Diagnosis present

## 2016-03-30 NOTE — Telephone Encounter (Signed)
-----   Message from Hali Marry, MD sent at 03/29/2016  4:48 PM EDT ----- Regarding: RE: cardiac rehab  She may resume cardiac rehab on 03/30/16.  Thanks.  Veverly Fells. Altheimer, MD   ----- Message -----    From: Robyne Peers, RN    Sent: 03/27/2016   4:47 PM      To: Hali Marry, MD Subject: cardiac rehab                                  Dear Dr. Leslie Dales,  Pt was recently hospitalized 03/20/16-02/2516.  She has hospital f/u appt with you on 03/29/16.  Please indicate when she can return to cardiac rehab.  Thank you, Deveron Furlong, RN, BSN Cardiac Pulmonary Rehab

## 2016-04-02 ENCOUNTER — Encounter (HOSPITAL_COMMUNITY)
Admission: RE | Admit: 2016-04-02 | Discharge: 2016-04-02 | Disposition: A | Discharge status: Home / Self Care | Payer: Managed Care, Other (non HMO) | Source: Ambulatory Visit | Attending: Cardiology | Admitting: Cardiology

## 2016-04-02 DIAGNOSIS — I509 Heart failure, unspecified: Secondary | ICD-10-CM | POA: Diagnosis not present

## 2016-04-03 ENCOUNTER — Ambulatory Visit (HOSPITAL_COMMUNITY)
Admission: RE | Admit: 2016-04-03 | Discharge: 2016-04-03 | Disposition: A | Discharge status: Home / Self Care | Payer: Managed Care, Other (non HMO) | Source: Ambulatory Visit | Attending: Cardiology | Admitting: Cardiology

## 2016-04-03 ENCOUNTER — Encounter (HOSPITAL_COMMUNITY): Payer: Self-pay

## 2016-04-03 VITALS — BP 102/68 | HR 79 | Wt 159.0 lb

## 2016-04-03 DIAGNOSIS — E119 Type 2 diabetes mellitus without complications: Secondary | ICD-10-CM | POA: Diagnosis not present

## 2016-04-03 DIAGNOSIS — F329 Major depressive disorder, single episode, unspecified: Secondary | ICD-10-CM | POA: Diagnosis not present

## 2016-04-03 DIAGNOSIS — E039 Hypothyroidism, unspecified: Secondary | ICD-10-CM | POA: Diagnosis not present

## 2016-04-03 DIAGNOSIS — Z7984 Long term (current) use of oral hypoglycemic drugs: Secondary | ICD-10-CM | POA: Diagnosis not present

## 2016-04-03 DIAGNOSIS — Z841 Family history of disorders of kidney and ureter: Secondary | ICD-10-CM | POA: Insufficient documentation

## 2016-04-03 DIAGNOSIS — I5022 Chronic systolic (congestive) heart failure: Secondary | ICD-10-CM

## 2016-04-03 DIAGNOSIS — Z7982 Long term (current) use of aspirin: Secondary | ICD-10-CM | POA: Insufficient documentation

## 2016-04-03 DIAGNOSIS — Z8249 Family history of ischemic heart disease and other diseases of the circulatory system: Secondary | ICD-10-CM | POA: Insufficient documentation

## 2016-04-03 DIAGNOSIS — Z833 Family history of diabetes mellitus: Secondary | ICD-10-CM | POA: Insufficient documentation

## 2016-04-03 DIAGNOSIS — M797 Fibromyalgia: Secondary | ICD-10-CM | POA: Diagnosis not present

## 2016-04-03 DIAGNOSIS — Z79899 Other long term (current) drug therapy: Secondary | ICD-10-CM | POA: Insufficient documentation

## 2016-04-03 DIAGNOSIS — I110 Hypertensive heart disease with heart failure: Secondary | ICD-10-CM | POA: Diagnosis not present

## 2016-04-03 DIAGNOSIS — E785 Hyperlipidemia, unspecified: Secondary | ICD-10-CM | POA: Diagnosis not present

## 2016-04-03 MED ORDER — SPIRONOLACTONE 25 MG PO TABS: 25.0000 mg | ORAL_TABLET | Freq: Every day | ORAL | Status: DC

## 2016-04-03 MED ORDER — IVABRADINE HCL 5 MG PO TABS: 5.0000 mg | ORAL_TABLET | Freq: Two times a day (BID) | ORAL | Status: DC

## 2016-04-03 NOTE — Patient Instructions (Addendum)
Increase Spironolactone 25 (1 tablet) daily.  Increase Corlanor to 5mg  (1 tablet) twice daily.  Lab work in 10 days bmet bnp  Follow up with Dr.McLean in 2 months

## 2016-04-04 ENCOUNTER — Encounter (HOSPITAL_COMMUNITY)
Admission: RE | Admit: 2016-04-04 | Discharge: 2016-04-04 | Disposition: A | Discharge status: Home / Self Care | Payer: Managed Care, Other (non HMO) | Source: Ambulatory Visit | Attending: Cardiology | Admitting: Cardiology

## 2016-04-04 ENCOUNTER — Encounter (HOSPITAL_COMMUNITY): Payer: Self-pay

## 2016-04-04 DIAGNOSIS — I509 Heart failure, unspecified: Secondary | ICD-10-CM | POA: Diagnosis not present

## 2016-04-04 NOTE — Progress Notes (Addendum)
Pt graduated from cardiac rehab program today with completion of 36 exercise sessions in Phase II. Pt maintained good attendance and progressed nicely during her participation in rehab as evidenced by increased MET level.  Pt  absences have been due to medical complications. Pt is closely monitored by Dr. Aundra Dubin and Dr. Elyse Hsu.   Medication list reconciled.  Pt recently started new medications as prescribed by Dr. Aundra Dubin Repeat  PHQ score- 0, which is improved.  Pt cardiac rehab experience has had remarkable increase in pt quality of life as indicated by post scores. Pt attitude and demeanor reflect these scores in her upbeat and positive interactions with staff and peers.       Quality of Life - 04/06/16 1143    Quality of Life Scores   Health/Function Pre 9 %   Health/Function Post 29.75 %   Health/Function % Change 230.56 %   Socioeconomic Pre 13.5 %   Socioeconomic Post 29.83 %   Socioeconomic % Change  120.96 %   Psych/Spiritual Pre 1.71 %   Psych/Spiritual Post 30 %   Psych/Spiritual % Change 1654.39 %   Family Pre 4.8 %   Family Post 30 %   Family % Change 525 %   GLOBAL Pre 7.77 %   GLOBAL Post 28.8 %   GLOBAL % Change 270.66 %     Pt is feeling more positive and hopeful about her condition due to increased physical stamina and improved EF results.  Pt has made significant lifestyle changes and should be commended for her success. Pt feels she has achieved her goals during cardiac rehab. Pt plan to exercise on her own however may return to cardiac maintenance program if she feels she is not compliant on her own.

## 2016-04-04 NOTE — Progress Notes (Signed)
Patient ID: Robin Owen, female   DOB: Mar 22, 1961, 54 y.o.   MRN: [id number] PCP: Dr Altheimer Cardiology: Dr. Shirlee Latch  HPI: Robin Owen is a 55 year old Caucasian female with past medical history of HTN, HLD, DMII, hypothyroidism and fibromyalgia (on disability).   Admitted 11/16 through 09/17/15 with increased dyspnea. CXR with pulmonary edema. This prompted and ECHO that showed reduced EF 20%. RHC/LHC showed normal coronaries and reduced cardiac index. Cardiac MRI showed some mid-wall late gadolinium enhancement in the septum.  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 She has been lisinopril for some time. Started on coreg and lasix.  Discharge weight was 163 pounds.   She developed AKI and hyperkalemia in the setting of high dose Ibuprofen.  I asked her to stop this and started her on tramadol as needed instead.    Last echo in 5/17 showed some improvement, EF up to 35-40%.     She is stable symptomatically.  She is short of breath walking up steps.  She does ok on flat ground. No orthopnea/PND.  No lightheadedness/syncope. She is in cardiac rehab. Weight is down 2 lbs again.   Test/Procedures  09/15/2015 ECHO EF 20% MV mild regurgitation TV mild regurgitation  09/19/2015 CMRI 1. Severely dilated left ventricle with normal wall thickness and severe systolic dysfunction (LVEF 20-25%) with global hypokinesis and akinesis of the entire septal wall and diffuse mid wall late gadolinium enhancement. 2. Moderate RV dilatation with mild moderate systolic dysfunction. 3. Moderate left and right atrial dilatation. 4. Mild to moderate mitral and mild tricuspid regurgitation. 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  RHC/LHC 09/16/2015  RA 7 PCWP 29 CO 3.11 CI 1.7  Normal Cors  Echo (5/17) with EF 35-40%.   Labs 09/15/2015: BNP 2354 Labs 09/16/2015: TSH 1.99 Labs 09/17/2015: K 3.8 Creatinine 0.91 Labs 09/29/2015: K 4.5  Creatinine 1.09 Labs 10/05/2015: K 5.3 Creatinine 1.21, LDL 74, HCT 37.2 Labs 1/17: SPEP negative Labs 2/17: K 3.9, creatinine 1.34, digoxin 0.7, hemoglobin 12.5 Labs 3/17: K 3.9, creatinine 1.25, hemoglobin 10 Labs 4/17: K 4.2, creatinine 1.15 Labs 5/17: K 3.8, creatinine 1.11, HCT 31.7  ROS: All systems negative except as listed in HPI, PMH and Problem List.  SH:  Social History   Social History  . Marital Status: Married    Spouse Name: N/A  . Number of Children: N/A  . Years of Education: N/A   Occupational History  . Not on file.   Social History Main Topics  . Smoking status: Never Smoker   . Smokeless tobacco: Never Used  . Alcohol Use: No  . Drug Use: No  . Sexual Activity: Yes    Birth Control/ Protection: None   Other Topics Concern  . Not on file   Social History Narrative    FH:  Family History  Problem Relation Age of Onset  . Pulmonary fibrosis Mother   . Hypertension Mother   . Atrial fibrillation Mother   . Hypertension Father   . Diabetes Mellitus II Father   . Kidney disease Father   . Diabetes Mellitus II Brother   . Hypertension Brother   . Atrial fibrillation Maternal Grandmother   . Hypertension Maternal Grandmother   . Stroke Maternal Grandmother     Past Medical History  Diagnosis Date  . Diabetes mellitus without complication (HCC)   . Hypertension   . Fibromyalgia   . Hypothyroidism   . Hyperlipidemia   . CHF (congestive heart failure) (HCC)   . Depression   .  Anemia     Current Outpatient Prescriptions  Medication Sig Dispense Refill  . aspirin 81 MG tablet Take 81 mg by mouth daily.    Marland Kitchen atorvastatin (LIPITOR) 10 MG tablet Take 10 mg by mouth daily.    . carvedilol (COREG) 12.5 MG tablet Take 1 tablet (12.5 mg total) by mouth 2 (two) times daily with a meal. 60 tablet 6  . cephALEXin (KEFLEX) 500 MG capsule Take 1 capsule (500 mg total) by mouth 3 (three) times daily. 24 capsule 0  . cyclobenzaprine (FLEXERIL) 10 MG  tablet Take 10 mg by mouth 2 (two) times daily as needed for muscle spasms (pt takes qhs and PRN).     Marland Kitchen FLUoxetine (PROZAC) 20 MG tablet Take 20 mg by mouth daily.    . furosemide (LASIX) 40 MG tablet Take 1 tablet (40 mg total) by mouth daily. 60 tablet 3  . gabapentin (NEURONTIN) 600 MG tablet Take 300-600 mg by mouth 3 (three) times daily as needed (for fibromyalgia). Take 1/2 tablet (300mg ) by mouth in the morning, and afternonn, and 1 tablet (600mg ) at bedtime    . glimepiride (AMARYL) 2 MG tablet Take 2 mg by mouth daily as needed (on average pt takes 3x weekly). Take if BG >160  1  . Iron-FA-B Cmp-C-Biot-Probiotic (FUSION PLUS PO) Take 1 capsule by mouth daily.    . ivabradine (CORLANOR) 5 MG TABS tablet Take 1 tablet (5 mg total) by mouth 2 (two) times daily with a meal. 60 tablet 6  . levothyroxine (SYNTHROID, LEVOTHROID) 88 MCG tablet Take 88 mcg by mouth daily before breakfast.    . Liraglutide (VICTOZA) 18 MG/3ML SOPN Inject 1.8 mg into the skin daily.    . metFORMIN (GLUCOPHAGE) 1000 MG tablet Take 1 tablet (1,000 mg total) by mouth 2 (two) times daily with a meal.    . Methylcellulose, Laxative, (MIRAFIBER) 500 MG TABS Take 500 mg by mouth daily.    . Multiple Vitamin (MULTIVITAMIN WITH MINERALS) TABS tablet Take 1 tablet by mouth daily.    Marland Kitchen NOVOTWIST 32G X 5 MM MISC Use as directed with Victozia.  2  . ONETOUCH VERIO test strip Check blood glucose once daily  2  . sacubitril-valsartan (ENTRESTO) 24-26 MG Take 1 tablet by mouth 2 (two) times daily. 60 tablet 6  . spironolactone (ALDACTONE) 25 MG tablet Take 1 tablet (25 mg total) by mouth daily. 90 tablet 3  . traMADol (ULTRAM) 50 MG tablet Take 50 mg by mouth every 6 (six) hours as needed for moderate pain (pt takes qhs and PRN).      No current facility-administered medications for this encounter.    Filed Vitals:   04/03/16 1405  BP: 102/68  Pulse: 79  Weight: 159 lb (72.122 kg)  SpO2: 100%    PHYSICAL  EXAM:  General:  NAD.  HEENT: normal Neck: supple. JVP 7 cm. Carotids 2+ bilaterally; no bruits. No lymphadenopathy or thryomegaly appreciated. Cor: PMI normal. Regular rate & rhythm. No rubs, gallops or murmurs. Lungs: clear Abdomen: soft, nontender, nondistended. No hepatosplenomegaly. No bruits or masses. Good bowel sounds. Extremities: no cyanosis, clubbing, rash, edema Neuro: alert & orientedx3, cranial nerves grossly intact. Moves all 4 extremities w/o difficulty. Affect pleasant.  ASSESSMENT & PLAN: 1. Chronic systolic CHF: 09/15/2015 ECHO EF 20%. Had cMRI EF 20-25%, RV mod dilated.  cMRI (11/16) showed an area of mid-wall septal late gadolinium enhancement. NICM perhaps related to HTN versus viral myocarditis. SPEP negative.  09/16/2015 RHC/LHC coronaries  ok, low cardiac index 1.7.  Repeat echo in 5/17 showed some improvement, EF 35-40%.  NYHA class II symptoms now.  She is not volume overloaded on exam today.  - Continue Lasix 40 mg daily.   - Increase spironolactone to 25 mg daily with BMET in 10 days.  - Continue Coreg 12.5 mg bid.  - Continue Entresto 24/26 bid. - Can increase ivabradine to 5 mg bid.  - EF out of range for ICD at this point.   2. AKI: Episode was related to viral illness + large ibuprofen dose + her other cardiac meds.  I have asked her to stop ibuprofen altogether.  3. Fibromyalgia: Disabled since [address] 04/04/2016

## 2016-04-06 ENCOUNTER — Encounter (HOSPITAL_COMMUNITY): Payer: Managed Care, Other (non HMO)

## 2016-04-09 ENCOUNTER — Encounter (HOSPITAL_COMMUNITY): Payer: Managed Care, Other (non HMO)

## 2016-04-11 ENCOUNTER — Encounter (HOSPITAL_COMMUNITY): Payer: Managed Care, Other (non HMO)

## 2016-04-13 ENCOUNTER — Ambulatory Visit (HOSPITAL_COMMUNITY)
Admission: RE | Admit: 2016-04-13 | Discharge: 2016-04-13 | Disposition: A | Discharge status: Home / Self Care | Payer: Managed Care, Other (non HMO) | Source: Ambulatory Visit | Attending: Internal Medicine | Admitting: Internal Medicine

## 2016-04-13 ENCOUNTER — Encounter (HOSPITAL_COMMUNITY): Payer: Managed Care, Other (non HMO)

## 2016-04-13 DIAGNOSIS — I5022 Chronic systolic (congestive) heart failure: Secondary | ICD-10-CM | POA: Diagnosis not present

## 2016-04-13 LAB — BASIC METABOLIC PANEL
Anion gap: 10 (ref 5–15)
BUN: 12 mg/dL (ref 6–20)
CO2: 26 mmol/L (ref 22–32)
Calcium: 9.3 mg/dL (ref 8.9–10.3)
Chloride: 100 mmol/L — ABNORMAL LOW (ref 101–111)
Creatinine, Ser: 1.26 mg/dL — ABNORMAL HIGH (ref 0.44–1.00)
GFR calc Af Amer: 56 mL/min — ABNORMAL LOW (ref >60)
GFR calc non Af Amer: 48 mL/min — ABNORMAL LOW (ref >60)
Glucose, Bld: 221 mg/dL — ABNORMAL HIGH (ref 65–99)
Potassium: 5.3 mmol/L — ABNORMAL HIGH (ref 3.5–5.1)
Sodium: 136 mmol/L (ref 135–145)

## 2016-04-13 LAB — BRAIN NATRIURETIC PEPTIDE: B Natriuretic Peptide: 45.2 pg/mL (ref 0.0–100.0)

## 2016-04-16 ENCOUNTER — Encounter (HOSPITAL_COMMUNITY): Payer: Managed Care, Other (non HMO)

## 2016-04-16 ENCOUNTER — Telehealth (HOSPITAL_COMMUNITY): Payer: Self-pay | Admitting: *Deleted

## 2016-04-16 ENCOUNTER — Encounter: Payer: Self-pay | Admitting: Cardiology

## 2016-04-16 DIAGNOSIS — I5022 Chronic systolic (congestive) heart failure: Secondary | ICD-10-CM

## 2016-04-16 NOTE — Telephone Encounter (Signed)
-----   Message from Laurey Morale, MD sent at 04/15/2016 10:43 PM EDT ----- Cut back on K in diet and repeat BMET 1 week.

## 2016-04-16 NOTE — Telephone Encounter (Signed)
Notes Recorded by Noralee Space, RN on 04/16/2016 at 10:46 AM Pt aware and agreeable, she will cut back on K in diet, repeat labs sch for 6/26

## 2016-04-18 ENCOUNTER — Encounter (HOSPITAL_COMMUNITY): Payer: Managed Care, Other (non HMO)

## 2016-04-20 ENCOUNTER — Encounter (HOSPITAL_COMMUNITY): Payer: Managed Care, Other (non HMO)

## 2016-04-23 ENCOUNTER — Ambulatory Visit (HOSPITAL_COMMUNITY)
Admission: RE | Admit: 2016-04-23 | Discharge: 2016-04-23 | Disposition: A | Discharge status: Home / Self Care | Payer: Managed Care, Other (non HMO) | Source: Ambulatory Visit | Attending: Cardiology | Admitting: Cardiology

## 2016-04-23 DIAGNOSIS — I5023 Acute on chronic systolic (congestive) heart failure: Secondary | ICD-10-CM | POA: Diagnosis not present

## 2016-04-23 DIAGNOSIS — I5022 Chronic systolic (congestive) heart failure: Secondary | ICD-10-CM | POA: Insufficient documentation

## 2016-04-23 LAB — BASIC METABOLIC PANEL
Anion gap: 11 (ref 5–15)
BUN: 12 mg/dL (ref 6–20)
CO2: 22 mmol/L (ref 22–32)
Calcium: 9.9 mg/dL (ref 8.9–10.3)
Chloride: 103 mmol/L (ref 101–111)
Creatinine, Ser: 0.98 mg/dL (ref 0.44–1.00)
GFR calc Af Amer: >60 mL/min (ref >60)
GFR calc non Af Amer: >60 mL/min (ref >60)
Glucose, Bld: 108 mg/dL — ABNORMAL HIGH (ref 65–99)
Potassium: 5.1 mmol/L (ref 3.5–5.1)
Sodium: 136 mmol/L (ref 135–145)

## 2016-04-24 ENCOUNTER — Telehealth (HOSPITAL_COMMUNITY): Payer: Self-pay

## 2016-04-24 MED ORDER — SPIRONOLACTONE 25 MG PO TABS: 12.5000 mg | ORAL_TABLET | Freq: Every day | ORAL | Status: DC

## 2016-04-24 NOTE — Telephone Encounter (Signed)
Notes Recorded by Chyrl Civatte, RN on 04/24/2016 at 9:04 AM Patient aware and agreeable, Rx updated to preferred pharmacy electronically, repeat bmet added on to upcoming apt Notes Recorded by Dolores Patty, MD on 04/23/2016 at 10:57 PM Cut spiro to 12.5 daily. Repeat 2 weeks.

## 2016-05-28 ENCOUNTER — Other Ambulatory Visit (HOSPITAL_COMMUNITY): Payer: Self-pay | Admitting: *Deleted

## 2016-05-28 MED ORDER — CARVEDILOL 12.5 MG PO TABS: 12.5000 mg | ORAL_TABLET | Freq: Two times a day (BID) | ORAL | 0 refills | Status: DC

## 2016-06-05 ENCOUNTER — Other Ambulatory Visit (HOSPITAL_COMMUNITY): Payer: Self-pay | Admitting: Adult Health

## 2016-06-12 ENCOUNTER — Ambulatory Visit (HOSPITAL_COMMUNITY)
Admission: RE | Admit: 2016-06-12 | Discharge: 2016-06-12 | Disposition: A | Discharge status: Home / Self Care | Payer: Managed Care, Other (non HMO) | Source: Ambulatory Visit | Attending: Cardiology | Admitting: Cardiology

## 2016-06-12 VITALS — BP 106/60 | HR 69 | Wt 169.0 lb

## 2016-06-12 DIAGNOSIS — Z833 Family history of diabetes mellitus: Secondary | ICD-10-CM | POA: Insufficient documentation

## 2016-06-12 DIAGNOSIS — F329 Major depressive disorder, single episode, unspecified: Secondary | ICD-10-CM | POA: Insufficient documentation

## 2016-06-12 DIAGNOSIS — Z79899 Other long term (current) drug therapy: Secondary | ICD-10-CM | POA: Insufficient documentation

## 2016-06-12 DIAGNOSIS — M797 Fibromyalgia: Secondary | ICD-10-CM | POA: Diagnosis not present

## 2016-06-12 DIAGNOSIS — Z7982 Long term (current) use of aspirin: Secondary | ICD-10-CM | POA: Insufficient documentation

## 2016-06-12 DIAGNOSIS — E039 Hypothyroidism, unspecified: Secondary | ICD-10-CM | POA: Insufficient documentation

## 2016-06-12 DIAGNOSIS — Z7984 Long term (current) use of oral hypoglycemic drugs: Secondary | ICD-10-CM | POA: Insufficient documentation

## 2016-06-12 DIAGNOSIS — E785 Hyperlipidemia, unspecified: Secondary | ICD-10-CM | POA: Insufficient documentation

## 2016-06-12 DIAGNOSIS — I428 Other cardiomyopathies: Secondary | ICD-10-CM | POA: Diagnosis not present

## 2016-06-12 DIAGNOSIS — Z841 Family history of disorders of kidney and ureter: Secondary | ICD-10-CM | POA: Diagnosis not present

## 2016-06-12 DIAGNOSIS — I110 Hypertensive heart disease with heart failure: Secondary | ICD-10-CM | POA: Insufficient documentation

## 2016-06-12 DIAGNOSIS — E119 Type 2 diabetes mellitus without complications: Secondary | ICD-10-CM | POA: Diagnosis not present

## 2016-06-12 DIAGNOSIS — I5022 Chronic systolic (congestive) heart failure: Secondary | ICD-10-CM | POA: Diagnosis present

## 2016-06-12 DIAGNOSIS — Z8249 Family history of ischemic heart disease and other diseases of the circulatory system: Secondary | ICD-10-CM | POA: Insufficient documentation

## 2016-06-12 LAB — BASIC METABOLIC PANEL
Anion gap: 11 (ref 5–15)
BUN: 11 mg/dL (ref 6–20)
CO2: 26 mmol/L (ref 22–32)
Calcium: 9.6 mg/dL (ref 8.9–10.3)
Chloride: 100 mmol/L — ABNORMAL LOW (ref 101–111)
Creatinine, Ser: 1.05 mg/dL — ABNORMAL HIGH (ref 0.44–1.00)
GFR calc Af Amer: >60 mL/min (ref >60)
GFR calc non Af Amer: 60 mL/min — ABNORMAL LOW (ref >60)
Glucose, Bld: 192 mg/dL — ABNORMAL HIGH (ref 65–99)
Potassium: 4.1 mmol/L (ref 3.5–5.1)
Sodium: 137 mmol/L (ref 135–145)

## 2016-06-12 MED ORDER — CARVEDILOL 12.5 MG PO TABS
18.7500 mg | ORAL_TABLET | Freq: Two times a day (BID) | ORAL | 3 refills | Status: DC
Start: 2016-06-12 — End: 2017-06-03

## 2016-06-12 NOTE — Patient Instructions (Signed)
Increase Carvedilol to 18.75mg  (1 & 1/2 tablets)  twice daily.   Routine lab work today. Will notify you of abnormal results  Follow up with Dr.McLean in 3 months

## 2016-06-13 NOTE — Progress Notes (Signed)
Patient ID: [name], female   DOB: 1961/07/22, 55 y.o.   MRN: [id number] PCP: Dr Altheimer Cardiology: Dr. Shirlee Latch  HPI: Keirra is a 55 year old Caucasian female with past medical history of HTN, HLD, DMII, hypothyroidism and fibromyalgia (on disability).   Admitted 11/16 through 09/17/15 with increased dyspnea. CXR with pulmonary edema. This prompted and ECHO that showed reduced EF 20%. RHC/LHC showed normal coronaries and reduced cardiac index. Cardiac MRI showed some mid-wall late gadolinium enhancement in the septum.  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 She has been lisinopril for some time. Started on coreg and lasix.  Discharge weight was 163 pounds.   She developed AKI and hyperkalemia in the setting of high dose Ibuprofen.  I asked her to stop this and started her on tramadol as needed instead.    Last echo in 5/17 showed some improvement, EF up to 35-40%.     She is stable symptomatically.  She is short of breath walking up steps.  She does ok on flat ground. No orthopnea/PND.  No lightheadedness/syncope. She completed cardiac rehab.  Spironolactone was decreased to 12.5 mg after last appointment due to hyperkalemia.   Test/Procedures  09/15/2015 ECHO EF 20% MV mild regurgitation TV mild regurgitation  09/19/2015 CMRI 1. Severely dilated left ventricle with normal wall thickness and severe systolic dysfunction (LVEF 20-25%) with global hypokinesis and akinesis of the entire septal wall and diffuse mid wall late gadolinium enhancement. 2. Moderate RV dilatation with mild moderate systolic dysfunction. 3. Moderate left and right atrial dilatation. 4. Mild to moderate mitral and mild tricuspid regurgitation. 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  RHC/LHC 09/16/2015  RA 7 PCWP 29 CO 3.11 CI 1.7  Normal Cors  Echo (5/17) with EF 35-40%.   Labs 09/15/2015: BNP 2354 Labs 09/16/2015: TSH 1.99 Labs  09/17/2015: K 3.8 Creatinine 0.91 Labs 09/29/2015: K 4.5 Creatinine 1.09 Labs 10/05/2015: K 5.3 Creatinine 1.21, LDL 74, HCT 37.2 Labs 1/17: SPEP negative Labs 2/17: K 3.9, creatinine 1.34, digoxin 0.7, hemoglobin 12.5 Labs 3/17: K 3.9, creatinine 1.25, hemoglobin 10 Labs 4/17: K 4.2, creatinine 1.15 Labs 5/17: K 3.8, creatinine 1.11, HCT 31.7 Labs 6/17: K 5.1, creatinine 0.98, BNP 45  ROS: All systems negative except as listed in HPI, PMH and Problem List.  SH:  Social History   Social History  . Marital status: Married    Spouse name: N/A  . Number of children: N/A  . Years of education: N/A   Occupational History  . Not on file.   Social History Main Topics  . Smoking status: Never Smoker  . Smokeless tobacco: Never Used  . Alcohol use No  . Drug use: No  . Sexual activity: Yes    Birth control/ protection: None   Other Topics Concern  . Not on file   Social History Narrative  . No narrative on file    FH:  Family History  Problem Relation Age of Onset  . Pulmonary fibrosis Mother   . Hypertension Mother   . Atrial fibrillation Mother   . Hypertension Father   . Diabetes Mellitus II Father   . Kidney disease Father   . Diabetes Mellitus II Brother   . Hypertension Brother   . Atrial fibrillation Maternal Grandmother   . Hypertension Maternal Grandmother   . Stroke Maternal Grandmother     Past Medical History:  Diagnosis Date  . Anemia   . CHF (congestive heart failure) (HCC)   . Depression   .  Diabetes mellitus without complication (HCC)   . Fibromyalgia   . Hyperlipidemia   . Hypertension   . Hypothyroidism     Current Outpatient Prescriptions  Medication Sig Dispense Refill  . aspirin 81 MG tablet Take 81 mg by mouth daily.    Marland Kitchen atorvastatin (LIPITOR) 10 MG tablet Take 10 mg by mouth daily.    . carvedilol (COREG) 12.5 MG tablet Take 1.5 tablets (18.75 mg total) by mouth 2 (two) times daily with a meal. 270 tablet 3  . cephALEXin (KEFLEX)  500 MG capsule Take 1 capsule (500 mg total) by mouth 3 (three) times daily. 24 capsule 0  . cyclobenzaprine (FLEXERIL) 10 MG tablet Take 10 mg by mouth 2 (two) times daily as needed for muscle spasms (pt takes qhs and PRN).     Marland Kitchen FLUoxetine (PROZAC) 20 MG tablet Take 20 mg by mouth daily.    . furosemide (LASIX) 40 MG tablet Take 1 tablet (40 mg total) by mouth daily. 60 tablet 3  . gabapentin (NEURONTIN) 600 MG tablet Take 300-600 mg by mouth 3 (three) times daily as needed (for fibromyalgia). Take 1/2 tablet (300mg ) by mouth in the morning, and afternonn, and 1 tablet (600mg ) at bedtime    . glimepiride (AMARYL) 2 MG tablet Take 2 mg by mouth daily as needed (on average pt takes 3x weekly). Take if BG >160  1  . Iron-FA-B Cmp-C-Biot-Probiotic (FUSION PLUS PO) Take 1 capsule by mouth daily.    . ivabradine (CORLANOR) 5 MG TABS tablet Take 1 tablet (5 mg total) by mouth 2 (two) times daily with a meal. 60 tablet 6  . levothyroxine (SYNTHROID, LEVOTHROID) 88 MCG tablet Take 88 mcg by mouth daily before breakfast.    . Liraglutide (VICTOZA) 18 MG/3ML SOPN Inject 1.8 mg into the skin daily.    . metFORMIN (GLUCOPHAGE) 1000 MG tablet Take 1 tablet (1,000 mg total) by mouth 2 (two) times daily with a meal.    . Methylcellulose, Laxative, (MIRAFIBER) 500 MG TABS Take 500 mg by mouth daily.    . Multiple Vitamin (MULTIVITAMIN WITH MINERALS) TABS tablet Take 1 tablet by mouth daily.    Marland Kitchen NOVOTWIST 32G X 5 MM MISC Use as directed with Victozia.  2  . ONETOUCH VERIO test strip Check blood glucose once daily  2  . sacubitril-valsartan (ENTRESTO) 24-26 MG Take 1 tablet by mouth 2 (two) times daily. 60 tablet 6  . spironolactone (ALDACTONE) 25 MG tablet Take 0.5 tablets (12.5 mg total) by mouth daily. 45 tablet 3  . traMADol (ULTRAM) 50 MG tablet Take 50 mg by mouth every 6 (six) hours as needed for moderate pain (pt takes qhs and PRN).      No current facility-administered medications for this encounter.      Vitals:   06/12/16 1421  BP: 106/60  Pulse: 69  SpO2: 97%  Weight: 169 lb (76.7 kg)    PHYSICAL EXAM:  General:  NAD.  HEENT: normal Neck: supple. JVP 7 cm. Carotids 2+ bilaterally; no bruits. No lymphadenopathy or thryomegaly appreciated. Cor: PMI normal. Regular rate & rhythm. No rubs, gallops or murmurs. Lungs: clear Abdomen: soft, nontender, nondistended. No hepatosplenomegaly. No bruits or masses. Good bowel sounds. Extremities: no cyanosis, clubbing, rash, edema Neuro: alert & orientedx3, cranial nerves grossly intact. Moves all 4 extremities w/o difficulty. Affect pleasant.  ASSESSMENT & PLAN: 1. Chronic systolic CHF: 09/15/2015 ECHO EF 20%. Had cMRI EF 20-25%, RV mod dilated.  cMRI (11/16) showed an area  of mid-wall septal late gadolinium enhancement. NICM perhaps related to HTN versus viral myocarditis. SPEP negative.  09/16/2015 RHC/LHC coronaries ok, low cardiac index 1.7.  Repeat echo in 5/17 showed some improvement, EF 35-40%.  NYHA class II symptoms now.  She is not volume overloaded on exam today.  - Continue Lasix 40 mg daily.   - Continue spironolactone 12.5 daily, will not increase with hyperkalemia on spironolactone 25 daily.  - Increase Coreg to 18.75 mg bid.  - Continue Entresto 24/26 bid. - Can continue ivabradine to 5 mg bid.  - EF out of range for ICD at this point.   2. Fibromyalgia: Disabled since 1996   Followup in 3 months.   Marca AnconaDalton McLean 06/13/2016

## 2016-06-14 ENCOUNTER — Other Ambulatory Visit (HOSPITAL_COMMUNITY): Payer: Self-pay | Admitting: Cardiology

## 2016-07-06 ENCOUNTER — Other Ambulatory Visit (HOSPITAL_COMMUNITY): Payer: Self-pay | Admitting: *Deleted

## 2016-07-06 DIAGNOSIS — I5022 Chronic systolic (congestive) heart failure: Secondary | ICD-10-CM

## 2016-08-01 ENCOUNTER — Telehealth (HOSPITAL_COMMUNITY): Payer: Self-pay

## 2016-08-01 DIAGNOSIS — I5022 Chronic systolic (congestive) heart failure: Secondary | ICD-10-CM

## 2016-08-01 MED ORDER — IVABRADINE HCL 5 MG PO TABS: 5.0000 mg | ORAL_TABLET | Freq: Two times a day (BID) | ORAL | 6 refills | Status: DC

## 2016-08-01 NOTE — Telephone Encounter (Signed)
Patient called CHF clinic triage line to ask for corlanor to be sent to [facility] in [place] as her mail order pharm does not accept coupon. Sent to preferred pharmacy electronically. Also states she has left VMs on refill line x 2 days with no response.  No documentation in chart stating any calls were received, will follow up.  Ave Filter, RN

## 2016-08-02 ENCOUNTER — Other Ambulatory Visit (HOSPITAL_COMMUNITY): Payer: Self-pay | Admitting: *Deleted

## 2016-08-02 DIAGNOSIS — I5022 Chronic systolic (congestive) heart failure: Secondary | ICD-10-CM

## 2016-08-07 ENCOUNTER — Other Ambulatory Visit: Payer: Self-pay | Admitting: Obstetrics & Gynecology

## 2016-08-08 LAB — CYTOLOGY - PAP

## 2016-08-15 ENCOUNTER — Telehealth (HOSPITAL_COMMUNITY): Payer: Self-pay | Admitting: *Deleted

## 2016-08-15 MED ORDER — FUROSEMIDE 40 MG PO TABS: 40.0000 mg | ORAL_TABLET | Freq: Every day | ORAL | 6 refills | Status: DC

## 2016-08-15 NOTE — Addendum Note (Signed)
Addended by: Noralee Space on: 08/15/2016 11:49 AM   Modules accepted: Orders

## 2016-08-15 NOTE — Telephone Encounter (Signed)
Pt called to report she has been having increased SOB and abd distention since Sunday.  She states her wt is stable but she has been feeling more SOB w/exertion and her abd is larger.  She has been taking her meds as prescribed and has been drinking more fluid than usual.  We discussed fluid/salt restrictions.  Discussed with Otilio Saber, PA he advises pt increase Lasix to 40 mg BID for 2 days if not improving to call us back.  Spoke w/pt, she is aware, agreeable and verbalizes understanding, if not better she will call us back.

## 2016-08-20 ENCOUNTER — Telehealth: Payer: Self-pay

## 2016-08-20 NOTE — Telephone Encounter (Signed)
Call to patient regarding appointment for BeAT-HF Screening. Patient states her shortness of breath is better after following Heart Failure Clinic's recommendation of increasing Lasix for 2 days. She states she is interested in Heart Failure Research and appointment schedule for Thursday 10/26 @ 1:30. Very pleasant.

## 2016-08-23 DIAGNOSIS — Z006 Encounter for examination for normal comparison and control in clinical research program: Secondary | ICD-10-CM

## 2016-08-23 NOTE — Progress Notes (Signed)
Patient present today to discuss Heart Failure Research studies. She states she is doing "fairly" well but wonders if she could be better. At this point, due to protocol exclusions, she is only applicable for the BeAT-HF Study. We discussed that her EF is borderline as the protocol requires 35% or less.  Questions answered, and patient see's Dr. Shirlee Latch in a couple of weeks and will discuss Research with him. Very pleasant.

## 2016-09-18 ENCOUNTER — Ambulatory Visit (HOSPITAL_COMMUNITY)
Admission: RE | Admit: 2016-09-18 | Discharge: 2016-09-18 | Disposition: A | Discharge status: Home / Self Care | Payer: Managed Care, Other (non HMO) | Source: Ambulatory Visit | Attending: Cardiology | Admitting: Cardiology

## 2016-09-18 VITALS — BP 124/76 | HR 80 | Wt 172.0 lb

## 2016-09-18 DIAGNOSIS — I5022 Chronic systolic (congestive) heart failure: Secondary | ICD-10-CM | POA: Diagnosis not present

## 2016-09-18 LAB — BASIC METABOLIC PANEL
Anion gap: 10 (ref 5–15)
BUN: 14 mg/dL (ref 6–20)
CO2: 24 mmol/L (ref 22–32)
Calcium: 9.6 mg/dL (ref 8.9–10.3)
Chloride: 100 mmol/L — ABNORMAL LOW (ref 101–111)
Creatinine, Ser: 1.11 mg/dL — ABNORMAL HIGH (ref 0.44–1.00)
GFR calc Af Amer: >60 mL/min (ref >60)
GFR calc non Af Amer: 56 mL/min — ABNORMAL LOW (ref >60)
Glucose, Bld: 193 mg/dL — ABNORMAL HIGH (ref 65–99)
Potassium: 4.1 mmol/L (ref 3.5–5.1)
Sodium: 134 mmol/L — ABNORMAL LOW (ref 135–145)

## 2016-09-18 LAB — BRAIN NATRIURETIC PEPTIDE: B Natriuretic Peptide: 23.3 pg/mL (ref 0.0–100.0)

## 2016-09-18 MED ORDER — SACUBITRIL-VALSARTAN 49-51 MG PO TABS: 1.0000 | ORAL_TABLET | Freq: Two times a day (BID) | ORAL | 3 refills | Status: DC

## 2016-09-18 NOTE — Patient Instructions (Signed)
Increase Entresto dose to 49/51 mg (1 Tab) Two times Daily  Labs today (will call for abnormal results, otherwise no news is good news)  Labs in 10 days  Echo and CPX have been ordered for you  Follow up in 3 months

## 2016-09-19 NOTE — Progress Notes (Signed)
Patient ID: Robin Owen, female   DOB: 30-Sep-1961, 55 y.o.   MRN: [id number] PCP: Dr Altheimer Cardiology: Dr. Shirlee Latch  HPI: Beckey is a 55 year old Caucasian female with past medical history of HTN, HLD, DMII, hypothyroidism and fibromyalgia (on disability).   Admitted 11/16 through 09/17/15 with increased dyspnea. CXR with pulmonary edema. This prompted and ECHO that showed reduced EF 20%. RHC/LHC showed normal coronaries and reduced cardiac index. Cardiac MRI showed some mid-wall late gadolinium enhancement in the septum.  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 She has been lisinopril for some time. Started on coreg and lasix.  Discharge weight was 163 pounds.   She developed AKI and hyperkalemia in the setting of high dose Ibuprofen.  I asked her to stop this and started her on tramadol as needed instead.    Last echo in 5/17 showed some improvement, EF up to 35-40%.     She is stable symptomatically.  She is short of breath walking up steps.  She does ok on flat ground. No orthopnea/PND.  No lightheadedness/syncope.   Test/Procedures  09/15/2015 ECHO EF 20% MV mild regurgitation TV mild regurgitation  09/19/2015 CMRI 1. Severely dilated left ventricle with normal wall thickness and severe systolic dysfunction (LVEF 20-25%) with global hypokinesis and akinesis of the entire septal wall and diffuse mid wall late gadolinium enhancement. 2. Moderate RV dilatation with mild moderate systolic dysfunction. 3. Moderate left and right atrial dilatation. 4. Mild to moderate mitral and mild tricuspid regurgitation. 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  RHC/LHC 09/16/2015  RA 7 PCWP 29 CO 3.11 CI 1.7  Normal Cors  Echo (5/17) with EF 35-40%.   Labs 09/15/2015: BNP 2354 Labs 09/16/2015: TSH 1.99 Labs 09/17/2015: K 3.8 Creatinine 0.91 Labs 09/29/2015: K 4.5 Creatinine 1.09 Labs 10/05/2015: K 5.3 Creatinine 1.21, LDL  74, HCT 37.2 Labs 1/17: SPEP negative Labs 2/17: K 3.9, creatinine 1.34, digoxin 0.7, hemoglobin 12.5 Labs 3/17: K 3.9, creatinine 1.25, hemoglobin 10 Labs 4/17: K 4.2, creatinine 1.15 Labs 5/17: K 3.8, creatinine 1.11, HCT 31.7 Labs 6/17: K 5.1, creatinine 0.98, BNP 45  ROS: All systems negative except as listed in HPI, PMH and Problem List.  SH:  Social History   Social History  . Marital status: Married    Spouse name: N/A  . Number of children: N/A  . Years of education: N/A   Occupational History  . Not on file.   Social History Main Topics  . Smoking status: Never Smoker  . Smokeless tobacco: Never Used  . Alcohol use No  . Drug use: No  . Sexual activity: Yes    Birth control/ protection: None   Other Topics Concern  . Not on file   Social History Narrative  . No narrative on file    FH:  Family History  Problem Relation Age of Onset  . Pulmonary fibrosis Mother   . Hypertension Mother   . Atrial fibrillation Mother   . Hypertension Father   . Diabetes Mellitus II Father   . Kidney disease Father   . Diabetes Mellitus II Brother   . Hypertension Brother   . Atrial fibrillation Maternal Grandmother   . Hypertension Maternal Grandmother   . Stroke Maternal Grandmother     Past Medical History:  Diagnosis Date  . Anemia   . CHF (congestive heart failure) (HCC)   . Depression   . Diabetes mellitus without complication (HCC)   . Fibromyalgia   . Hyperlipidemia   .  Hypertension   . Hypothyroidism     Current Outpatient Prescriptions  Medication Sig Dispense Refill  . aspirin 81 MG tablet Take 81 mg by mouth daily.    Marland Kitchen. atorvastatin (LIPITOR) 10 MG tablet Take 10 mg by mouth daily.    . carvedilol (COREG) 12.5 MG tablet Take 1.5 tablets (18.75 mg total) by mouth 2 (two) times daily with a meal. 270 tablet 3  . cyclobenzaprine (FLEXERIL) 10 MG tablet Take 10 mg by mouth 2 (two) times daily as needed for muscle spasms (pt takes qhs and PRN).     Marland Kitchen.  FLUoxetine (PROZAC) 20 MG tablet Take 20 mg by mouth daily.    . furosemide (LASIX) 40 MG tablet Take 1 tablet (40 mg total) by mouth daily. take 1 extra tab as needed 45 tablet 6  . gabapentin (NEURONTIN) 600 MG tablet Take 300-600 mg by mouth 3 (three) times daily as needed (for fibromyalgia). Take 1/2 tablet (300mg ) by mouth in the morning, and afternonn, and 1 tablet (600mg ) at bedtime    . glimepiride (AMARYL) 2 MG tablet Take 2 mg by mouth daily as needed (on average pt takes 3x weekly). Take if BG >160  1  . Iron-FA-B Cmp-C-Biot-Probiotic (FUSION PLUS PO) Take 1 capsule by mouth daily.    . ivabradine (CORLANOR) 5 MG TABS tablet Take 1 tablet (5 mg total) by mouth 2 (two) times daily with a meal. 60 tablet 6  . levothyroxine (SYNTHROID, LEVOTHROID) 88 MCG tablet Take 88 mcg by mouth daily before breakfast.    . Liraglutide (VICTOZA) 18 MG/3ML SOPN Inject 1.8 mg into the skin daily.    . metFORMIN (GLUCOPHAGE) 1000 MG tablet Take 1 tablet (1,000 mg total) by mouth 2 (two) times daily with a meal.    . Methylcellulose, Laxative, (MIRAFIBER) 500 MG TABS Take 500 mg by mouth daily.    . Multiple Vitamin (MULTIVITAMIN WITH MINERALS) TABS tablet Take 1 tablet by mouth daily.    Marland Kitchen. NOVOTWIST 32G X 5 MM MISC Use as directed with Victozia.  2  . ONETOUCH VERIO test strip Check blood glucose once daily  2  . spironolactone (ALDACTONE) 25 MG tablet Take 0.5 tablets (12.5 mg total) by mouth daily. 45 tablet 3  . traMADol (ULTRAM) 50 MG tablet Take 50 mg by mouth every 6 (six) hours as needed for moderate pain (pt takes qhs and PRN).     Marland Kitchen. trimethoprim (TRIMPEX) 100 MG tablet Take 100 mg by mouth at bedtime.    . sacubitril-valsartan (ENTRESTO) 49-51 MG Take 1 tablet by mouth 2 (two) times daily. 60 tablet 3   No current facility-administered medications for this encounter.     Vitals:   09/18/16 1239  BP: 124/76  Pulse: 80  SpO2: 100%  Weight: 172 lb (78 kg)    PHYSICAL EXAM:  General:  NAD.   HEENT: normal Neck: supple. JVP 7 cm. Carotids 2+ bilaterally; no bruits. No lymphadenopathy or thryomegaly appreciated. Cor: PMI normal. Regular rate & rhythm. No rubs, gallops or murmurs. Lungs: clear Abdomen: soft, nontender, nondistended. No hepatosplenomegaly. No bruits or masses. Good bowel sounds. Extremities: no cyanosis, clubbing, rash, edema Neuro: alert & orientedx3, cranial nerves grossly intact. Moves all 4 extremities w/o difficulty. Affect pleasant.  ASSESSMENT & PLAN: 1. Chronic systolic CHF: 09/15/2015 ECHO EF 20%. Had cMRI EF 20-25%, RV mod dilated.  cMRI (11/16) showed an area of mid-wall septal late gadolinium enhancement. NICM perhaps related to HTN versus viral myocarditis. SPEP negative.  09/16/2015 RHC/LHC coronaries ok, low cardiac index 1.7.  Repeat echo in 5/17 showed some improvement, EF 35-40%.  NYHA class II symptoms now.  She is not volume overloaded on exam today.  - Continue Lasix 40 mg daily.   - Continue spironolactone 12.5 daily, will not increase with hyperkalemia on spironolactone 25 daily.  - Continue Coreg 18.75 mg bid.  - Increase Entresto to 49/51 bid. BMET/BNP today and BMET in 10 days.  - Can continue ivabradine 5 mg bid.  - EF out of range for ICD at this point.  Repeat echo next month.  - I will arrange for CPX.  2. Fibromyalgia: Disabled since 1996   Followup in 3 months.   Marca Ancona 09/19/2016

## 2016-09-28 ENCOUNTER — Ambulatory Visit (HOSPITAL_COMMUNITY)
Admission: RE | Admit: 2016-09-28 | Discharge: 2016-09-28 | Disposition: A | Discharge status: Home / Self Care | Payer: Managed Care, Other (non HMO) | Source: Ambulatory Visit | Attending: Internal Medicine | Admitting: Internal Medicine

## 2016-09-28 DIAGNOSIS — I5022 Chronic systolic (congestive) heart failure: Secondary | ICD-10-CM | POA: Insufficient documentation

## 2016-09-28 LAB — BASIC METABOLIC PANEL
Anion gap: 9 (ref 5–15)
BUN: 14 mg/dL (ref 6–20)
CO2: 27 mmol/L (ref 22–32)
Calcium: 9.7 mg/dL (ref 8.9–10.3)
Chloride: 98 mmol/L — ABNORMAL LOW (ref 101–111)
Creatinine, Ser: 1.17 mg/dL — ABNORMAL HIGH (ref 0.44–1.00)
GFR calc Af Amer: >60 mL/min (ref >60)
GFR calc non Af Amer: 53 mL/min — ABNORMAL LOW (ref >60)
Glucose, Bld: 165 mg/dL — ABNORMAL HIGH (ref 65–99)
Potassium: 5 mmol/L (ref 3.5–5.1)
Sodium: 134 mmol/L — ABNORMAL LOW (ref 135–145)

## 2016-09-28 LAB — BRAIN NATRIURETIC PEPTIDE: B Natriuretic Peptide: 34.8 pg/mL (ref 0.0–100.0)

## 2016-10-02 ENCOUNTER — Ambulatory Visit (HOSPITAL_COMMUNITY)
Admission: RE | Admit: 2016-10-02 | Discharge: 2016-10-02 | Disposition: A | Discharge status: Home / Self Care | Payer: Managed Care, Other (non HMO) | Source: Ambulatory Visit | Attending: Internal Medicine | Admitting: Internal Medicine

## 2016-10-02 DIAGNOSIS — I10 Essential (primary) hypertension: Secondary | ICD-10-CM | POA: Insufficient documentation

## 2016-10-02 DIAGNOSIS — E119 Type 2 diabetes mellitus without complications: Secondary | ICD-10-CM | POA: Insufficient documentation

## 2016-10-02 DIAGNOSIS — I5022 Chronic systolic (congestive) heart failure: Secondary | ICD-10-CM | POA: Insufficient documentation

## 2016-10-02 NOTE — Progress Notes (Signed)
  Echocardiogram 2D Echocardiogram has been performed.  Robin Owen 10/02/2016, 2:32 PM

## 2016-10-09 ENCOUNTER — Ambulatory Visit (HOSPITAL_COMMUNITY): Payer: Managed Care, Other (non HMO) | Attending: Cardiology

## 2016-10-09 DIAGNOSIS — E669 Obesity, unspecified: Secondary | ICD-10-CM | POA: Diagnosis not present

## 2016-10-09 DIAGNOSIS — I509 Heart failure, unspecified: Secondary | ICD-10-CM | POA: Insufficient documentation

## 2016-10-09 DIAGNOSIS — I5022 Chronic systolic (congestive) heart failure: Secondary | ICD-10-CM

## 2016-10-10 DIAGNOSIS — I5022 Chronic systolic (congestive) heart failure: Secondary | ICD-10-CM | POA: Diagnosis not present

## 2016-10-15 ENCOUNTER — Telehealth (HOSPITAL_COMMUNITY): Payer: Self-pay | Admitting: *Deleted

## 2016-10-15 NOTE — Telephone Encounter (Signed)
Cardiopulmonary exercise test  Order: [id number]  Status:  Final result Visible to patient:  No (Not Released) Dx:  Chronic systolic heart failure (HCC)  Notes Recorded by Suezanne Cheshire, RN on 10/15/2016 at 2:02 PM EST Left message to call back. ------  Notes Recorded by Noralee Space, RN on 10/12/2016 at 3:47 PM EST Left message to call back ------  Notes Recorded by Laurey Morale, MD on 10/10/2016 at 3:03 PM EST Good test, essentially normal functional capacity.

## 2016-10-16 ENCOUNTER — Telehealth (HOSPITAL_COMMUNITY): Payer: Self-pay

## 2016-10-16 NOTE — Telephone Encounter (Signed)
Cardiopulmonary exercise test  Order: [id number]  Status:  Final result Visible to patient:  No (Not Released) Dx:  Chronic systolic heart failure (HCC)  Notes Recorded by Chyrl Civatte, RN on 10/16/2016 at 2:53 PM EST Patient left VM asking for CPX results. Attempted to return call, no answer, left VM to call us back. ------  Notes Recorded by Suezanne Cheshire, RN on 10/15/2016 at 2:02 PM EST Left message to call back. ------  Notes Recorded by Noralee Space, RN on 10/12/2016 at 3:47 PM EST Left message to call back ------  Notes Recorded by Laurey Morale, MD on 10/10/2016 at 3:03 PM EST Good test, essentially normal functional capacity.

## 2016-11-14 NOTE — Addendum Note (Signed)
Encounter addended by: Jacques Earthly, RD on: 11/14/2016 12:11 PM<BR>    Actions taken: Visit Navigator Flowsheet section accepted

## 2016-12-20 IMAGING — CT CT ABD-PELV W/ CM
2 of 5 series · 15 of 46 positions shown, 17 images · IV contrast (ISOVUE)
Comparison: None.

CLINICAL DATA: Fever and chills. Urinary tract infection. Clinical
concern for perinephric abscess.

EXAM:
CT ABDOMEN AND PELVIS WITH CONTRAST
TECHNIQUE: Multidetector CT imaging of the abdomen and pelvis was performed
using the standard protocol following bolus administration of
intravenous contrast.
CONTRAST:  100mL RBTXON-8QQ IOPAMIDOL (RBTXON-8QQ) INJECTION 61%

[Series 2: abd/pel with · axial · 0.74mm/px · z∈[-428,+6]mm · 12 of 99 slices shown, 14 images]
[im 6/99  soft-tissue]
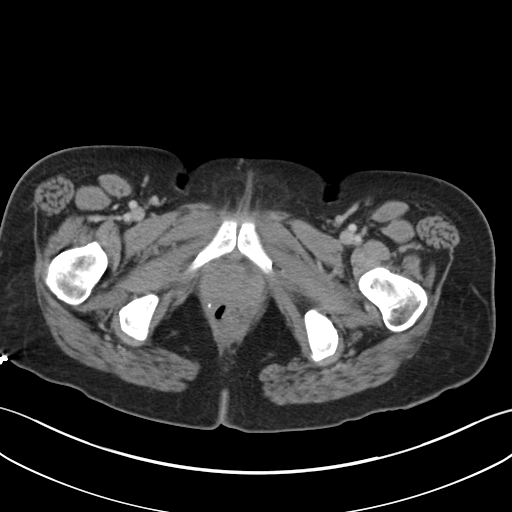
[im 6/99  bone]
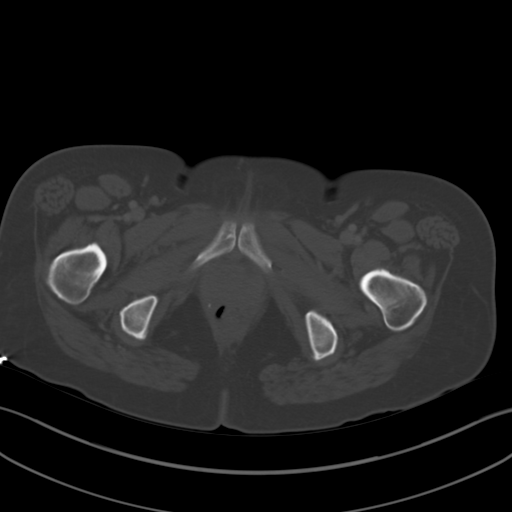
[im 17/99  soft-tissue]
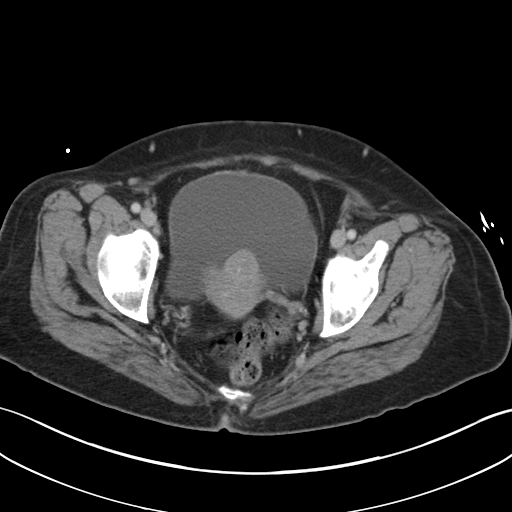
[im 22/99  soft-tissue]
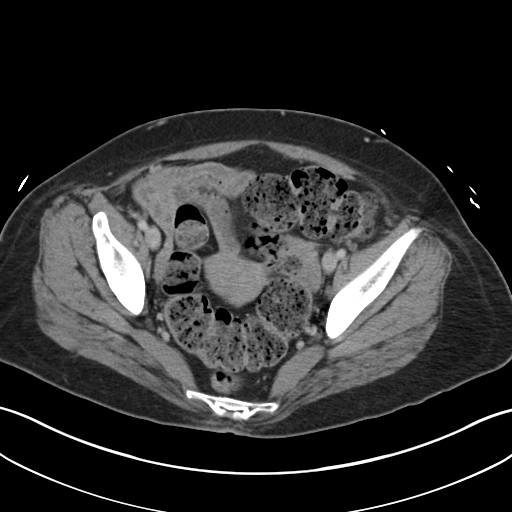
[im 28/99  soft-tissue]
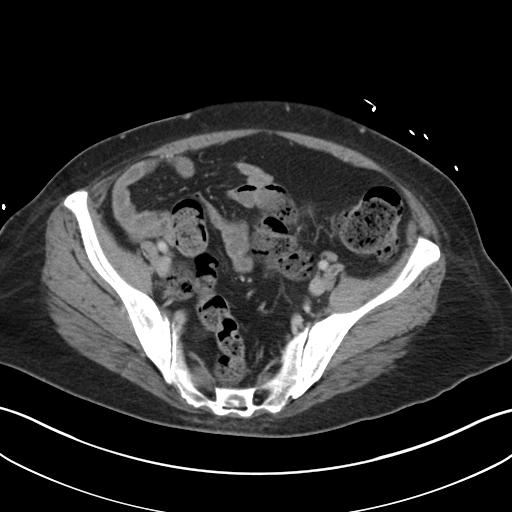
[im 39/99  soft-tissue]
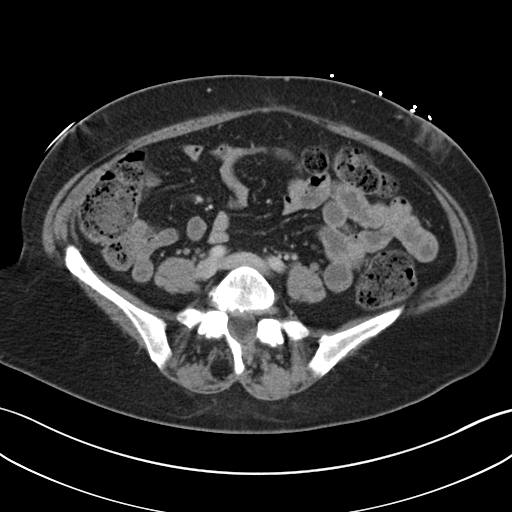
[im 44/99  soft-tissue]
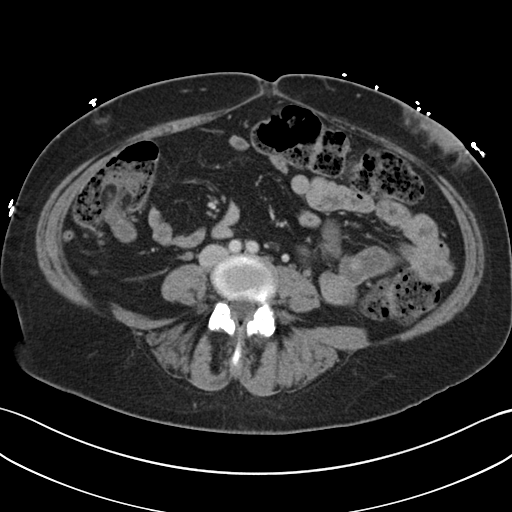
[im 55/99  soft-tissue]
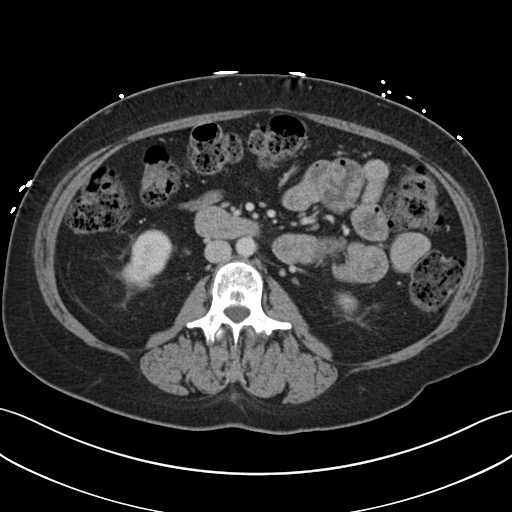
[im 60/99  soft-tissue]
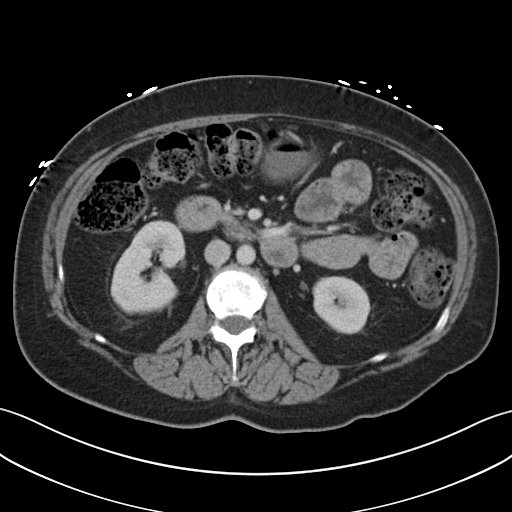
[im 71/99  soft-tissue]
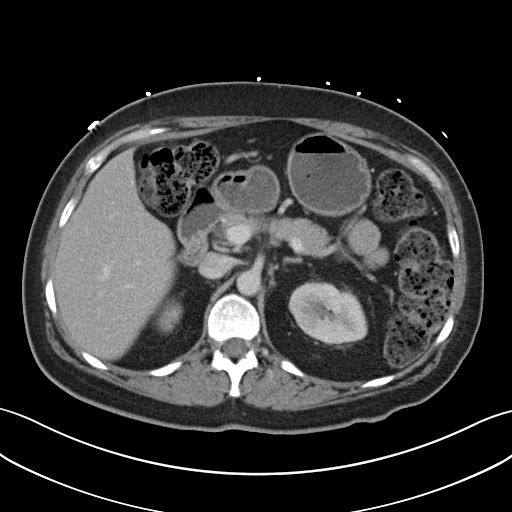
[im 71/99  bone]
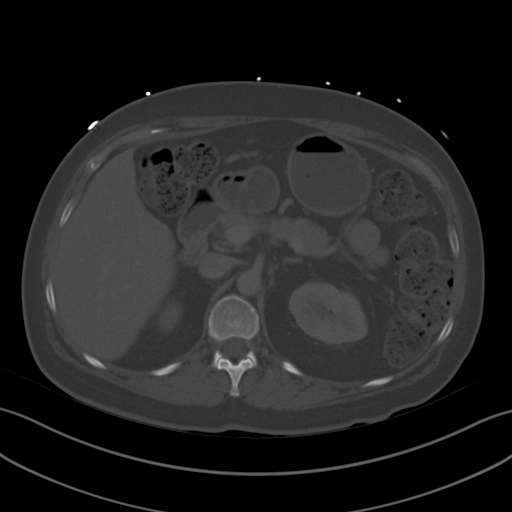
[im 77/99  soft-tissue]
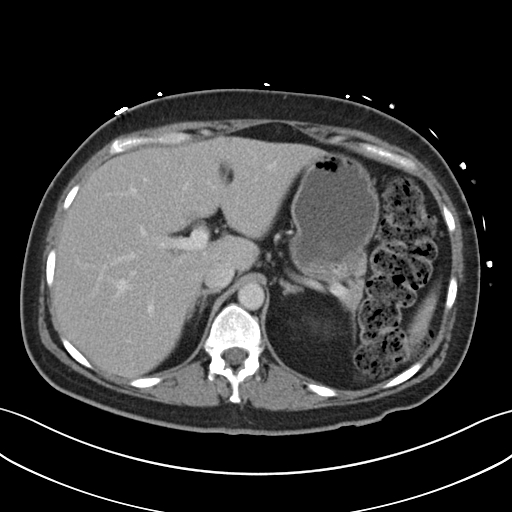
[im 82/99  soft-tissue]
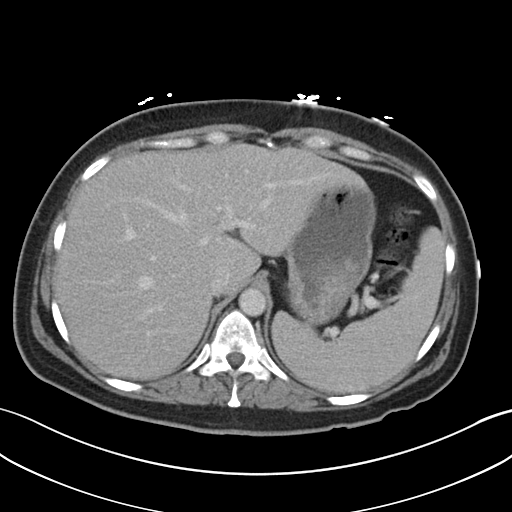
[im 93/99  soft-tissue]
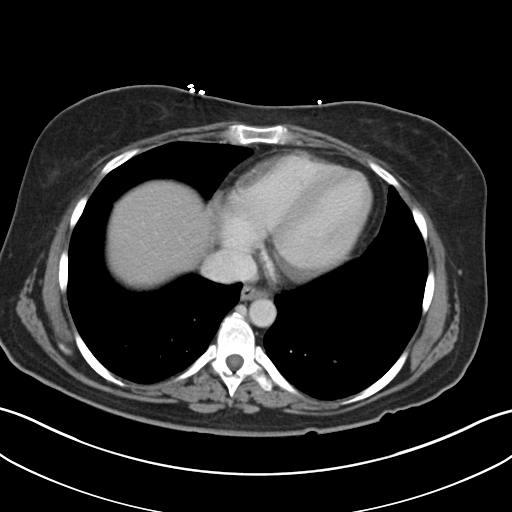

[Series 4: coronal a/|p · coronal · 0.72mm/px · 3 of 129 slices shown]
[im 43/129  soft-tissue]
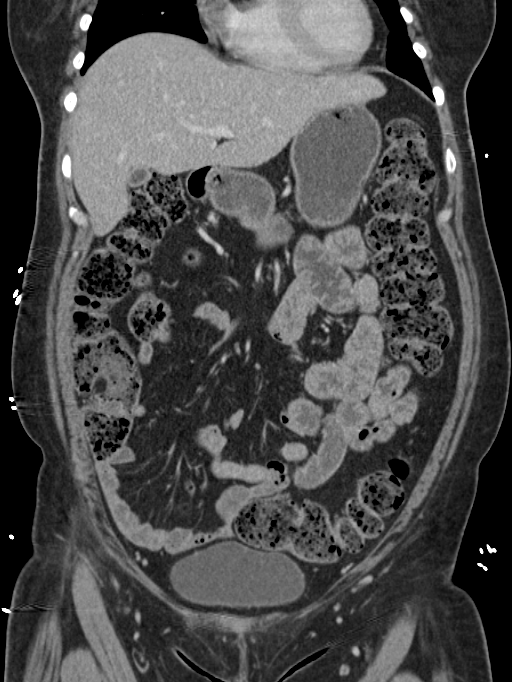
[im 57/129  soft-tissue]
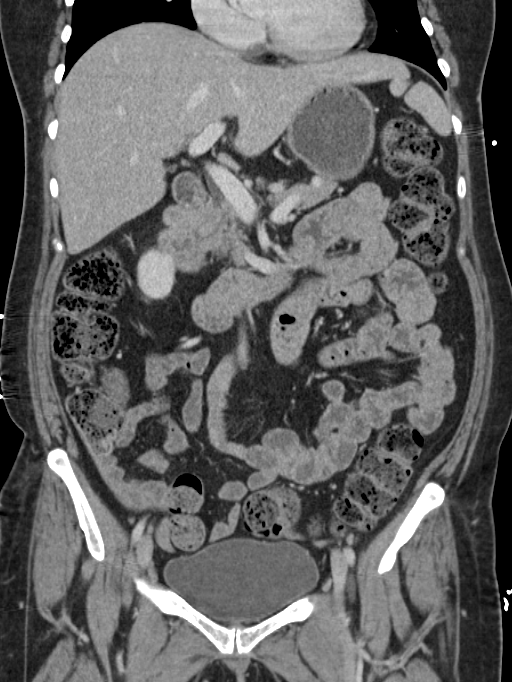
[im 72/129  soft-tissue]
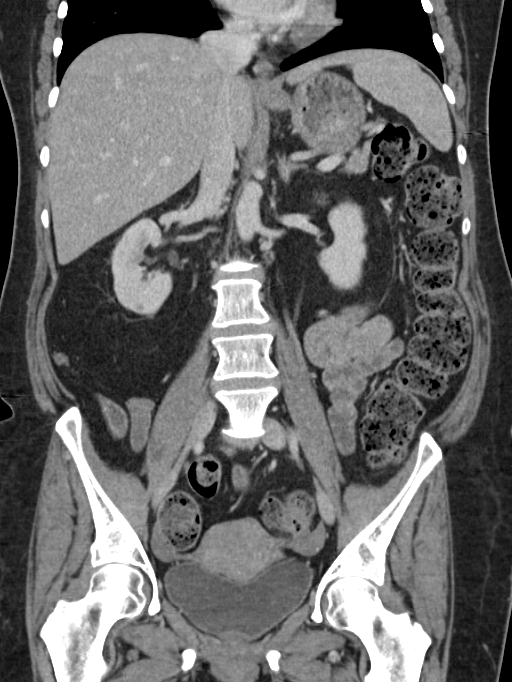

[15 of 46 positions shown; findings below may reference images not displayed]

FINDINGS: Lower chest: Mild dependent atelectasis. No consolidation. No
pleural effusion.

Liver: No focal lesion. Focal fatty infiltration adjacent to the
porta hepatis.

Hepatobiliary: Gallbladder is decompressed. No calcified stone. No
biliary dilatation.

Pancreas: No ductal dilatation or inflammation.

Spleen: Prominent in size 14 cm AP.  No focal lesion.

Adrenal glands: No nodule.

Kidneys: Heterogeneous enhancement of the posterior upper left
kidney, best appreciated on delayed phase. Minimal nonspecific
perinephric stranding, right greater than left. No intrarenal or
perirenal fluid collection or abscess. No hydronephrosis. No focal
renal lesion. Symmetric excretion on delayed phase imaging. Ureters
are decompressed.

Stomach/Bowel: Stomach distended with ingested contents. There are
no dilated or thickened small bowel loops. Moderate to large stool
burden with diffuse colonic tortuosity is suggesting constipation.
No wall thickening. The appendix is normal.

Vascular/Lymphatic: No retroperitoneal adenopathy. Abdominal aorta
is normal in caliber.

Reproductive: Lobular uterine contours with probable fibroids,
scattered calcifications. Ovaries symmetric in size. No adnexal
mass.

Bladder: Physiologically distended. No wall thickening. No
perivesicular inflammation.

Other: No free air, free fluid, or intra-abdominal fluid collection.
Tiny fat containing umbilical hernia. Subcutaneous soft tissue
induration left greater than right anterior abdominal wall, likely
injection related.

Musculoskeletal: There are no acute or suspicious osseous
abnormalities. Degenerative disc disease L5-S1.
IMPRESSION: 1. Heterogeneous left renal parenchyma, may reflect pyelonephritis.
No perinephric or intrarenal abscess.
2. Moderate to large diffuse stool burden in colonic tortuosity, it
would

## 2016-12-21 ENCOUNTER — Ambulatory Visit (HOSPITAL_COMMUNITY)
Admission: RE | Admit: 2016-12-21 | Discharge: 2016-12-21 | Disposition: A | Discharge status: Home / Self Care | Payer: Managed Care, Other (non HMO) | Source: Ambulatory Visit | Attending: Cardiology | Admitting: Cardiology

## 2016-12-21 ENCOUNTER — Encounter (HOSPITAL_COMMUNITY): Payer: Self-pay

## 2016-12-21 VITALS — BP 118/82 | HR 75 | Wt 178.8 lb

## 2016-12-21 DIAGNOSIS — I5022 Chronic systolic (congestive) heart failure: Secondary | ICD-10-CM | POA: Insufficient documentation

## 2016-12-21 DIAGNOSIS — F329 Major depressive disorder, single episode, unspecified: Secondary | ICD-10-CM | POA: Diagnosis not present

## 2016-12-21 DIAGNOSIS — E039 Hypothyroidism, unspecified: Secondary | ICD-10-CM | POA: Diagnosis not present

## 2016-12-21 DIAGNOSIS — I429 Cardiomyopathy, unspecified: Secondary | ICD-10-CM | POA: Diagnosis not present

## 2016-12-21 DIAGNOSIS — M797 Fibromyalgia: Secondary | ICD-10-CM | POA: Insufficient documentation

## 2016-12-21 DIAGNOSIS — E785 Hyperlipidemia, unspecified: Secondary | ICD-10-CM | POA: Diagnosis not present

## 2016-12-21 DIAGNOSIS — Z7984 Long term (current) use of oral hypoglycemic drugs: Secondary | ICD-10-CM | POA: Diagnosis not present

## 2016-12-21 DIAGNOSIS — N179 Acute kidney failure, unspecified: Secondary | ICD-10-CM | POA: Diagnosis not present

## 2016-12-21 DIAGNOSIS — E875 Hyperkalemia: Secondary | ICD-10-CM | POA: Diagnosis not present

## 2016-12-21 DIAGNOSIS — Z7982 Long term (current) use of aspirin: Secondary | ICD-10-CM | POA: Diagnosis not present

## 2016-12-21 DIAGNOSIS — I110 Hypertensive heart disease with heart failure: Secondary | ICD-10-CM | POA: Insufficient documentation

## 2016-12-21 LAB — BASIC METABOLIC PANEL
Anion gap: 10 (ref 5–15)
BUN: 11 mg/dL (ref 6–20)
CO2: 29 mmol/L (ref 22–32)
Calcium: 9.3 mg/dL (ref 8.9–10.3)
Chloride: 98 mmol/L — ABNORMAL LOW (ref 101–111)
Creatinine, Ser: 0.96 mg/dL (ref 0.44–1.00)
GFR calc Af Amer: >60 mL/min (ref >60)
GFR calc non Af Amer: >60 mL/min (ref >60)
Glucose, Bld: 154 mg/dL — ABNORMAL HIGH (ref 65–99)
Potassium: 4.1 mmol/L (ref 3.5–5.1)
Sodium: 137 mmol/L (ref 135–145)

## 2016-12-21 MED ORDER — SACUBITRIL-VALSARTAN 97-103 MG PO TABS: 1.0000 | ORAL_TABLET | Freq: Two times a day (BID) | ORAL | 3 refills | Status: DC

## 2016-12-21 NOTE — Patient Instructions (Signed)
Labs today (will call for abnormal results, otherwise no news is good news)  Increase Entresto to 97-103 mg (1 Tablet) Two times Daily  Labs in 2 weeks (BMET)  Follow up in 6 months

## 2016-12-23 NOTE — Progress Notes (Signed)
Patient ID: Robin Owen, female   DOB: 06-Jul-1961, 56 y.o.   MRN: [id number] PCP: Dr Altheimer Cardiology: Dr. Shirlee Latch  HPI: [name] is a 56 year old Caucasian female with past medical history of HTN, HLD, DMII, hypothyroidism and fibromyalgia (on disability).   Admitted 11/16 through 09/17/15 with increased dyspnea. CXR with pulmonary edema. This prompted and ECHO that showed reduced EF 20%. RHC/LHC showed normal coronaries and reduced cardiac index. Cardiac MRI showed some mid-wall late gadolinium enhancement in the septum.  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 She has been lisinopril for some time. Started on coreg and lasix.  Discharge weight was 163 pounds.   She developed AKI and hyperkalemia in the setting of high dose Ibuprofen.  I asked her to stop this and started her on tramadol as needed instead.    Echo was done today and reviewed.  EF stable at 35-40%.      She is stable symptomatically.  She is short of breath walking up steps.  She does ok on flat ground. No orthopnea/PND.  No lightheadedness/syncope.  No chest pain.  Weight stable.   Test/Procedures  09/15/2015 ECHO EF 20% MV mild regurgitation TV mild regurgitation  09/19/2015 CMRI 1. Severely dilated left ventricle with normal wall thickness and severe systolic dysfunction (LVEF 20-25%) with global hypokinesis and akinesis of the entire septal wall and diffuse mid wall late gadolinium enhancement. 2. Moderate RV dilatation with mild moderate systolic dysfunction. 3. Moderate left and right atrial dilatation. 4. Mild to moderate mitral and mild tricuspid regurgitation. 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  RHC/LHC 09/16/2015  RA 7 PCWP 29 CO 3.11 CI 1.7  Normal Cors  Echo (5/17) with EF 35-40%.   Echo (12/17) with EF 35-40%, mild LV dilation.   CPX (12/17) with RER 1.13, peak VO2 20.8, VE/VCO2 slope 30.  Labs 09/15/2015: BNP 2354 Labs  09/16/2015: TSH 1.99 Labs 09/17/2015: K 3.8 Creatinine 0.91 Labs 09/29/2015: K 4.5 Creatinine 1.09 Labs 10/05/2015: K 5.3 Creatinine 1.21, LDL 74, HCT 37.2 Labs 1/17: SPEP negative Labs 2/17: K 3.9, creatinine 1.34, digoxin 0.7, hemoglobin 12.5 Labs 3/17: K 3.9, creatinine 1.25, hemoglobin 10 Labs 4/17: K 4.2, creatinine 1.15 Labs 5/17: K 3.8, creatinine 1.11, HCT 31.7 Labs 6/17: K 5.1, creatinine 0.98, BNP 45 Labs 12/17: K 5, creatinine 1.17, BNP 35  ROS: All systems negative except as listed in HPI, PMH and Problem List.  SH:  Social History   Social History  . Marital status: Married    Spouse name: N/A  . Number of children: N/A  . Years of education: N/A   Occupational History  . Not on file.   Social History Main Topics  . Smoking status: Never Smoker  . Smokeless tobacco: Never Used  . Alcohol use No  . Drug use: No  . Sexual activity: Yes    Birth control/ protection: None   Other Topics Concern  . Not on file   Social History Narrative  . No narrative on file    FH:  Family History  Problem Relation Age of Onset  . Pulmonary fibrosis Mother   . Hypertension Mother   . Atrial fibrillation Mother   . Hypertension Father   . Diabetes Mellitus II Father   . Kidney disease Father   . Diabetes Mellitus II Brother   . Hypertension Brother   . Atrial fibrillation Maternal Grandmother   . Hypertension Maternal Grandmother   . Stroke Maternal Grandmother     Past  Medical History:  Diagnosis Date  . Anemia   . CHF (congestive heart failure) (HCC)   . Depression   . Diabetes mellitus without complication (HCC)   . Fibromyalgia   . Hyperlipidemia   . Hypertension   . Hypothyroidism     Current Outpatient Prescriptions  Medication Sig Dispense Refill  . aspirin 81 MG tablet Take 81 mg by mouth daily.    Marland Kitchen atorvastatin (LIPITOR) 10 MG tablet Take 10 mg by mouth daily.    . carvedilol (COREG) 12.5 MG tablet Take 1.5 tablets (18.75 mg total) by mouth 2  (two) times daily with a meal. 270 tablet 3  . cyclobenzaprine (FLEXERIL) 10 MG tablet Take 10 mg by mouth 2 (two) times daily as needed for muscle spasms (pt takes qhs and PRN).     Marland Kitchen FLUoxetine (PROZAC) 20 MG tablet Take 20 mg by mouth daily.    . furosemide (LASIX) 40 MG tablet Take 1 tablet (40 mg total) by mouth daily. take 1 extra tab as needed 45 tablet 6  . gabapentin (NEURONTIN) 600 MG tablet Take 300-600 mg by mouth 3 (three) times daily as needed (for fibromyalgia). Take 1/2 tablet (300mg ) by mouth in the morning, and afternonn, and 1 tablet (600mg ) at bedtime    . glimepiride (AMARYL) 2 MG tablet Take 2 mg by mouth daily as needed (on average pt takes 3x weekly). Take if BG >160  1  . Iron-FA-B Cmp-C-Biot-Probiotic (FUSION PLUS PO) Take 1 capsule by mouth daily.    . ivabradine (CORLANOR) 5 MG TABS tablet Take 1 tablet (5 mg total) by mouth 2 (two) times daily with a meal. 60 tablet 6  . levothyroxine (SYNTHROID, LEVOTHROID) 88 MCG tablet Take 88 mcg by mouth daily before breakfast.    . Liraglutide (VICTOZA) 18 MG/3ML SOPN Inject 1.8 mg into the skin daily.    . metFORMIN (GLUCOPHAGE) 1000 MG tablet Take 1 tablet (1,000 mg total) by mouth 2 (two) times daily with a meal.    . Methylcellulose, Laxative, (MIRAFIBER) 500 MG TABS Take 500 mg by mouth daily.    . Multiple Vitamin (MULTIVITAMIN WITH MINERALS) TABS tablet Take 1 tablet by mouth daily.    Marland Kitchen NOVOTWIST 32G X 5 MM MISC Use as directed with Victozia.  2  . ONETOUCH VERIO test strip Check blood glucose once daily  2  . spironolactone (ALDACTONE) 25 MG tablet Take 0.5 tablets (12.5 mg total) by mouth daily. 45 tablet 3  . traMADol (ULTRAM) 50 MG tablet Take 50 mg by mouth every 6 (six) hours as needed for moderate pain (pt takes qhs and PRN).     Marland Kitchen trimethoprim (TRIMPEX) 100 MG tablet Take 100 mg by mouth at bedtime.    . sacubitril-valsartan (ENTRESTO) 97-103 MG Take 1 tablet by mouth 2 (two) times daily. 60 tablet 3   No current  facility-administered medications for this encounter.     Vitals:   12/21/16 1354  BP: 118/82  Pulse: 75  SpO2: 98%  Weight: 178 lb 12.8 oz (81.1 kg)    PHYSICAL EXAM:  General:  NAD.  HEENT: normal Neck: supple. JVP 7 cm. Carotids 2+ bilaterally; no bruits. No lymphadenopathy or thryomegaly appreciated. Cor: PMI normal. Regular rate & rhythm. No rubs, gallops or murmurs. Lungs: clear Abdomen: soft, nontender, nondistended. No hepatosplenomegaly. No bruits or masses. Good bowel sounds. Extremities: no cyanosis, clubbing, rash, edema Neuro: alert & orientedx3, cranial nerves grossly intact. Moves all 4 extremities w/o difficulty. Affect pleasant.  ASSESSMENT & PLAN: 1. Chronic systolic CHF: 09/15/2015 ECHO EF 20%. Had cMRI EF 20-25%, RV mod dilated.  cMRI (11/16) showed an area of mid-wall septal late gadolinium enhancement. NICM perhaps related to HTN versus viral myocarditis. SPEP negative.  09/16/2015 RHC/LHC coronaries ok, low cardiac index 1.7.  Repeat echo in 5/17 and again in 12/17 showed some improvement, EF 35-40%.  CPX in 12/17 showed low normal peak VO2 with no clear cardiopulmonary limitation.  NYHA class II symptoms now.  She is not volume overloaded on exam today.  - Continue Lasix 40 mg daily.   - Continue spironolactone 12.5 daily, will not increase with hyperkalemia on spironolactone 25 daily.  - Continue Coreg 18.75 mg bid.  - Increase Entresto to 97/103 bid. BMET today and BMET in 10 days.  - Can continue ivabradine 5 mg bid.  - EF out of range for ICD at this point.   2. Fibromyalgia: Disabled since 1996   Followup in 6 months.   Marca Ancona 12/23/2016

## 2017-01-04 ENCOUNTER — Ambulatory Visit (HOSPITAL_COMMUNITY)
Admission: RE | Admit: 2017-01-04 | Discharge: 2017-01-04 | Disposition: A | Discharge status: Home / Self Care | Payer: Managed Care, Other (non HMO) | Source: Ambulatory Visit | Attending: Cardiology | Admitting: Cardiology

## 2017-01-04 DIAGNOSIS — I5022 Chronic systolic (congestive) heart failure: Secondary | ICD-10-CM | POA: Insufficient documentation

## 2017-01-04 LAB — BASIC METABOLIC PANEL
Anion gap: 10 (ref 5–15)
BUN: 13 mg/dL (ref 6–20)
CO2: 28 mmol/L (ref 22–32)
Calcium: 8.8 mg/dL — ABNORMAL LOW (ref 8.9–10.3)
Chloride: 97 mmol/L — ABNORMAL LOW (ref 101–111)
Creatinine, Ser: 1.07 mg/dL — ABNORMAL HIGH (ref 0.44–1.00)
GFR calc Af Amer: >60 mL/min (ref >60)
GFR calc non Af Amer: 58 mL/min — ABNORMAL LOW (ref >60)
Glucose, Bld: 234 mg/dL — ABNORMAL HIGH (ref 65–99)
Potassium: 3.9 mmol/L (ref 3.5–5.1)
Sodium: 135 mmol/L (ref 135–145)

## 2017-02-22 ENCOUNTER — Other Ambulatory Visit (HOSPITAL_COMMUNITY): Payer: Self-pay | Admitting: Cardiology

## 2017-02-22 DIAGNOSIS — I5022 Chronic systolic (congestive) heart failure: Secondary | ICD-10-CM

## 2017-03-12 ENCOUNTER — Other Ambulatory Visit (HOSPITAL_COMMUNITY): Payer: Self-pay | Admitting: Student

## 2017-04-19 ENCOUNTER — Other Ambulatory Visit (HOSPITAL_COMMUNITY): Payer: Self-pay | Admitting: Cardiology

## 2017-06-03 ENCOUNTER — Ambulatory Visit (HOSPITAL_COMMUNITY)
Admission: RE | Admit: 2017-06-03 | Discharge: 2017-06-03 | Disposition: A | Discharge status: Home / Self Care | Payer: Managed Care, Other (non HMO) | Source: Ambulatory Visit | Attending: Cardiology | Admitting: Cardiology

## 2017-06-03 ENCOUNTER — Encounter (HOSPITAL_COMMUNITY): Payer: Self-pay | Admitting: Cardiology

## 2017-06-03 VITALS — BP 122/72 | HR 68 | Wt 178.2 lb

## 2017-06-03 DIAGNOSIS — Z833 Family history of diabetes mellitus: Secondary | ICD-10-CM | POA: Diagnosis not present

## 2017-06-03 DIAGNOSIS — E785 Hyperlipidemia, unspecified: Secondary | ICD-10-CM | POA: Insufficient documentation

## 2017-06-03 DIAGNOSIS — E039 Hypothyroidism, unspecified: Secondary | ICD-10-CM | POA: Insufficient documentation

## 2017-06-03 DIAGNOSIS — I110 Hypertensive heart disease with heart failure: Secondary | ICD-10-CM | POA: Diagnosis not present

## 2017-06-03 DIAGNOSIS — M797 Fibromyalgia: Secondary | ICD-10-CM | POA: Diagnosis not present

## 2017-06-03 DIAGNOSIS — I5022 Chronic systolic (congestive) heart failure: Secondary | ICD-10-CM

## 2017-06-03 DIAGNOSIS — Z8249 Family history of ischemic heart disease and other diseases of the circulatory system: Secondary | ICD-10-CM | POA: Diagnosis not present

## 2017-06-03 DIAGNOSIS — E119 Type 2 diabetes mellitus without complications: Secondary | ICD-10-CM | POA: Diagnosis not present

## 2017-06-03 DIAGNOSIS — F329 Major depressive disorder, single episode, unspecified: Secondary | ICD-10-CM | POA: Diagnosis not present

## 2017-06-03 DIAGNOSIS — E875 Hyperkalemia: Secondary | ICD-10-CM | POA: Diagnosis not present

## 2017-06-03 DIAGNOSIS — N179 Acute kidney failure, unspecified: Secondary | ICD-10-CM | POA: Diagnosis not present

## 2017-06-03 DIAGNOSIS — D649 Anemia, unspecified: Secondary | ICD-10-CM | POA: Insufficient documentation

## 2017-06-03 DIAGNOSIS — I429 Cardiomyopathy, unspecified: Secondary | ICD-10-CM | POA: Insufficient documentation

## 2017-06-03 DIAGNOSIS — Z7984 Long term (current) use of oral hypoglycemic drugs: Secondary | ICD-10-CM | POA: Insufficient documentation

## 2017-06-03 DIAGNOSIS — Z7982 Long term (current) use of aspirin: Secondary | ICD-10-CM | POA: Diagnosis not present

## 2017-06-03 DIAGNOSIS — Z841 Family history of disorders of kidney and ureter: Secondary | ICD-10-CM | POA: Diagnosis not present

## 2017-06-03 DIAGNOSIS — Z823 Family history of stroke: Secondary | ICD-10-CM | POA: Diagnosis not present

## 2017-06-03 LAB — BASIC METABOLIC PANEL
Anion gap: 10 (ref 5–15)
BUN: 14 mg/dL (ref 6–20)
CO2: 29 mmol/L (ref 22–32)
Calcium: 9.5 mg/dL (ref 8.9–10.3)
Chloride: 100 mmol/L — ABNORMAL LOW (ref 101–111)
Creatinine, Ser: 0.94 mg/dL (ref 0.44–1.00)
GFR calc Af Amer: >60 mL/min (ref >60)
GFR calc non Af Amer: >60 mL/min (ref >60)
Glucose, Bld: 133 mg/dL — ABNORMAL HIGH (ref 65–99)
Potassium: 4.3 mmol/L (ref 3.5–5.1)
Sodium: 139 mmol/L (ref 135–145)

## 2017-06-03 MED ORDER — CARVEDILOL 25 MG PO TABS: 25.0000 mg | ORAL_TABLET | Freq: Two times a day (BID) | ORAL | 3 refills | Status: DC

## 2017-06-03 NOTE — Patient Instructions (Signed)
Increase Carvedilol to 25 mg Twice daily   Lab today  Your physician recommends that you schedule a follow-up appointment in: 3 months

## 2017-06-04 NOTE — Progress Notes (Signed)
Patient ID: Robin Owen, female   DOB: 05-Aug-1961, 56 y.o.   MRN: [id number] PCP: Dr Altheimer Cardiology: Dr. Shirlee Latch  HPI: Saphyre is a 56 year old Caucasian female with past medical history of HTN, HLD, DMII, hypothyroidism and fibromyalgia (on disability).   Admitted 11/16 through 09/17/15 with increased dyspnea. CXR with pulmonary edema. This prompted and ECHO that showed reduced EF 20%. RHC/LHC showed normal coronaries and reduced cardiac index. Cardiac MRI showed some mid-wall late gadolinium enhancement in the septum.  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 She has been lisinopril for some time. Started on coreg and lasix.  Discharge weight was 163 pounds.   She developed AKI and hyperkalemia in the setting of high dose Ibuprofen.  I asked her to stop this and started her on tramadol as needed instead.    Last echo in 12/17 showed EF 35-40%.      She is stable symptomatically.  Weight is unchanged.  Dyspnea with stairs and inclines, no problems walking on flat ground.  No lightheadedness, no chest pain or palpitations . No orthopnea/PND.  She says that blood glucose has been running high.   Test/Procedures  09/15/2015 ECHO EF 20% MV mild regurgitation TV mild regurgitation  09/19/2015 CMRI 1. Severely dilated left ventricle with normal wall thickness and severe systolic dysfunction (LVEF 20-25%) with global hypokinesis and akinesis of the entire septal wall and diffuse mid wall late gadolinium enhancement. 2. Moderate RV dilatation with mild moderate systolic dysfunction. 3. Moderate left and right atrial dilatation. 4. Mild to moderate mitral and mild tricuspid regurgitation. 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  RHC/LHC 09/16/2015  RA 7 PCWP 29 CO 3.11 CI 1.7  Normal Cors  Echo (5/17) with EF 35-40%.   Echo (12/17) with EF 35-40%, mild LV dilation.   CPX (12/17) with RER 1.13, peak VO2 20.8, VE/VCO2  slope 30.  Labs 09/15/2015: BNP 2354 Labs 09/16/2015: TSH 1.99 Labs 09/17/2015: K 3.8 Creatinine 0.91 Labs 09/29/2015: K 4.5 Creatinine 1.09 Labs 10/05/2015: K 5.3 Creatinine 1.21, LDL 74, HCT 37.2 Labs 1/17: SPEP negative Labs 2/17: K 3.9, creatinine 1.34, digoxin 0.7, hemoglobin 12.5 Labs 3/17: K 3.9, creatinine 1.25, hemoglobin 10 Labs 4/17: K 4.2, creatinine 1.15 Labs 5/17: K 3.8, creatinine 1.11, HCT 31.7 Labs 6/17: K 5.1, creatinine 0.98, BNP 45 Labs 12/17: K 5, creatinine 1.17, BNP 35 Labs 3/18: K 3.9, creatinine 1.07  ROS: All systems negative except as listed in HPI, PMH and Problem List.  SH:  Social History   Social History  . Marital status: Married    Spouse name: N/A  . Number of children: N/A  . Years of education: N/A   Occupational History  . Not on file.   Social History Main Topics  . Smoking status: Never Smoker  . Smokeless tobacco: Never Used  . Alcohol use No  . Drug use: No  . Sexual activity: Yes    Birth control/ protection: None   Other Topics Concern  . Not on file   Social History Narrative  . No narrative on file    FH:  Family History  Problem Relation Age of Onset  . Pulmonary fibrosis Mother   . Hypertension Mother   . Atrial fibrillation Mother   . Hypertension Father   . Diabetes Mellitus II Father   . Kidney disease Father   . Diabetes Mellitus II Brother   . Hypertension Brother   . Atrial fibrillation Maternal Grandmother   . Hypertension Maternal Grandmother   .  Stroke Maternal Grandmother     Past Medical History:  Diagnosis Date  . Anemia   . CHF (congestive heart failure) (HCC)   . Depression   . Diabetes mellitus without complication (HCC)   . Fibromyalgia   . Hyperlipidemia   . Hypertension   . Hypothyroidism     Current Outpatient Prescriptions  Medication Sig Dispense Refill  . aspirin 81 MG tablet Take 81 mg by mouth daily.    Marland Kitchen atorvastatin (LIPITOR) 10 MG tablet Take 10 mg by mouth daily.    .  carvedilol (COREG) 25 MG tablet Take 1 tablet (25 mg total) by mouth 2 (two) times daily with a meal. 60 tablet 3  . CORLANOR 5 MG TABS tablet TAKE 1 TABLET (5 MG TOTAL) BY MOUTH 2 (TWO) TIMES DAILY WITH A MEAL. 60 tablet 6  . cyclobenzaprine (FLEXERIL) 10 MG tablet Take 10 mg by mouth 2 (two) times daily as needed for muscle spasms (pt takes qhs and PRN).     Marland Kitchen ENTRESTO 97-103 MG TAKE 1 TABLET BY MOUTH 2 (TWO) TIMES DAILY. 60 tablet 3  . FLUoxetine (PROZAC) 20 MG tablet Take 20 mg by mouth daily.    . furosemide (LASIX) 40 MG tablet TAKE 1 TABLET BY MOUTH EVERY DAY TAKE EXTRA TAB AS NEEDED 45 tablet 6  . gabapentin (NEURONTIN) 600 MG tablet Take 300-600 mg by mouth 3 (three) times daily as needed (for fibromyalgia). Take 1/2 tablet (300mg ) by mouth in the morning, and afternonn, and 1 tablet (600mg ) at bedtime    . glimepiride (AMARYL) 2 MG tablet Take 2 mg by mouth daily as needed (on average pt takes 3x weekly). Take if BG >160  1  . Iron-FA-B Cmp-C-Biot-Probiotic (FUSION PLUS PO) Take 1 capsule by mouth daily.    Marland Kitchen levothyroxine (SYNTHROID, LEVOTHROID) 88 MCG tablet Take 88 mcg by mouth daily before breakfast.    . Liraglutide (VICTOZA) 18 MG/3ML SOPN Inject 1.8 mg into the skin daily.    . metFORMIN (GLUCOPHAGE) 1000 MG tablet Take 1 tablet (1,000 mg total) by mouth 2 (two) times daily with a meal.    . Methylcellulose, Laxative, (MIRAFIBER) 500 MG TABS Take 500 mg by mouth daily.    . Multiple Vitamin (MULTIVITAMIN WITH MINERALS) TABS tablet Take 1 tablet by mouth daily.    Marland Kitchen NOVOTWIST 32G X 5 MM MISC Use as directed with Victozia.  2  . ONETOUCH VERIO test strip Check blood glucose once daily  2  . spironolactone (ALDACTONE) 25 MG tablet Take 0.5 tablets (12.5 mg total) by mouth daily. 45 tablet 3  . traMADol (ULTRAM) 50 MG tablet Take 50 mg by mouth every 6 (six) hours as needed for moderate pain (pt takes qhs and PRN).     Marland Kitchen trimethoprim (TRIMPEX) 100 MG tablet Take 100 mg by mouth at  bedtime.     No current facility-administered medications for this encounter.     Vitals:   06/03/17 1421  BP: 122/72  Pulse: 68  SpO2: 98%  Weight: 178 lb 4 oz (80.9 kg)    PHYSICAL EXAM:  General: NAD Neck: No JVD, no thyromegaly or thyroid nodule.  Lungs: Clear to auscultation bilaterally with normal respiratory effort. CV: Nondisplaced PMI.  Heart regular S1/S2, no S3/S4, no murmur.  No peripheral edema.  No carotid bruit.  Normal pedal pulses.  Abdomen: Soft, nontender, no hepatosplenomegaly, no distention.  Skin: Intact without lesions or rashes.  Neurologic: Alert and oriented x 3.  Psych:  Normal affect. Extremities: No clubbing or cyanosis.  HEENT: Normal.   ASSESSMENT & PLAN: 1. Chronic systolic CHF: 09/15/2015 ECHO EF 20%. Had cMRI EF 20-25%, RV mod dilated.  cMRI (11/16) showed an area of mid-wall septal late gadolinium enhancement. NICM perhaps related to HTN versus viral myocarditis. SPEP negative.  09/16/2015 RHC/LHC coronaries ok, low cardiac index 1.7.  Repeat echo in 5/17 and again in 12/17 showed some improvement, EF 35-40%.  CPX in 12/17 showed low normal peak VO2 with no clear cardiopulmonary limitation.  NYHA class II symptoms now.  She is not volume overloaded on exam today.  - Continue Lasix 40 mg daily.   - Continue spironolactone 12.5 daily, will not increase with hyperkalemia on spironolactone 25 daily.  - Increase Coreg to 25 mg bid.  - Continue Entresto 49/51 bid.  - Can continue ivabradine 5 mg bid.  - EF out of range for ICD at this point, will repeat echo in 12/18.   - BMET today.  2. Fibromyalgia: Disabled since 1996  3. Diabetes: Would consider starting empagliflozin with blood glucose still running high and h/o CHF.   Followup in 3 months.   Marca Ancona 06/04/2017

## 2017-06-23 ENCOUNTER — Other Ambulatory Visit (HOSPITAL_COMMUNITY): Payer: Self-pay | Admitting: Cardiology

## 2017-06-27 ENCOUNTER — Other Ambulatory Visit (HOSPITAL_COMMUNITY): Payer: Self-pay | Admitting: Internal Medicine

## 2017-06-27 ENCOUNTER — Other Ambulatory Visit (HOSPITAL_COMMUNITY): Payer: Self-pay | Admitting: Adult Health

## 2017-06-27 ENCOUNTER — Other Ambulatory Visit (HOSPITAL_COMMUNITY): Payer: Self-pay | Admitting: Cardiology

## 2017-07-03 ENCOUNTER — Other Ambulatory Visit (HOSPITAL_COMMUNITY): Payer: Self-pay

## 2017-07-03 NOTE — Telephone Encounter (Signed)
Called pt and left VM to call PCP for refill on Prozac. If any questions

## 2017-07-04 ENCOUNTER — Other Ambulatory Visit (HOSPITAL_COMMUNITY): Payer: Self-pay

## 2017-07-04 ENCOUNTER — Telehealth (HOSPITAL_COMMUNITY): Payer: Self-pay

## 2017-07-04 NOTE — Telephone Encounter (Signed)
Pt seen PCP 07/04/17, pt was started on Jardiance.

## 2017-07-08 NOTE — Telephone Encounter (Signed)
Spoke w/pt who had med filled with PCP.

## 2017-07-30 ENCOUNTER — Other Ambulatory Visit (HOSPITAL_COMMUNITY): Payer: Self-pay | Admitting: Cardiology

## 2017-07-30 MED ORDER — FUROSEMIDE 40 MG PO TABS: 40.0000 mg | ORAL_TABLET | Freq: Every day | ORAL | 3 refills | Status: DC

## 2017-08-13 ENCOUNTER — Other Ambulatory Visit (HOSPITAL_COMMUNITY): Payer: Self-pay | Admitting: Cardiology

## 2017-09-09 ENCOUNTER — Other Ambulatory Visit (HOSPITAL_COMMUNITY): Payer: Self-pay | Admitting: Cardiology

## 2017-09-10 ENCOUNTER — Ambulatory Visit (HOSPITAL_COMMUNITY)
Admission: RE | Admit: 2017-09-10 | Discharge: 2017-09-10 | Disposition: A | Discharge status: Home / Self Care | Payer: Managed Care, Other (non HMO) | Source: Ambulatory Visit | Attending: Cardiology | Admitting: Cardiology

## 2017-09-10 ENCOUNTER — Other Ambulatory Visit: Payer: Self-pay

## 2017-09-10 ENCOUNTER — Telehealth (HOSPITAL_COMMUNITY): Payer: Self-pay | Admitting: Cardiology

## 2017-09-10 VITALS — BP 120/82 | HR 63 | Wt 170.4 lb

## 2017-09-10 DIAGNOSIS — M797 Fibromyalgia: Secondary | ICD-10-CM | POA: Insufficient documentation

## 2017-09-10 DIAGNOSIS — Z79899 Other long term (current) drug therapy: Secondary | ICD-10-CM | POA: Diagnosis not present

## 2017-09-10 DIAGNOSIS — F329 Major depressive disorder, single episode, unspecified: Secondary | ICD-10-CM | POA: Diagnosis not present

## 2017-09-10 DIAGNOSIS — E119 Type 2 diabetes mellitus without complications: Secondary | ICD-10-CM | POA: Insufficient documentation

## 2017-09-10 DIAGNOSIS — I110 Hypertensive heart disease with heart failure: Secondary | ICD-10-CM | POA: Insufficient documentation

## 2017-09-10 DIAGNOSIS — I429 Cardiomyopathy, unspecified: Secondary | ICD-10-CM | POA: Diagnosis not present

## 2017-09-10 DIAGNOSIS — Z7984 Long term (current) use of oral hypoglycemic drugs: Secondary | ICD-10-CM | POA: Insufficient documentation

## 2017-09-10 DIAGNOSIS — E039 Hypothyroidism, unspecified: Secondary | ICD-10-CM | POA: Insufficient documentation

## 2017-09-10 DIAGNOSIS — E785 Hyperlipidemia, unspecified: Secondary | ICD-10-CM | POA: Insufficient documentation

## 2017-09-10 DIAGNOSIS — Z7982 Long term (current) use of aspirin: Secondary | ICD-10-CM | POA: Diagnosis not present

## 2017-09-10 DIAGNOSIS — I5022 Chronic systolic (congestive) heart failure: Secondary | ICD-10-CM | POA: Diagnosis present

## 2017-09-10 LAB — BASIC METABOLIC PANEL
Anion gap: 11 (ref 5–15)
BUN: 15 mg/dL (ref 6–20)
CO2: 29 mmol/L (ref 22–32)
Calcium: 9.6 mg/dL (ref 8.9–10.3)
Chloride: 97 mmol/L — ABNORMAL LOW (ref 101–111)
Creatinine, Ser: 1.15 mg/dL — ABNORMAL HIGH (ref 0.44–1.00)
GFR calc Af Amer: >60 mL/min (ref >60)
GFR calc non Af Amer: 53 mL/min — ABNORMAL LOW (ref >60)
Glucose, Bld: 226 mg/dL — ABNORMAL HIGH (ref 65–99)
Potassium: 3.9 mmol/L (ref 3.5–5.1)
Sodium: 137 mmol/L (ref 135–145)

## 2017-09-10 MED ORDER — FUROSEMIDE 40 MG PO TABS: 20.0000 mg | ORAL_TABLET | Freq: Every day | ORAL | 3 refills | Status: DC

## 2017-09-10 MED ORDER — SPIRONOLACTONE 25 MG PO TABS: 25.0000 mg | ORAL_TABLET | Freq: Every day | ORAL | 3 refills | Status: DC

## 2017-09-10 NOTE — Patient Instructions (Signed)
Increase Spironolactone 25 mg (1 tab) daily  Decrease Potassium 20 mg (1 tab) daily  Labs drawn today (if we do not call you, then your lab work was stable)   Your physician has requested that you have an echocardiogram. Echocardiography is a painless test that uses sound waves to create images of your heart. It provides your doctor with information about the size and shape of your heart and how well your heart's chambers and valves are working. This procedure takes approximately one hour. There are no restrictions for this procedure.  Your physician recommends that you schedule a follow-up appointment in: 4 months with Dr. Shirlee Latch

## 2017-09-11 NOTE — Progress Notes (Signed)
Patient ID: Lorinda CreedSharon A Ramnauth, female   DOB: 24-Oct-1961, 56 y.o.   MRN: [id number] PCP: Dr Altheimer Cardiology: Dr. Shirlee LatchMcLean  HPI: Jasmine DecemberSharon is a 56 year old Caucasian female with past medical history of HTN, HLD, DMII, hypothyroidism and fibromyalgia (on disability).   Admitted 11/16 through 09/17/15 with increased dyspnea. CXR with pulmonary edema. This prompted and ECHO that showed reduced EF 20%. RHC/LHC showed normal coronaries and reduced cardiac index. Cardiac MRI showed some mid-wall late gadolinium enhancement in the septum.  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 She has been lisinopril for some time. Started on coreg and lasix.  Discharge weight was 163 pounds.   She developed AKI and hyperkalemia in the setting of high dose Ibuprofen.  I asked her to stop this and started her on tramadol as needed instead.    Last echo in 12/17 showed EF 35-40%.      She returns for followup of CHF.  Weight is down 8 lbs.  No dyspnea walking on flat ground.  Mild dyspnea walking up a flight of stairs.  No chest pain.  No orthopnea/PND.  Occasional lightheadedness when she bends over and stands too fast.    Test/Procedures  09/15/2015 ECHO EF 20% MV mild regurgitation TV mild regurgitation  09/19/2015 CMRI 1. Severely dilated left ventricle with normal wall thickness and severe systolic dysfunction (LVEF 20-25%) with global hypokinesis and akinesis of the entire septal wall and diffuse mid wall late gadolinium enhancement. 2. Moderate RV dilatation with mild moderate systolic dysfunction. 3. Moderate left and right atrial dilatation. 4. Mild to moderate mitral and mild tricuspid regurgitation. 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  RHC/LHC 09/16/2015  RA 7 PCWP 29 CO 3.11 CI 1.7  Normal Cors  Echo (5/17) with EF 35-40%.   Echo (12/17) with EF 35-40%, mild LV dilation.   CPX (12/17) with RER 1.13, peak VO2 20.8, VE/VCO2 slope  30.  Labs 09/15/2015: BNP 2354 Labs 09/16/2015: TSH 1.99 Labs 09/17/2015: K 3.8 Creatinine 0.91 Labs 09/29/2015: K 4.5 Creatinine 1.09 Labs 10/05/2015: K 5.3 Creatinine 1.21, LDL 74, HCT 37.2 Labs 1/17: SPEP negative Labs 2/17: K 3.9, creatinine 1.34, digoxin 0.7, hemoglobin 12.5 Labs 3/17: K 3.9, creatinine 1.25, hemoglobin 10 Labs 4/17: K 4.2, creatinine 1.15 Labs 5/17: K 3.8, creatinine 1.11, HCT 31.7 Labs 6/17: K 5.1, creatinine 0.98, BNP 45 Labs 12/17: K 5, creatinine 1.17, BNP 35 Labs 3/18: K 3.9, creatinine 1.07 Labs 8/18: K 4.3, creatinine 0.94  ROS: All systems negative except as listed in HPI, PMH and Problem List.  SH:  Social History   Socioeconomic History  . Marital status: Married    Spouse name: Not on file  . Number of children: Not on file  . Years of education: Not on file  . Highest education level: Not on file  Social Needs  . Financial resource strain: Not on file  . Food insecurity - worry: Not on file  . Food insecurity - inability: Not on file  . Transportation needs - medical: Not on file  . Transportation needs - non-medical: Not on file  Occupational History  . Not on file  Tobacco Use  . Smoking status: Never Smoker  . Smokeless tobacco: Never Used  Substance and Sexual Activity  . Alcohol use: No  . Drug use: No  . Sexual activity: Yes    Birth control/protection: None  Other Topics Concern  . Not on file  Social History Narrative  . Not on file  FH:  Family History  Problem Relation Age of Onset  . Pulmonary fibrosis Mother   . Hypertension Mother   . Atrial fibrillation Mother   . Hypertension Father   . Diabetes Mellitus II Father   . Kidney disease Father   . Diabetes Mellitus II Brother   . Hypertension Brother   . Atrial fibrillation Maternal Grandmother   . Hypertension Maternal Grandmother   . Stroke Maternal Grandmother     Past Medical History:  Diagnosis Date  . Anemia   . CHF (congestive heart failure)  (HCC)   . Depression   . Diabetes mellitus without complication (HCC)   . Fibromyalgia   . Hyperlipidemia   . Hypertension   . Hypothyroidism     Current Outpatient Medications  Medication Sig Dispense Refill  . aspirin 81 MG tablet Take 81 mg by mouth daily.    . carvedilol (COREG) 25 MG tablet Take 1 tablet (25 mg total) by mouth 2 (two) times daily with a meal. 60 tablet 3  . CORLANOR 5 MG TABS tablet TAKE 1 TABLET (5 MG TOTAL) BY MOUTH 2 (TWO) TIMES DAILY WITH A MEAL. 60 tablet 6  . cyclobenzaprine (FLEXERIL) 10 MG tablet Take 10 mg by mouth 2 (two) times daily as needed for muscle spasms (pt takes qhs and PRN).     Marland Kitchen empagliflozin (JARDIANCE) 10 MG TABS tablet Take 10 mg by mouth daily.    Marland Kitchen ENTRESTO 97-103 MG TAKE 1 TABLET BY MOUTH TWICE A DAY 60 tablet 0  . FLUoxetine (PROZAC) 20 MG tablet Take 20 mg by mouth daily.    . furosemide (LASIX) 40 MG tablet Take 0.5 tablets (20 mg total) daily by mouth. 30 tablet 3  . gabapentin (NEURONTIN) 600 MG tablet Take 300-600 mg by mouth 3 (three) times daily as needed (for fibromyalgia). Take 1/2 tablet (300mg ) by mouth in the morning, and afternonn, and 1 tablet (600mg ) at bedtime    . Iron-FA-B Cmp-C-Biot-Probiotic (FUSION PLUS PO) Take 1 capsule by mouth daily.    Marland Kitchen levothyroxine (SYNTHROID, LEVOTHROID) 88 MCG tablet Take 88 mcg by mouth daily before breakfast.    . Liraglutide (VICTOZA) 18 MG/3ML SOPN Inject 1.8 mg into the skin daily.    . metFORMIN (GLUCOPHAGE) 1000 MG tablet Take 1 tablet (1,000 mg total) by mouth 2 (two) times daily with a meal.    . Methylcellulose, Laxative, (MIRAFIBER) 500 MG TABS Take 500 mg by mouth daily.    . Multiple Vitamin (MULTIVITAMIN WITH MINERALS) TABS tablet Take 1 tablet by mouth daily.    Marland Kitchen NOVOTWIST 32G X 5 MM MISC Use as directed with Victozia.  2  . ONETOUCH VERIO test strip Check blood glucose once daily  2  . spironolactone (ALDACTONE) 25 MG tablet Take 1 tablet (25 mg total) daily by mouth. 30  tablet 3  . traMADol (ULTRAM) 50 MG tablet Take 50 mg by mouth every 6 (six) hours as needed for moderate pain (pt takes qhs and PRN).     Marland Kitchen atorvastatin (LIPITOR) 10 MG tablet Take 10 mg by mouth daily.     No current facility-administered medications for this encounter.     Vitals:   09/10/17 1357  BP: 120/82  Pulse: 63  SpO2: 97%  Weight: 170 lb 6.4 oz (77.3 kg)    PHYSICAL EXAM: General: NAD Neck: No JVD, no thyromegaly or thyroid nodule.  Lungs: Clear to auscultation bilaterally with normal respiratory effort. CV: Nondisplaced PMI.  Heart regular S1/S2,  no S3/S4, no murmur.  No peripheral edema.  No carotid bruit.  Normal pedal pulses.  Abdomen: Soft, nontender, no hepatosplenomegaly, no distention.  Skin: Intact without lesions or rashes.  Neurologic: Alert and oriented x 3.  Psych: Normal affect. Extremities: No clubbing or cyanosis.  HEENT: Normal.   ASSESSMENT & PLAN: 1. Chronic systolic CHF: 09/15/2015 ECHO EF 20%. Had cMRI EF 20-25%, RV mod dilated.  cMRI (11/16) showed an area of mid-wall septal late gadolinium enhancement. NICM perhaps related to HTN versus viral myocarditis. SPEP negative.  09/16/2015 RHC/LHC coronaries ok, low cardiac index 1.7.  Repeat echo in 5/17 and again in 12/17 showed some improvement, EF 35-40%.  CPX in 12/17 showed low normal peak VO2 with no clear cardiopulmonary limitation.  NYHA class II symptoms now.  She is not volume overloaded.   - Decrease Lasix to 20 mg daily.    - Increase spironolactone to 25 mg daily.  BMET today and in 1 week to follow K.   - Continue Coreg 25 mg bid.  - Continue Entresto 49/51 bid.  - Can continue ivabradine 5 mg bid.  - EF out of range for ICD at this point, will repeat echo in 12/18.   2. Fibromyalgia: Disabled since 1996  3. Diabetes: Continue empagliflozin.   Followup in 4 months.   Marca Ancona 09/11/2017

## 2017-09-11 NOTE — Telephone Encounter (Signed)
User: Trina Ao A Date/time: 09/10/17 2:43 PM  Comment: Called pt and lmsg for her to CB to get scheduled for an echo.   Context:  Outcome: Left Message  Phone number: [phone] Phone Type: Mobile  Comm. type: Telephone Call type: Outgoing  Contact: Marguerite Olea A Relation to patient: Self

## 2017-09-17 ENCOUNTER — Other Ambulatory Visit (HOSPITAL_COMMUNITY): Payer: Managed Care, Other (non HMO)

## 2017-09-23 ENCOUNTER — Other Ambulatory Visit (HOSPITAL_COMMUNITY): Payer: Self-pay | Admitting: *Deleted

## 2017-09-23 MED ORDER — CARVEDILOL 25 MG PO TABS: 25.0000 mg | ORAL_TABLET | Freq: Two times a day (BID) | ORAL | 3 refills | Status: DC

## 2017-09-24 ENCOUNTER — Ambulatory Visit (HOSPITAL_BASED_OUTPATIENT_CLINIC_OR_DEPARTMENT_OTHER): Payer: Managed Care, Other (non HMO)

## 2017-09-24 ENCOUNTER — Other Ambulatory Visit: Payer: Self-pay

## 2017-09-24 ENCOUNTER — Ambulatory Visit (HOSPITAL_COMMUNITY)
Admission: RE | Admit: 2017-09-24 | Discharge: 2017-09-24 | Disposition: A | Discharge status: Home / Self Care | Payer: Managed Care, Other (non HMO) | Source: Ambulatory Visit | Attending: Internal Medicine | Admitting: Internal Medicine

## 2017-09-24 DIAGNOSIS — I5022 Chronic systolic (congestive) heart failure: Secondary | ICD-10-CM | POA: Insufficient documentation

## 2017-09-24 DIAGNOSIS — I517 Cardiomegaly: Secondary | ICD-10-CM | POA: Diagnosis not present

## 2017-09-24 LAB — BASIC METABOLIC PANEL
Anion gap: 9 (ref 5–15)
BUN: 11 mg/dL (ref 6–20)
CO2: 27 mmol/L (ref 22–32)
Calcium: 9.2 mg/dL (ref 8.9–10.3)
Chloride: 101 mmol/L (ref 101–111)
Creatinine, Ser: 1.15 mg/dL — ABNORMAL HIGH (ref 0.44–1.00)
GFR calc Af Amer: >60 mL/min (ref >60)
GFR calc non Af Amer: 53 mL/min — ABNORMAL LOW (ref >60)
Glucose, Bld: 218 mg/dL — ABNORMAL HIGH (ref 65–99)
Potassium: 4.1 mmol/L (ref 3.5–5.1)
Sodium: 137 mmol/L (ref 135–145)

## 2017-09-28 ENCOUNTER — Other Ambulatory Visit (HOSPITAL_COMMUNITY): Payer: Self-pay | Admitting: Cardiology

## 2017-09-28 DIAGNOSIS — I5022 Chronic systolic (congestive) heart failure: Secondary | ICD-10-CM

## 2017-09-30 ENCOUNTER — Other Ambulatory Visit (HOSPITAL_COMMUNITY): Payer: Self-pay | Admitting: *Deleted

## 2017-09-30 DIAGNOSIS — I5022 Chronic systolic (congestive) heart failure: Secondary | ICD-10-CM

## 2017-09-30 MED ORDER — IVABRADINE HCL 5 MG PO TABS
5.0000 mg | ORAL_TABLET | Freq: Two times a day (BID) | ORAL | 11 refills | Status: DC
Start: 2017-09-30 — End: 2018-09-13

## 2017-11-09 ENCOUNTER — Other Ambulatory Visit (HOSPITAL_COMMUNITY): Payer: Self-pay | Admitting: Cardiology

## 2018-01-06 ENCOUNTER — Ambulatory Visit (HOSPITAL_COMMUNITY)
Admission: RE | Admit: 2018-01-06 | Discharge: 2018-01-06 | Disposition: A | Discharge status: Home / Self Care | Payer: Managed Care, Other (non HMO) | Source: Ambulatory Visit | Attending: Cardiology | Admitting: Cardiology

## 2018-01-06 ENCOUNTER — Encounter (HOSPITAL_COMMUNITY): Payer: Self-pay | Admitting: Cardiology

## 2018-01-06 VITALS — BP 104/54 | HR 57 | Wt 174.8 lb

## 2018-01-06 DIAGNOSIS — Z79899 Other long term (current) drug therapy: Secondary | ICD-10-CM | POA: Diagnosis not present

## 2018-01-06 DIAGNOSIS — Z79890 Hormone replacement therapy (postmenopausal): Secondary | ICD-10-CM | POA: Diagnosis not present

## 2018-01-06 DIAGNOSIS — I5022 Chronic systolic (congestive) heart failure: Secondary | ICD-10-CM | POA: Diagnosis not present

## 2018-01-06 DIAGNOSIS — E039 Hypothyroidism, unspecified: Secondary | ICD-10-CM | POA: Diagnosis not present

## 2018-01-06 DIAGNOSIS — Z7982 Long term (current) use of aspirin: Secondary | ICD-10-CM | POA: Insufficient documentation

## 2018-01-06 DIAGNOSIS — E119 Type 2 diabetes mellitus without complications: Secondary | ICD-10-CM | POA: Insufficient documentation

## 2018-01-06 DIAGNOSIS — E785 Hyperlipidemia, unspecified: Secondary | ICD-10-CM | POA: Diagnosis not present

## 2018-01-06 DIAGNOSIS — M797 Fibromyalgia: Secondary | ICD-10-CM | POA: Insufficient documentation

## 2018-01-06 DIAGNOSIS — Z7984 Long term (current) use of oral hypoglycemic drugs: Secondary | ICD-10-CM | POA: Diagnosis not present

## 2018-01-06 DIAGNOSIS — F329 Major depressive disorder, single episode, unspecified: Secondary | ICD-10-CM | POA: Diagnosis not present

## 2018-01-06 DIAGNOSIS — I110 Hypertensive heart disease with heart failure: Secondary | ICD-10-CM | POA: Insufficient documentation

## 2018-01-06 DIAGNOSIS — I428 Other cardiomyopathies: Secondary | ICD-10-CM | POA: Insufficient documentation

## 2018-01-06 LAB — BASIC METABOLIC PANEL
Anion gap: 9 (ref 5–15)
BUN: 24 mg/dL — ABNORMAL HIGH (ref 6–20)
CO2: 25 mmol/L (ref 22–32)
Calcium: 9.4 mg/dL (ref 8.9–10.3)
Chloride: 102 mmol/L (ref 101–111)
Creatinine, Ser: 1.34 mg/dL — ABNORMAL HIGH (ref 0.44–1.00)
GFR calc Af Amer: 51 mL/min — ABNORMAL LOW (ref >60)
GFR calc non Af Amer: 44 mL/min — ABNORMAL LOW (ref >60)
Glucose, Bld: 97 mg/dL (ref 65–99)
Potassium: 5.3 mmol/L — ABNORMAL HIGH (ref 3.5–5.1)
Sodium: 136 mmol/L (ref 135–145)

## 2018-01-06 MED ORDER — SACUBITRIL-VALSARTAN 49-51 MG PO TABS: 1.0000 | ORAL_TABLET | Freq: Two times a day (BID) | ORAL | 3 refills | Status: DC

## 2018-01-06 MED ORDER — FUROSEMIDE 20 MG PO TABS: 20.0000 mg | ORAL_TABLET | ORAL | 3 refills | Status: DC

## 2018-01-06 MED ORDER — SPIRONOLACTONE 25 MG PO TABS: 12.5000 mg | ORAL_TABLET | Freq: Every day | ORAL | 3 refills | Status: DC

## 2018-01-06 MED ORDER — SPIRONOLACTONE 25 MG PO TABS: 25.0000 mg | ORAL_TABLET | Freq: Every day | ORAL | 3 refills | Status: DC

## 2018-01-06 NOTE — Patient Instructions (Addendum)
Decrease Entresto 49/51 (1 tab), twice a day  Decrease Spironolactone 12.5 mg (1/2 tab) daily   Decrease Furosemide 20 mg (1 tab) every other day  Labs drawn today (if we do not call you, then your lab work was stable)   Your physician recommends that you return for lab work in: 10 days  Your physician recommends that you schedule a follow-up appointment in: 3 weeks Erika pharm D   Your physician recommends that you schedule a follow-up appointment in: 3 months with Dr. Shirlee Latch

## 2018-01-07 NOTE — Progress Notes (Signed)
Patient ID: Robin Owen, female   DOB: 28-Apr-1961, 57 y.o.   MRN: [id number] PCP: Dr Altheimer Cardiology: Dr. Shirlee Latch  HPI: Robin Owen is a 57 y.o. Caucasian female with past medical history of HTN, HLD, DMII, hypothyroidism and fibromyalgia (on disability).   Admitted 11/16 through 09/17/15 with increased dyspnea. CXR with pulmonary edema. This prompted and ECHO that showed reduced EF 20%. RHC/LHC showed normal coronaries and reduced cardiac index. Cardiac MRI showed some mid-wall late gadolinium enhancement in the septum.  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 She has been lisinopril for some time. Started on coreg and lasix.  Discharge weight was 163 pounds.   She developed AKI and hyperkalemia in the setting of high dose Ibuprofen.  I asked her to stop this and started her on tramadol as needed instead.    Echo in 11/18 showed increase in EF to 50-55%.      She returns for followup of CHF.  She has occasional lightheadedness with standing.  No falls or loss of consciousness.  No dyspnea except with heavy exertion.  No orthopnea/PND.  No chest pain.  No palpitations. She brings labs from Dr Altheimer's office from 2/19 that show hyperkalemia and low sodium.   Test/Procedures  09/15/2015 ECHO EF 20% MV mild regurgitation TV mild regurgitation  09/19/2015 CMRI 1. Severely dilated left ventricle with normal wall thickness and severe systolic dysfunction (LVEF 20-25%) with global hypokinesis and akinesis of the entire septal wall and diffuse mid wall late gadolinium enhancement. 2. Moderate RV dilatation with mild moderate systolic dysfunction. 3. Moderate left and right atrial dilatation. 4. Mild to moderate mitral and mild tricuspid regurgitation. 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  RHC/LHC 09/16/2015  RA 7 PCWP 29 CO 3.11 CI 1.7  Normal Cors  Echo (5/17) with EF 35-40%.   Echo (12/17) with EF 35-40%, mild LV  dilation.   CPX (12/17) with RER 1.13, peak VO2 20.8, VE/VCO2 slope 30.  Echo (11/18): EF 50-55%.   Labs 09/15/2015: BNP 2354 Labs 09/16/2015: TSH 1.99 Labs 09/17/2015: K 3.8 Creatinine 0.91 Labs 09/29/2015: K 4.5 Creatinine 1.09 Labs 10/05/2015: K 5.3 Creatinine 1.21, LDL 74, HCT 37.2 Labs 1/17: SPEP negative Labs 2/17: K 3.9, creatinine 1.34, digoxin 0.7, hemoglobin 12.5 Labs 3/17: K 3.9, creatinine 1.25, hemoglobin 10 Labs 4/17: K 4.2, creatinine 1.15 Labs 5/17: K 3.8, creatinine 1.11, HCT 31.7 Labs 6/17: K 5.1, creatinine 0.98, BNP 45 Labs 12/17: K 5, creatinine 1.17, BNP 35 Labs 3/18: K 3.9, creatinine 1.07 Labs 8/18: K 4.3, creatinine 0.94 Labs 2/19: K 5.7, creatinine 1.39, Na 125, hgb 12.6, LDL 102  ROS: All systems negative except as listed in HPI, PMH and Problem List.  SH:  Social History   Socioeconomic History  . Marital status: Married    Spouse name: Not on file  . Number of children: Not on file  . Years of education: Not on file  . Highest education level: Not on file  Social Needs  . Financial resource strain: Not on file  . Food insecurity - worry: Not on file  . Food insecurity - inability: Not on file  . Transportation needs - medical: Not on file  . Transportation needs - non-medical: Not on file  Occupational History  . Not on file  Tobacco Use  . Smoking status: Never Smoker  . Smokeless tobacco: Never Used  Substance and Sexual Activity  . Alcohol use: No  . Drug use: No  . Sexual activity: Yes  Birth control/protection: None  Other Topics Concern  . Not on file  Social History Narrative  . Not on file    FH:  Family History  Problem Relation Age of Onset  . Pulmonary fibrosis Mother   . Hypertension Mother   . Atrial fibrillation Mother   . Hypertension Father   . Diabetes Mellitus II Father   . Kidney disease Father   . Diabetes Mellitus II Brother   . Hypertension Brother   . Atrial fibrillation Maternal Grandmother   .  Hypertension Maternal Grandmother   . Stroke Maternal Grandmother     Past Medical History:  Diagnosis Date  . Anemia   . CHF (congestive heart failure) (HCC)   . Depression   . Diabetes mellitus without complication (HCC)   . Fibromyalgia   . Hyperlipidemia   . Hypertension   . Hypothyroidism     Current Outpatient Medications  Medication Sig Dispense Refill  . aspirin 81 MG tablet Take 81 mg by mouth daily.    Marland Kitchen atorvastatin (LIPITOR) 10 MG tablet Take 10 mg by mouth daily.    . carvedilol (COREG) 25 MG tablet Take 1 tablet (25 mg total) by mouth 2 (two) times daily with a meal. 180 tablet 3  . cyclobenzaprine (FLEXERIL) 10 MG tablet Take 10 mg by mouth 2 (two) times daily as needed for muscle spasms (pt takes qhs and PRN).     Marland Kitchen empagliflozin (JARDIANCE) 10 MG TABS tablet Take 10 mg by mouth daily.    Marland Kitchen FLUoxetine (PROZAC) 20 MG tablet Take 20 mg by mouth daily.    . furosemide (LASIX) 20 MG tablet Take 1 tablet (20 mg total) by mouth every other day. 15 tablet 3  . gabapentin (NEURONTIN) 600 MG tablet Take 300-600 mg by mouth 3 (three) times daily as needed (for fibromyalgia). Take 1/2 tablet (300mg ) by mouth in the morning, and afternonn, and 1 tablet (600mg ) at bedtime    . Iron-FA-B Cmp-C-Biot-Probiotic (FUSION PLUS PO) Take 1 capsule by mouth daily.    . ivabradine (CORLANOR) 5 MG TABS tablet Take 1 tablet (5 mg total) by mouth 2 (two) times daily with a meal. 60 tablet 11  . levothyroxine (SYNTHROID, LEVOTHROID) 88 MCG tablet Take 88 mcg by mouth daily before breakfast.    . Liraglutide (VICTOZA) 18 MG/3ML SOPN Inject 1.8 mg into the skin daily.    . metFORMIN (GLUCOPHAGE) 1000 MG tablet Take 1 tablet (1,000 mg total) by mouth 2 (two) times daily with a meal.    . Methylcellulose, Laxative, (MIRAFIBER) 500 MG TABS Take 500 mg by mouth daily.    . Multiple Vitamin (MULTIVITAMIN WITH MINERALS) TABS tablet Take 1 tablet by mouth daily.    Marland Kitchen NOVOTWIST 32G X 5 MM MISC Use as  directed with Victozia.  2  . ONETOUCH VERIO test strip Check blood glucose once daily  2  . spironolactone (ALDACTONE) 25 MG tablet Take 0.5 tablets (12.5 mg total) by mouth daily. 15 tablet 3  . traMADol (ULTRAM) 50 MG tablet Take 50 mg by mouth every 6 (six) hours as needed for moderate pain (pt takes qhs and PRN).     Marland Kitchen sacubitril-valsartan (ENTRESTO) 49-51 MG Take 1 tablet by mouth 2 (two) times daily. 60 tablet 3   No current facility-administered medications for this encounter.     Vitals:   01/06/18 1402  BP: (!) 104/54  Pulse: (!) 57  SpO2: 97%  Weight: 174 lb 12.8 oz (79.3 kg)  PHYSICAL EXAM: General: NAD Neck: No JVD, no thyromegaly or thyroid nodule.  Lungs: Clear to auscultation bilaterally with normal respiratory effort. CV: Nondisplaced PMI.  Heart regular S1/S2, no S3/S4, no murmur.  No peripheral edema.  No carotid bruit.  Normal pedal pulses.  Abdomen: Soft, nontender, no hepatosplenomegaly, no distention.  Skin: Intact without lesions or rashes.  Neurologic: Alert and oriented x 3.  Psych: Normal affect. Extremities: No clubbing or cyanosis.  HEENT: Normal.   ASSESSMENT & PLAN: 1. Chronic systolic CHF: 09/15/2015 ECHO EF 20%. Had cMRI EF 20-25%, RV mod dilated.  cMRI (11/16) showed an area of mid-wall septal late gadolinium enhancement. NICM perhaps related to HTN versus viral myocarditis. SPEP negative.  09/16/2015 RHC/LHC coronaries ok, low cardiac index 1.7.  Repeat echo in 5/17 and again in 12/17 showed some improvement, EF 35-40%.  CPX in 12/17 showed low normal peak VO2 with no clear cardiopulmonary limitation. Most recent echo in 11/18 showed EF up to 50-55%.   NYHA class II symptoms now.  She is not volume overloaded.  Last labs showed high K and low Na.  - Decrease Lasix to 20 mg every other day.     - Decrease spironolactone to 12.5 daily.  - Continue Coreg 25 mg bid.  - Decrease Entresto to 49/51 bid.   - Can continue ivabradine 5 mg bid.  - EF out  of range for ICD.   - She has been pushing po fluid intake. With low sodium, would cut back on po fluid intake.  - BMET today and again in 10 days. 2. Fibromyalgia: Disabled since 1996  3. Diabetes: Continue empagliflozin.   She is going to followup with our pharmacist in 3 wks to repeat labs and make any further medication adjustments that are needed.   Marca Ancona 01/07/2018

## 2018-01-08 ENCOUNTER — Telehealth (HOSPITAL_COMMUNITY): Payer: Self-pay | Admitting: Pharmacist

## 2018-01-08 NOTE — Telephone Encounter (Signed)
Entresto 49-51 mg BID PA approved by [employer] through 01/08/19.   [name]. Bonnye Fava, PharmD, BCPS, CPP Clinical Pharmacist Phone: [phone] 01/08/2018 9:08 AM

## 2018-01-16 ENCOUNTER — Ambulatory Visit (HOSPITAL_COMMUNITY)
Admission: RE | Admit: 2018-01-16 | Discharge: 2018-01-16 | Disposition: A | Discharge status: Home / Self Care | Payer: Managed Care, Other (non HMO) | Source: Ambulatory Visit | Attending: Cardiology | Admitting: Cardiology

## 2018-01-16 DIAGNOSIS — I5022 Chronic systolic (congestive) heart failure: Secondary | ICD-10-CM | POA: Diagnosis present

## 2018-01-16 LAB — BASIC METABOLIC PANEL
Anion gap: 10 (ref 5–15)
BUN: 16 mg/dL (ref 6–20)
CO2: 26 mmol/L (ref 22–32)
Calcium: 9.4 mg/dL (ref 8.9–10.3)
Chloride: 101 mmol/L (ref 101–111)
Creatinine, Ser: 1.24 mg/dL — ABNORMAL HIGH (ref 0.44–1.00)
GFR calc Af Amer: 56 mL/min — ABNORMAL LOW (ref >60)
GFR calc non Af Amer: 48 mL/min — ABNORMAL LOW (ref >60)
Glucose, Bld: 172 mg/dL — ABNORMAL HIGH (ref 65–99)
Potassium: 5.3 mmol/L — ABNORMAL HIGH (ref 3.5–5.1)
Sodium: 137 mmol/L (ref 135–145)

## 2018-01-29 ENCOUNTER — Ambulatory Visit (HOSPITAL_COMMUNITY)
Admission: RE | Admit: 2018-01-29 | Discharge: 2018-01-29 | Disposition: A | Discharge status: Home / Self Care | Payer: Managed Care, Other (non HMO) | Source: Ambulatory Visit | Attending: Internal Medicine | Admitting: Internal Medicine

## 2018-01-29 VITALS — BP 114/80 | HR 75 | Wt 175.4 lb

## 2018-01-29 DIAGNOSIS — E875 Hyperkalemia: Secondary | ICD-10-CM | POA: Insufficient documentation

## 2018-01-29 DIAGNOSIS — E785 Hyperlipidemia, unspecified: Secondary | ICD-10-CM | POA: Insufficient documentation

## 2018-01-29 DIAGNOSIS — I5022 Chronic systolic (congestive) heart failure: Secondary | ICD-10-CM | POA: Diagnosis present

## 2018-01-29 DIAGNOSIS — E039 Hypothyroidism, unspecified: Secondary | ICD-10-CM | POA: Diagnosis not present

## 2018-01-29 DIAGNOSIS — I110 Hypertensive heart disease with heart failure: Secondary | ICD-10-CM | POA: Insufficient documentation

## 2018-01-29 DIAGNOSIS — M797 Fibromyalgia: Secondary | ICD-10-CM | POA: Insufficient documentation

## 2018-01-29 DIAGNOSIS — E119 Type 2 diabetes mellitus without complications: Secondary | ICD-10-CM | POA: Insufficient documentation

## 2018-01-29 LAB — BASIC METABOLIC PANEL
Anion gap: 12 (ref 5–15)
BUN: 18 mg/dL (ref 6–20)
CO2: 26 mmol/L (ref 22–32)
Calcium: 9.7 mg/dL (ref 8.9–10.3)
Chloride: 100 mmol/L — ABNORMAL LOW (ref 101–111)
Creatinine, Ser: 1.23 mg/dL — ABNORMAL HIGH (ref 0.44–1.00)
GFR calc Af Amer: 57 mL/min — ABNORMAL LOW (ref >60)
GFR calc non Af Amer: 49 mL/min — ABNORMAL LOW (ref >60)
Glucose, Bld: 168 mg/dL — ABNORMAL HIGH (ref 65–99)
Potassium: 4.9 mmol/L (ref 3.5–5.1)
Sodium: 138 mmol/L (ref 135–145)

## 2018-01-29 NOTE — Progress Notes (Signed)
HF MD: [facility]  HPI:  Robin Owen is a 57 y.o. Caucasian female with past medical history of HTN, HLD, DMII, hypothyroidism and fibromyalgia (on disability).   Admitted 11/16 through 09/17/15 with increased dyspnea. CXR with pulmonary edema. This prompted and ECHO that showed reduced EF 20%. RHC/LHC showed normal coronaries and reduced cardiac index. Cardiac MRI showed some mid-wall late gadolinium enhancement in the septum.  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 She has been lisinopril for some time. Started on coreg and lasix.  Discharge weight was 163 pounds.   She developed AKI and hyperkalemia in the setting of high dose Ibuprofen.  I asked her to stop this and started her on tramadol as needed instead.    Echo in 11/18 showed increase in EF to 50-55%.      She returns today for pharmacist-led HF medication titration. At last HF clincic visit on 01/06/18, her furosemide was reduced to 20 mg QOD, Entresto was reduced to 49/51 mg BID and her spironolactone was reduced to 12.5 mg D 2/2 hyperkalemia and hyponatremia. F/u BMET on 01/16/18 still showed hyperkalemia at 5.3, SCr somewhat improved. She was supposed to start on a low K diet but we were unable to get in contact with her. She is no longer experiencing dizziness. She reports great compliance with her medication regimen and has not had any major changes in diet. She stays away from most high K containing foods.      . Shortness of breath/dyspnea on exertion? no  . Orthopnea/PND? no . Edema? no . Lightheadedness/dizziness? no . Daily weights at home? Yes - stable ~172-173 lb . Blood pressure/heart rate monitoring at home? no . Following low-sodium/fluid-restricted diet? yes  HF Medications: Carvedilol 25 mg PO BID Furosemide 10 mg PO daily Corlanor 5 mg PO BID Entresto 49-51 mg PO BID Spironolactone 12.5 mg PO daily   Has the patient been experiencing any side effects to the medications prescribed?  no  Does the patient have any problems  obtaining medications due to transportation or finances?   No - [employer] commercial   Understanding of regimen: good Understanding of indications: good Potential of compliance: good Patient understands to avoid NSAIDs. Patient understands to avoid decongestants.    Pertinent Lab Values: . 01/29/18: Serum creatinine 1.23 (BL 1.1-1.2), BUN 18, Potassium 4.9 Sodium 138  Vital Signs: . Weight: 175.4 lb (dry weight: 174 lb) . Blood pressure: 114/80 mmHg  . Heart rate: 75 bpm    Assessment: 1. Chronicsystolic CHF (EF 63>>78-58%>>85-02%), due to NICM. NYHA class IIsymptoms.  - Volume status stable  - Continue current regimen of furosemide 10 mg daily, carvedilol 25 mg BID, Corlanor 5 mg BID, Entresto 49-51 mg BID and spironolactone 12.5 mg daily   - BMET today with improved K level although still at higher end of normal - Basic disease state pathophysiology, medication indication, mechanism and side effects reviewed at length with patient and he verbalized understanding 2. Hyperkalemia   - Serum K level  Improved from 5.3>>4.9 but still on high end of normal   - Discussed maintaining a low K diet and was given a handout on K containing foods  - Signed Veltassa form in the event that she becomes hyperkalemic again   3. Fibromyalgia: Disabled since 1996  4. Diabetes: Continue empagliflozin   Plan: 1) Medication changes: Based on clinical presentation, vital signs and recent labs will continue current regimen. May consider addition of Veltassa if hyperkalemic again. 2) Labs: BMET today  3) Follow-up: Pharmacy visit on  02/26/18 and Dr. Shirlee Latch on 04/08/18   [name]. Bonnye Fava, PharmD, BCPS, CPP Clinical Pharmacist Pager: [phone] Phone: [phone] 01/29/2018 11:46 AM

## 2018-01-29 NOTE — Patient Instructions (Signed)
It was great to see you today!  Please continue your current medications.   Blood work today. We will call you with any changes.   You are scheduled to see the pharmacist, Cicero Duck, again on 02/26/2018 at 1:15 PM.

## 2018-02-26 ENCOUNTER — Ambulatory Visit (HOSPITAL_COMMUNITY)
Admission: RE | Admit: 2018-02-26 | Discharge: 2018-02-26 | Disposition: A | Discharge status: Home / Self Care | Payer: Managed Care, Other (non HMO) | Source: Ambulatory Visit | Attending: Internal Medicine | Admitting: Internal Medicine

## 2018-02-26 VITALS — BP 100/68 | HR 67 | Wt 174.2 lb

## 2018-02-26 DIAGNOSIS — E875 Hyperkalemia: Secondary | ICD-10-CM | POA: Diagnosis not present

## 2018-02-26 DIAGNOSIS — M797 Fibromyalgia: Secondary | ICD-10-CM | POA: Diagnosis not present

## 2018-02-26 DIAGNOSIS — Z79899 Other long term (current) drug therapy: Secondary | ICD-10-CM | POA: Insufficient documentation

## 2018-02-26 DIAGNOSIS — E119 Type 2 diabetes mellitus without complications: Secondary | ICD-10-CM | POA: Diagnosis not present

## 2018-02-26 DIAGNOSIS — I5022 Chronic systolic (congestive) heart failure: Secondary | ICD-10-CM | POA: Diagnosis not present

## 2018-02-26 DIAGNOSIS — I110 Hypertensive heart disease with heart failure: Secondary | ICD-10-CM | POA: Diagnosis present

## 2018-02-26 DIAGNOSIS — Z7984 Long term (current) use of oral hypoglycemic drugs: Secondary | ICD-10-CM | POA: Insufficient documentation

## 2018-02-26 LAB — BASIC METABOLIC PANEL
Anion gap: 9 (ref 5–15)
BUN: 17 mg/dL (ref 6–20)
CO2: 29 mmol/L (ref 22–32)
Calcium: 9.5 mg/dL (ref 8.9–10.3)
Chloride: 99 mmol/L — ABNORMAL LOW (ref 101–111)
Creatinine, Ser: 1.12 mg/dL — ABNORMAL HIGH (ref 0.44–1.00)
GFR calc Af Amer: >60 mL/min (ref >60)
GFR calc non Af Amer: 55 mL/min — ABNORMAL LOW (ref >60)
Glucose, Bld: 185 mg/dL — ABNORMAL HIGH (ref 65–99)
Potassium: 4.7 mmol/L (ref 3.5–5.1)
Sodium: 137 mmol/L (ref 135–145)

## 2018-02-26 MED ORDER — FUROSEMIDE 20 MG PO TABS: 20.0000 mg | ORAL_TABLET | Freq: Every day | ORAL | 5 refills | Status: DC

## 2018-02-26 NOTE — Patient Instructions (Signed)
It was great to see you today!  Please continue your current medications.   You can try Miralax daily to help with your constipation.   Blood work today. We will call you with any changes.   Please keep your appointment with Dr. Shirlee Latch on 04/08/18.

## 2018-02-26 NOTE — Progress Notes (Signed)
HF MD: [facility]  HPI:  Robin Owen is a66 y.o.Caucasian female with past medical history of HTN, HLD, DMII, hypothyroidism and fibromyalgia (on disability).   Admitted 11/16 through 09/17/15 with increased dyspnea. CXR with pulmonary edema. This prompted and ECHO that showed reduced EF 20%. RHC/LHC showed normal coronaries and reduced cardiac index. Cardiac MRI showed some mid-wall late gadolinium enhancement in the septum. 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 She has been lisinopril for some time. Started on coreg and lasix. Discharge weight was 163 pounds.   She developed AKI and hyperkalemia in the setting of high dose Ibuprofen. I asked her to stop this and started her on tramadol as needed instead.   Echo in 11/18 showed increase in EF to 50-55%.  She returns today for pharmacist-led HF medication titration.At last HF pharmacy visit on 01/29/18, she was continued on her current regimen. Serum K+ level was better on last BMET but still on high end of goal at 4.9. She is no longer experiencing any dizziness but is now complaining of constipation which has occurred off and on for the last month or so. She has not had any changes in her medication regimen in at least 2 months. She reports great compliance with her medication regimen and has not had any major changes in diet. She stays away from most high K containing foods.    Shortness of breath/dyspnea on exertion? no   Orthopnea/PND? no  Edema? no  Lightheadedness/dizziness? no  Daily weights at home? Yes - stable ~172-173 lb  Blood pressure/heart rate monitoring at home? no  Following low-sodium/fluid-restricted diet? yes  HF Medications: Carvedilol 25 mg PO BID Furosemide 20 mg PO daily - was not able to tolerate 20 mg QOD with increase LE edema Corlanor 5 mg PO BID Entresto 49-51 mg PO BID Spironolactone 12.5 mg PO daily   Has the patient been experiencing any side effects to the medications prescribed?  no  Does the  patient have any problems obtaining medications due to transportation or finances?   No - [employer] commercial   Understanding of regimen: good Understanding of indications: good Potential of compliance: good Patient understands to avoid NSAIDs. Patient understands to avoid decongestants.   Pertinent Lab Values:  02/26/18: Serum creatinine 1.12 (BL 1.1-1.2), BUN 17, Potassium 4.7, Sodium 137  01/29/18: Serum creatinine 1.23 (BL 1.1-1.2), BUN 18, Potassium 4.9 Sodium 138    Vital Signs:  Weight: 174 lb (dry weight: 174 lb)  Blood pressure: 100/68 mmHg   Heart rate: 67 bpm    Assessment: 1. Chronicsystolic CHF (EF 01>>09-32%>>35-57%), due to NICM. NYHA class IIsymptoms.  - Volume status stable  - With soft BP, will current regimen of furosemide 20 mg daily, carvedilol 25 mg BID, Corlanor 5 mg BID, Entresto 49-51 mg BID and spironolactone 12.5 mg daily   - BMET today with improved K level  - Basic disease state pathophysiology, medication indication, mechanism and side effects reviewed at length with patient and he verbalized understanding 2. Hyperkalemia   - Serum K level  Improved from 5.3>>4.9>>4.7   - Signed Veltassa form in the event that she becomes hyperkalemic again   3. Fibromyalgia: Disabled since 1996  4. Diabetes: Continue empagliflozin   Plan: 1) Medication changes: Based on clinical presentation, vital signs and recent labs will continue current regimen.  2) Labs: BMET today  3) Follow-up: Dr. Shirlee Latch on 04/08/18     [name]. Bonnye Fava, PharmD, BCPS, CPP Clinical Pharmacist Pager: [phone] Phone: [phone] 02/26/2018 12:58 PM

## 2018-03-30 ENCOUNTER — Other Ambulatory Visit (HOSPITAL_COMMUNITY): Payer: Self-pay | Admitting: Cardiology

## 2018-04-08 ENCOUNTER — Ambulatory Visit (HOSPITAL_COMMUNITY)
Admission: RE | Admit: 2018-04-08 | Discharge: 2018-04-08 | Disposition: A | Discharge status: Home / Self Care | Payer: Managed Care, Other (non HMO) | Source: Ambulatory Visit | Attending: Cardiology | Admitting: Cardiology

## 2018-04-08 ENCOUNTER — Other Ambulatory Visit: Payer: Self-pay

## 2018-04-08 ENCOUNTER — Encounter (HOSPITAL_COMMUNITY): Payer: Self-pay | Admitting: Cardiology

## 2018-04-08 VITALS — BP 113/76 | HR 75 | Wt 173.5 lb

## 2018-04-08 DIAGNOSIS — E785 Hyperlipidemia, unspecified: Secondary | ICD-10-CM | POA: Diagnosis not present

## 2018-04-08 DIAGNOSIS — Z79899 Other long term (current) drug therapy: Secondary | ICD-10-CM | POA: Insufficient documentation

## 2018-04-08 DIAGNOSIS — I5022 Chronic systolic (congestive) heart failure: Secondary | ICD-10-CM

## 2018-04-08 DIAGNOSIS — Z833 Family history of diabetes mellitus: Secondary | ICD-10-CM | POA: Diagnosis not present

## 2018-04-08 DIAGNOSIS — E039 Hypothyroidism, unspecified: Secondary | ICD-10-CM | POA: Insufficient documentation

## 2018-04-08 DIAGNOSIS — M797 Fibromyalgia: Secondary | ICD-10-CM | POA: Insufficient documentation

## 2018-04-08 DIAGNOSIS — I110 Hypertensive heart disease with heart failure: Secondary | ICD-10-CM | POA: Insufficient documentation

## 2018-04-08 DIAGNOSIS — E119 Type 2 diabetes mellitus without complications: Secondary | ICD-10-CM | POA: Insufficient documentation

## 2018-04-08 DIAGNOSIS — Z8249 Family history of ischemic heart disease and other diseases of the circulatory system: Secondary | ICD-10-CM | POA: Insufficient documentation

## 2018-04-08 DIAGNOSIS — F329 Major depressive disorder, single episode, unspecified: Secondary | ICD-10-CM | POA: Insufficient documentation

## 2018-04-08 DIAGNOSIS — Z823 Family history of stroke: Secondary | ICD-10-CM | POA: Insufficient documentation

## 2018-04-08 LAB — BASIC METABOLIC PANEL
Anion gap: 13 (ref 5–15)
BUN: 18 mg/dL (ref 6–20)
CO2: 27 mmol/L (ref 22–32)
Calcium: 9.6 mg/dL (ref 8.9–10.3)
Chloride: 101 mmol/L (ref 101–111)
Creatinine, Ser: 1.15 mg/dL — ABNORMAL HIGH (ref 0.44–1.00)
GFR calc Af Amer: >60 mL/min (ref >60)
GFR calc non Af Amer: 53 mL/min — ABNORMAL LOW (ref >60)
Glucose, Bld: 161 mg/dL — ABNORMAL HIGH (ref 65–99)
Potassium: 4.1 mmol/L (ref 3.5–5.1)
Sodium: 141 mmol/L (ref 135–145)

## 2018-04-08 NOTE — Patient Instructions (Signed)
Labs drawn today (if we do not call you, then your lab work was stable)   Your physician recommends that you return for lab work in: 3 months  Your physician recommends that you schedule a follow-up appointment in: 6 months with Dr. Shirlee Latch  Please Call an Schedule Appointment (call in August)

## 2018-04-09 NOTE — Progress Notes (Signed)
Patient ID: Robin Owen, female   DOB: 29-Mar-1961, 57 y.o.   MRN: [id number] PCP: Dr Altheimer Cardiology: Dr. Shirlee Latch  HPI: Robin Owen is a 57 y.o. Caucasian female with past medical history of HTN, HLD, DMII, hypothyroidism and fibromyalgia (on disability).   Admitted 11/16 through 09/17/15 with increased dyspnea. CXR with pulmonary edema. This prompted and ECHO that showed reduced EF 20%. RHC/LHC showed normal coronaries and reduced cardiac index. Cardiac MRI showed some mid-wall late gadolinium enhancement in the septum.  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 She has been lisinopril for some time. Started on coreg and lasix.  Discharge weight was 163 pounds.   She developed AKI and hyperkalemia in the setting of high dose Ibuprofen.  I asked her to stop this and started her on tramadol as needed instead.    Echo in 11/18 showed increase in EF to 50-55%.      She returns for followup of CHF.  She is doing well.  Weight down 1 lb.  No exertional dyspnea or chest pain.  No orthopnea/PND.    Test/Procedures  09/15/2015 ECHO EF 20% MV mild regurgitation TV mild regurgitation  09/19/2015 CMRI 1. Severely dilated left ventricle with normal wall thickness and severe systolic dysfunction (LVEF 20-25%) with global hypokinesis and akinesis of the entire septal wall and diffuse mid wall late gadolinium enhancement. 2. Moderate RV dilatation with mild moderate systolic dysfunction. 3. Moderate left and right atrial dilatation. 4. Mild to moderate mitral and mild tricuspid regurgitation. 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  RHC/LHC 09/16/2015  RA 7 PCWP 29 CO 3.11 CI 1.7  Normal Cors  Echo (5/17) with EF 35-40%.   Echo (12/17) with EF 35-40%, mild LV dilation.   CPX (12/17) with RER 1.13, peak VO2 20.8, VE/VCO2 slope 30.  Echo (11/18): EF 50-55%.   Labs 09/15/2015: BNP 2354 Labs 09/16/2015: TSH 1.99 Labs 09/17/2015:  K 3.8 Creatinine 0.91 Labs 09/29/2015: K 4.5 Creatinine 1.09 Labs 10/05/2015: K 5.3 Creatinine 1.21, LDL 74, HCT 37.2 Labs 1/17: SPEP negative Labs 2/17: K 3.9, creatinine 1.34, digoxin 0.7, hemoglobin 12.5 Labs 3/17: K 3.9, creatinine 1.25, hemoglobin 10 Labs 4/17: K 4.2, creatinine 1.15 Labs 5/17: K 3.8, creatinine 1.11, HCT 31.7 Labs 6/17: K 5.1, creatinine 0.98, BNP 45 Labs 12/17: K 5, creatinine 1.17, BNP 35 Labs 3/18: K 3.9, creatinine 1.07 Labs 8/18: K 4.3, creatinine 0.94 Labs 2/19: K 5.7, creatinine 1.39, Na 125, hgb 12.6, LDL 102 Labs 5/19: K 4.7, creatinine 1.12  ROS: All systems negative except as listed in HPI, PMH and Problem List.  SH:  Social History   Socioeconomic History  . Marital status: Married    Spouse name: Not on file  . Number of children: Not on file  . Years of education: Not on file  . Highest education level: Not on file  Occupational History  . Not on file  Social Needs  . Financial resource strain: Not on file  . Food insecurity:    Worry: Not on file    Inability: Not on file  . Transportation needs:    Medical: Not on file    Non-medical: Not on file  Tobacco Use  . Smoking status: Never Smoker  . Smokeless tobacco: Never Used  Substance and Sexual Activity  . Alcohol use: No  . Drug use: No  . Sexual activity: Yes    Birth control/protection: None  Lifestyle  . Physical activity:    Days per week: Not  on file    Minutes per session: Not on file  . Stress: Not on file  Relationships  . Social connections:    Talks on phone: Not on file    Gets together: Not on file    Attends religious service: Not on file    Active member of club or organization: Not on file    Attends meetings of clubs or organizations: Not on file    Relationship status: Not on file  . Intimate partner violence:    Fear of current or ex partner: Not on file    Emotionally abused: Not on file    Physically abused: Not on file    Forced sexual activity:  Not on file  Other Topics Concern  . Not on file  Social History Narrative  . Not on file    FH:  Family History  Problem Relation Age of Onset  . Pulmonary fibrosis Mother   . Hypertension Mother   . Atrial fibrillation Mother   . Hypertension Father   . Diabetes Mellitus II Father   . Kidney disease Father   . Diabetes Mellitus II Brother   . Hypertension Brother   . Atrial fibrillation Maternal Grandmother   . Hypertension Maternal Grandmother   . Stroke Maternal Grandmother     Past Medical History:  Diagnosis Date  . Anemia   . CHF (congestive heart failure) (HCC)   . Depression   . Diabetes mellitus without complication (HCC)   . Fibromyalgia   . Hyperlipidemia   . Hypertension   . Hypothyroidism     Current Outpatient Medications  Medication Sig Dispense Refill  . acetaminophen (TYLENOL) 325 MG tablet Take 650 mg by mouth every 6 (six) hours as needed for mild pain.    Marland Kitchen aspirin 81 MG tablet Take 81 mg by mouth daily.    Marland Kitchen atorvastatin (LIPITOR) 10 MG tablet Take 10 mg by mouth daily.    . carvedilol (COREG) 25 MG tablet Take 1 tablet (25 mg total) by mouth 2 (two) times daily with a meal. 180 tablet 3  . Cholecalciferol (VITAMIN D-1000 MAX ST) 1000 units tablet Take 1,000 Units by mouth daily.    . cyclobenzaprine (FLEXERIL) 10 MG tablet Take 10 mg by mouth 2 (two) times daily as needed for muscle spasms (pt takes qhs and PRN).     Marland Kitchen empagliflozin (JARDIANCE) 10 MG TABS tablet Take 10 mg by mouth daily.    Marland Kitchen FLUoxetine (PROZAC) 20 MG tablet Take 20 mg by mouth daily.    . furosemide (LASIX) 20 MG tablet Take 1 tablet (20 mg total) by mouth daily. 30 tablet 5  . gabapentin (NEURONTIN) 600 MG tablet Take 300-600 mg by mouth 3 (three) times daily as needed (for fibromyalgia). Take 1/2 tablet (300mg ) by mouth in the morning, and afternonn, and 1 tablet (600mg ) at bedtime    . Iron-FA-B Cmp-C-Biot-Probiotic (FUSION PLUS PO) Take 1 capsule by mouth daily.    .  ivabradine (CORLANOR) 5 MG TABS tablet Take 1 tablet (5 mg total) by mouth 2 (two) times daily with a meal. 60 tablet 11  . levothyroxine (SYNTHROID, LEVOTHROID) 88 MCG tablet Take 88 mcg by mouth daily before breakfast.    . Liraglutide (VICTOZA) 18 MG/3ML SOPN Inject 1.8 mg into the skin daily.    . metFORMIN (GLUCOPHAGE) 1000 MG tablet Take 1 tablet (1,000 mg total) by mouth 2 (two) times daily with a meal.    . Methylcellulose, Laxative, (MIRAFIBER) 500  MG TABS Take 500 mg by mouth daily.    . Multiple Vitamin (MULTIVITAMIN WITH MINERALS) TABS tablet Take 1 tablet by mouth daily.    Marland Kitchen NOVOTWIST 32G X 5 MM MISC Use as directed with Victozia.  2  . ONETOUCH VERIO test strip Check blood glucose once daily  2  . sacubitril-valsartan (ENTRESTO) 49-51 MG Take 1 tablet by mouth 2 (two) times daily. 60 tablet 3  . spironolactone (ALDACTONE) 25 MG tablet Take 0.5 tablets (12.5 mg total) by mouth daily. 15 tablet 3  . traMADol (ULTRAM) 50 MG tablet Take 50 mg by mouth every 6 (six) hours as needed for moderate pain (pt takes qhs and PRN).      No current facility-administered medications for this encounter.     Vitals:   04/08/18 1456  BP: 113/76  Pulse: 75  SpO2: 99%  Weight: 173 lb 8 oz (78.7 kg)    PHYSICAL EXAM: General: NAD Neck: No JVD, no thyromegaly or thyroid nodule.  Lungs: Clear to auscultation bilaterally with normal respiratory effort. CV: Nondisplaced PMI.  Heart regular S1/S2, no S3/S4, no murmur.  No peripheral edema.  No carotid bruit.  Normal pedal pulses.  Abdomen: Soft, nontender, no hepatosplenomegaly, no distention.  Skin: Intact without lesions or rashes.  Neurologic: Alert and oriented x 3.  Psych: Normal affect. Extremities: No clubbing or cyanosis.  HEENT: Normal.   ASSESSMENT & PLAN: 1. Chronic systolic CHF: 09/15/2015 ECHO EF 20%. Had cMRI EF 20-25%, RV mod dilated.  cMRI (11/16) showed an area of mid-wall septal late gadolinium enhancement. NICM perhaps  related to HTN versus viral myocarditis. SPEP negative.  09/16/2015 RHC/LHC coronaries ok, low cardiac index 1.7.  Repeat echo in 5/17 and again in 12/17 showed some improvement, EF 35-40%.  CPX in 12/17 showed low normal peak VO2 with no clear cardiopulmonary limitation. Most recent echo in 11/18 showed EF up to 50-55%.   NYHA class II symptoms now.  She is not volume overloaded.   - Continue Lasix 20 mg every other day, check BMET today.      - Continue spironolactone 12.5 daily.  - Continue Coreg 25 mg bid.  - Continue Entresto to 49/51 bid.   - Can continue ivabradine 5 mg bid.  - EF out of range for ICD.   2. Fibromyalgia: Disabled since 1996  3. Diabetes: Continue empagliflozin.   Followup in 6 months but will repeat BMET in 3 months.   Marca Ancona 04/09/2018

## 2018-05-05 ENCOUNTER — Other Ambulatory Visit (HOSPITAL_COMMUNITY): Payer: Self-pay | Admitting: Cardiology

## 2018-06-22 ENCOUNTER — Other Ambulatory Visit (HOSPITAL_COMMUNITY): Payer: Self-pay | Admitting: Cardiology

## 2018-07-09 ENCOUNTER — Ambulatory Visit (HOSPITAL_COMMUNITY)
Admission: RE | Admit: 2018-07-09 | Discharge: 2018-07-09 | Disposition: A | Discharge status: Home / Self Care | Payer: Managed Care, Other (non HMO) | Source: Ambulatory Visit | Attending: Cardiology | Admitting: Cardiology

## 2018-07-09 DIAGNOSIS — I5022 Chronic systolic (congestive) heart failure: Secondary | ICD-10-CM | POA: Diagnosis present

## 2018-07-09 LAB — BASIC METABOLIC PANEL
Anion gap: 10 (ref 5–15)
BUN: 12 mg/dL (ref 6–20)
CO2: 30 mmol/L (ref 22–32)
Calcium: 9 mg/dL (ref 8.9–10.3)
Chloride: 98 mmol/L (ref 98–111)
Creatinine, Ser: 1.18 mg/dL — ABNORMAL HIGH (ref 0.44–1.00)
GFR calc Af Amer: 59 mL/min — ABNORMAL LOW (ref >60)
GFR calc non Af Amer: 51 mL/min — ABNORMAL LOW (ref >60)
Glucose, Bld: 188 mg/dL — ABNORMAL HIGH (ref 70–99)
Potassium: 4.3 mmol/L (ref 3.5–5.1)
Sodium: 138 mmol/L (ref 135–145)

## 2018-07-30 ENCOUNTER — Other Ambulatory Visit (HOSPITAL_COMMUNITY): Payer: Self-pay

## 2018-07-30 ENCOUNTER — Telehealth (HOSPITAL_COMMUNITY): Payer: Self-pay

## 2018-07-30 MED ORDER — FUROSEMIDE 20 MG PO TABS: 20.0000 mg | ORAL_TABLET | Freq: Every day | ORAL | 5 refills | Status: DC

## 2018-07-30 NOTE — Telephone Encounter (Signed)
She should continue on Lasix 20 mg daily.

## 2018-07-30 NOTE — Telephone Encounter (Signed)
Left VM for pt 

## 2018-07-30 NOTE — Telephone Encounter (Signed)
Pt called stating that she needs a refill on lasix 20 mg. Pt reports taking 40 mg in am and 40 mg in pm. I do not see documentation of the dose change from 1 (20 mg) daily. Please advise.

## 2018-08-07 ENCOUNTER — Telehealth (HOSPITAL_COMMUNITY): Payer: Self-pay

## 2018-08-07 ENCOUNTER — Other Ambulatory Visit (HOSPITAL_COMMUNITY): Payer: Self-pay

## 2018-08-07 MED ORDER — FUROSEMIDE 20 MG PO TABS: 40.0000 mg | ORAL_TABLET | Freq: Every day | ORAL | 1 refills | Status: DC

## 2018-08-07 NOTE — Telephone Encounter (Signed)
Pt states that she was taken off of Jardiance due to having yeast infections every 2 weeks.

## 2018-08-07 NOTE — Telephone Encounter (Signed)
Pt called and stated that is retaining fluid in her hands and feet. Pt has gained 5-7 lbs since she was taken off of Jardiance. Pt is currently on furosemide 20 mg once daily. Pt also states that she took an extra 20 after dinner on 10/09 and it helped but she can still tell she has fluid built up. Pt is not taking K. Please advise.

## 2018-08-07 NOTE — Telephone Encounter (Signed)
Thank you!  Ok to increase lasix to 40 mg daily as discussed.    Casimiro Needle [address] [place], PA-C 08/07/2018 10:50 AM

## 2018-08-18 ENCOUNTER — Other Ambulatory Visit (HOSPITAL_COMMUNITY): Payer: Self-pay | Admitting: Cardiology

## 2018-08-22 ENCOUNTER — Encounter (HOSPITAL_COMMUNITY): Payer: Managed Care, Other (non HMO)

## 2018-08-28 ENCOUNTER — Other Ambulatory Visit (HOSPITAL_COMMUNITY): Payer: Self-pay | Admitting: Cardiology

## 2018-09-12 ENCOUNTER — Other Ambulatory Visit (HOSPITAL_COMMUNITY): Payer: Self-pay | Admitting: Cardiology

## 2018-09-13 ENCOUNTER — Other Ambulatory Visit (HOSPITAL_COMMUNITY): Payer: Self-pay | Admitting: Cardiology

## 2018-09-13 DIAGNOSIS — I5022 Chronic systolic (congestive) heart failure: Secondary | ICD-10-CM

## 2018-10-09 ENCOUNTER — Encounter (HOSPITAL_COMMUNITY): Payer: Self-pay | Admitting: Cardiology

## 2018-10-09 ENCOUNTER — Ambulatory Visit (HOSPITAL_COMMUNITY)
Admission: RE | Admit: 2018-10-09 | Discharge: 2018-10-09 | Disposition: A | Discharge status: Home / Self Care | Payer: Managed Care, Other (non HMO) | Source: Ambulatory Visit | Attending: Cardiology | Admitting: Cardiology

## 2018-10-09 VITALS — BP 138/82 | HR 68 | Wt 184.4 lb

## 2018-10-09 DIAGNOSIS — I428 Other cardiomyopathies: Secondary | ICD-10-CM | POA: Diagnosis not present

## 2018-10-09 DIAGNOSIS — E119 Type 2 diabetes mellitus without complications: Secondary | ICD-10-CM | POA: Insufficient documentation

## 2018-10-09 DIAGNOSIS — E785 Hyperlipidemia, unspecified: Secondary | ICD-10-CM | POA: Insufficient documentation

## 2018-10-09 DIAGNOSIS — M797 Fibromyalgia: Secondary | ICD-10-CM | POA: Insufficient documentation

## 2018-10-09 DIAGNOSIS — E039 Hypothyroidism, unspecified: Secondary | ICD-10-CM | POA: Insufficient documentation

## 2018-10-09 DIAGNOSIS — B379 Candidiasis, unspecified: Secondary | ICD-10-CM | POA: Insufficient documentation

## 2018-10-09 DIAGNOSIS — Z79899 Other long term (current) drug therapy: Secondary | ICD-10-CM | POA: Diagnosis not present

## 2018-10-09 DIAGNOSIS — E875 Hyperkalemia: Secondary | ICD-10-CM | POA: Insufficient documentation

## 2018-10-09 DIAGNOSIS — I5022 Chronic systolic (congestive) heart failure: Secondary | ICD-10-CM | POA: Diagnosis present

## 2018-10-09 DIAGNOSIS — F329 Major depressive disorder, single episode, unspecified: Secondary | ICD-10-CM | POA: Diagnosis not present

## 2018-10-09 DIAGNOSIS — Z794 Long term (current) use of insulin: Secondary | ICD-10-CM | POA: Diagnosis not present

## 2018-10-09 DIAGNOSIS — Z79890 Hormone replacement therapy (postmenopausal): Secondary | ICD-10-CM | POA: Diagnosis not present

## 2018-10-09 DIAGNOSIS — Z8249 Family history of ischemic heart disease and other diseases of the circulatory system: Secondary | ICD-10-CM | POA: Diagnosis not present

## 2018-10-09 DIAGNOSIS — I110 Hypertensive heart disease with heart failure: Secondary | ICD-10-CM | POA: Diagnosis not present

## 2018-10-09 DIAGNOSIS — Z833 Family history of diabetes mellitus: Secondary | ICD-10-CM | POA: Insufficient documentation

## 2018-10-09 DIAGNOSIS — Z7982 Long term (current) use of aspirin: Secondary | ICD-10-CM | POA: Insufficient documentation

## 2018-10-09 LAB — BASIC METABOLIC PANEL
Anion gap: 11 (ref 5–15)
BUN: 9 mg/dL (ref 6–20)
CO2: 28 mmol/L (ref 22–32)
Calcium: 8.9 mg/dL (ref 8.9–10.3)
Chloride: 103 mmol/L (ref 98–111)
Creatinine, Ser: 0.96 mg/dL (ref 0.44–1.00)
GFR calc Af Amer: >60 mL/min (ref >60)
GFR calc non Af Amer: >60 mL/min (ref >60)
Glucose, Bld: 94 mg/dL (ref 70–99)
Potassium: 4 mmol/L (ref 3.5–5.1)
Sodium: 142 mmol/L (ref 135–145)

## 2018-10-09 LAB — BRAIN NATRIURETIC PEPTIDE: B Natriuretic Peptide: 89.2 pg/mL (ref 0.0–100.0)

## 2018-10-09 MED ORDER — FUROSEMIDE 40 MG PO TABS: 40.0000 mg | ORAL_TABLET | Freq: Two times a day (BID) | ORAL | 6 refills | Status: DC

## 2018-10-09 MED ORDER — SPIRONOLACTONE 25 MG PO TABS: 25.0000 mg | ORAL_TABLET | Freq: Every day | ORAL | 4 refills | Status: DC

## 2018-10-09 NOTE — Patient Instructions (Signed)
Labs done today  Labs will need to be done in 10 days  INCREASE furosemide 40mg  (1 tab) twice daily  INCREASE Spironolactone 25mg  (1 tab) daily  Your physician has requested that you have an echocardiogram. Echocardiography is a painless test that uses sound waves to create images of your heart. It provides your doctor with information about the size and shape of your heart and how well your heart's chambers and valves are working. This procedure takes approximately one hour. There are no restrictions for this procedure.  Follow up with the [facility] in 3 weeks

## 2018-10-10 NOTE — Progress Notes (Signed)
Patient ID: Robin Owen, female   DOB: [birth year], 57 y.o.   MRN: [id number] PCP: Dr Altheimer Cardiology: Dr. Shirlee Latch  HPI: Robin Owen is a 57 y.o. Caucasian female with past medical history of HTN, HLD, DMII, hypothyroidism and fibromyalgia (on disability).   Admitted 11/16 through 09/17/15 with increased dyspnea. CXR with pulmonary edema. This prompted and ECHO that showed reduced EF 20%. RHC/LHC showed normal coronaries and reduced cardiac index. Cardiac MRI showed some mid-wall late gadolinium enhancement in the septum.  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 She has been lisinopril for some time. Started on coreg and lasix.  Discharge weight was 163 pounds.   She developed AKI and hyperkalemia in the setting of high dose Ibuprofen.  I asked her to stop this and started her on tramadol as needed instead.    Echo in 11/18 showed increase in EF to 50-55%.      She returns for followup of CHF.  She had to stop empagliflozin due to recurrent yeast infections.  Since stopping empagliflozin, she gained significant weight.  Lasix was increased to 40 mg daily.  She is short of breath walking up stairs but denies dyspnea walking flat ground.  No orthopnea/PND.  No chest pain.  Symptoms are unchanged .   Test/Procedures  09/15/2015 ECHO EF 20% MV mild regurgitation TV mild regurgitation  09/19/2015 CMRI 1. Severely dilated left ventricle with normal wall thickness and severe systolic dysfunction (LVEF 20-25%) with global hypokinesis and akinesis of the entire septal wall and diffuse mid wall late gadolinium enhancement. 2. Moderate RV dilatation with mild moderate systolic dysfunction. 3. Moderate left and right atrial dilatation. 4. Mild to moderate mitral and mild tricuspid regurgitation. 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  RHC/LHC 09/16/2015  RA 7 PCWP 29 CO 3.11 CI 1.7  Normal Cors  Echo (5/17) with EF 35-40%.    Echo (12/17) with EF 35-40%, mild LV dilation.   CPX (12/17) with RER 1.13, peak VO2 20.8, VE/VCO2 slope 30.  Echo (11/18): EF 50-55%.   Labs 09/15/2015: BNP 2354 Labs 09/16/2015: TSH 1.99 Labs 09/17/2015: K 3.8 Creatinine 0.91 Labs 09/29/2015: K 4.5 Creatinine 1.09 Labs 10/05/2015: K 5.3 Creatinine 1.21, LDL 74, HCT 37.2 Labs 1/17: SPEP negative Labs 2/17: K 3.9, creatinine 1.34, digoxin 0.7, hemoglobin 12.5 Labs 3/17: K 3.9, creatinine 1.25, hemoglobin 10 Labs 4/17: K 4.2, creatinine 1.15 Labs 5/17: K 3.8, creatinine 1.11, HCT 31.7 Labs 6/17: K 5.1, creatinine 0.98, BNP 45 Labs 12/17: K 5, creatinine 1.17, BNP 35 Labs 3/18: K 3.9, creatinine 1.07 Labs 8/18: K 4.3, creatinine 0.94 Labs 2/19: K 5.7, creatinine 1.39, Na 125, hgb 12.6, LDL 102 Labs 5/19: K 4.7, creatinine 1.12 Labs 9/19: K 4.3, creatinine 1.18  ROS: All systems negative except as listed in HPI, PMH and Problem List.  SH:  Social History   Socioeconomic History  . Marital status: Married    Spouse name: Not on file  . Number of children: Not on file  . Years of education: Not on file  . Highest education level: Not on file  Occupational History  . Not on file  Social Needs  . Financial resource strain: Not on file  . Food insecurity:    Worry: Not on file    Inability: Not on file  . Transportation needs:    Medical: Not on file    Non-medical: Not on file  Tobacco Use  . Smoking status: Never Smoker  . Smokeless tobacco: Never Used  Substance and Sexual Activity  . Alcohol use: No  . Drug use: No  . Sexual activity: Yes    Birth control/protection: None  Lifestyle  . Physical activity:    Days per week: Not on file    Minutes per session: Not on file  . Stress: Not on file  Relationships  . Social connections:    Talks on phone: Not on file    Gets together: Not on file    Attends religious service: Not on file    Active member of club or organization: Not on file    Attends meetings of  clubs or organizations: Not on file    Relationship status: Not on file  . Intimate partner violence:    Fear of current or ex partner: Not on file    Emotionally abused: Not on file    Physically abused: Not on file    Forced sexual activity: Not on file  Other Topics Concern  . Not on file  Social History Narrative  . Not on file    FH:  Family History  Problem Relation Age of Onset  . Pulmonary fibrosis Mother   . Hypertension Mother   . Atrial fibrillation Mother   . Hypertension Father   . Diabetes Mellitus II Father   . Kidney disease Father   . Diabetes Mellitus II Brother   . Hypertension Brother   . Atrial fibrillation Maternal Grandmother   . Hypertension Maternal Grandmother   . Stroke Maternal Grandmother     Past Medical History:  Diagnosis Date  . Anemia   . CHF (congestive heart failure) (HCC)   . Depression   . Diabetes mellitus without complication (HCC)   . Fibromyalgia   . Hyperlipidemia   . Hypertension   . Hypothyroidism     Current Outpatient Medications  Medication Sig Dispense Refill  . acetaminophen (TYLENOL) 325 MG tablet Take 650 mg by mouth every 6 (six) hours as needed for mild pain.    Marland Kitchen aspirin 81 MG tablet Take 81 mg by mouth daily.    Marland Kitchen atorvastatin (LIPITOR) 10 MG tablet Take 10 mg by mouth daily.    . carvedilol (COREG) 25 MG tablet TAKE 1 TABLET TWICE DAILY WITH A MEAL 180 tablet 1  . Cholecalciferol (VITAMIN D-1000 MAX ST) 1000 units tablet Take 1,000 Units by mouth daily.    Retia Passe 5 MG TABS tablet TAKE 1 TABLET (5 MG TOTAL) BY MOUTH 2 (TWO) TIMES DAILY WITH A MEAL. 60 tablet 11  . cyclobenzaprine (FLEXERIL) 10 MG tablet Take 10 mg by mouth 2 (two) times daily as needed for muscle spasms (pt takes qhs and PRN).     Marland Kitchen ENTRESTO 49-51 MG TAKE 1 TABLET BY MOUTH TWICE A DAY 60 tablet 3  . FLUoxetine (PROZAC) 20 MG tablet Take 20 mg by mouth daily.    . furosemide (LASIX) 40 MG tablet Take 1 tablet (40 mg total) by mouth 2 (two)  times daily. 60 tablet 6  . gabapentin (NEURONTIN) 600 MG tablet Take 300-600 mg by mouth 3 (three) times daily as needed (for fibromyalgia). Take 1/2 tablet (300mg ) by mouth in the morning, and afternonn, and 1 tablet (600mg ) at bedtime    . Insulin Glargine, 2 Unit Dial, (TOUJEO MAX SOLOSTAR) 300 UNIT/ML SOPN Inject 28 Units into the skin daily.    . Iron-FA-B Cmp-C-Biot-Probiotic (FUSION PLUS PO) Take 1 capsule by mouth daily.    Marland Kitchen levothyroxine (SYNTHROID, LEVOTHROID) 88 MCG tablet Take  88 mcg by mouth daily before breakfast.    . Liraglutide (VICTOZA) 18 MG/3ML SOPN Inject 1.8 mg into the skin daily.    . metFORMIN (GLUCOPHAGE) 1000 MG tablet Take 1 tablet (1,000 mg total) by mouth 2 (two) times daily with a meal.    . Methylcellulose, Laxative, (MIRAFIBER) 500 MG TABS Take 500 mg by mouth daily.    . Multiple Vitamin (MULTIVITAMIN WITH MINERALS) TABS tablet Take 1 tablet by mouth daily.    Marland Kitchen NOVOTWIST 32G X 5 MM MISC Use as directed with Victozia.  2  . ONETOUCH VERIO test strip Check blood glucose once daily  2  . spironolactone (ALDACTONE) 25 MG tablet Take 0.5 tablets (12.5 mg total) by mouth daily. 15 tablet 3  . spironolactone (ALDACTONE) 25 MG tablet Take 1 tablet (25 mg total) by mouth daily. 45 tablet 4  . traMADol (ULTRAM) 50 MG tablet Take 50 mg by mouth every 6 (six) hours as needed for moderate pain (pt takes qhs and PRN).      No current facility-administered medications for this encounter.     Vitals:   10/09/18 1208  BP: 138/82  Pulse: 68  SpO2: 98%  Weight: 83.6 kg (184 lb 6.4 oz)    PHYSICAL EXAM: General: NAD Neck: JVP 8-9 cm, no thyromegaly or thyroid nodule.  Lungs: Clear to auscultation bilaterally with normal respiratory effort. CV: Nondisplaced PMI.  Heart regular S1/S2, no S3/S4, no murmur.  No peripheral edema.  No carotid bruit.  Normal pedal pulses.  Abdomen: Soft, nontender, no hepatosplenomegaly, no distention.  Skin: Intact without lesions or  rashes.  Neurologic: Alert and oriented x 3.  Psych: Normal affect. Extremities: No clubbing or cyanosis.  HEENT: Normal.   ASSESSMENT & PLAN: 1. Chronic systolic CHF: 09/15/2015 ECHO EF 20%. Had cMRI EF 20-25%, RV mod dilated.  cMRI (11/16) showed an area of mid-wall septal late gadolinium enhancement. NICM perhaps related to HTN versus viral myocarditis. SPEP negative.  09/16/2015 RHC/LHC coronaries ok, low cardiac index 1.7.  Repeat echo in 5/17 and again in 12/17 showed some improvement, EF 35-40%.  CPX in 12/17 showed low normal peak VO2 with no clear cardiopulmonary limitation. Most recent echo in 11/18 showed EF up to 50-55%.   NYHA class II symptoms now but she appears at least mildly volume overloaded, she is taking Lasix 40 qam/20 qpm.  She had to stop empagliflozin due to recurrent yeast infections, stopping this may have contributed to volume overload.   - Increase Lasix to 40 mg bid, BMET/BNP today and again in 10 days.   - Increase spironolactone to 25 mg daily.   - Continue Coreg 25 mg bid.  - Continue Entresto to 49/51 bid.   - Can continue ivabradine 5 mg bid.  - EF out of range for ICD.   - I will obtain repeat echo given increased volume overload.  2. Fibromyalgia: Disabled since 1996  3. Diabetes: Off empagliflozin due to recurrent yeast infection.   Followup in 3 wks with NP/PA.   Marca Ancona 10/10/2018

## 2018-10-24 ENCOUNTER — Ambulatory Visit (HOSPITAL_BASED_OUTPATIENT_CLINIC_OR_DEPARTMENT_OTHER)
Admission: RE | Admit: 2018-10-24 | Discharge: 2018-10-24 | Disposition: A | Discharge status: Home / Self Care | Payer: Managed Care, Other (non HMO) | Source: Ambulatory Visit | Attending: Cardiology | Admitting: Cardiology

## 2018-10-24 ENCOUNTER — Ambulatory Visit (HOSPITAL_COMMUNITY)
Admission: RE | Admit: 2018-10-24 | Discharge: 2018-10-24 | Disposition: A | Discharge status: Home / Self Care | Payer: Managed Care, Other (non HMO) | Source: Ambulatory Visit | Attending: Internal Medicine | Admitting: Internal Medicine

## 2018-10-24 DIAGNOSIS — E785 Hyperlipidemia, unspecified: Secondary | ICD-10-CM | POA: Diagnosis not present

## 2018-10-24 DIAGNOSIS — E669 Obesity, unspecified: Secondary | ICD-10-CM | POA: Diagnosis not present

## 2018-10-24 DIAGNOSIS — I110 Hypertensive heart disease with heart failure: Secondary | ICD-10-CM | POA: Diagnosis not present

## 2018-10-24 DIAGNOSIS — I428 Other cardiomyopathies: Secondary | ICD-10-CM | POA: Insufficient documentation

## 2018-10-24 DIAGNOSIS — I5022 Chronic systolic (congestive) heart failure: Secondary | ICD-10-CM

## 2018-10-24 DIAGNOSIS — E119 Type 2 diabetes mellitus without complications: Secondary | ICD-10-CM | POA: Insufficient documentation

## 2018-10-24 DIAGNOSIS — Z6830 Body mass index (BMI) 30.0-30.9, adult: Secondary | ICD-10-CM | POA: Insufficient documentation

## 2018-10-24 LAB — BASIC METABOLIC PANEL
Anion gap: 11 (ref 5–15)
BUN: 10 mg/dL (ref 6–20)
CO2: 30 mmol/L (ref 22–32)
Calcium: 9.5 mg/dL (ref 8.9–10.3)
Chloride: 100 mmol/L (ref 98–111)
Creatinine, Ser: 1.09 mg/dL — ABNORMAL HIGH (ref 0.44–1.00)
GFR calc Af Amer: >60 mL/min (ref >60)
GFR calc non Af Amer: 57 mL/min — ABNORMAL LOW (ref >60)
Glucose, Bld: 112 mg/dL — ABNORMAL HIGH (ref 70–99)
Potassium: 4.1 mmol/L (ref 3.5–5.1)
Sodium: 141 mmol/L (ref 135–145)

## 2018-10-24 NOTE — Progress Notes (Signed)
  Echocardiogram 2D Echocardiogram has been performed.  Robin Owen 10/24/2018, 3:41 PM

## 2018-10-27 ENCOUNTER — Telehealth (HOSPITAL_COMMUNITY): Payer: Self-pay

## 2018-10-27 NOTE — Telephone Encounter (Signed)
Pt called back and was given echo results

## 2018-10-27 NOTE — Telephone Encounter (Signed)
Pt called to go over echo results no answer voice mail left for pt to call back EF 45-50%, mildly decreased

## 2018-10-30 ENCOUNTER — Ambulatory Visit (HOSPITAL_COMMUNITY)
Admission: RE | Admit: 2018-10-30 | Discharge: 2018-10-30 | Disposition: A | Discharge status: Home / Self Care | Payer: Managed Care, Other (non HMO) | Source: Ambulatory Visit | Attending: Cardiology | Admitting: Cardiology

## 2018-10-30 ENCOUNTER — Encounter (HOSPITAL_COMMUNITY): Payer: Self-pay

## 2018-10-30 VITALS — BP 118/76 | HR 74 | Wt 182.2 lb

## 2018-10-30 DIAGNOSIS — Z833 Family history of diabetes mellitus: Secondary | ICD-10-CM | POA: Insufficient documentation

## 2018-10-30 DIAGNOSIS — Z7982 Long term (current) use of aspirin: Secondary | ICD-10-CM | POA: Diagnosis not present

## 2018-10-30 DIAGNOSIS — Z79899 Other long term (current) drug therapy: Secondary | ICD-10-CM | POA: Diagnosis not present

## 2018-10-30 DIAGNOSIS — I110 Hypertensive heart disease with heart failure: Secondary | ICD-10-CM | POA: Insufficient documentation

## 2018-10-30 DIAGNOSIS — Z79890 Hormone replacement therapy (postmenopausal): Secondary | ICD-10-CM | POA: Insufficient documentation

## 2018-10-30 DIAGNOSIS — Z794 Long term (current) use of insulin: Secondary | ICD-10-CM | POA: Insufficient documentation

## 2018-10-30 DIAGNOSIS — E875 Hyperkalemia: Secondary | ICD-10-CM | POA: Diagnosis not present

## 2018-10-30 DIAGNOSIS — R0683 Snoring: Secondary | ICD-10-CM | POA: Diagnosis not present

## 2018-10-30 DIAGNOSIS — E785 Hyperlipidemia, unspecified: Secondary | ICD-10-CM | POA: Insufficient documentation

## 2018-10-30 DIAGNOSIS — I5022 Chronic systolic (congestive) heart failure: Secondary | ICD-10-CM

## 2018-10-30 DIAGNOSIS — R0602 Shortness of breath: Secondary | ICD-10-CM | POA: Diagnosis not present

## 2018-10-30 DIAGNOSIS — E119 Type 2 diabetes mellitus without complications: Secondary | ICD-10-CM | POA: Diagnosis not present

## 2018-10-30 DIAGNOSIS — I10 Essential (primary) hypertension: Secondary | ICD-10-CM | POA: Diagnosis present

## 2018-10-30 DIAGNOSIS — Z8249 Family history of ischemic heart disease and other diseases of the circulatory system: Secondary | ICD-10-CM | POA: Insufficient documentation

## 2018-10-30 DIAGNOSIS — I428 Other cardiomyopathies: Secondary | ICD-10-CM | POA: Diagnosis not present

## 2018-10-30 DIAGNOSIS — E039 Hypothyroidism, unspecified: Secondary | ICD-10-CM | POA: Insufficient documentation

## 2018-10-30 DIAGNOSIS — M797 Fibromyalgia: Secondary | ICD-10-CM | POA: Insufficient documentation

## 2018-10-30 NOTE — Progress Notes (Signed)
Patient ID: Robin Owen, female   DOB: September 28, 1961, 58 y.o.   MRN: [id number] PCP: Dr Altheimer Cardiology: Dr. Shirlee Latch  HPI: Robin Owen is a 58 y.o. Caucasian female with past medical history of HTN, HLD, DMII, hypothyroidism and fibromyalgia (on disability).   Admitted 11/16 through 09/17/15 with increased dyspnea. CXR with pulmonary edema. This prompted and ECHO that showed reduced EF 20%. RHC/LHC showed normal coronaries and reduced cardiac index. Cardiac MRI showed some mid-wall late gadolinium enhancement in the septum.  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 She has been lisinopril for some time. Started on coreg and lasix.  Discharge weight was 163 pounds.   She developed AKI and hyperkalemia in the setting of high dose Ibuprofen.  I asked her to stop this and started her on tramadol as needed instead.    Echo in 11/18 showed increase in EF to 50-55%.      Echo 10/24/18 LVEF 45-50%, Grade 1 DD, Mild MR, PA peak pressure 24 mm Hg  She presents today for regular follow up. She feels about the same since last visit. Weight remains up about 10 lbs from June of this year and since coming off of Empagliflozin. She is doing well on lasix 40 mg BID, labs last week stable. She remains SOB walking up stairs, but denies DOE on flat ground. Gets around a grocery store without difficulty. Mild fatigue after bathing. She naps very easily if she stops and sits down for awhile. Also snores heavily per her husband. She is taking all medication as directed. Hasn't needed any additional lasix.   Test/Procedures  09/15/2015 ECHO EF 20% MV mild regurgitation TV mild regurgitation  09/19/2015 CMRI 1. Severely dilated left ventricle with normal wall thickness and severe systolic dysfunction (LVEF 20-25%) with global hypokinesis and akinesis of the entire septal wall and diffuse mid wall late gadolinium enhancement. 2. Moderate RV dilatation with mild moderate systolic dysfunction. 3. Moderate left and right atrial  dilatation. 4. Mild to moderate mitral and mild tricuspid regurgitation. 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  RHC/LHC 09/16/2015  RA 7 PCWP 29 CO 3.11 CI 1.7  Normal Cors  Echo (5/17) with EF 35-40%.   Echo (12/17) with EF 35-40%, mild LV dilation.   CPX (12/17) with RER 1.13, peak VO2 20.8, VE/VCO2 slope 30.  Echo (11/18): EF 50-55%.   Labs 09/15/2015: BNP 2354 Labs 09/16/2015: TSH 1.99 Labs 09/17/2015: K 3.8 Creatinine 0.91 Labs 09/29/2015: K 4.5 Creatinine 1.09 Labs 10/05/2015: K 5.3 Creatinine 1.21, LDL 74, HCT 37.2 Labs 1/17: SPEP negative Labs 2/17: K 3.9, creatinine 1.34, digoxin 0.7, hemoglobin 12.5 Labs 3/17: K 3.9, creatinine 1.25, hemoglobin 10 Labs 4/17: K 4.2, creatinine 1.15 Labs 5/17: K 3.8, creatinine 1.11, HCT 31.7 Labs 6/17: K 5.1, creatinine 0.98, BNP 45 Labs 12/17: K 5, creatinine 1.17, BNP 35 Labs 3/18: K 3.9, creatinine 1.07 Labs 8/18: K 4.3, creatinine 0.94 Labs 2/19: K 5.7, creatinine 1.39, Na 125, hgb 12.6, LDL 102 Labs 5/19: K 4.7, creatinine 1.12 Labs 9/19: K 4.3, creatinine 1.18  Review of systems complete and found to be negative unless listed in HPI.    SH:  Social History   Socioeconomic History  . Marital status: Married    Spouse name: Not on file  . Number of children: Not on file  . Years of education: Not on file  . Highest education level: Not on file  Occupational History  . Not on file  Social Needs  . Financial  resource strain: Not on file  . Food insecurity:    Worry: Not on file    Inability: Not on file  . Transportation needs:    Medical: Not on file    Non-medical: Not on file  Tobacco Use  . Smoking status: Never Smoker  . Smokeless tobacco: Never Used  Substance and Sexual Activity  . Alcohol use: No  . Drug use: No  . Sexual activity: Yes    Birth control/protection: None  Lifestyle  . Physical activity:    Days per week:  Not on file    Minutes per session: Not on file  . Stress: Not on file  Relationships  . Social connections:    Talks on phone: Not on file    Gets together: Not on file    Attends religious service: Not on file    Active member of club or organization: Not on file    Attends meetings of clubs or organizations: Not on file    Relationship status: Not on file  . Intimate partner violence:    Fear of current or ex partner: Not on file    Emotionally abused: Not on file    Physically abused: Not on file    Forced sexual activity: Not on file  Other Topics Concern  . Not on file  Social History Narrative  . Not on file    FH:  Family History  Problem Relation Age of Onset  . Pulmonary fibrosis Mother   . Hypertension Mother   . Atrial fibrillation Mother   . Hypertension Father   . Diabetes Mellitus II Father   . Kidney disease Father   . Diabetes Mellitus II Brother   . Hypertension Brother   . Atrial fibrillation Maternal Grandmother   . Hypertension Maternal Grandmother   . Stroke Maternal Grandmother     Past Medical History:  Diagnosis Date  . Anemia   . CHF (congestive heart failure) (HCC)   . Depression   . Diabetes mellitus without complication (HCC)   . Fibromyalgia   . Hyperlipidemia   . Hypertension   . Hypothyroidism     Current Outpatient Medications  Medication Sig Dispense Refill  . acetaminophen (TYLENOL) 325 MG tablet Take 650 mg by mouth every 6 (six) hours as needed for mild pain.    Marland Kitchen aspirin 81 MG tablet Take 81 mg by mouth daily.    Marland Kitchen atorvastatin (LIPITOR) 10 MG tablet Take 10 mg by mouth daily.    . carvedilol (COREG) 25 MG tablet TAKE 1 TABLET TWICE DAILY WITH A MEAL 180 tablet 1  . Cholecalciferol (VITAMIN D-1000 MAX ST) 1000 units tablet Take 1,000 Units by mouth daily.    Retia Passe 5 MG TABS tablet TAKE 1 TABLET (5 MG TOTAL) BY MOUTH 2 (TWO) TIMES DAILY WITH A MEAL. 60 tablet 11  . cyclobenzaprine (FLEXERIL) 10 MG tablet Take 10 mg by  mouth 2 (two) times daily as needed for muscle spasms (pt takes qhs and PRN).     Marland Kitchen ENTRESTO 49-51 MG TAKE 1 TABLET BY MOUTH TWICE A DAY 60 tablet 3  . FLUoxetine (PROZAC) 20 MG tablet Take 20 mg by mouth daily.    . furosemide (LASIX) 40 MG tablet Take 1 tablet (40 mg total) by mouth 2 (two) times daily. 60 tablet 6  . gabapentin (NEURONTIN) 600 MG tablet Take 300-600 mg by mouth 3 (three) times daily as needed (for fibromyalgia). Take 1/2 tablet (300mg ) by mouth in the  morning, and afternonn, and 1 tablet (600mg ) at bedtime    . Insulin Glargine, 2 Unit Dial, (TOUJEO MAX SOLOSTAR) 300 UNIT/ML SOPN Inject 28 Units into the skin daily.    . Iron-FA-B Cmp-C-Biot-Probiotic (FUSION PLUS PO) Take 1 capsule by mouth daily.    Marland Kitchen. levothyroxine (SYNTHROID, LEVOTHROID) 88 MCG tablet Take 88 mcg by mouth daily before breakfast.    . Liraglutide (VICTOZA) 18 MG/3ML SOPN Inject 1.8 mg into the skin daily.    . metFORMIN (GLUCOPHAGE) 1000 MG tablet Take 1 tablet (1,000 mg total) by mouth 2 (two) times daily with a meal.    . Methylcellulose, Laxative, (MIRAFIBER) 500 MG TABS Take 500 mg by mouth daily.    . Multiple Vitamin (MULTIVITAMIN WITH MINERALS) TABS tablet Take 1 tablet by mouth daily.    Marland Kitchen. NOVOTWIST 32G X 5 MM MISC Use as directed with Victozia.  2  . ONETOUCH VERIO test strip Check blood glucose once daily  2  . spironolactone (ALDACTONE) 25 MG tablet Take 1 tablet (25 mg total) by mouth daily. 45 tablet 4  . traMADol (ULTRAM) 50 MG tablet Take 50 mg by mouth every 6 (six) hours as needed for moderate pain (pt takes qhs and PRN).      No current facility-administered medications for this encounter.    Vitals:   10/30/18 1436  BP: 118/76  Pulse: 74  SpO2: 98%  Weight: 82.6 kg (182 lb 3.2 oz)    Wt Readings from Last 3 Encounters:  10/30/18 82.6 kg (182 lb 3.2 oz)  10/09/18 83.6 kg (184 lb 6.4 oz)  04/08/18 78.7 kg (173 lb 8 oz)    PHYSICAL EXAM: General: NAD  HEENT: Normal Neck:  Supple. JVP 6-7 cm. Carotids 2+ bilat; no bruits. No thyromegaly or nodule noted. Cor: PMI nondisplaced. RRR, No M/G/R noted Lungs: CTAB, normal effort. Abdomen: Soft, non-tender, non-distended, no HSM. No bruits or masses. +BS  Extremities: No cyanosis, clubbing, or rash. R and LLE no edema.  Neuro: Alert & orientedx3, cranial nerves grossly intact. moves all 4 extremities w/o difficulty. Affect pleasant   ASSESSMENT & PLAN: 1. Chronic systolic CHF: 09/15/2015 ECHO EF 20%. Had cMRI EF 20-25%, RV mod dilated.  cMRI (11/16) showed an area of mid-wall septal late gadolinium enhancement. NICM perhaps related to HTN versus viral myocarditis. SPEP negative.  09/16/2015 RHC/LHC coronaries ok, low cardiac index 1.7.  Repeat echo in 5/17 and again in 12/17 showed some improvement, EF 35-40%.  CPX in 12/17 showed low normal peak VO2 with no clear cardiopulmonary limitation. Most recent echo in 11/18 showed EF up to 50-55%.   NYHA class II-III symptoms chronically. She had to stop empagliflozin due to recurrent yeast infections, stopping this may have contributed to volume overload.  Her volume status looks OK today.  - Continue lasix 40 mg BID. BMET today.  - Continue spironolactone 25 mg daily.   - Continue Coreg 25 mg BID - Continue Entresto 49/51 mg BID.    - Continue ivabradine 5 mg BID - EF out of range for ICD.   2. Fibromyalgia:  - Disabled since 1996. No change.  3. Diabetes:  - Off empagliflozin due to recurrent yeast infection.  - Further per PCP.  4. Snoring - STOPBANG is at least 4 (neck circumference not measured), pointing towards at least intermediate risk of sleep apnea with Snoring, daytime fatigue, age > 50, and HTN.  - Will refer for sleep study.   Labs 10/24/18 stable. Volume status looks  OK.  Will refer for sleep study. Echo reviewed personally with patients and all questions answered. RTC 3 months. Sooner with symptoms.   Graciella Freer, PA-C  10/30/2018  Greater than  50% of the 25 minute visit was spent in counseling/coordination of care regarding disease state education, salt/fluid restriction, sliding scale diuretics, and medication compliance.

## 2018-10-30 NOTE — Patient Instructions (Signed)
Your physician has recommended that you have a sleep study. This test records several body functions during sleep, including: brain activity, eye movement, oxygen and carbon dioxide blood levels, heart rate and rhythm, breathing rate and rhythm, the flow of air through your mouth and nose, snoring, body muscle movements, and chest and belly movement.  Your physician recommends that you schedule a follow-up appointment in: 3 months with Dr Shirlee Latch  Do the following things EVERYDAY: 1) Weigh yourself in the morning before breakfast. Write it down and keep it in a log. 2) Take your medicines as prescribed 3) Eat low salt foods-Limit salt (sodium) to 2000 mg per day.  4) Stay as active as you can everyday 5) Limit all fluids for the day to less than 2 liters

## 2018-12-10 ENCOUNTER — Telehealth (HOSPITAL_COMMUNITY): Payer: Self-pay

## 2018-12-10 NOTE — Telephone Encounter (Signed)
Prior authorization through [employer] insurance company was initiated for entresto 49-51 medication and sent via [facility] on 12/10/2018.   RESULT: no prior authorization required.

## 2018-12-17 ENCOUNTER — Encounter (HOSPITAL_BASED_OUTPATIENT_CLINIC_OR_DEPARTMENT_OTHER): Payer: Managed Care, Other (non HMO)

## 2018-12-22 ENCOUNTER — Other Ambulatory Visit (HOSPITAL_COMMUNITY): Payer: Self-pay | Admitting: Cardiology

## 2019-03-06 ENCOUNTER — Other Ambulatory Visit: Payer: Self-pay

## 2019-03-06 ENCOUNTER — Ambulatory Visit (HOSPITAL_COMMUNITY)
Admission: RE | Admit: 2019-03-06 | Discharge: 2019-03-06 | Disposition: A | Discharge status: Home / Self Care | Payer: Managed Care, Other (non HMO) | Source: Ambulatory Visit | Attending: Cardiology | Admitting: Cardiology

## 2019-03-06 DIAGNOSIS — I5022 Chronic systolic (congestive) heart failure: Secondary | ICD-10-CM | POA: Diagnosis not present

## 2019-03-06 NOTE — Patient Instructions (Addendum)
Lab work needs to be done next week. This is scheduled for May 14th at 11:15am.  Your physician recommends that you have a home sleep study done. Someone will contact you in order to set this up.  Please follow up with Dr. Shirlee Latch in 4 months. This is scheduled for Sept 9th at 2:20pm.

## 2019-03-06 NOTE — Progress Notes (Signed)
Spoke to pt. Pt agreeable to follow up appointments and sleep study.

## 2019-03-08 NOTE — Progress Notes (Signed)
Heart Failure TeleHealth Note  Due to national recommendations of social distancing due to COVID 19, Audio/video telehealth visit is felt to be most appropriate for this patient at this time.  See MyChart message from today for patient consent regarding telehealth for [facility].  Date:  03/08/2019   ID:  Robin Owen, DOB [birth year], MRN [id number]  Location: Home  Provider location: [facility] Type of Visit: Established patient  PCP:  Hali MarryAltheimer, Michael, MD  Cardiologist:  Dr. Shirlee LatchMcLean  Chief Complaint: Shortness of breath   History of Present Illness: Robin Owen is a 58 y.o. female who presents via audio/video conferencing for a telehealth visit today.     she denies symptoms worrisome for COVID 19.   Patient has a past medical history of HTN, HLD, DMII, hypothyroidism and fibromyalgia (on disability).   Admitted 11/16 through 09/17/15 with increased dyspnea. CXR with pulmonary edema. This prompted and ECHO that showed reduced EF 20%. RHC/LHC showed normal coronaries and reduced cardiac index. Cardiac MRI showed some mid-wall late gadolinium enhancement in the septum.  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 She has been lisinopril for some time. Started on coreg and lasix.  Discharge weight was 163 pounds.   She developed AKI and hyperkalemia in the setting of high dose Ibuprofen.  I asked her to stop this and started her on tramadol as needed instead.    Echo in 11/18 showed increase in EF to 50-55%.      Echo 10/24/18 LVEF 45-50%, Grade 1 DD, Mild MR, PA peak pressure 24 mm Hg  She has been stable symptomatically.  No dyspnea walking on flat ground.  She is short of breath walking up a flight of stairs.  Weight is stable.  No lightheadedness.  No chest pain. No orthopnea/PND.  She does snore and has daytime sleepiness.  She uses her treadmill for exercise at home.    Test/Procedures  09/15/2015 ECHO EF 20% MV mild regurgitation TV mild regurgitation   09/19/2015 CMRI 1. Severely dilated left ventricle with normal wall thickness and severe systolic dysfunction (LVEF 20-25%) with global hypokinesis and akinesis of the entire septal wall and diffuse mid wall late gadolinium enhancement. 2. Moderate RV dilatation with mild moderate systolic dysfunction. 3. Moderate left and right atrial dilatation. 4. Mild to moderate mitral and mild tricuspid regurgitation. 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  RHC/LHC 09/16/2015  RA 7 PCWP 29 CO 3.11 CI 1.7  Normal Cors  Echo (5/17) with EF 35-40%.   Echo (12/17) with EF 35-40%, mild LV dilation.   CPX (12/17) with RER 1.13, peak VO2 20.8, VE/VCO2 slope 30.  Echo (11/18): EF 50-55%.   Labs 09/15/2015: BNP 2354 Labs 09/16/2015: TSH 1.99 Labs 09/17/2015: K 3.8 Creatinine 0.91 Labs 09/29/2015: K 4.5 Creatinine 1.09 Labs 10/05/2015: K 5.3 Creatinine 1.21, LDL 74, HCT 37.2 Labs 1/17: SPEP negative Labs 2/17: K 3.9, creatinine 1.34, digoxin 0.7, hemoglobin 12.5 Labs 3/17: K 3.9, creatinine 1.25, hemoglobin 10 Labs 4/17: K 4.2, creatinine 1.15 Labs 5/17: K 3.8, creatinine 1.11, HCT 31.7 Labs 6/17: K 5.1, creatinine 0.98, BNP 45 Labs 12/17: K 5, creatinine 1.17, BNP 35 Labs 3/18: K 3.9, creatinine 1.07 Labs 8/18: K 4.3, creatinine 0.94 Labs 2/19: K 5.7, creatinine 1.39, Na 125, hgb 12.6, LDL 102 Labs 5/19: K 4.7, creatinine 1.12 Labs 12/19: K 4.1, creatinine 1.09  Past Medical History:  Diagnosis Date  . Anemia   . CHF (congestive  heart failure) (HCC)   . Depression   . Diabetes mellitus without complication (HCC)   . Fibromyalgia   . Hyperlipidemia   . Hypertension   . Hypothyroidism      Current Outpatient Medications  Medication Sig Dispense Refill  . acetaminophen (TYLENOL) 325 MG tablet Take 650 mg by mouth every 6 (six) hours as needed for mild pain.    Marland Kitchen aspirin 81 MG tablet Take 81 mg  by mouth daily.    Marland Kitchen atorvastatin (LIPITOR) 10 MG tablet Take 10 mg by mouth daily.    . carvedilol (COREG) 25 MG tablet TAKE 1 TABLET TWICE DAILY WITH A MEAL 180 tablet 1  . Cholecalciferol (VITAMIN D-1000 MAX ST) 1000 units tablet Take 1,000 Units by mouth daily.    Retia Passe 5 MG TABS tablet TAKE 1 TABLET (5 MG TOTAL) BY MOUTH 2 (TWO) TIMES DAILY WITH A MEAL. 60 tablet 11  . cyclobenzaprine (FLEXERIL) 10 MG tablet Take 10 mg by mouth 2 (two) times daily as needed for muscle spasms (pt takes qhs and PRN).     Marland Kitchen ENTRESTO 49-51 MG TAKE 1 TABLET BY MOUTH TWICE A DAY 60 tablet 3  . FLUoxetine (PROZAC) 20 MG tablet Take 20 mg by mouth daily.    . furosemide (LASIX) 40 MG tablet Take 1 tablet (40 mg total) by mouth 2 (two) times daily. 60 tablet 6  . gabapentin (NEURONTIN) 600 MG tablet Take 300-600 mg by mouth 3 (three) times daily as needed (for fibromyalgia). Take 1/2 tablet (300mg ) by mouth in the morning, and afternonn, and 1 tablet (600mg ) at bedtime    . Insulin Glargine, 2 Unit Dial, (TOUJEO MAX SOLOSTAR) 300 UNIT/ML SOPN Inject 28 Units into the skin daily.    . Iron-FA-B Cmp-C-Biot-Probiotic (FUSION PLUS PO) Take 1 capsule by mouth daily.    Marland Kitchen levothyroxine (SYNTHROID, LEVOTHROID) 88 MCG tablet Take 88 mcg by mouth daily before breakfast.    . Liraglutide (VICTOZA) 18 MG/3ML SOPN Inject 1.8 mg into the skin daily.    . metFORMIN (GLUCOPHAGE) 1000 MG tablet Take 1 tablet (1,000 mg total) by mouth 2 (two) times daily with a meal.    . Methylcellulose, Laxative, (MIRAFIBER) 500 MG TABS Take 500 mg by mouth daily.    . Multiple Vitamin (MULTIVITAMIN WITH MINERALS) TABS tablet Take 1 tablet by mouth daily.    Marland Kitchen NOVOTWIST 32G X 5 MM MISC Use as directed with Victozia.  2  . ONETOUCH VERIO test strip Check blood glucose once daily  2  . spironolactone (ALDACTONE) 25 MG tablet Take 1 tablet (25 mg total) by mouth daily. 45 tablet 4  . traMADol (ULTRAM) 50 MG tablet Take 50 mg by mouth every 6  (six) hours as needed for moderate pain (pt takes qhs and PRN).      No current facility-administered medications for this encounter.     Allergies:   Macrobid [nitrofurantoin]   Social History:  The patient  reports that she has never smoked. She has never used smokeless tobacco. She reports that she does not drink alcohol or use drugs.   Family History:  The patient's family history includes Atrial fibrillation in her maternal grandmother and mother; Diabetes Mellitus II in her brother and father; Hypertension in her brother, father, maternal grandmother, and mother; Kidney disease in her father; Pulmonary fibrosis in her mother; Stroke in her maternal grandmother.   ROS:  Please see the history of present illness.   All other systems are  personally reviewed and negative.   Exam:  (Video/Tele Health Call; Exam is subjective and or/visual.) General:  Speaks in full sentences. No resp difficulty. Neck: No JVD Lungs: Normal respiratory effort with conversation.  Abdomen: Non-distended per patient report Extremities: Pt denies edema. Neuro: Alert & oriented x 3.   Recent Labs: 10/09/2018: B Natriuretic Peptide 89.2 10/24/2018: BUN 10; Creatinine, Ser 1.09; Potassium 4.1; Sodium 141  Personally reviewed   Wt Readings from Last 3 Encounters:  10/30/18 82.6 kg (182 lb 3.2 oz)  10/09/18 83.6 kg (184 lb 6.4 oz)  04/08/18 78.7 kg (173 lb 8 oz)      ASSESSMENT AND PLAN:  1. Chronic systolic CHF: 09/15/2015 ECHO EF 20%. Had cMRI EF 20-25%, RV mod dilated.  cMRI (11/16) showed an area of mid-wall septal late gadolinium enhancement. NICM perhaps related to HTN versus viral myocarditis. SPEP negative.  09/16/2015 RHC/LHC coronaries ok, low cardiac index 1.7.  Repeat echo in 5/17 and again in 12/17 showed some improvement, EF 35-40%.  CPX in 12/17 showed low normal peak VO2 with no clear cardiopulmonary limitation. Echo in 11/18 showed EF up to 50-55%, echo in 12/19 showed EF 45-50%.   NYHA  class II symptoms chronically. She does not appear volume overloaded and weight is stable.   - Continue lasix 40 mg BID. Arrange BMET.  - Continue spironolactone 25 mg daily.   - Continue Coreg 25 mg BID - Continue Entresto 49/51 mg BID.    - Continue ivabradine 5 mg BID - EF out of range for ICD.   - Repeat echo in 12/20.  2. Fibromyalgia: Disabled since 1996. No change.  3. Diabetes: Off empagliflozin due to recurrent yeast infection.  4. OSA: Suspect OSA with daytime sleepiness and snoring.  I will try to arrange a home sleep study.   COVID screen The patient does not have any symptoms that suggest any further testing/ screening at this time.  Social distancing reinforced today.  Patient Risk: After full review of this patients clinical status, I feel that they are at moderate risk for cardiac decompensation at this time.  Relevant cardiac medications were reviewed at length with the patient today. The patient does not have concerns regarding their medications at this time.   Recommended follow-up:  4 months  Today, I have spent 16 minutes with the patient with telehealth technology discussing the above issues .    Signed, Marca Ancona, MD  03/08/2019  [facility] [address] [facility] [place] [address] [phone] (office) [phone] (fax)

## 2019-03-08 NOTE — Addendum Note (Signed)
Encounter addended by: Laurey Morale, MD on: 03/08/2019 10:58 PM  Actions taken: Charge Capture section accepted

## 2019-03-11 ENCOUNTER — Other Ambulatory Visit (HOSPITAL_COMMUNITY): Payer: Self-pay | Admitting: Cardiology

## 2019-03-11 ENCOUNTER — Telehealth (HOSPITAL_COMMUNITY): Payer: Self-pay

## 2019-03-11 ENCOUNTER — Telehealth (HOSPITAL_COMMUNITY): Payer: Self-pay | Admitting: *Deleted

## 2019-03-11 NOTE — Telephone Encounter (Signed)
Order, stop bang, OV note, and demographics faxed to [facility]

## 2019-03-11 NOTE — Telephone Encounter (Signed)
  Height: 5'5    Weight:175.4lbs BMI:29.19 STOP BANG RISK ASSESSMENT S (snore) Have you been told that you snore?     YES   T (tired) Are you often tired, fatigued, or sleepy during the day?   YES  O (obstruction) Do you stop breathing, choke, or gasp during sleep? NO   P (pressure) Do you have or are you being treated for high blood pressure? YES   B (BMI) Is your body index greater than 35 kg/m? NO   A (age) Are you 56 years old or older? YES   N (neck) Do you have a neck circumference greater than 16 inches?   YES/NO   G (gender) Are you a female? NO   TOTAL STOP/BANG "YES" ANSWERS 4                                                                       For Office Use Only              Procedure Order Form    YES to 3+ Stop Bang questions OR two clinical symptoms - patient qualifies for WatchPAT (CPT 95800)     Submit: This Form + Patient Face Sheet + Clinical Note via CloudPAT or Fax: [phone]         Clinical Notes: Will consult Sleep Specialist and refer for management of therapy due to patient increased risk of Sleep Apnea. Ordering a sleep study due to the following two clinical symptoms: Excessive daytime sleepiness G47.10 / Gastroesophageal reflux K21.9 / Nocturia R35.1 / Morning Headaches G44.221 / Difficulty concentrating R41.840 / Memory problems or poor judgment G31.84 / Personality changes or irritability R45.4 / Loud snoring R06.83 / Depression F32.9 / Unrefreshed by sleep G47.8 / Impotence N52.9 / History of high blood pressure R03.0 / Insomnia G47.00    I understand that I am proceeding with a home sleep apnea test as ordered by my treating physician. I understand that untreated sleep apnea is a serious cardiovascular risk factor and it is my responsibility to perform the test and seek management for sleep apnea. I will be contacted with the results and be managed for sleep apnea by a local sleep physician. I will be receiving equipment and further instructions  from [facility]. I shall promptly ship back the equipment via the included mailing label. I understand my insurance will be billed for the test and as the patient I am responsible for any insurance related out-of-pocket costs incurred. I have been provided with written instructions and can call for additional video or telephonic instruction, with 24-hour availability of qualified personnel to answer any questions: Patient Help Desk [phone].  Patient Signature ______________________________________________________   Date______________________ Patient Telemedicine Verbal Consent

## 2019-03-12 ENCOUNTER — Ambulatory Visit (HOSPITAL_COMMUNITY)
Admission: RE | Admit: 2019-03-12 | Discharge: 2019-03-12 | Disposition: A | Discharge status: Home / Self Care | Payer: Managed Care, Other (non HMO) | Source: Ambulatory Visit | Attending: Cardiology | Admitting: Cardiology

## 2019-03-12 ENCOUNTER — Other Ambulatory Visit: Payer: Self-pay

## 2019-03-12 DIAGNOSIS — I5022 Chronic systolic (congestive) heart failure: Secondary | ICD-10-CM | POA: Insufficient documentation

## 2019-03-12 LAB — LIPID PANEL
Cholesterol: 160 mg/dL (ref 0–200)
HDL: 51 mg/dL (ref >40)
LDL Cholesterol: 92 mg/dL (ref 0–99)
Total CHOL/HDL Ratio: 3.1 RATIO
Triglycerides: 87 mg/dL (ref <150)
VLDL: 17 mg/dL (ref 0–40)

## 2019-03-12 LAB — BASIC METABOLIC PANEL
Anion gap: 10 (ref 5–15)
BUN: 15 mg/dL (ref 6–20)
CO2: 27 mmol/L (ref 22–32)
Calcium: 9.6 mg/dL (ref 8.9–10.3)
Chloride: 99 mmol/L (ref 98–111)
Creatinine, Ser: 1.23 mg/dL — ABNORMAL HIGH (ref 0.44–1.00)
GFR calc Af Amer: 57 mL/min — ABNORMAL LOW (ref >60)
GFR calc non Af Amer: 49 mL/min — ABNORMAL LOW (ref >60)
Glucose, Bld: 158 mg/dL — ABNORMAL HIGH (ref 70–99)
Potassium: 4.9 mmol/L (ref 3.5–5.1)
Sodium: 136 mmol/L (ref 135–145)

## 2019-03-13 ENCOUNTER — Telehealth (HOSPITAL_COMMUNITY): Payer: Self-pay

## 2019-03-13 DIAGNOSIS — I5022 Chronic systolic (congestive) heart failure: Secondary | ICD-10-CM

## 2019-03-13 MED ORDER — ATORVASTATIN CALCIUM 20 MG PO TABS: 20.0000 mg | ORAL_TABLET | Freq: Every day | ORAL | 5 refills | Status: DC

## 2019-03-13 NOTE — Telephone Encounter (Signed)
Spoke with patient, aware of lab results. Pt advised to increase atorvastatin to 20mg  daily. And repeat lab work in 2 months for lfts and lipids.  appt card mailed. Pt verbalized understanding.

## 2019-03-13 NOTE — Telephone Encounter (Signed)
-----   Message from Dalton S McLean, MD sent at 03/13/2019  3:18 PM EDT ----- With diabetes, would aim for LDL < 70.  Increase atorvastatin to 20 mg daily with lipids/LFTs in 2 months. 

## 2019-03-17 ENCOUNTER — Telehealth (HOSPITAL_COMMUNITY): Payer: Self-pay

## 2019-03-17 NOTE — Telephone Encounter (Signed)
Left VM to return call 

## 2019-03-17 NOTE — Telephone Encounter (Signed)
-----   Message from Laurey Morale, MD sent at 03/13/2019  3:18 PM EDT ----- With diabetes, would aim for LDL < 70.  Increase atorvastatin to 20 mg daily with lipids/LFTs in 2 months.

## 2019-03-19 NOTE — Telephone Encounter (Signed)
Pt aware of results and voiced understanding rx sent to pharmacy  Patient requests to call at a later time to schedule lab appt Orders placed

## 2019-03-23 ENCOUNTER — Ambulatory Visit (INDEPENDENT_AMBULATORY_CARE_PROVIDER_SITE_OTHER): Payer: Managed Care, Other (non HMO)

## 2019-03-23 DIAGNOSIS — I5022 Chronic systolic (congestive) heart failure: Secondary | ICD-10-CM

## 2019-03-23 DIAGNOSIS — G4733 Obstructive sleep apnea (adult) (pediatric): Secondary | ICD-10-CM

## 2019-03-24 ENCOUNTER — Other Ambulatory Visit: Payer: Self-pay

## 2019-03-24 NOTE — Progress Notes (Signed)
Mild to moderate OSA.  CPAP would be an option, patient will hear from Dr. Norris Cross office.

## 2019-03-25 ENCOUNTER — Telehealth: Payer: Self-pay | Admitting: *Deleted

## 2019-03-25 NOTE — Telephone Encounter (Signed)
Informed patient of sleep study results and patient understanding was verbalized. Patient understands her sleep study showed they have sleep apnea. We will order Resmed CPAP with heated humidity and mask of choice set on Auto from 5 to 20cm H2O and get a download in 2 weeks.  Followup with TT 10 weeks after she gets her device.

## 2019-03-25 NOTE — Telephone Encounter (Signed)
-----   Message from Quintella Reichert, MD sent at 03/24/2019 10:15 AM EDT ----- Please let patient know that they have sleep apnea.  Please order Resmed CPAP with heated humidity and mask of choice set on Auto from 5 to 20cm H2O and get a download in 2 weeks.  Followup with me 10 weeks after she gets her device.

## 2019-03-26 ENCOUNTER — Telehealth: Payer: Self-pay | Admitting: *Deleted

## 2019-03-26 DIAGNOSIS — G4733 Obstructive sleep apnea (adult) (pediatric): Secondary | ICD-10-CM

## 2019-03-26 NOTE — Telephone Encounter (Signed)
Order placed  Please order PAP through [facility]

## 2019-03-26 NOTE — Telephone Encounter (Signed)
-----   Message from Traci R Turner, MD sent at 03/24/2019 10:15 AM EDT ----- Please let patient know that they have sleep apnea.  Please order Resmed CPAP with heated humidity and mask of choice set on Auto from 5 to 20cm H2O and get a download in 2 weeks.  Followup with me 10 weeks after she gets her device. 

## 2019-03-31 ENCOUNTER — Other Ambulatory Visit (HOSPITAL_COMMUNITY): Payer: Self-pay | Admitting: Cardiology

## 2019-04-16 ENCOUNTER — Other Ambulatory Visit (HOSPITAL_COMMUNITY): Payer: Self-pay | Admitting: Cardiology

## 2019-05-14 ENCOUNTER — Other Ambulatory Visit (HOSPITAL_COMMUNITY): Payer: Managed Care, Other (non HMO)

## 2019-05-19 ENCOUNTER — Other Ambulatory Visit: Payer: Self-pay

## 2019-05-19 ENCOUNTER — Ambulatory Visit (HOSPITAL_COMMUNITY)
Admission: RE | Admit: 2019-05-19 | Discharge: 2019-05-19 | Disposition: A | Discharge status: Home / Self Care | Payer: Managed Care, Other (non HMO) | Source: Ambulatory Visit | Attending: Internal Medicine | Admitting: Internal Medicine

## 2019-05-19 DIAGNOSIS — I5022 Chronic systolic (congestive) heart failure: Secondary | ICD-10-CM

## 2019-05-19 LAB — HEPATIC FUNCTION PANEL
ALT: 36 U/L (ref 0–44)
AST: 26 U/L (ref 15–41)
Albumin: 4 g/dL (ref 3.5–5.0)
Alkaline Phosphatase: 66 U/L (ref 38–126)
Bilirubin, Direct: 0.1 mg/dL (ref 0.0–0.2)
Indirect Bilirubin: 0.6 mg/dL (ref 0.3–0.9)
Total Bilirubin: 0.7 mg/dL (ref 0.3–1.2)
Total Protein: 7.2 g/dL (ref 6.5–8.1)

## 2019-05-19 LAB — LIPID PANEL
Cholesterol: 158 mg/dL (ref 0–200)
HDL: 53 mg/dL (ref >40)
LDL Cholesterol: 83 mg/dL (ref 0–99)
Total CHOL/HDL Ratio: 3 RATIO
Triglycerides: 110 mg/dL (ref <150)
VLDL: 22 mg/dL (ref 0–40)

## 2019-06-06 ENCOUNTER — Other Ambulatory Visit (HOSPITAL_COMMUNITY): Payer: Self-pay | Admitting: Cardiology

## 2019-06-17 NOTE — Telephone Encounter (Signed)
Patient has a 10 week follow up appointment scheduled for 08/04/19. Patient understands she needs to keep this appointment for insurance compliance. Patient was grateful for the call and thanked me.

## 2019-06-19 ENCOUNTER — Telehealth: Payer: Self-pay | Admitting: *Deleted

## 2019-06-19 NOTE — Telephone Encounter (Signed)
Informed patient of compliance results and verbalized understanding was indicated. Patient is aware and agreeable to AHI being within range at 1.3. Patient is aware and agreeable to improving in compliance with machine usage Patient states she takes her cpap off during her sleep. She says she has woke up only one time with it on. She says she is still trying to get use to wearing it. She says she is really trying.

## 2019-06-19 NOTE — Telephone Encounter (Signed)
-----   Message from Sueanne Margarita, MD sent at 06/17/2019  7:49 PM EDT ----- Good AHI on PAP but needs to improve compliance.  Please find out why she is not using it much

## 2019-07-03 ENCOUNTER — Other Ambulatory Visit (HOSPITAL_COMMUNITY): Payer: Self-pay | Admitting: Cardiology

## 2019-07-08 ENCOUNTER — Other Ambulatory Visit: Payer: Self-pay

## 2019-07-08 ENCOUNTER — Encounter (HOSPITAL_COMMUNITY): Payer: Self-pay | Admitting: Cardiology

## 2019-07-08 ENCOUNTER — Ambulatory Visit (HOSPITAL_COMMUNITY)
Admission: RE | Admit: 2019-07-08 | Discharge: 2019-07-08 | Disposition: A | Discharge status: Home / Self Care | Payer: Managed Care, Other (non HMO) | Source: Ambulatory Visit | Attending: Cardiology | Admitting: Cardiology

## 2019-07-08 VITALS — BP 122/68 | HR 61 | Wt 183.8 lb

## 2019-07-08 DIAGNOSIS — Z8249 Family history of ischemic heart disease and other diseases of the circulatory system: Secondary | ICD-10-CM | POA: Insufficient documentation

## 2019-07-08 DIAGNOSIS — Z881 Allergy status to other antibiotic agents status: Secondary | ICD-10-CM | POA: Diagnosis not present

## 2019-07-08 DIAGNOSIS — E039 Hypothyroidism, unspecified: Secondary | ICD-10-CM | POA: Insufficient documentation

## 2019-07-08 DIAGNOSIS — E119 Type 2 diabetes mellitus without complications: Secondary | ICD-10-CM | POA: Diagnosis not present

## 2019-07-08 DIAGNOSIS — I428 Other cardiomyopathies: Secondary | ICD-10-CM | POA: Insufficient documentation

## 2019-07-08 DIAGNOSIS — M797 Fibromyalgia: Secondary | ICD-10-CM | POA: Insufficient documentation

## 2019-07-08 DIAGNOSIS — Z794 Long term (current) use of insulin: Secondary | ICD-10-CM | POA: Diagnosis not present

## 2019-07-08 DIAGNOSIS — G4733 Obstructive sleep apnea (adult) (pediatric): Secondary | ICD-10-CM | POA: Insufficient documentation

## 2019-07-08 DIAGNOSIS — Z79899 Other long term (current) drug therapy: Secondary | ICD-10-CM | POA: Insufficient documentation

## 2019-07-08 DIAGNOSIS — Z833 Family history of diabetes mellitus: Secondary | ICD-10-CM | POA: Diagnosis not present

## 2019-07-08 DIAGNOSIS — F329 Major depressive disorder, single episode, unspecified: Secondary | ICD-10-CM | POA: Insufficient documentation

## 2019-07-08 DIAGNOSIS — E785 Hyperlipidemia, unspecified: Secondary | ICD-10-CM | POA: Diagnosis not present

## 2019-07-08 DIAGNOSIS — Z79890 Hormone replacement therapy (postmenopausal): Secondary | ICD-10-CM | POA: Insufficient documentation

## 2019-07-08 DIAGNOSIS — Z7982 Long term (current) use of aspirin: Secondary | ICD-10-CM | POA: Insufficient documentation

## 2019-07-08 DIAGNOSIS — I110 Hypertensive heart disease with heart failure: Secondary | ICD-10-CM | POA: Insufficient documentation

## 2019-07-08 DIAGNOSIS — I5022 Chronic systolic (congestive) heart failure: Secondary | ICD-10-CM | POA: Diagnosis not present

## 2019-07-08 LAB — BASIC METABOLIC PANEL
Anion gap: 11 (ref 5–15)
BUN: 13 mg/dL (ref 6–20)
CO2: 28 mmol/L (ref 22–32)
Calcium: 9.6 mg/dL (ref 8.9–10.3)
Chloride: 99 mmol/L (ref 98–111)
Creatinine, Ser: 1.21 mg/dL — ABNORMAL HIGH (ref 0.44–1.00)
GFR calc Af Amer: 58 mL/min — ABNORMAL LOW (ref >60)
GFR calc non Af Amer: 50 mL/min — ABNORMAL LOW (ref >60)
Glucose, Bld: 104 mg/dL — ABNORMAL HIGH (ref 70–99)
Potassium: 4.6 mmol/L (ref 3.5–5.1)
Sodium: 138 mmol/L (ref 135–145)

## 2019-07-08 NOTE — Patient Instructions (Signed)
No medication changes  Labs today We will only contact you if something comes back abnormal or we need to make some changes. Otherwise no news is good news!  Your physician recommends that you schedule a follow-up appointment in: 4 months with Dr. Aundra Dubin and an ECHO  At the [facility], you and your health needs are our priority. As part of our continuing mission to provide you with exceptional heart care, we have created designated Provider Care Teams. These Care Teams include your primary Cardiologist (physician) and Advanced Practice Providers (APPs- Physician Assistants and Nurse Practitioners) who all work together to provide you with the care you need, when you need it.   You may see any of the following providers on your designated Care Team at your next follow up: Marland Kitchen Dr Glori Bickers . Dr Loralie Champagne . Darrick Grinder, NP   Please be sure to bring in all your medications bottles to every appointment.

## 2019-07-09 NOTE — Progress Notes (Signed)
Date:  07/09/2019   ID:  Robin Owen, DOB [birth year], MRN [id number]   Provider location: [facility] Type of Visit: Established patient  PCP:  Hali Marry, MD  Cardiologist:  Dr. Shirlee Latch  Chief Complaint: Shortness of breath   History of Present Illness: Robin Owen is a 58 y.o. female who has a past medical history of HTN, HLD, DMII, hypothyroidism and fibromyalgia (on disability), and CHF.   Admitted 11/16 through 09/17/15 with increased dyspnea. CXR with pulmonary edema. This prompted and ECHO that showed reduced EF 20%. RHC/LHC showed normal coronaries and reduced cardiac index. Cardiac MRI showed some mid-wall late gadolinium enhancement in the septum.  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 She has been lisinopril for some time. Started on coreg and lasix.  Discharge weight was 163 pounds.   She developed AKI and hyperkalemia in the setting of high dose Ibuprofen.  I asked her to stop this and started her on tramadol as needed instead.    Echo in 11/18 showed increase in EF to 50-55%.      Echo 10/24/18 LVEF 45-50%, Grade 1 DD, Mild MR, PA peak pressure 24 mm Hg  She returns today for followup of CHF.  She is now on CPAP for OSA.  She is short of breath walking up stairs but has no problems walking around on flat ground.  No chest pain.  No orthopnea/PND.  No lightheadedness.    Test/Procedures  09/15/2015 ECHO EF 20% MV mild regurgitation TV mild regurgitation  09/19/2015 CMRI 1. Severely dilated left ventricle with normal wall thickness and severe systolic dysfunction (LVEF 20-25%) with global hypokinesis and akinesis of the entire septal wall and diffuse mid wall late gadolinium enhancement. 2. Moderate RV dilatation with mild moderate systolic dysfunction. 3. Moderate left and right atrial dilatation. 4. Mild to moderate mitral and mild tricuspid regurgitation. 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  RHC/LHC 09/16/2015  RA 7 PCWP 29 CO 3.11 CI 1.7  Normal Cors  Echo (5/17) with EF 35-40%.   Echo (12/17) with EF 35-40%, mild LV dilation.   CPX (12/17) with RER 1.13, peak VO2 20.8, VE/VCO2 slope 30.  Echo (11/18): EF 50-55%.   Labs 09/15/2015: BNP 2354 Labs 09/16/2015: TSH 1.99 Labs 09/17/2015: K 3.8 Creatinine 0.91 Labs 09/29/2015: K 4.5 Creatinine 1.09 Labs 10/05/2015: K 5.3 Creatinine 1.21, LDL 74, HCT 37.2 Labs 1/17: SPEP negative Labs 2/17: K 3.9, creatinine 1.34, digoxin 0.7, hemoglobin 12.5 Labs 3/17: K 3.9, creatinine 1.25, hemoglobin 10 Labs 4/17: K 4.2, creatinine 1.15 Labs 5/17: K 3.8, creatinine 1.11, HCT 31.7 Labs 6/17: K 5.1, creatinine 0.98, BNP 45 Labs 12/17: K 5, creatinine 1.17, BNP 35 Labs 3/18: K 3.9, creatinine 1.07 Labs 8/18: K 4.3, creatinine 0.94 Labs 2/19: K 5.7, creatinine 1.39, Na 125, hgb 12.6, LDL 102 Labs 5/19: K 4.7, creatinine 1.12 Labs 12/19: K 4.1, creatinine 1.09 Labs 5/20: creatinine 1.13 Labs 7/20: LDL 83, HDL 53, LFTs normal  Past Medical History:  Diagnosis Date  . Anemia   . CHF (congestive heart failure) (HCC)   . Depression   . Diabetes mellitus without complication (HCC)   . Fibromyalgia   . Hyperlipidemia   . Hypertension   . Hypothyroidism      Current Outpatient Medications  Medication Sig Dispense Refill  . acetaminophen (TYLENOL) 325 MG tablet Take 650 mg by mouth every 6 (six) hours as needed for mild pain.    Marland Kitchen  aspirin 81 MG tablet Take 81 mg by mouth daily.    Marland Kitchen atorvastatin (LIPITOR) 40 MG tablet Take 40 mg by mouth daily.    . carvedilol (COREG) 25 MG tablet TAKE 1 TABLET BY MOUTH TWICE A DAY WITH MEALS 180 tablet 1  . Cholecalciferol (VITAMIN D-1000 MAX ST) 1000 units tablet Take 1,000 Units by mouth daily.    Steffanie Dunn 5 MG TABS tablet TAKE 1 TABLET (5 MG TOTAL) BY MOUTH 2 (TWO) TIMES DAILY WITH A MEAL. 60 tablet 11  . cyclobenzaprine (FLEXERIL) 10  MG tablet Take 10 mg by mouth 2 (two) times daily as needed for muscle spasms (pt takes qhs and PRN).     Marland Kitchen ENTRESTO 49-51 MG TAKE 1 TABLET BY MOUTH TWICE A DAY 60 tablet 3  . FLUoxetine (PROZAC) 20 MG tablet Take 20 mg by mouth daily.    . furosemide (LASIX) 40 MG tablet TAKE 1 TABLET BY MOUTH TWICE A DAY 180 tablet 3  . gabapentin (NEURONTIN) 600 MG tablet Take 300-600 mg by mouth 3 (three) times daily as needed (for fibromyalgia). Take 1/2 tablet (300mg ) by mouth in the morning, and afternonn, and 1 tablet (600mg ) at bedtime    . Insulin Glargine, 2 Unit Dial, (TOUJEO MAX SOLOSTAR) 300 UNIT/ML SOPN Inject 28 Units into the skin daily.    . Iron-FA-B Cmp-C-Biot-Probiotic (FUSION PLUS PO) Take 1 capsule by mouth daily.    Marland Kitchen levothyroxine (SYNTHROID, LEVOTHROID) 88 MCG tablet Take 88 mcg by mouth daily before breakfast.    . Liraglutide (VICTOZA) 18 MG/3ML SOPN Inject 1.8 mg into the skin daily.    . metFORMIN (GLUCOPHAGE) 1000 MG tablet Take 1 tablet (1,000 mg total) by mouth 2 (two) times daily with a meal.    . Methylcellulose, Laxative, (MIRAFIBER) 500 MG TABS Take 500 mg by mouth daily.    . Multiple Vitamin (MULTIVITAMIN WITH MINERALS) TABS tablet Take 1 tablet by mouth daily.    Marland Kitchen NOVOTWIST 32G X 5 MM MISC Use as directed with Victozia.  2  . ONETOUCH VERIO test strip Check blood glucose once daily  2  . spironolactone (ALDACTONE) 25 MG tablet TAKE 1 TABLET BY MOUTH EVERY DAY 90 tablet 2  . traMADol (ULTRAM) 50 MG tablet Take 50 mg by mouth every 6 (six) hours as needed for moderate pain (pt takes qhs and PRN).      No current facility-administered medications for this encounter.     Allergies:   Macrobid [nitrofurantoin]   Social History:  The patient  reports that she has never smoked. She has never used smokeless tobacco. She reports that she does not drink alcohol or use drugs.   Family History:  The patient's family history includes Atrial fibrillation in her maternal grandmother  and mother; Diabetes Mellitus II in her brother and father; Hypertension in her brother, father, maternal grandmother, and mother; Kidney disease in her father; Pulmonary fibrosis in her mother; Stroke in her maternal grandmother.   ROS:  Please see the history of present illness.   All other systems are personally reviewed and negative.   Exam:   BP 122/68   Pulse 61   Wt 83.4 kg (183 lb 12.8 oz)   SpO2 98%   BMI 30.59 kg/m  General: NAD Neck: No JVD, no thyromegaly or thyroid nodule.  Lungs: Clear to auscultation bilaterally with normal respiratory effort. CV: Nondisplaced PMI.  Heart regular S1/S2, no S3/S4, no murmur.  No peripheral edema.  No carotid bruit.  Normal pedal pulses.  Abdomen: Soft, nontender, no hepatosplenomegaly, no distention.  Skin: Intact without lesions or rashes.  Neurologic: Alert and oriented x 3.  Psych: Normal affect. Extremities: No clubbing or cyanosis.  HEENT: Normal.    Recent Labs: 10/09/2018: B Natriuretic Peptide 89.2 05/19/2019: ALT 36 07/08/2019: BUN 13; Creatinine, Ser 1.21; Potassium 4.6; Sodium 138  Personally reviewed   Wt Readings from Last 3 Encounters:  07/08/19 83.4 kg (183 lb 12.8 oz)  10/30/18 82.6 kg (182 lb 3.2 oz)  10/09/18 83.6 kg (184 lb 6.4 oz)      ASSESSMENT AND PLAN:  1. Chronic systolic CHF: 09/15/2015 ECHO EF 20%. Had cMRI EF 20-25%, RV mod dilated.  cMRI (11/16) showed an area of mid-wall septal late gadolinium enhancement. NICM perhaps related to HTN versus viral myocarditis. SPEP negative.  09/16/2015 RHC/LHC coronaries ok, low cardiac index 1.7.  Repeat echo in 5/17 and again in 12/17 showed some improvement, EF 35-40%.  CPX in 12/17 showed low normal peak VO2 with no clear cardiopulmonary limitation. Echo in 11/18 showed EF up to 50-55%, echo in 12/19 showed EF 45-50%.   NYHA class II symptoms chronically. She is not volume overloaded on exam.    - Continue lasix 40 mg BID.  BMET today.  - Continue spironolactone 25  mg daily.   - Continue Coreg 25 mg BID - Continue Entresto 49/51 mg BID.    - Continue ivabradine 5 mg BID - EF out of range for ICD.   - I will arrange for repeat echo in 4 months at followup.  2. Fibromyalgia: Disabled since 1996. No change.  3. Diabetes: Off empagliflozin due to recurrent yeast infection.  4. OSA: Now using CPAP.   Signed, Marca Anconaalton , MD  07/09/2019  [facility] [place] [address] [facility] [place] [address] [phone] (office) [phone] (fax)

## 2019-07-31 ENCOUNTER — Telehealth: Payer: Self-pay | Admitting: *Deleted

## 2019-07-31 NOTE — Telephone Encounter (Signed)
Virtual Visit Pre-Appointment Phone Call  "(Name), I am calling you today to discuss your upcoming appointment. We are currently trying to limit exposure to the virus that causes COVID-19 by seeing patients at home rather than in the office."  1. "What is the BEST phone number to call the day of the visit?" - include this in appointment notes  2. "Do you have or have access to (through a family member/friend) a smartphone with video capability that we can use for your visit?" a. If yes - list this number in appt notes as "cell" (if different from BEST phone #) and list the appointment type as a VIDEO visit in appointment notes b. If no - list the appointment type as a PHONE visit in appointment notes  3. Confirm consent - "In the setting of the current Covid19 crisis, you are scheduled for a (phone or video) visit with your provider on (date) at (time).  Just as we do with many in-office visits, in order for you to participate in this visit, we must obtain consent.  If you'd like, I can send this to your mychart (if signed up) or email for you to review.  Otherwise, I can obtain your verbal consent now.  All virtual visits are billed to your insurance company just like a normal visit would be.  By agreeing to a virtual visit, we'd like you to understand that the technology does not allow for your provider to perform an examination, and thus may limit your provider's ability to fully assess your condition. If your provider identifies any concerns that need to be evaluated in person, we will make arrangements to do so.  Finally, though the technology is pretty good, we cannot assure that it will always work on either your or our end, and in the setting of a video visit, we may have to convert it to a phone-only visit.  In either situation, we cannot ensure that we have a secure connection.  Are you willing to proceed?" STAFF: Did the patient verbally acknowledge consent to telehealth visit? Document  YES/NO here: YES  4. Advise patient to be prepared - "Two hours prior to your appointment, go ahead and check your blood pressure, pulse, oxygen saturation, and your weight (if you have the equipment to check those) and write them all down. When your visit starts, your provider will ask you for this information. If you have an Apple Watch or Kardia device, please plan to have heart rate information ready on the day of your appointment. Please have a pen and paper handy nearby the day of the visit as well."  5. Give patient instructions for MyChart download to smartphone OR [email or web address] as below if video visit (depending on what platform provider is using)  6. Inform patient they will receive a phone call 15 minutes prior to their appointment time (may be from unknown caller ID) so they should be prepared to answer    TELEPHONE CALL NOTE  Robin Owen has been deemed a candidate for a follow-up tele-health visit to limit community exposure during the Covid-19 pandemic. I spoke with the patient via phone to ensure availability of phone/video source, confirm preferred email & phone number, and discuss instructions and expectations.  I reminded Robin Owen to be prepared with any vital sign and/or heart rhythm information that could potentially be obtained via home monitoring, at the time of her visit. I reminded Robin Owen to expect a phone call prior to  her visit.  Robin Owen, CMA 07/31/2019 12:01 PM   INSTRUCTIONS FOR DOWNLOADING THE MYCHART APP TO SMARTPHONE  - The patient must first make sure to have activated MyChart and know their login information - If Apple, go to [employer] and type in MyChart in the search bar and download the app. If Android, ask patient to go to [employer] and type in [place] in the search bar and download the app. The app is free but as with any other app downloads, their phone may require them to verify saved payment information or  Apple/Android password.  - The patient will need to then log into the app with their MyChart username and password, and select [facility] as their healthcare provider to link the account. When it is time for your visit, go to the MyChart app, find appointments, and click Begin Video Visit. Be sure to Select Allow for your device to access the Microphone and Camera for your visit. You will then be connected, and your provider will be with you shortly.  **If they have any issues connecting, or need assistance please contact MyChart service desk [phone])**  **If using a computer, in order to ensure the best quality for their visit they will need to use either of the following Internet Browsers: [employer] or Google Chrome**  IF USING DOXIMITY or DOXY.ME - The patient will receive a link just prior to their visit by text.     FULL LENGTH CONSENT FOR TELE-HEALTH VISIT   I hereby voluntarily request, consent and authorize [facility] and its employed or contracted physicians, physician assistants, nurse practitioners or other licensed health care professionals (the Practitioner), to provide me with telemedicine health care services (the "Services") as deemed necessary by the treating Practitioner. I acknowledge and consent to receive the Services by the Practitioner via telemedicine. I understand that the telemedicine visit will involve communicating with the Practitioner through live audiovisual communication technology and the disclosure of certain medical information by electronic transmission. I acknowledge that I have been given the opportunity to request an in-person assessment or other available alternative prior to the telemedicine visit and am voluntarily participating in the telemedicine visit.  I understand that I have the right to withhold or withdraw my consent to the use of telemedicine in the course of my care at any time, without affecting my right to future care  or treatment, and that the Practitioner or I may terminate the telemedicine visit at any time. I understand that I have the right to inspect all information obtained and/or recorded in the course of the telemedicine visit and may receive copies of available information for a reasonable fee.  I understand that some of the potential risks of receiving the Services via telemedicine include:  Marland Kitchen Delay or interruption in medical evaluation due to technological equipment failure or disruption; . Information transmitted may not be sufficient (e.g. poor resolution of images) to allow for appropriate medical decision making by the Practitioner; and/or  . In rare instances, security protocols could fail, causing a breach of personal health information.  Furthermore, I acknowledge that it is my responsibility to provide information about my medical history, conditions and care that is complete and accurate to the best of my ability. I acknowledge that Practitioner's advice, recommendations, and/or decision may be based on factors not within their control, such as incomplete or inaccurate data provided by me or distortions of diagnostic images or specimens that may result from electronic transmissions.  I understand that the practice of medicine is not an exact science and that Practitioner makes no warranties or guarantees regarding treatment outcomes. I acknowledge that I will receive a copy of this consent concurrently upon execution via email to the email address I last provided but may also request a printed copy by calling the office of [facility].    I understand that my insurance will be billed for this visit.   I have read or had this consent read to me. . I understand the contents of this consent, which adequately explains the benefits and risks of the Services being provided via telemedicine.  . I have been provided ample opportunity to ask questions regarding this consent and the Services and have had  my questions answered to my satisfaction. . I give my informed consent for the services to be provided through the use of telemedicine in my medical care  By participating in this telemedicine visit I agree to the above.

## 2019-08-04 ENCOUNTER — Telehealth (INDEPENDENT_AMBULATORY_CARE_PROVIDER_SITE_OTHER): Payer: Managed Care, Other (non HMO) | Admitting: Cardiology

## 2019-08-04 ENCOUNTER — Other Ambulatory Visit: Payer: Self-pay

## 2019-08-04 ENCOUNTER — Telehealth: Payer: Self-pay | Admitting: *Deleted

## 2019-08-04 ENCOUNTER — Encounter: Payer: Self-pay | Admitting: Cardiology

## 2019-08-04 ENCOUNTER — Other Ambulatory Visit (HOSPITAL_COMMUNITY): Payer: Self-pay | Admitting: Cardiology

## 2019-08-04 VITALS — HR 70 | Ht 66.0 in | Wt 180.0 lb

## 2019-08-04 DIAGNOSIS — E669 Obesity, unspecified: Secondary | ICD-10-CM | POA: Diagnosis not present

## 2019-08-04 DIAGNOSIS — I10 Essential (primary) hypertension: Secondary | ICD-10-CM | POA: Diagnosis not present

## 2019-08-04 DIAGNOSIS — G4733 Obstructive sleep apnea (adult) (pediatric): Secondary | ICD-10-CM

## 2019-08-04 NOTE — Telephone Encounter (Signed)
-----   Message from Sueanne Margarita, MD sent at 08/04/2019  9:51 AM EDT ----- Order Respironics Dreamware under the nose FFM and followup virtual with me in 2 months

## 2019-08-04 NOTE — Patient Instructions (Signed)
Medication Instructions:   If you need a refill on your cardiac medications before your next appointment, please call your pharmacy.   Lab work:  If you have labs (blood work) drawn today and your tests are completely normal, you will receive your results only by: Marland Kitchen MyChart Message (if you have MyChart) OR . A paper copy in the mail If you have any lab test that is abnormal or we need to change your treatment, we will call you to review the results.  Testing/Procedures: None ordered today.  Follow-Up: At [facility], you and your health needs are our priority.  As part of our continuing mission to provide you with exceptional heart care, we have created designated Provider Care Teams.  These Care Teams include your primary Cardiologist (physician) and Advanced Practice Providers (APPs -  Physician Assistants and Nurse Practitioners) who all work together to provide you with the care you need, when you need it. You will need a follow up appointment in 2 months for virtual visit.  You may see Dr. Radford Pax or one of the following Advanced Practice Providers on your designated Care Team:   [place], PA-C Melina Copa, PA-C . Ermalinda Barrios, PA-C

## 2019-08-04 NOTE — Progress Notes (Signed)
Virtual Visit via Video Note   This visit type was conducted due to national recommendations for restrictions regarding the COVID-19 Pandemic (e.g. social distancing) in an effort to limit this patient's exposure and mitigate transmission in our community.  Due to her co-morbid illnesses, this patient is at least at moderate risk for complications without adequate follow up.  This format is felt to be most appropriate for this patient at this time.  All issues noted in this document were discussed and addressed.  A limited physical exam was performed with this format.  Please refer to the patient's chart for her consent to telehealth for [facility].   Evaluation Performed:  Follow-up visit  This visit type was conducted due to national recommendations for restrictions regarding the COVID-19 Pandemic (e.g. social distancing).  This format is felt to be most appropriate for this patient at this time.  All issues noted in this document were discussed and addressed.  No physical exam was performed (except for noted visual exam findings with Video Visits).  Please refer to the patient's chart (MyChart message for video visits and phone note for telephone visits) for the patient's consent to telehealth for [facility].  Date:  08/04/2019   ID:  Robin Owen, DOB 1961/04/24, MRN [id number]  Patient Location:  Home  Provider location:   [place]  PCP:  Hali Marry, MD  Sleep Medicine: Armanda Magic, MD Electrophysiologist:  None   Chief Complaint:  OSA  History of Present Illness:    Robin Owen is a 58 y.o. female who presents via audio/video conferencing for a telehealth visit today.    This is a 58yo female with a hx of CHF, HTN and HLD who was referred for sleep study.  Dr. Shirlee Latch ordered the sleep study due to excessive daytime sleepiness, snoring and hx of CHF. She is doing well with her CPAP device and thinks that she has gotten used to it.  She tolerates the mask  and feels the pressure is adequate.  Since going on CPAP she feels rested in the am and has no significant daytime sleepiness.  She denies any significant mouth or nasal dryness or nasal congestion.  She does not think that he snores.  She underwent home sleep study showing mild OSA with an AHI of 14.1/hr and is now on auto CPAP.    She is doing well with her CPAP device and thinks that she has gotten used to it.  She tolerates the nasal pillow mask but is having a lot of problems unknowingly taking her mask off while asleep and finding it off her face when she wakes up in the am.  This does not happen every night but fairly frequently.   She feels the pressure is adequate.  Since going on CPAP she feels rested in the am and has no significant daytime sleepiness if she has been able to keep it on her face all night.  She denies any significant mouth or nasal dryness or nasal congestion.  She does not think that he snores.    The patient does not have symptoms concerning for COVID-19 infection (fever, chills, cough, or new shortness of breath).   Prior CV studies:   The following studies were reviewed today:  PAP compliance download  Past Medical History:  Diagnosis Date   Anemia    CHF (congestive heart failure) (HCC)    Depression    Diabetes mellitus without complication (HCC)    Fibromyalgia  Hyperlipidemia    Hypertension    Hypothyroidism    Past Surgical History:  Procedure Laterality Date   CARDIAC CATHETERIZATION N/A 09/16/2015   Procedure: Right/Left Heart Cath and Coronary Angiography;  Surgeon: Peter M Martinique, MD;  Location: [place];  Service: Cardiovascular;  Laterality: N/A;   ENDOMETRIAL ABLATION  2012     Current Meds  Medication Sig   acetaminophen (TYLENOL) 325 MG tablet Take 650 mg by mouth every 6 (six) hours as needed for mild pain.   aspirin 81 MG tablet Take 81 mg by mouth daily.   atorvastatin (LIPITOR) 40 MG tablet Take 40 mg by mouth  daily.   carvedilol (COREG) 25 MG tablet TAKE 1 TABLET BY MOUTH TWICE A DAY WITH MEALS   Cholecalciferol (VITAMIN D-1000 MAX ST) 1000 units tablet Take 1,000 Units by mouth daily.   CORLANOR 5 MG TABS tablet TAKE 1 TABLET (5 MG TOTAL) BY MOUTH 2 (TWO) TIMES DAILY WITH A MEAL.   cyclobenzaprine (FLEXERIL) 10 MG tablet Take 10 mg by mouth 2 (two) times daily as needed for muscle spasms (pt takes qhs and PRN).    ENTRESTO 49-51 MG TAKE 1 TABLET BY MOUTH TWICE A DAY   FLUoxetine (PROZAC) 20 MG tablet Take 20 mg by mouth daily.   furosemide (LASIX) 40 MG tablet TAKE 1 TABLET BY MOUTH TWICE A DAY   gabapentin (NEURONTIN) 600 MG tablet Take 300-600 mg by mouth 3 (three) times daily as needed (for fibromyalgia). Take 1/2 tablet (300mg ) by mouth in the morning, and afternonn, and 1 tablet (600mg ) at bedtime   insulin degludec (TRESIBA) 100 UNIT/ML SOPN FlexTouch Pen Inject 30 Units into the skin at bedtime.   Iron-FA-B Cmp-C-Biot-Probiotic (FUSION PLUS PO) Take 1 capsule by mouth daily.   levothyroxine (SYNTHROID, LEVOTHROID) 88 MCG tablet Take 88 mcg by mouth daily before breakfast.   Liraglutide (VICTOZA) 18 MG/3ML SOPN Inject 1.8 mg into the skin daily.   metFORMIN (GLUCOPHAGE) 1000 MG tablet Take 1 tablet (1,000 mg total) by mouth 2 (two) times daily with a meal.   Methylcellulose, Laxative, (MIRAFIBER) 500 MG TABS Take 500 mg by mouth daily as needed.    Multiple Vitamin (MULTIVITAMIN WITH MINERALS) TABS tablet Take 1 tablet by mouth daily.   NOVOTWIST 32G X 5 MM MISC Use as directed with Victozia.   ONETOUCH VERIO test strip Check blood glucose once daily   spironolactone (ALDACTONE) 25 MG tablet TAKE 1 TABLET BY MOUTH EVERY DAY   traMADol (ULTRAM) 50 MG tablet Take 50 mg by mouth every 6 (six) hours as needed for moderate pain (pt takes qhs and PRN).      Allergies:   Macrobid [nitrofurantoin]   Social History   Tobacco Use   Smoking status: Never Smoker   Smokeless  tobacco: Never Used  Substance Use Topics   Alcohol use: No   Drug use: No     Family Hx: The patient's family history includes Atrial fibrillation in her maternal grandmother and mother; Diabetes Mellitus II in her brother and father; Hypertension in her brother, father, maternal grandmother, and mother; Kidney disease in her father; Pulmonary fibrosis in her mother; Stroke in her maternal grandmother.  ROS:   Please see the history of present illness.     All other systems reviewed and are negative.   Labs/Other Tests and Data Reviewed:    Recent Labs: 10/09/2018: B Natriuretic Peptide 89.2 05/19/2019: ALT 36 07/08/2019: BUN 13; Creatinine, Ser 1.21; Potassium 4.6; Sodium  138   Recent Lipid Panel Lab Results  Component Value Date/Time   CHOL 158 05/19/2019 01:29 PM   TRIG 110 05/19/2019 01:29 PM   HDL 53 05/19/2019 01:29 PM   CHOLHDL 3.0 05/19/2019 01:29 PM   LDLCALC 83 05/19/2019 01:29 PM    Wt Readings from Last 3 Encounters:  08/04/19 180 lb (81.6 kg)  07/08/19 183 lb 12.8 oz (83.4 kg)  10/30/18 182 lb 3.2 oz (82.6 kg)     Objective:    Vital Signs:  Pulse 70    Ht 5\' 6"  (1.676 m)    Wt 180 lb (81.6 kg)    BMI 29.05 kg/m    CONSTITUTIONAL:  Well nourished, well developed female in no acute distress.  EYES: anicteric MOUTH: oral mucosa is pink RESPIRATORY: Normal respiratory effort, symmetric expansion CARDIOVASCULAR: No peripheral edema SKIN: No rash, lesions or ulcers MUSCULOSKELETAL: no digital cyanosis NEURO: Cranial Nerves II-XII grossly intact, moves all extremities PSYCH: Intact judgement and insight.  A&O x 3, Mood/affect appropriate   ASSESSMENT & PLAN:    1.  OSA - The pathophysiology of obstructive sleep apnea , it's cardiovascular consequences & modes of treatment including CPAP were discused with the patient in detail & they evidenced understanding.  The patient is tolerating PAP therapy well without any problems. The PAP download was reviewed  today and showed an AHI of 3/hr on auto PAP with 57% compliance in using more than 4 hours nightly.  The patient has been using and benefiting from PAP use and will continue to benefit from therapy. She has had problems with compliance due to taking the mask off in her sleep and she is unaware that she is doing it.  I have recommended trying a Respironics Dreamware under the nose FFM as I think this one may be more difficult to pull off in her sleep.  I will see her back in 2 months to see how she is doing.  She uses the device every night but unfortunately she unknowingly pulls it off while asleep and that is what is affecting her compliance.  2.  HTN -continue Carvedilol 25mg  BID, spiro 25mg  daily and Entresto 49-51mg  BID.   COVID-19 Education: The signs and symptoms of COVID-19 were discussed with the patient and how to seek care for testing (follow up with PCP or arrange E-visit).  The importance of social distancing was discussed today.  Patient Risk:   After full review of this patient's clinical status, I feel that they are at least moderate risk at this time.  Time:   Today, I have spent 20 minutes directly with the patient on telemedicine discussing medical problems including OSA.  We also reviewed the symptoms of COVID 19 and the ways to protect against contracting the virus with telehealth technology.  I spent an additional 5 minutes reviewing patient's chart including PAP compliance.  Medication Adjustments/Labs and Tests Ordered: Current medicines are reviewed at length with the patient today.  Concerns regarding medicines are outlined above.  Tests Ordered: No orders of the defined types were placed in this encounter.  Medication Changes: No orders of the defined types were placed in this encounter.   Disposition:  Follow up in 2 month(s) virtual   Signed, Armanda Magic, MD  08/04/2019 10:00 AM    [facility]

## 2019-08-04 NOTE — Telephone Encounter (Signed)
Order placed to [employer] to Order Respironics Dreamware under the nose FFM and followup virtual with me in 2 months  Order faxed by internal fax on 08/04/19 to [phone].

## 2019-08-13 ENCOUNTER — Other Ambulatory Visit: Payer: Self-pay | Admitting: Internal Medicine

## 2019-08-13 ENCOUNTER — Ambulatory Visit
Admission: RE | Admit: 2019-08-13 | Discharge: 2019-08-13 | Disposition: A | Discharge status: Home / Self Care | Payer: Managed Care, Other (non HMO) | Source: Ambulatory Visit | Attending: Internal Medicine | Admitting: Internal Medicine

## 2019-08-13 DIAGNOSIS — K5900 Constipation, unspecified: Secondary | ICD-10-CM

## 2019-08-20 ENCOUNTER — Other Ambulatory Visit (HOSPITAL_COMMUNITY): Payer: Self-pay | Admitting: Cardiology

## 2019-08-20 DIAGNOSIS — I5022 Chronic systolic (congestive) heart failure: Secondary | ICD-10-CM

## 2019-10-12 ENCOUNTER — Telehealth (INDEPENDENT_AMBULATORY_CARE_PROVIDER_SITE_OTHER): Payer: Managed Care, Other (non HMO) | Admitting: Cardiology

## 2019-10-12 ENCOUNTER — Telehealth: Payer: Self-pay | Admitting: *Deleted

## 2019-10-12 ENCOUNTER — Other Ambulatory Visit: Payer: Self-pay

## 2019-10-12 ENCOUNTER — Encounter: Payer: Self-pay | Admitting: Cardiology

## 2019-10-12 VITALS — HR 64 | Ht 66.0 in | Wt 180.0 lb

## 2019-10-12 DIAGNOSIS — E669 Obesity, unspecified: Secondary | ICD-10-CM | POA: Diagnosis not present

## 2019-10-12 DIAGNOSIS — I10 Essential (primary) hypertension: Secondary | ICD-10-CM

## 2019-10-12 DIAGNOSIS — G4733 Obstructive sleep apnea (adult) (pediatric): Secondary | ICD-10-CM

## 2019-10-12 NOTE — Patient Instructions (Signed)

## 2019-10-12 NOTE — Telephone Encounter (Signed)
-----   Message from Sueanne Margarita, MD sent at 10/12/2019 10:06 AM EST ----- Robin Owen  I ordered a new Respironics Dreamwear unfer the nose FFM and she never got it please check into this and find out what is going on.  Please followup with me in 1 month virtual

## 2019-10-12 NOTE — Progress Notes (Signed)
Virtual Visit via Telephone Note   This visit type was conducted due to national recommendations for restrictions regarding the COVID-19 Pandemic (e.g. social distancing) in an effort to limit this patient's exposure and mitigate transmission in our community.  Due to her co-morbid illnesses, this patient is at least at moderate risk for complications without adequate follow up.  This format is felt to be most appropriate for this patient at this time.  All issues noted in this document were discussed and addressed.  A limited physical exam was performed with this format.  Please refer to the patient's chart for her consent to telehealth for [facility].   Evaluation Performed:  Follow-up visit  This visit type was conducted due to national recommendations for restrictions regarding the COVID-19 Pandemic (e.g. social distancing).  This format is felt to be most appropriate for this patient at this time.  All issues noted in this document were discussed and addressed.  No physical exam was performed (except for noted visual exam findings with Video Visits).  Please refer to the patient's chart (MyChart message for video visits and phone note for telephone visits) for the patient's consent to telehealth for [facility].  Date:  10/12/2019   ID:  Robin Owen, DOB 08/17/1961, MRN [id number]  Patient Location:  Home  Provider location:   [place]  PCP:  Hali Marry, MD  Sleep Medicine:  Armanda Magic, MD Electrophysiologist:  None   Chief Complaint:  OSA  History of Present Illness:    Robin Owen is a 58 y.o. female who presents via audio/video conferencing for a telehealth visit today.    This is a 58yo female with a hx of CHF, HTN and HLD who was referred for sleep study.  Dr. Shirlee Latch ordered the sleep study due to excessive daytime sleepiness, snoring and hx of CHF. She underwent home sleep study showing mild OSA with an AHI of 14.1/hr and is now on auto CPAP.    I  was her for virtual appt a few months ago and had problems taking her mask off in her sleep unknowingly.  I swtiched her to a Respironics Dreamware under the nose FFM and is now back for followup.  She is doing well with her CPAP device and thinks that she has gotten used to it.  She is still using the nasal pillow mask and feels the pressure is adequate.  Unfortunately she never go the Under th nose FFm that I ordered and never heard anything from the DME.  She has fibromyalgia and it is hard for her to tell if her energy level is better in the am.   She does have mouth dryness with the nasal pillow mask.  She does not use a chin strap.  She does not think that he snores.    The patient does not have symptoms concerning for COVID-19 infection (fever, chills, cough, or new shortness of breath).    Prior CV studies:   The following studies were reviewed today:  PAP compliance download  Past Medical History:  Diagnosis Date  . Anemia   . CHF (congestive heart failure) (HCC)   . Depression   . Diabetes mellitus without complication (HCC)   . Fibromyalgia   . Hyperlipidemia   . Hypertension   . Hypothyroidism    Past Surgical History:  Procedure Laterality Date  . CARDIAC CATHETERIZATION N/A 09/16/2015   Procedure: Right/Left Heart Cath and Coronary Angiography;  Surgeon: Peter M [place], MD;  Location:  [place] [facility];  Service: Cardiovascular;  Laterality: N/A;  . ENDOMETRIAL ABLATION  2012     Current Meds  Medication Sig  . acetaminophen (TYLENOL) 325 MG tablet Take 650 mg by mouth every 6 (six) hours as needed for mild pain.  Marland Kitchen atorvastatin (LIPITOR) 40 MG tablet Take 40 mg by mouth daily.  . carvedilol (COREG) 25 MG tablet TAKE 1 TABLET BY MOUTH TWICE A DAY WITH MEALS  . Cholecalciferol (VITAMIN D-1000 MAX ST) 1000 units tablet Take 1,000 Units by mouth daily.  . CORLANOR 5 MG TABS tablet TAKE 1 TABLET BY MOUTH TWICE A DAY WITH A MEAL  . cyclobenzaprine (FLEXERIL) 10 MG  tablet Take 10 mg by mouth 2 (two) times daily as needed for muscle spasms (pt takes qhs and PRN).   Marland Kitchen ENTRESTO 49-51 MG TAKE 1 TABLET BY MOUTH TWICE A DAY  . FLUoxetine (PROZAC) 20 MG tablet Take 20 mg by mouth daily.  . furosemide (LASIX) 40 MG tablet TAKE 1 TABLET BY MOUTH TWICE A DAY  . gabapentin (NEURONTIN) 600 MG tablet Take 300-600 mg by mouth 3 (three) times daily as needed (for fibromyalgia). Take 1/2 tablet (300mg ) by mouth in the morning, and afternonn, and 1 tablet (600mg ) at bedtime  . insulin degludec (TRESIBA) 100 UNIT/ML SOPN FlexTouch Pen Inject 30 Units into the skin at bedtime.  . Iron-FA-B Cmp-C-Biot-Probiotic (FUSION PLUS PO) Take 1 capsule by mouth daily.  Marland Kitchen levothyroxine (SYNTHROID, LEVOTHROID) 88 MCG tablet Take 88 mcg by mouth daily before breakfast.  . Liraglutide (VICTOZA) 18 MG/3ML SOPN Inject 1.8 mg into the skin daily.  . metFORMIN (GLUCOPHAGE) 1000 MG tablet Take 1 tablet (1,000 mg total) by mouth 2 (two) times daily with a meal.  . Methylcellulose, Laxative, (MIRAFIBER) 500 MG TABS Take 500 mg by mouth daily as needed.   . Multiple Vitamin (MULTIVITAMIN WITH MINERALS) TABS tablet Take 1 tablet by mouth daily.  Marland Kitchen NOVOTWIST 32G X 5 MM MISC Use as directed with Victozia.  Glory Rosebush VERIO test strip Check blood glucose once daily  . spironolactone (ALDACTONE) 25 MG tablet TAKE 1 TABLET BY MOUTH EVERY DAY  . traMADol (ULTRAM) 50 MG tablet Take 50 mg by mouth every 6 (six) hours as needed for moderate pain (pt takes qhs and PRN).      Allergies:   Macrobid [nitrofurantoin]   Social History   Tobacco Use  . Smoking status: Never Smoker  . Smokeless tobacco: Never Used  Substance Use Topics  . Alcohol use: No  . Drug use: No     Family Hx: The patient's family history includes Atrial fibrillation in her maternal grandmother and mother; Diabetes Mellitus II in her brother and father; Hypertension in her brother, father, maternal grandmother, and mother; Kidney  disease in her father; Pulmonary fibrosis in her mother; Stroke in her maternal grandmother.  ROS:   Please see the history of present illness.     All other systems reviewed and are negative.   Labs/Other Tests and Data Reviewed:    Recent Labs: 05/19/2019: ALT 36 07/08/2019: BUN 13; Creatinine, Ser 1.21; Potassium 4.6; Sodium 138   Recent Lipid Panel Lab Results  Component Value Date/Time   CHOL 158 05/19/2019 01:29 PM   TRIG 110 05/19/2019 01:29 PM   HDL 53 05/19/2019 01:29 PM   CHOLHDL 3.0 05/19/2019 01:29 PM   LDLCALC 83 05/19/2019 01:29 PM    Wt Readings from Last 3 Encounters:  10/12/19 180 lb (81.6 kg)  08/04/19 180 lb (81.6 kg)  07/08/19 183 lb 12.8 oz (83.4 kg)     Objective:    Vital Signs:  Pulse 64   Ht 5\' 6"  (1.676 m)   Wt 180 lb (81.6 kg)   BMI 29.05 kg/m     ASSESSMENT & PLAN:    1.  OSA - The patient is tolerating PAP therapy well without any problems. The PAP download was reviewed today and showed an AHI of 2.9/hr on auto PAP with 33% compliance in using more than 4 hours nightly.  The patient has been using and benefiting from PAP use and will continue to benefit from therapy. I will order her a chin strap to use with her nasal pillow mask to help with her dry mouth.  I will also reorder the Respironics under the nose Dreamwear FFM.  I will see her back in 1 month  2.  HTN -continue Carvedilol 25mg  BID, spiro 25mg  daily and Entresto 49-51mg  BID.  3.  Obesity -I have encouraged her to get into a routine exercise program and cut back on carbs and portions.    COVID-19 Education: The signs and symptoms of COVID-19 were discussed with the patient and how to seek care for testing (follow up with PCP or arrange E-visit).  The importance of social distancing was discussed today.  Patient Risk:   After full review of this patient's clinical status, I feel that they are at least moderate risk at this time.  Time:   Today, I have spent 20 minutes  directly with the patient on telemedicine discussing medical problems including OSA, HTN.  We also reviewed the symptoms of COVID 19 and the ways to protect against contracting the virus with telehealth technology.  I spent an additional 5 minutes reviewing patient's chart including PAP compliance download.  Medication Adjustments/Labs and Tests Ordered: Current medicines are reviewed at length with the patient today.  Concerns regarding medicines are outlined above.  Tests Ordered: No orders of the defined types were placed in this encounter.  Medication Changes: No orders of the defined types were placed in this encounter.   Disposition:  Follow up in 1 year(s)  Signed, , MD  10/12/2019 10:10 AM    [facility]

## 2019-10-12 NOTE — Telephone Encounter (Signed)
Reached out to [place]) to inquire about patients supplies lmtcb on her voicemail.

## 2019-10-13 NOTE — Telephone Encounter (Signed)
Called [employer] spoke to [place] in re-supply who states the Respironics FF Mask was sent out on 08/12/19 but the patient says she never got it. [facility] [employer] says the patient can call back and have the mask sent back out again since she never received it. Patient has been notified to call back and let us know if she does not receive her supplies again. Pt is agreeable to treatment.

## 2019-10-13 NOTE — Telephone Encounter (Signed)
1 month virtual appt made for 12/04/19 8:20.

## 2019-11-05 ENCOUNTER — Telehealth (HOSPITAL_COMMUNITY): Payer: Self-pay

## 2019-11-05 ENCOUNTER — Other Ambulatory Visit: Payer: Self-pay

## 2019-11-05 ENCOUNTER — Ambulatory Visit (HOSPITAL_COMMUNITY)
Admission: RE | Admit: 2019-11-05 | Discharge: 2019-11-05 | Disposition: A | Discharge status: Home / Self Care | Payer: Managed Care, Other (non HMO) | Source: Ambulatory Visit | Attending: Cardiology | Admitting: Cardiology

## 2019-11-05 ENCOUNTER — Encounter (HOSPITAL_COMMUNITY): Payer: Self-pay | Admitting: Cardiology

## 2019-11-05 VITALS — BP 98/70 | HR 63 | Wt 187.4 lb

## 2019-11-05 DIAGNOSIS — Z8249 Family history of ischemic heart disease and other diseases of the circulatory system: Secondary | ICD-10-CM | POA: Insufficient documentation

## 2019-11-05 DIAGNOSIS — E119 Type 2 diabetes mellitus without complications: Secondary | ICD-10-CM | POA: Insufficient documentation

## 2019-11-05 DIAGNOSIS — Z79890 Hormone replacement therapy (postmenopausal): Secondary | ICD-10-CM | POA: Diagnosis not present

## 2019-11-05 DIAGNOSIS — M797 Fibromyalgia: Secondary | ICD-10-CM | POA: Diagnosis not present

## 2019-11-05 DIAGNOSIS — F329 Major depressive disorder, single episode, unspecified: Secondary | ICD-10-CM | POA: Insufficient documentation

## 2019-11-05 DIAGNOSIS — I5022 Chronic systolic (congestive) heart failure: Secondary | ICD-10-CM | POA: Insufficient documentation

## 2019-11-05 DIAGNOSIS — Z79899 Other long term (current) drug therapy: Secondary | ICD-10-CM | POA: Insufficient documentation

## 2019-11-05 DIAGNOSIS — G4733 Obstructive sleep apnea (adult) (pediatric): Secondary | ICD-10-CM | POA: Insufficient documentation

## 2019-11-05 DIAGNOSIS — I428 Other cardiomyopathies: Secondary | ICD-10-CM | POA: Diagnosis not present

## 2019-11-05 DIAGNOSIS — Z794 Long term (current) use of insulin: Secondary | ICD-10-CM | POA: Diagnosis not present

## 2019-11-05 DIAGNOSIS — Z833 Family history of diabetes mellitus: Secondary | ICD-10-CM | POA: Diagnosis not present

## 2019-11-05 DIAGNOSIS — E785 Hyperlipidemia, unspecified: Secondary | ICD-10-CM | POA: Insufficient documentation

## 2019-11-05 DIAGNOSIS — Z7982 Long term (current) use of aspirin: Secondary | ICD-10-CM | POA: Insufficient documentation

## 2019-11-05 DIAGNOSIS — E039 Hypothyroidism, unspecified: Secondary | ICD-10-CM | POA: Insufficient documentation

## 2019-11-05 DIAGNOSIS — B379 Candidiasis, unspecified: Secondary | ICD-10-CM | POA: Insufficient documentation

## 2019-11-05 DIAGNOSIS — I110 Hypertensive heart disease with heart failure: Secondary | ICD-10-CM | POA: Diagnosis not present

## 2019-11-05 LAB — BASIC METABOLIC PANEL
Anion gap: 8 (ref 5–15)
BUN: 21 mg/dL — ABNORMAL HIGH (ref 6–20)
CO2: 30 mmol/L (ref 22–32)
Calcium: 9.6 mg/dL (ref 8.9–10.3)
Chloride: 100 mmol/L (ref 98–111)
Creatinine, Ser: 1.26 mg/dL — ABNORMAL HIGH (ref 0.44–1.00)
GFR calc Af Amer: 55 mL/min — ABNORMAL LOW (ref >60)
GFR calc non Af Amer: 48 mL/min — ABNORMAL LOW (ref >60)
Glucose, Bld: 162 mg/dL — ABNORMAL HIGH (ref 70–99)
Potassium: 5.5 mmol/L — ABNORMAL HIGH (ref 3.5–5.1)
Sodium: 138 mmol/L (ref 135–145)

## 2019-11-05 NOTE — Progress Notes (Signed)
Date:  11/05/2019   ID:  Robin Owen, DOB 11-25-1960, MRN [id number]   Provider location: [place] [facility] Type of Visit: Established patient  PCP:  Hali Marry, MD  Cardiologist:  Dr. Shirlee Latch  Chief Complaint: Shortness of breath   History of Present Illness: Robin Owen is a 59 y.o. female who has a past medical history of HTN, HLD, DMII, hypothyroidism and fibromyalgia (on disability), and CHF.   Admitted 11/16 through 09/17/15 with increased dyspnea. CXR with pulmonary edema. This prompted and ECHO that showed reduced EF 20%. RHC/LHC showed normal coronaries and reduced cardiac index. Cardiac MRI showed some mid-wall late gadolinium enhancement in the septum.  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 She has been lisinopril for some time. Started on coreg and lasix.  Discharge weight was 163 pounds.   She developed AKI and hyperkalemia in the setting of high dose Ibuprofen.  I asked her to stop this and started her on tramadol as needed instead.    Echo in 11/18 showed increase in EF to 50-55%.      Echo 10/24/18 LVEF 45-50%, Grade 1 DD, Mild MR, PA peak pressure 24 mm Hg  She returns today for followup of CHF.  She is using CPAP for OSA.  Still working on blood glucose control.  No lightheadedness.  No significant exertional dyspnea.  No chest pain.  No orthopnea/PND. Weight is up about 4 lbs.    Test/Procedures  09/15/2015 ECHO EF 20% MV mild regurgitation TV mild regurgitation  09/19/2015 CMRI 1. Severely dilated left ventricle with normal wall thickness and severe systolic dysfunction (LVEF 20-25%) with global hypokinesis and akinesis of the entire septal wall and diffuse mid wall late gadolinium enhancement. 2. Moderate RV dilatation with mild moderate systolic dysfunction. 3. Moderate left and right atrial dilatation. 4. Mild to moderate mitral and mild tricuspid regurgitation. 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  RHC/LHC 09/16/2015  RA 7 PCWP 29 CO 3.11 CI 1.7  Normal Cors  Echo (5/17) with EF 35-40%.   Echo (12/17) with EF 35-40%, mild LV dilation.   CPX (12/17) with RER 1.13, peak VO2 20.8, VE/VCO2 slope 30.  Echo (11/18): EF 50-55%.   Echo (12/19): LVEF 45-50%, Grade 1 DD, Mild MR, PA peak pressure 24 mm Hg  Labs 09/15/2015: BNP 2354 Labs 09/16/2015: TSH 1.99 Labs 09/17/2015: K 3.8 Creatinine 0.91 Labs 09/29/2015: K 4.5 Creatinine 1.09 Labs 10/05/2015: K 5.3 Creatinine 1.21, LDL 74, HCT 37.2 Labs 1/17: SPEP negative Labs 2/17: K 3.9, creatinine 1.34, digoxin 0.7, hemoglobin 12.5 Labs 3/17: K 3.9, creatinine 1.25, hemoglobin 10 Labs 4/17: K 4.2, creatinine 1.15 Labs 5/17: K 3.8, creatinine 1.11, HCT 31.7 Labs 6/17: K 5.1, creatinine 0.98, BNP 45 Labs 12/17: K 5, creatinine 1.17, BNP 35 Labs 3/18: K 3.9, creatinine 1.07 Labs 8/18: K 4.3, creatinine 0.94 Labs 2/19: K 5.7, creatinine 1.39, Na 125, hgb 12.6, LDL 102 Labs 5/19: K 4.7, creatinine 1.12 Labs 12/19: K 4.1, creatinine 1.09 Labs 5/20: creatinine 1.13 Labs 7/20: LDL 83, HDL 53, LFTs normal Labs 9/20: K 4.6, creatinine 1.2  Past Medical History:  Diagnosis Date  . Anemia   . CHF (congestive heart failure) (HCC)   . Depression   . Diabetes mellitus without complication (HCC)   . Fibromyalgia   . Hyperlipidemia   . Hypertension   . Hypothyroidism      Current Outpatient Medications  Medication Sig Dispense Refill  . acetaminophen (  TYLENOL) 325 MG tablet Take 650 mg by mouth every 6 (six) hours as needed for mild pain.    Marland Kitchen aspirin 81 MG EC tablet Adult Low Dose Aspirin 81 mg tablet,delayed release  Take 1 tablet every day by oral route.    Marland Kitchen atorvastatin (LIPITOR) 40 MG tablet Take 40 mg by mouth daily.    . carvedilol (COREG) 25 MG tablet TAKE 1 TABLET BY MOUTH TWICE A DAY WITH MEALS 180 tablet 1  . Cholecalciferol (VITAMIN D-1000 MAX ST) 1000 units  tablet Take 1,000 Units by mouth daily.    . CORLANOR 5 MG TABS tablet TAKE 1 TABLET BY MOUTH TWICE A DAY WITH A MEAL 60 tablet 11  . cyclobenzaprine (FLEXERIL) 10 MG tablet Take 10 mg by mouth 2 (two) times daily as needed for muscle spasms (pt takes qhs and PRN).     Marland Kitchen ENTRESTO 49-51 MG TAKE 1 TABLET BY MOUTH TWICE A DAY 60 tablet 3  . FLUoxetine (PROZAC) 20 MG tablet Take 20 mg by mouth daily.    . furosemide (LASIX) 40 MG tablet TAKE 1 TABLET BY MOUTH TWICE A DAY 180 tablet 3  . gabapentin (NEURONTIN) 600 MG tablet Take 300-600 mg by mouth 3 (three) times daily as needed (for fibromyalgia). Take 1/2 tablet (300mg ) by mouth in the morning, and afternonn, and 1 tablet (600mg ) at bedtime    . glimepiride (AMARYL) 2 MG tablet Take 1 tablet twice a day as needed for glucose above 100    . insulin degludec (TRESIBA) 100 UNIT/ML SOPN FlexTouch Pen Inject 30 Units into the skin at bedtime.    . Iron-FA-B Cmp-C-Biot-Probiotic (FUSION PLUS PO) Take 1 capsule by mouth daily.    Marland Kitchen levothyroxine (SYNTHROID, LEVOTHROID) 88 MCG tablet Take 88 mcg by mouth daily before breakfast.    . Liraglutide (VICTOZA) 18 MG/3ML SOPN Inject 1.8 mg into the skin daily.    . metFORMIN (GLUCOPHAGE) 1000 MG tablet Take 1 tablet (1,000 mg total) by mouth 2 (two) times daily with a meal.    . Multiple Vitamin (MULTIVITAMIN WITH MINERALS) TABS tablet Take 1 tablet by mouth daily.    Marland Kitchen NOVOTWIST 32G X 5 MM MISC Use as directed with Victozia.  2  . ONETOUCH VERIO test strip Check blood glucose once daily  2  . spironolactone (ALDACTONE) 25 MG tablet TAKE 1 TABLET BY MOUTH EVERY DAY 90 tablet 2  . traMADol (ULTRAM) 50 MG tablet Take 50 mg by mouth every 6 (six) hours as needed for moderate pain (pt takes qhs and PRN).      No current facility-administered medications for this encounter.    Allergies:   Macrobid [nitrofurantoin]   Social History:  The patient  reports that she has never smoked. She has never used smokeless  tobacco. She reports that she does not drink alcohol or use drugs.   Family History:  The patient's family history includes Atrial fibrillation in her maternal grandmother and mother; Diabetes Mellitus II in her brother and father; Hypertension in her brother, father, maternal grandmother, and mother; Kidney disease in her father; Pulmonary fibrosis in her mother; Stroke in her maternal grandmother.   ROS:  Please see the history of present illness.   All other systems are personally reviewed and negative.   Exam:   BP 98/70   Pulse 63   Wt 85 kg (187 lb 6.4 oz)   SpO2 97%   BMI 30.25 kg/m  General: NAD Neck: No JVD, no thyromegaly  or thyroid nodule.  Lungs: Clear to auscultation bilaterally with normal respiratory effort. CV: Nondisplaced PMI.  Heart regular S1/S2, no S3/S4, no murmur.  No peripheral edema.  No carotid bruit.  Normal pedal pulses.  Abdomen: Soft, nontender, no hepatosplenomegaly, no distention.  Skin: Intact without lesions or rashes.  Neurologic: Alert and oriented x 3.  Psych: Normal affect. Extremities: No clubbing or cyanosis.  HEENT: Normal.   Recent Labs: 05/19/2019: ALT 36 11/05/2019: BUN 21; Creatinine, Ser 1.26; Potassium 5.5; Sodium 138  Personally reviewed   Wt Readings from Last 3 Encounters:  11/05/19 85 kg (187 lb 6.4 oz)  10/12/19 81.6 kg (180 lb)  08/04/19 81.6 kg (180 lb)      ASSESSMENT AND PLAN:  1. Chronic systolic CHF: 09/15/2015 ECHO EF 20%. Had cMRI EF 20-25%, RV mod dilated.  cMRI (11/16) showed an area of mid-wall septal late gadolinium enhancement. NICM perhaps related to HTN versus viral myocarditis. SPEP negative.  09/16/2015 RHC/LHC coronaries ok, low cardiac index 1.7.  Repeat echo in 5/17 and again in 12/17 showed some improvement, EF 35-40%.  CPX in 12/17 showed low normal peak VO2 with no clear cardiopulmonary limitation. Echo in 11/18 showed EF up to 50-55%, echo in 12/19 showed EF 45-50%.   NYHA class II symptoms chronically. She  is not volume overloaded on exam.    - Continue lasix 40 mg BID.  BMET today.  - Continue spironolactone 25 mg daily.   - Continue Coreg 25 mg BID - Continue Entresto 49/51 mg BID.    - Continue ivabradine 5 mg BID - EF out of range for ICD.   - She will followup in 6 months with echo at that time.  2. Fibromyalgia: Disabled since 1996. No change.  3. Diabetes: Off empagliflozin due to recurrent yeast infection.  4. OSA: Now using CPAP.   Signed, Marca Ancona, MD  11/05/2019  [facility] [address] [facility] [place] [address] [phone] (office) [phone] (fax)

## 2019-11-05 NOTE — Patient Instructions (Signed)
Labs today  We will only contact you if something comes back abnormal or we need to make some changes. Otherwise no news is good news!  Your physician has requested that you have an echocardiogram. Echocardiography is a painless test that uses sound waves to create images of your heart. It provides your doctor with information about the size and shape of your heart and how well your heart's chambers and valves are working. This procedure takes approximately one hour. There are no restrictions for this procedure.  Your physician recommends that you schedule a follow-up appointment in: 6 months with Dr Shirlee Latch. You will get a call to schedule this appointment.   Please call office at [phone] option [phone] if you have any questions or concerns.    At the [facility], you and your health needs are our priority. As part of our continuing mission to provide you with exceptional heart care, we have created designated Provider Care Teams. These Care Teams include your primary Cardiologist (physician) and Advanced Practice Providers (APPs- Physician Assistants and Nurse Practitioners) who all work together to provide you with the care you need, when you need it.   You may see any of the following providers on your designated Care Team at your next follow up: Marland Kitchen Dr Arvilla Meres . Dr Marca Ancona . Tonye Becket, NP . Robbie Lis, PA . Karle Plumber, PharmD   Please be sure to bring in all your medications bottles to every appointment.

## 2019-11-05 NOTE — Telephone Encounter (Signed)
-----   Message from Laurey Morale, MD sent at 11/05/2019  4:01 PM EST ----- High K.  Low K diet, make sure no K supplement, repeat BMET Monday or Tuesday.

## 2019-11-10 ENCOUNTER — Other Ambulatory Visit (HOSPITAL_COMMUNITY): Payer: Managed Care, Other (non HMO)

## 2019-11-19 ENCOUNTER — Other Ambulatory Visit: Payer: Self-pay

## 2019-11-19 ENCOUNTER — Ambulatory Visit (HOSPITAL_COMMUNITY)
Admission: RE | Admit: 2019-11-19 | Discharge: 2019-11-19 | Disposition: A | Discharge status: Home / Self Care | Payer: Managed Care, Other (non HMO) | Source: Ambulatory Visit | Attending: Cardiology | Admitting: Cardiology

## 2019-11-19 DIAGNOSIS — I5022 Chronic systolic (congestive) heart failure: Secondary | ICD-10-CM | POA: Diagnosis not present

## 2019-11-19 LAB — BASIC METABOLIC PANEL
Anion gap: 11 (ref 5–15)
BUN: 26 mg/dL — ABNORMAL HIGH (ref 6–20)
CO2: 28 mmol/L (ref 22–32)
Calcium: 9.8 mg/dL (ref 8.9–10.3)
Chloride: 97 mmol/L — ABNORMAL LOW (ref 98–111)
Creatinine, Ser: 1.42 mg/dL — ABNORMAL HIGH (ref 0.44–1.00)
GFR calc Af Amer: 48 mL/min — ABNORMAL LOW (ref >60)
GFR calc non Af Amer: 41 mL/min — ABNORMAL LOW (ref >60)
Glucose, Bld: 131 mg/dL — ABNORMAL HIGH (ref 70–99)
Potassium: 5.3 mmol/L — ABNORMAL HIGH (ref 3.5–5.1)
Sodium: 136 mmol/L (ref 135–145)

## 2019-11-20 ENCOUNTER — Telehealth (HOSPITAL_COMMUNITY): Payer: Self-pay

## 2019-11-20 DIAGNOSIS — I5022 Chronic systolic (congestive) heart failure: Secondary | ICD-10-CM

## 2019-11-20 NOTE — Telephone Encounter (Signed)
Pt aware of results and recommendations to follow low K diet. Pt verbalized understanding. Appt made for 2 weeks.

## 2019-11-20 NOTE — Telephone Encounter (Signed)
-----   Message from Laurey Morale, MD sent at 11/19/2019  3:53 PM EST ----- Lower K in diet. BMET 2 wks.

## 2019-11-30 ENCOUNTER — Other Ambulatory Visit (HOSPITAL_COMMUNITY): Payer: Self-pay | Admitting: Cardiology

## 2019-12-03 ENCOUNTER — Other Ambulatory Visit: Payer: Self-pay

## 2019-12-03 ENCOUNTER — Ambulatory Visit (HOSPITAL_COMMUNITY)
Admission: RE | Admit: 2019-12-03 | Discharge: 2019-12-03 | Disposition: A | Discharge status: Home / Self Care | Payer: Managed Care, Other (non HMO) | Source: Ambulatory Visit | Attending: Internal Medicine | Admitting: Internal Medicine

## 2019-12-03 DIAGNOSIS — I5022 Chronic systolic (congestive) heart failure: Secondary | ICD-10-CM | POA: Diagnosis present

## 2019-12-03 LAB — BASIC METABOLIC PANEL
Anion gap: 11 (ref 5–15)
BUN: 32 mg/dL — ABNORMAL HIGH (ref 6–20)
CO2: 28 mmol/L (ref 22–32)
Calcium: 10.1 mg/dL (ref 8.9–10.3)
Chloride: 101 mmol/L (ref 98–111)
Creatinine, Ser: 1.48 mg/dL — ABNORMAL HIGH (ref 0.44–1.00)
GFR calc Af Amer: 45 mL/min — ABNORMAL LOW (ref >60)
GFR calc non Af Amer: 39 mL/min — ABNORMAL LOW (ref >60)
Glucose, Bld: 101 mg/dL — ABNORMAL HIGH (ref 70–99)
Potassium: 5.7 mmol/L — ABNORMAL HIGH (ref 3.5–5.1)
Sodium: 140 mmol/L (ref 135–145)

## 2019-12-03 NOTE — Progress Notes (Signed)
Virtual Visit via Telephone Note   This visit type was conducted due to national recommendations for restrictions regarding the COVID-19 Pandemic (e.g. social distancing) in an effort to limit this patient's exposure and mitigate transmission in our community.  Due to her co-morbid illnesses, this patient is at least at moderate risk for complications without adequate follow up.  This format is felt to be most appropriate for this patient at this time.  All issues noted in this document were discussed and addressed.  A limited physical exam was performed with this format.  Please refer to the patient's chart for her consent to telehealth for [facility].   Evaluation Performed:  Follow-up visit  This visit type was conducted due to national recommendations for restrictions regarding the COVID-19 Pandemic (e.g. social distancing).  This format is felt to be most appropriate for this patient at this time.  All issues noted in this document were discussed and addressed.  No physical exam was performed (except for noted visual exam findings with Video Visits).  Please refer to the patient's chart (MyChart message for video visits and phone note for telephone visits) for the patient's consent to telehealth for [facility].  Date:  12/05/2019   ID:  Robin Owen, DOB [birth year], MRN [id number]  Patient Location:  Home  Provider location:   [place]  PCP:  [place], Casimiro Needle, MD  Cardiologist:  Marca Ancona, MD Sleep Medicine:  Armanda Magic, MD Electrophysiologist:  None   Chief Complaint:  OSA  History of Present Illness:    Robin Owen is a 59 y.o. female who presents via audio/video conferencing for a telehealth visit today.    This is a 59yo female with a hx of CHF, HTN and HLD who was referred for sleep study.Dr. Shirlee Latch ordered the sleep study due to excessive daytime sleepiness, snoring and hx of CHF. She underwent home sleep study showing mild OSA with an AHI of 14.1/hr  and is now onautoCPAP.  I saw her for a  virtual appt a few months ago and had problems taking her mask off in her sleep unknowingly.  I swtiched her to a Respironics Dreamware under the nose FFM and is now back for followup.  She is now back for followup.  Since she got her new mask she feels like she is suffocating.  She is doing well with her CPAP device and thinks that she has gotten used to it.  She tolerates the mask and feels the pressure is adequate.  Since going on CPAP she feels rested in the am and has no significant daytime sleepiness.  She denies any significant mouth or nasal dryness or nasal congestion.  SHe does not think that he snores.    The patient does not have symptoms concerning for COVID-19 infection (fever, chills, cough, or new shortness of breath).    Prior CV studies:   The following studies were reviewed today:  PAP compliance download from [facility]  Past Medical History:  Diagnosis Date  . Anemia   . CHF (congestive heart failure) (HCC)   . Depression   . Diabetes mellitus without complication (HCC)   . Fibromyalgia   . Hyperlipidemia   . Hypertension   . Hypothyroidism    Past Surgical History:  Procedure Laterality Date  . CARDIAC CATHETERIZATION N/A 09/16/2015   Procedure: Right/Left Heart Cath and Coronary Angiography;  Surgeon: Peter M Swaziland, MD;  Location: [facility] INVASIVE CV LAB;  Service: Cardiovascular;  Laterality: N/A;  . ENDOMETRIAL  ABLATION  2012     Current Meds  Medication Sig  . acetaminophen (TYLENOL) 325 MG tablet Take 650 mg by mouth every 6 (six) hours as needed for mild pain.  Marland Kitchen aspirin 81 MG EC tablet Adult Low Dose Aspirin 81 mg tablet,delayed release  Take 1 tablet every day by oral route.  Marland Kitchen atorvastatin (LIPITOR) 40 MG tablet Take 40 mg by mouth daily.  . carvedilol (COREG) 25 MG tablet TAKE 1 TABLET BY MOUTH TWICE A DAY WITH MEALS  . Cholecalciferol (VITAMIN D-1000 MAX ST) 1000 units tablet Take 1,000 Units by mouth daily.   . CORLANOR 5 MG TABS tablet TAKE 1 TABLET BY MOUTH TWICE A DAY WITH A MEAL  . cyclobenzaprine (FLEXERIL) 10 MG tablet Take 10 mg by mouth 2 (two) times daily as needed for muscle spasms (pt takes qhs and PRN).   Marland Kitchen ENTRESTO 49-51 MG TAKE 1 TABLET BY MOUTH TWICE A DAY  . FLUoxetine (PROZAC) 20 MG tablet Take 20 mg by mouth daily.  . furosemide (LASIX) 40 MG tablet TAKE 1 TABLET BY MOUTH TWICE A DAY  . gabapentin (NEURONTIN) 600 MG tablet Take 300-600 mg by mouth 3 (three) times daily as needed (for fibromyalgia). Take 1/2 tablet (300mg ) by mouth in the morning, and afternonn, and 1 tablet (600mg ) at bedtime  . glimepiride (AMARYL) 2 MG tablet Take 1 tablet twice a day as needed for glucose above 100  . insulin degludec (TRESIBA) 100 UNIT/ML SOPN FlexTouch Pen Inject 30 Units into the skin at bedtime.  . Iron-FA-B Cmp-C-Biot-Probiotic (FUSION PLUS PO) Take 1 capsule by mouth daily.  levothyroxine (SYNTHROID, LEVOTHROID) 88 MCG tablet Take 88 mcg by mouth daily before breakfast.  . Liraglutide (VICTOZA) 18 MG/3ML SOPN Inject 1.8 mg into the skin daily.  . metFORMIN (GLUCOPHAGE) 1000 MG tablet Take 1 tablet (1,000 mg total) by mouth 2 (two) times daily with a meal.  . Multiple Vitamin (MULTIVITAMIN WITH MINERALS) TABS tablet Take 1 tablet by mouth daily.  NOVOTWIST 32G X 5 MM MISC Use as directed with Victozia.  Marland Kitchen VERIO test strip Check blood glucose once daily  . traMADol (ULTRAM) 50 MG tablet Take 50 mg by mouth every 6 (six) hours as needed for moderate pain (pt takes qhs and PRN).   . [DISCONTINUED] spironolactone (ALDACTONE) 25 MG tablet TAKE 1 TABLET BY MOUTH EVERY DAY     Allergies:   Macrobid [nitrofurantoin]   Social History   Tobacco Use  . Smoking status: Never Smoker  . Smokeless tobacco: Never Used  Substance Use Topics  . Alcohol use: No  . Drug use: No     Family Hx: The patient's family history includes Atrial fibrillation in her maternal grandmother and  mother; Diabetes Mellitus II in her brother and father; Hypertension in her brother, father, maternal grandmother, and mother; Kidney disease in her father; Pulmonary fibrosis in her mother; Stroke in her maternal grandmother.  ROS:   Please see the history of present illness.     All other systems reviewed and are negative.   Labs/Other Tests and Data Reviewed:    Recent Labs: 05/19/2019: ALT 36 12/03/2019: BUN 32; Creatinine, Ser 1.48; Potassium 5.7; Sodium 140   Recent Lipid Panel Lab Results  Component Value Date/Time   CHOL 158 05/19/2019 01:29 PM   TRIG 110 05/19/2019 01:29 PM   HDL 53 05/19/2019 01:29 PM   CHOLHDL 3.0 05/19/2019 01:29 PM   LDLCALC 83 05/19/2019 01:29 PM  Wt Readings from Last 3 Encounters:  12/04/19 180 lb (81.6 kg)  11/05/19 187 lb 6.4 oz (85 kg)  10/12/19 180 lb (81.6 kg)     Objective:    Vital Signs:  Pulse 85   Ht 5\' 6"  (1.676 m)   Wt 180 lb (81.6 kg)   BMI 29.05 kg/m    CONSTITUTIONAL:  Well nourished, well developed female in no acute distress.  EYES: anicteric MOUTH: oral mucosa is pink RESPIRATORY: Normal respiratory effort, symmetric expansion CARDIOVASCULAR: No peripheral edema SKIN: No rash, lesions or ulcers MUSCULOSKELETAL: no digital cyanosis NEURO: Cranial Nerves II-XII grossly intact, moves all extremities PSYCH: Intact judgement and insight.  A&O x 3, Mood/affect appropriate   ASSESSMENT & PLAN:    1.  OSA - he patient is tolerating PAP therapy well without any problems. The PAP download was reviewed today and showed an AHI of 1.7/hr auto PAP with 23% compliance in using more than 4 hours nightly.  Unfortunately the patient is now having problems with the full face mask in that she is very claustrophobic.  I have recommended ordering a [employer] Airfit P30i with the nasal cushion instead of pillows and see if she can keep that on her face better than the P10.  Will get a download in 2 weeks and see her back in 4 weeks. In the  mean time I have recommended that she try wearing the full face mask some during the day time to try to get desensitized.  2.  HTN -continue Spiro 25mg  daily, Carvedilol 25mg  BID and Entresto 49-51mg  BID  3.  Obesity -I have encouraged her to get into a routine exercise program and cut back on carbs and portions.    COVID-19 Education: The signs and symptoms of COVID-19 were discussed with the patient and how to seek care for testing (follow up with PCP or arrange E-visit).  The importance of social distancing was discussed today.  Patient Risk:   After full review of this patient's clinical status, I feel that they are at least moderate risk at this time.  Time:   Today, I have spent 20 minutes on telemedicine, as well as reviewing OV from Dr. Aundra Dubin in [place] [facility], PAP compliance from [facility] and discussing medical problems including OSA, HTN, Obesity.    Medication Adjustments/Labs and Tests Ordered: Current medicines are reviewed at length with the patient today.  Concerns regarding medicines are outlined above.  Tests Ordered: No orders of the defined types were placed in this encounter.  Medication Changes: No orders of the defined types were placed in this encounter.   Disposition:  Follow up in 1 year(s)  Signed, Fransico Him, MD  12/05/2019 4:46 PM    [facility]

## 2019-12-04 ENCOUNTER — Telehealth: Payer: Self-pay | Admitting: *Deleted

## 2019-12-04 ENCOUNTER — Telehealth (INDEPENDENT_AMBULATORY_CARE_PROVIDER_SITE_OTHER): Payer: Managed Care, Other (non HMO) | Admitting: Cardiology

## 2019-12-04 VITALS — HR 85 | Ht 66.0 in | Wt 180.0 lb

## 2019-12-04 DIAGNOSIS — I10 Essential (primary) hypertension: Secondary | ICD-10-CM

## 2019-12-04 DIAGNOSIS — E669 Obesity, unspecified: Secondary | ICD-10-CM

## 2019-12-04 DIAGNOSIS — G4733 Obstructive sleep apnea (adult) (pediatric): Secondary | ICD-10-CM | POA: Diagnosis not present

## 2019-12-04 DIAGNOSIS — I5022 Chronic systolic (congestive) heart failure: Secondary | ICD-10-CM

## 2019-12-04 MED ORDER — SPIRONOLACTONE 25 MG PO TABS: 12.5000 mg | ORAL_TABLET | Freq: Every day | ORAL | 1 refills | Status: DC

## 2019-12-04 NOTE — Telephone Encounter (Signed)
Laurey Morale, MD  12/03/2019 6:52 PM EST    Hold spironolactone for 2 days then decrease to 12.5 mg daily. Make sure no K supplement. Low K diet. Repeat BMET in 1 week.    I called patient to do telemedicine visit with Dr Mayford Knife and reviewed instructions from Dr Shirlee Latch with her.  I told her the CHF clinic would contact her to arrange lab work. She is not taking any potassium supplements. Is following a low potassium diet

## 2019-12-04 NOTE — Patient Instructions (Addendum)
Medication Instructions:  Your physician recommends that you continue on your current medications as directed. Please refer to the Current Medication list given to you today.  *If you need a refill on your cardiac medications before your next appointment, please call your pharmacy*  Lab Work: none If you have labs (blood work) drawn today and your tests are completely normal, you will receive your results only by: Marland Kitchen MyChart Message (if you have MyChart) OR . A paper copy in the mail If you have any lab test that is abnormal or we need to change your treatment, we will call you to review the results.  Testing/Procedures: none  Follow-Up: At [facility], you and your health needs are our priority.  As part of our continuing mission to provide you with exceptional heart care, we have created designated Provider Care Teams.  These Care Teams include your primary Cardiologist (physician) and Advanced Practice Providers (APPs -  Physician Assistants and Nurse Practitioners) who all work together to provide you with the care you need, when you need it.  Your next appointment:   4 week(s) --March 4,2021 at 9:40  The format for your next appointment:   Virtual Visit   Provider:   Armanda Magic, MD  Other Instructions

## 2019-12-04 NOTE — Telephone Encounter (Signed)
Pt able to repeat instructions.  Med changes made and appt scheduled for 1 week.

## 2019-12-05 ENCOUNTER — Telehealth: Payer: Self-pay | Admitting: *Deleted

## 2019-12-05 DIAGNOSIS — G4733 Obstructive sleep apnea (adult) (pediatric): Secondary | ICD-10-CM

## 2019-12-05 NOTE — Telephone Encounter (Signed)
Order placed to [facility] electronically to:  order a ResMed Airfit P30i with the nasal cushion and not the pillows

## 2019-12-05 NOTE — Telephone Encounter (Signed)
-----   Message from Dossie Arbour, RN sent at 12/04/2019  8:35 AM EST ----- Regarding: follow up Please send a note to [facility] to order a [employer] P30i with the nasal cushion and not the pillows.  FOllowup with me in 4 weeks virtual

## 2019-12-11 ENCOUNTER — Encounter (HOSPITAL_COMMUNITY): Payer: Self-pay

## 2019-12-11 ENCOUNTER — Ambulatory Visit (HOSPITAL_COMMUNITY): Admission: RE | Admit: 2019-12-11 | Payer: Managed Care, Other (non HMO) | Source: Ambulatory Visit

## 2019-12-11 ENCOUNTER — Other Ambulatory Visit: Payer: Self-pay

## 2019-12-16 ENCOUNTER — Ambulatory Visit (HOSPITAL_COMMUNITY)
Admission: RE | Admit: 2019-12-16 | Discharge: 2019-12-16 | Disposition: A | Discharge status: Home / Self Care | Payer: Managed Care, Other (non HMO) | Source: Ambulatory Visit | Attending: Internal Medicine | Admitting: Internal Medicine

## 2019-12-16 ENCOUNTER — Other Ambulatory Visit: Payer: Self-pay

## 2019-12-16 DIAGNOSIS — I5022 Chronic systolic (congestive) heart failure: Secondary | ICD-10-CM | POA: Diagnosis not present

## 2019-12-16 LAB — BASIC METABOLIC PANEL
Anion gap: 10 (ref 5–15)
BUN: 21 mg/dL — ABNORMAL HIGH (ref 6–20)
CO2: 29 mmol/L (ref 22–32)
Calcium: 9.4 mg/dL (ref 8.9–10.3)
Chloride: 100 mmol/L (ref 98–111)
Creatinine, Ser: 1.35 mg/dL — ABNORMAL HIGH (ref 0.44–1.00)
GFR calc Af Amer: 51 mL/min — ABNORMAL LOW (ref >60)
GFR calc non Af Amer: 44 mL/min — ABNORMAL LOW (ref >60)
Glucose, Bld: 81 mg/dL (ref 70–99)
Potassium: 4.6 mmol/L (ref 3.5–5.1)
Sodium: 139 mmol/L (ref 135–145)

## 2019-12-17 ENCOUNTER — Other Ambulatory Visit (HOSPITAL_COMMUNITY): Payer: Managed Care, Other (non HMO)

## 2019-12-31 ENCOUNTER — Telehealth (INDEPENDENT_AMBULATORY_CARE_PROVIDER_SITE_OTHER): Payer: Managed Care, Other (non HMO) | Admitting: Cardiology

## 2019-12-31 ENCOUNTER — Other Ambulatory Visit: Payer: Self-pay

## 2019-12-31 ENCOUNTER — Encounter: Payer: Self-pay | Admitting: Cardiology

## 2019-12-31 VITALS — HR 64 | Ht 66.0 in | Wt 180.0 lb

## 2019-12-31 DIAGNOSIS — G4733 Obstructive sleep apnea (adult) (pediatric): Secondary | ICD-10-CM | POA: Diagnosis not present

## 2019-12-31 DIAGNOSIS — E669 Obesity, unspecified: Secondary | ICD-10-CM

## 2019-12-31 DIAGNOSIS — I10 Essential (primary) hypertension: Secondary | ICD-10-CM

## 2019-12-31 NOTE — Progress Notes (Signed)
Virtual Visit via Video Note   This visit type was conducted due to national recommendations for restrictions regarding the COVID-19 Pandemic (e.g. social distancing) in an effort to limit this patient's exposure and mitigate transmission in our community.  Due to her co-morbid illnesses, this patient is at least at moderate risk for complications without adequate follow up.  This format is felt to be most appropriate for this patient at this time.  All issues noted in this document were discussed and addressed.  A limited physical exam was performed with this format.  Please refer to the patient's chart for her consent to telehealth for [facility]   Evaluation Performed:  Follow-up visit  This visit type was conducted due to national recommendations for restrictions regarding the COVID-19 Pandemic (e.g. social distancing).  This format is felt to be most appropriate for this patient at this time.  All issues noted in this document were discussed and addressed.  No physical exam was performed (except for noted visual exam findings with Video Visits).  Please refer to the patient's chart (MyChart message for video visits and phone note for telephone visits) for the patient's consent to telehealth for [facility].  Date:  12/31/2019   ID:  Robin Owen, DOB 1961-03-01, MRN [id number]  Patient Location:  Home  Provider location:   [place]  PCP:  Altheimer, Casimiro Needle, MD  Cardiologist:  Armanda Magic, MD  Electrophysiologist:  None   Chief Complaint:  OSA  History of Present Illness:    Robin Owen is a 59 y.o. female who presents via audio/video conferencing for a telehealth visit today.    This is a 59yo female with a hx of CHF, HTN and HLD who was referred for sleep study.Dr. Shirlee Latch ordered the sleep study due to excessive daytime sleepiness, snoring and hx of CHF. She underwent home sleep study showing mild OSA with an AHI of 14.1/hr and is now onautoCPAP.  I saw  her for a  virtual appt a few months ago and had problems taking her mask off in her sleep unknowingly. I swtiched her to a Respironics Dreamware under the nose FFM.  Unfortunately with her new mask she felt like she was suffocating and has bad claustrophobia.  I switched her to a ResMed Airfit P30i with nasal cushion mask and she is now here for followup. She just got her new mask and is just trying to get used to it.  She has only used it for 2 night.  This am she woke up with the mask off and is not sure how long she had it on.    The patient does not have symptoms concerning for COVID-19 infection (fever, chills, cough, or new shortness of breath).   Prior CV studies:   The following studies were reviewed today:  PAP compliance download on Airview  Past Medical History:  Diagnosis Date  . Anemia   . CHF (congestive heart failure) (HCC)   . Depression   . Diabetes mellitus without complication (HCC)   . Fibromyalgia   . Hyperlipidemia   . Hypertension   . Hypothyroidism    Past Surgical History:  Procedure Laterality Date  . CARDIAC CATHETERIZATION N/A 09/16/2015   Procedure: Right/Left Heart Cath and Coronary Angiography;  Surgeon: Peter M Swaziland, MD;  Location: [facility] INVASIVE CV LAB;  Service: Cardiovascular;  Laterality: N/A;  . ENDOMETRIAL ABLATION  2012     Current Meds  Medication Sig  . acetaminophen (TYLENOL) 325 MG tablet  Take 650 mg by mouth every 6 (six) hours as needed for mild pain.  Marland Kitchen aspirin 81 MG EC tablet Adult Low Dose Aspirin 81 mg tablet,delayed release  Take 1 tablet every day by oral route.  Marland Kitchen atorvastatin (LIPITOR) 40 MG tablet Take 40 mg by mouth daily.  . carvedilol (COREG) 25 MG tablet TAKE 1 TABLET BY MOUTH TWICE A DAY WITH MEALS  . Cholecalciferol (VITAMIN D-1000 MAX ST) 1000 units tablet Take 1,000 Units by mouth daily.  . CORLANOR 5 MG TABS tablet TAKE 1 TABLET BY MOUTH TWICE A DAY WITH A MEAL  . cyclobenzaprine (FLEXERIL) 10 MG tablet Take 10 mg by  mouth 2 (two) times daily as needed for muscle spasms (pt takes qhs and PRN).   Marland Kitchen ENTRESTO 49-51 MG TAKE 1 TABLET BY MOUTH TWICE A DAY  . FLUoxetine (PROZAC) 20 MG tablet Take 20 mg by mouth daily.  . furosemide (LASIX) 40 MG tablet TAKE 1 TABLET BY MOUTH TWICE A DAY  . gabapentin (NEURONTIN) 600 MG tablet Take 300-600 mg by mouth 3 (three) times daily as needed (for fibromyalgia). Take 1/2 tablet (300mg ) by mouth in the morning, and afternonn, and 1 tablet (600mg ) at bedtime  . glimepiride (AMARYL) 2 MG tablet Take 1 tablet twice a day as needed for glucose above 100  . insulin degludec (TRESIBA) 100 UNIT/ML SOPN FlexTouch Pen Inject 30 Units into the skin at bedtime.  . Iron-FA-B Cmp-C-Biot-Probiotic (FUSION PLUS PO) Take 1 capsule by mouth daily.  levothyroxine (SYNTHROID, LEVOTHROID) 88 MCG tablet Take 88 mcg by mouth daily before breakfast.  . Liraglutide (VICTOZA) 18 MG/3ML SOPN Inject 1.8 mg into the skin daily.  . metFORMIN (GLUCOPHAGE) 1000 MG tablet Take 1 tablet (1,000 mg total) by mouth 2 (two) times daily with a meal.  . Multiple Vitamin (MULTIVITAMIN WITH MINERALS) TABS tablet Take 1 tablet by mouth daily.  NOVOTWIST 32G X 5 MM MISC Use as directed with Victozia.  Marland Kitchen VERIO test strip Check blood glucose once daily  . spironolactone (ALDACTONE) 25 MG tablet Take 0.5 tablets (12.5 mg total) by mouth daily.  . traMADol (ULTRAM) 50 MG tablet Take 50 mg by mouth every 6 (six) hours as needed for moderate pain (pt takes qhs and PRN).      Allergies:   Macrobid [nitrofurantoin]   Social History   Tobacco Use  . Smoking status: Never Smoker  . Smokeless tobacco: Never Used  Substance Use Topics  . Alcohol use: No  . Drug use: No     Family Hx: The patient's family history includes Atrial fibrillation in her maternal grandmother and mother; Diabetes Mellitus II in her brother and father; Hypertension in her brother, father, maternal grandmother, and mother; Kidney  disease in her father; Pulmonary fibrosis in her mother; Stroke in her maternal grandmother.  ROS:   Please see the history of present illness.     All other systems reviewed and are negative.   Labs/Other Tests and Data Reviewed:    Recent Labs: 05/19/2019: ALT 36 12/16/2019: BUN 21; Creatinine, Ser 1.35; Potassium 4.6; Sodium 139   Recent Lipid Panel Lab Results  Component Value Date/Time   CHOL 158 05/19/2019 01:29 PM   TRIG 110 05/19/2019 01:29 PM   HDL 53 05/19/2019 01:29 PM   CHOLHDL 3.0 05/19/2019 01:29 PM   LDLCALC 83 05/19/2019 01:29 PM    Wt Readings from Last 3 Encounters:  12/31/19 180 lb (81.6 kg)  12/04/19 180 lb (81.6  kg)  11/05/19 187 lb 6.4 oz (85 kg)     Objective:    Vital Signs:  Pulse 64   Ht 5\' 6"  (1.676 m)   Wt 180 lb (81.6 kg)   BMI 29.05 kg/m     ASSESSMENT & PLAN:    1.  OSA - The patient is tolerating PAP therapy well without any problems. The PAP download was reviewed today and showed an AHI of 1.9/hr on auto PAP with 27% compliance in using more than 4 hours nightly.  The patient has been using and benefiting from PAP use and will continue to benefit from therapy.   2.  HTN -BP controlled on exam -continue Spiro 25mg  daily, Carvedilol 25mg  BID and Entresto 49-51mg  BID.   COVID-19 Education: The signs and symptoms of COVID-19 were discussed with the patient and how to seek care for testing (follow up with PCP or arrange E-visit).  The importance of social distancing was discussed today.  Patient Risk:   After full review of this patient's clinical status, I feel that they are at least moderate risk at this time.  Time:   Today, I have spent 15 minutes on telemedicine discussing medical problems including OSA, HTN, obesity and reviewing patient's chart including PAP compliance download on Airview and prior OV notes.  Medication Adjustments/Labs and Tests Ordered: Current medicines are reviewed at length with the patient today.   Concerns regarding medicines are outlined above.  Tests Ordered: No orders of the defined types were placed in this encounter.  Medication Changes: No orders of the defined types were placed in this encounter.   Disposition:  Follow up in 4 weeks  Signed, Fransico Him, MD  12/31/2019 9:40 AM    [place]

## 2019-12-31 NOTE — Patient Instructions (Signed)
Medication Instructions:  Your physician recommends that you continue on your current medications as directed. Please refer to the Current Medication list given to you today.  *If you need a refill on your cardiac medications before your next appointment, please call your pharmacy*  Follow-Up: At [facility], you and your health needs are our priority.  As part of our continuing mission to provide you with exceptional heart care, we have created designated Provider Care Teams.  These Care Teams include your primary Cardiologist (physician) and Advanced Practice Providers (APPs -  Physician Assistants and Nurse Practitioners) who all work together to provide you with the care you need, when you need it.  We recommend signing up for the patient portal called "MyChart".  Sign up information is provided on this After Visit Summary.  MyChart is used to connect with patients for Virtual Visits (Telemedicine).  Patients are able to view lab/test results, encounter notes, upcoming appointments, etc.  Non-urgent messages can be sent to your provider as well.   To learn more about what you can do with MyChart, go to [email or web address].    Your next appointment:   4 week(s)  The format for your next appointment:   In Person  Provider:   Armanda Magic, MD

## 2020-02-06 ENCOUNTER — Other Ambulatory Visit (HOSPITAL_COMMUNITY): Payer: Self-pay | Admitting: Cardiology

## 2020-02-08 ENCOUNTER — Telehealth: Payer: Managed Care, Other (non HMO) | Admitting: Cardiology

## 2020-02-09 ENCOUNTER — Telehealth: Payer: Managed Care, Other (non HMO) | Admitting: Cardiology

## 2020-02-12 ENCOUNTER — Encounter: Payer: Self-pay | Admitting: Cardiology

## 2020-02-12 ENCOUNTER — Telehealth (INDEPENDENT_AMBULATORY_CARE_PROVIDER_SITE_OTHER): Payer: Managed Care, Other (non HMO) | Admitting: Cardiology

## 2020-02-12 VITALS — HR 67 | Ht 66.0 in | Wt 180.0 lb

## 2020-02-12 DIAGNOSIS — I10 Essential (primary) hypertension: Secondary | ICD-10-CM

## 2020-02-12 DIAGNOSIS — G4733 Obstructive sleep apnea (adult) (pediatric): Secondary | ICD-10-CM | POA: Diagnosis not present

## 2020-02-12 NOTE — Progress Notes (Signed)
Virtual Visit via Telephone Note   This visit type was conducted due to national recommendations for restrictions regarding the COVID-19 Pandemic (e.g. social distancing) in an effort to limit this patient's exposure and mitigate transmission in our community.  Due to her co-morbid illnesses, this patient is at least at moderate risk for complications without adequate follow up.  This format is felt to be most appropriate for this patient at this time.  The patient did not have access to video technology/had technical difficulties with video requiring transitioning to audio format only (telephone).  All issues noted in this document were discussed and addressed.  No physical exam could be performed with this format.  Please refer to the patient's chart for her  consent to telehealth for [facility].  Evaluation Performed:  Follow-up visit  This visit type was conducted due to national recommendations for restrictions regarding the COVID-19 Pandemic (e.g. social distancing).  This format is felt to be most appropriate for this patient at this time.  All issues noted in this document were discussed and addressed.  No physical exam was performed (except for noted visual exam findings with Video Visits).  Please refer to the patient's chart (MyChart message for video visits and phone note for telephone visits) for the patient's consent to telehealth for [facility]  Date:  02/12/2020   ID:  Robin Owen, DOB 10-26-1961, MRN [id number]  Patient Location:  Home  Provider location:   [place]  PCP:  Altheimer, Casimiro Needle, MD  Cardiologist:  Armanda Magic, MD  Electrophysiologist:  None   Chief Complaint:  OSA  History of Present Illness:    Robin Owen is a 59 y.o. female who presents via audio/video conferencing for a telehealth visit today.     This is a 59yo female with a hx of CHF, HTN and HLD who was referred for sleep study.Dr. Shirlee Latch ordered the sleep study due to excessive  daytime sleepiness, snoring and hx of CHF. She underwent home sleep study showing mild OSA with an AHI of 14.1/hr and is now onautoCPAP.  I saw her for avirtual appt a few months ago and had problems taking her mask off in her sleep unknowingly. I swtiched her to a Respironics Dreamware under the nose FFM.  Unfortunately with her new mask she felt like she was suffocating and has bad claustrophobia.  I switched her to a [employer] P30i with nasal cushion mask but when I saw her last she had just gotten the mask and had not used it but one night. She just got her new mask and is just trying to get used to it.    She is doing well with her CPAP device and thinks that she has gotten used to it.  She tolerates the mask and feels the pressure is adequate.  Since going on CPAP she feels rested in the am and has no significant daytime sleepiness.  She denies any significant mouth or nasal dryness or nasal congestion.  She does not think that he snores.  Since getting the new mask she is doing much better.  She has been using her mask nightly and this one stays on much better.  She uses it at least 4 hours and 30 minutes nightly and has only missed 7 days due to a GI illness.   The patient does not have symptoms concerning for COVID-19 infection (fever, chills, cough, or new shortness of breath).    Prior CV studies:   The following  studies were reviewed today:  PAP compliance download  Past Medical History:  Diagnosis Date  . Anemia   . CHF (congestive heart failure) ([place])   . Depression   . Diabetes mellitus without complication ([place])   . Fibromyalgia   . Hyperlipidemia   . Hypertension   . Hypothyroidism    Past Surgical History:  Procedure Laterality Date  . CARDIAC CATHETERIZATION N/A 09/16/2015   Procedure: Right/Left Heart Cath and Coronary Angiography;  Surgeon: Peter M Martinique, MD;  Location: [facility] CV LAB;  Service: Cardiovascular;  Laterality: N/A;  . ENDOMETRIAL  ABLATION  2012     Current Meds  Medication Sig  . acetaminophen (TYLENOL) 325 MG tablet Take 650 mg by mouth every 6 (six) hours as needed for mild pain.  Marland Kitchen aspirin 81 MG EC tablet Adult Low Dose Aspirin 81 mg tablet,delayed release  Take 1 tablet every day by oral route.  Marland Kitchen atorvastatin (LIPITOR) 40 MG tablet Take 20 mg by mouth daily.   . carvedilol (COREG) 25 MG tablet TAKE 1 TABLET BY MOUTH TWICE A DAY WITH MEALS  . Cholecalciferol (VITAMIN D-1000 MAX ST) 1000 units tablet Take 1,000 Units by mouth daily.  . CORLANOR 5 MG TABS tablet TAKE 1 TABLET BY MOUTH TWICE A DAY WITH A MEAL  . cyclobenzaprine (FLEXERIL) 10 MG tablet Take 10 mg by mouth 2 (two) times daily as needed for muscle spasms (pt takes qhs and PRN).   Marland Kitchen ENTRESTO 49-51 MG TAKE 1 TABLET BY MOUTH TWICE A DAY  . FLUoxetine (PROZAC) 20 MG tablet Take 20 mg by mouth daily.  . furosemide (LASIX) 40 MG tablet TAKE 1 TABLET BY MOUTH TWICE A DAY  . gabapentin (NEURONTIN) 600 MG tablet Take 300-600 mg by mouth 3 (three) times daily as needed (for fibromyalgia). Take 1/2 tablet (300mg ) by mouth in the morning, and afternonn, and 1 tablet (600mg ) at bedtime  . glimepiride (AMARYL) 2 MG tablet Take 1 tablet twice a day as needed for glucose above 100  . insulin degludec (TRESIBA) 100 UNIT/ML SOPN FlexTouch Pen Inject 26 Units into the skin at bedtime.   . Iron-FA-B Cmp-C-Biot-Probiotic (FUSION PLUS PO) Take 1 capsule by mouth daily.  Marland Kitchen levothyroxine (SYNTHROID, LEVOTHROID) 88 MCG tablet Take 88 mcg by mouth daily before breakfast.  . Liraglutide (VICTOZA) 18 MG/3ML SOPN Inject 1.8 mg into the skin daily.  . metFORMIN (GLUCOPHAGE) 1000 MG tablet Take 1 tablet (1,000 mg total) by mouth 2 (two) times daily with a meal.  . Multiple Vitamin (MULTIVITAMIN WITH MINERALS) TABS tablet Take 1 tablet by mouth daily.  Marland Kitchen NOVOTWIST 32G X 5 MM MISC Use as directed with Victozia.  Glory Rosebush VERIO test strip Check blood glucose once daily  .  Pancrelipase, Lip-Prot-Amyl, (PANCREAZE PO) Take by mouth. Takes one tablet before and after each meal.  . spironolactone (ALDACTONE) 25 MG tablet Take 0.5 tablets (12.5 mg total) by mouth daily.  . traMADol (ULTRAM) 50 MG tablet Take 50 mg by mouth every 6 (six) hours as needed for moderate pain (pt takes qhs and PRN).      Allergies:   Macrobid [nitrofurantoin]   Social History   Tobacco Use  . Smoking status: Never Smoker  . Smokeless tobacco: Never Used  Substance Use Topics  . Alcohol use: No  . Drug use: No     Family Hx: The patient's family history includes Atrial fibrillation in her maternal grandmother and mother; Diabetes Mellitus II in her brother and  father; Hypertension in her brother, father, maternal grandmother, and mother; Kidney disease in her father; Pulmonary fibrosis in her mother; Stroke in her maternal grandmother.  ROS:   Please see the history of present illness.     All other systems reviewed and are negative.   Labs/Other Tests and Data Reviewed:    Recent Labs: 05/19/2019: ALT 36 12/16/2019: BUN 21; Creatinine, Ser 1.35; Potassium 4.6; Sodium 139   Recent Lipid Panel Lab Results  Component Value Date/Time   CHOL 158 05/19/2019 01:29 PM   TRIG 110 05/19/2019 01:29 PM   HDL 53 05/19/2019 01:29 PM   CHOLHDL 3.0 05/19/2019 01:29 PM   LDLCALC 83 05/19/2019 01:29 PM    Wt Readings from Last 3 Encounters:  02/12/20 180 lb (81.6 kg)  12/31/19 180 lb (81.6 kg)  12/04/19 180 lb (81.6 kg)     Objective:    Vital Signs:  Pulse 67   Ht 5\' 6"  (1.676 m)   Wt 180 lb (81.6 kg)   BMI 29.05 kg/m     ASSESSMENT & PLAN:    1.  OSA -  The patient is tolerating PAP therapy well without any problems. The PAP download was reviewed today and showed an AHI of 2.2/hr on auto PAP  with 53% compliance in using more than 4 hours nightly. In review of her data, her compliance was actually 100% for the days she used it.  She did not use her device for 7 days due  an acute GI illness but otherwise uses it nightly for over 4 hours and 35 minutes.   The patient has been using and benefiting from PAP use and will continue to benefit from therapy.   2.  HTN -continue Spiro 25mg , Carvedilol 25mg  BID and Entresto 49-51mg  BID  COVID-19 Education: The signs and symptoms of COVID-19 were discussed with the patient and how to seek care for testing (follow up with PCP or arrange E-visit).  The importance of social distancing was discussed today.  Patient Risk:   After full review of this patient's clinical status, I feel that they are at least moderate risk at this time.  Time:   Today, I have spent 20 minutes on telemedicine discussing medical problems including OSA and HTN and reviewing patient's chart including PAP compliance download.  Medication Adjustments/Labs and Tests Ordered: Current medicines are reviewed at length with the patient today.  Concerns regarding medicines are outlined above.  Tests Ordered: No orders of the defined types were placed in this encounter.  Medication Changes: No orders of the defined types were placed in this encounter.   Disposition:  Follow up in 6 month(s)  Signed, , MD  02/12/2020 11:53 AM    [facility]

## 2020-02-12 NOTE — Patient Instructions (Signed)
Medication Instructions:  Your physician recommends that you continue on your current medications as directed. Please refer to the Current Medication list given to you today.  *If you need a refill on your cardiac medications before your next appointment, please call your pharmacy*   Follow-Up: At [facility], you and your health needs are our priority.  As part of our continuing mission to provide you with exceptional heart care, we have created designated Provider Care Teams.  These Care Teams include your primary Cardiologist (physician) and Advanced Practice Providers (APPs -  Physician Assistants and Nurse Practitioners) who all work together to provide you with the care you need, when you need it.  We recommend signing up for the patient portal called "MyChart".  Sign up information is provided on this After Visit Summary.  MyChart is used to connect with patients for Virtual Visits (Telemedicine).  Patients are able to view lab/test results, encounter notes, upcoming appointments, etc.  Non-urgent messages can be sent to your provider as well.   To learn more about what you can do with MyChart, go to [email or web address].    Your next appointment:   6 month(s)  The format for your next appointment:   Virtual Visit   Provider:   You may see Armanda Magic, MD or one of the following Advanced Practice Providers on your designated Care Team:    Ronie Spies, PA-C  Jacolyn Reedy, PA-C

## 2020-02-19 ENCOUNTER — Other Ambulatory Visit (HOSPITAL_COMMUNITY): Payer: Self-pay | Admitting: Cardiology

## 2020-02-23 ENCOUNTER — Telehealth: Payer: Managed Care, Other (non HMO) | Admitting: Cardiology

## 2020-03-30 ENCOUNTER — Other Ambulatory Visit (HOSPITAL_COMMUNITY): Payer: Self-pay | Admitting: Cardiology

## 2020-04-20 ENCOUNTER — Other Ambulatory Visit (HOSPITAL_COMMUNITY): Payer: Self-pay | Admitting: Cardiology

## 2020-04-22 ENCOUNTER — Other Ambulatory Visit (HOSPITAL_COMMUNITY): Payer: Self-pay | Admitting: Cardiology

## 2020-04-28 ENCOUNTER — Other Ambulatory Visit (HOSPITAL_COMMUNITY): Payer: Self-pay | Admitting: Cardiology

## 2020-04-30 ENCOUNTER — Other Ambulatory Visit (HOSPITAL_COMMUNITY): Payer: Self-pay | Admitting: Cardiology

## 2020-05-03 ENCOUNTER — Other Ambulatory Visit (HOSPITAL_COMMUNITY): Payer: Self-pay | Admitting: Cardiology

## 2020-05-03 ENCOUNTER — Other Ambulatory Visit (HOSPITAL_COMMUNITY): Payer: Self-pay | Admitting: *Deleted

## 2020-05-10 ENCOUNTER — Other Ambulatory Visit (HOSPITAL_COMMUNITY): Payer: Self-pay | Admitting: Cardiology

## 2020-06-02 ENCOUNTER — Ambulatory Visit (HOSPITAL_COMMUNITY)
Admission: RE | Admit: 2020-06-02 | Discharge: 2020-06-02 | Disposition: A | Discharge status: Home / Self Care | Payer: Managed Care, Other (non HMO) | Source: Ambulatory Visit | Attending: Cardiology | Admitting: Cardiology

## 2020-06-02 ENCOUNTER — Ambulatory Visit (HOSPITAL_BASED_OUTPATIENT_CLINIC_OR_DEPARTMENT_OTHER)
Admission: RE | Admit: 2020-06-02 | Discharge: 2020-06-02 | Disposition: A | Discharge status: Home / Self Care | Payer: Managed Care, Other (non HMO) | Source: Ambulatory Visit | Attending: Cardiology | Admitting: Cardiology

## 2020-06-02 ENCOUNTER — Other Ambulatory Visit: Payer: Self-pay

## 2020-06-02 ENCOUNTER — Encounter (HOSPITAL_COMMUNITY): Payer: Self-pay | Admitting: Cardiology

## 2020-06-02 VITALS — BP 98/60 | HR 60 | Ht 66.0 in | Wt 184.4 lb

## 2020-06-02 DIAGNOSIS — M797 Fibromyalgia: Secondary | ICD-10-CM | POA: Diagnosis not present

## 2020-06-02 DIAGNOSIS — I5022 Chronic systolic (congestive) heart failure: Secondary | ICD-10-CM | POA: Diagnosis not present

## 2020-06-02 DIAGNOSIS — G4733 Obstructive sleep apnea (adult) (pediatric): Secondary | ICD-10-CM | POA: Insufficient documentation

## 2020-06-02 DIAGNOSIS — I272 Pulmonary hypertension, unspecified: Secondary | ICD-10-CM | POA: Diagnosis not present

## 2020-06-02 DIAGNOSIS — E039 Hypothyroidism, unspecified: Secondary | ICD-10-CM | POA: Insufficient documentation

## 2020-06-02 DIAGNOSIS — Z8249 Family history of ischemic heart disease and other diseases of the circulatory system: Secondary | ICD-10-CM | POA: Insufficient documentation

## 2020-06-02 DIAGNOSIS — E785 Hyperlipidemia, unspecified: Secondary | ICD-10-CM | POA: Insufficient documentation

## 2020-06-02 DIAGNOSIS — F329 Major depressive disorder, single episode, unspecified: Secondary | ICD-10-CM | POA: Insufficient documentation

## 2020-06-02 DIAGNOSIS — Z7982 Long term (current) use of aspirin: Secondary | ICD-10-CM | POA: Diagnosis not present

## 2020-06-02 DIAGNOSIS — Z833 Family history of diabetes mellitus: Secondary | ICD-10-CM | POA: Diagnosis not present

## 2020-06-02 DIAGNOSIS — Z881 Allergy status to other antibiotic agents status: Secondary | ICD-10-CM | POA: Insufficient documentation

## 2020-06-02 DIAGNOSIS — Z79899 Other long term (current) drug therapy: Secondary | ICD-10-CM | POA: Insufficient documentation

## 2020-06-02 DIAGNOSIS — E119 Type 2 diabetes mellitus without complications: Secondary | ICD-10-CM | POA: Diagnosis not present

## 2020-06-02 DIAGNOSIS — Z794 Long term (current) use of insulin: Secondary | ICD-10-CM | POA: Insufficient documentation

## 2020-06-02 DIAGNOSIS — I110 Hypertensive heart disease with heart failure: Secondary | ICD-10-CM | POA: Insufficient documentation

## 2020-06-02 DIAGNOSIS — I428 Other cardiomyopathies: Secondary | ICD-10-CM | POA: Insufficient documentation

## 2020-06-02 LAB — BASIC METABOLIC PANEL
Anion gap: 15 (ref 5–15)
BUN: 27 mg/dL — ABNORMAL HIGH (ref 6–20)
CO2: 23 mmol/L (ref 22–32)
Calcium: 9.5 mg/dL (ref 8.9–10.3)
Chloride: 96 mmol/L — ABNORMAL LOW (ref 98–111)
Creatinine, Ser: 1.38 mg/dL — ABNORMAL HIGH (ref 0.44–1.00)
GFR calc Af Amer: 49 mL/min — ABNORMAL LOW (ref >60)
GFR calc non Af Amer: 43 mL/min — ABNORMAL LOW (ref >60)
Glucose, Bld: 180 mg/dL — ABNORMAL HIGH (ref 70–99)
Potassium: 5.3 mmol/L — ABNORMAL HIGH (ref 3.5–5.1)
Sodium: 134 mmol/L — ABNORMAL LOW (ref 135–145)

## 2020-06-02 LAB — ECHOCARDIOGRAM COMPLETE
Area-P 1/2: 2.6 cm2
S' Lateral: 3.1 cm

## 2020-06-02 LAB — LIPID PANEL
Cholesterol: 165 mg/dL (ref 0–200)
HDL: 51 mg/dL (ref >40)
LDL Cholesterol: 92 mg/dL (ref 0–99)
Total CHOL/HDL Ratio: 3.2 RATIO
Triglycerides: 111 mg/dL (ref <150)
VLDL: 22 mg/dL (ref 0–40)

## 2020-06-02 NOTE — Progress Notes (Signed)
Date:  06/02/2020   ID:  Robin Owen, DOB October 21, 1961, MRN [id number]   Provider location: [facility] Type of Visit: Established patient  PCP:  Hali Marry, MD  Cardiologist:  Dr. Shirlee Latch  Chief Complaint: Shortness of breath   History of Present Illness: Robin Owen is a 59 y.o. female who has a past medical history of HTN, HLD, DMII, hypothyroidism and fibromyalgia (on disability), and CHF.   Admitted 11/16 through 09/17/15 with increased dyspnea. CXR with pulmonary edema. This prompted and ECHO that showed reduced EF 20%. RHC/LHC showed normal coronaries and reduced cardiac index. Cardiac MRI showed some mid-wall late gadolinium enhancement in the septum.  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 She has been lisinopril for some time. Started on coreg and lasix.  Discharge weight was 163 pounds.   She developed AKI and hyperkalemia in the setting of high dose Ibuprofen.  I asked her to stop this and started her on tramadol as needed instead.    Echo in 11/18 showed increase in EF to 50-55%.      Echo 10/24/18 LVEF 45-50%, Grade 1 DD, Mild MR, PA peak pressure 24 mm Hg. Echo was done today and reviewed, EF 50-55%, basal inferior hypokinesis, normal RV.   She returns today for followup of CHF.  She is using CPAP for OSA.  Still working on blood glucose control.  Weight is down 3 lbs.  She is short of breath walking up up stairs.  No dyspnea walking on flat ground.  No chest pain.  No orthopnea/PND.    ECG (personally reviewed): NSR, poor RWP.   Test/Procedures  09/15/2015 ECHO EF 20% MV mild regurgitation TV mild regurgitation  09/19/2015 CMRI 1. Severely dilated left ventricle with normal wall thickness and severe systolic dysfunction (LVEF 20-25%) with global hypokinesis and akinesis of the entire septal wall and diffuse mid wall late gadolinium enhancement. 2. Moderate RV dilatation with mild moderate systolic dysfunction. 3. Moderate left and  right atrial dilatation. 4. Mild to moderate mitral and mild tricuspid regurgitation. 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  RHC/LHC 09/16/2015  RA 7 PCWP 29 CO 3.11 CI 1.7  Normal Cors  Echo (5/17) with EF 35-40%.   Echo (12/17) with EF 35-40%, mild LV dilation.   CPX (12/17) with RER 1.13, peak VO2 20.8, VE/VCO2 slope 30.  Echo (11/18): EF 50-55%.   Echo (12/19): LVEF 45-50%, Grade 1 DD, Mild MR, PA peak pressure 24 mm Hg  Echo (8/21): EF 50-55%, basal inferior hypokinesis, normal RV size and systolic function.   Labs 09/15/2015: BNP 2354 Labs 09/16/2015: TSH 1.99 Labs 09/17/2015: K 3.8 Creatinine 0.91 Labs 09/29/2015: K 4.5 Creatinine 1.09 Labs 10/05/2015: K 5.3 Creatinine 1.21, LDL 74, HCT 37.2 Labs 1/17: SPEP negative Labs 2/17: K 3.9, creatinine 1.34, digoxin 0.7, hemoglobin 12.5 Labs 3/17: K 3.9, creatinine 1.25, hemoglobin 10 Labs 4/17: K 4.2, creatinine 1.15 Labs 5/17: K 3.8, creatinine 1.11, HCT 31.7 Labs 6/17: K 5.1, creatinine 0.98, BNP 45 Labs 12/17: K 5, creatinine 1.17, BNP 35 Labs 3/18: K 3.9, creatinine 1.07 Labs 8/18: K 4.3, creatinine 0.94 Labs 2/19: K 5.7, creatinine 1.39, Na 125, hgb 12.6, LDL 102 Labs 5/19: K 4.7, creatinine 1.12 Labs 12/19: K 4.1, creatinine 1.09 Labs 5/20: creatinine 1.13 Labs 7/20: LDL 83, HDL 53, LFTs normal Labs 9/20: K 4.6, creatinine 1.2 Labs 2/21: K 4.6, creatinine 1.35  Past Medical History:  Diagnosis Date  . Anemia   .  CHF (congestive heart failure) (HCC)   . Depression   . Diabetes mellitus without complication (HCC)   . Fibromyalgia   . Hyperlipidemia   . Hypertension   . Hypothyroidism      Current Outpatient Medications  Medication Sig Dispense Refill  . acetaminophen (TYLENOL) 325 MG tablet Take 650 mg by mouth every 6 (six) hours as needed for mild pain.    Marland Kitchen aspirin 81 MG EC tablet Adult Low Dose Aspirin 81 mg  tablet,delayed release  Take 1 tablet every day by oral route.    Marland Kitchen atorvastatin (LIPITOR) 20 MG tablet Take 20 mg by mouth daily.    . carvedilol (COREG) 25 MG tablet TAKE 1 TABLET BY MOUTH TWICE A DAY WITH MEALS 180 tablet 3  . Cholecalciferol (VITAMIN D-1000 MAX ST) 1000 units tablet Take 1,000 Units by mouth daily.    . Continuous Blood Gluc Receiver (FREESTYLE LIBRE 2 READER) DEVI Scan as directed    . Continuous Blood Gluc Sensor (FREESTYLE LIBRE 2 SENSOR) MISC Apply sensor as directed every 14 days    . Continuous Blood Gluc Sensor (FREESTYLE LIBRE 2 SENSOR) MISC APPLY SENSOR AS DIRECTED EVERY 14 DAYS    . CORLANOR 5 MG TABS tablet TAKE 1 TABLET BY MOUTH TWICE A DAY WITH A MEAL 60 tablet 11  . cyclobenzaprine (FLEXERIL) 10 MG tablet Take 10 mg by mouth 2 (two) times daily as needed for muscle spasms (pt takes qhs and PRN).     Marland Kitchen ENTRESTO 49-51 MG TAKE 1 TABLET BY MOUTH TWICE A DAY 180 tablet 3  . famotidine (PEPCID) 40 MG tablet Take 40 mg by mouth at bedtime.    Marland Kitchen FLUoxetine (PROZAC) 20 MG tablet Take 20 mg by mouth daily.    . furosemide (LASIX) 40 MG tablet TAKE 1 TABLET BY MOUTH TWICE A DAY 180 tablet 3  . gabapentin (NEURONTIN) 600 MG tablet Take 300-600 mg by mouth 3 (three) times daily as needed (for fibromyalgia). Take 1/2 tablet (300mg ) by mouth in the morning, and afternonn, and 1 tablet (600mg ) at bedtime    . glimepiride (AMARYL) 2 MG tablet Take 1 tablet twice a day as needed for glucose above 100    . insulin degludec (TRESIBA) 100 UNIT/ML SOPN FlexTouch Pen Inject 26 Units into the skin at bedtime.     . Iron-FA-B Cmp-C-Biot-Probiotic (FUSION PLUS PO) Take 1 capsule by mouth daily.    levothyroxine (SYNTHROID, LEVOTHROID) 88 MCG tablet Take 88 mcg by mouth daily before breakfast.    . Liraglutide (VICTOZA) 18 MG/3ML SOPN Inject 1.8 mg into the skin daily.    . metFORMIN (GLUCOPHAGE) 1000 MG tablet Take 1 tablet (1,000 mg total) by mouth 2 (two) times daily with a meal.     . Multiple Vitamin (MULTIVITAMIN WITH MINERALS) TABS tablet Take 1 tablet by mouth daily.    NOVOTWIST 32G X 5 MM MISC Use as directed with Victozia.  2  . ONETOUCH VERIO test strip Check blood glucose once daily  2  . spironolactone (ALDACTONE) 25 MG tablet TAKE 1 TABLET BY MOUTH EVERY DAY 90 tablet 2  . traMADol (ULTRAM) 50 MG tablet Take 50 mg by mouth every 6 (six) hours as needed for moderate pain (pt takes qhs and PRN).     . Urine Glucose-Ketones Test (KETO-DIASTIX) STRP 1 strip by Misc.(Non-Drug; Combo Route) route 2 (two) times daily as needed. Use to check urine for ketones with high blood glucose    .  Wheat Dextrin (BENEFIBER) POWD Take by mouth.     No current facility-administered medications for this encounter.    Allergies:   Macrobid [nitrofurantoin]   Social History:  The patient  reports that she has never smoked. She has never used smokeless tobacco. She reports that she does not drink alcohol and does not use drugs.   Family History:  The patient's family history includes Atrial fibrillation in her maternal grandmother and mother; Diabetes Mellitus II in her brother and father; Hypertension in her brother, father, maternal grandmother, and mother; Kidney disease in her father; Pulmonary fibrosis in her mother; Stroke in her maternal grandmother.   ROS:  Please see the history of present illness.   All other systems are personally reviewed and negative.   Exam:   BP 98/60   Pulse 60   Ht 5\' 6"  (1.676 m)   Wt 83.6 kg (184 lb 6.4 oz)   SpO2 97%   BMI 29.76 kg/m  General: NAD Neck: No JVD, no thyromegaly or thyroid nodule.  Lungs: Clear to auscultation bilaterally with normal respiratory effort. CV: Nondisplaced PMI.  Heart regular S1/S2, no S3/S4, no murmur.  No peripheral edema.  No carotid bruit.  Normal pedal pulses.  Abdomen: Soft, nontender, no hepatosplenomegaly, no distention.  Skin: Intact without lesions or rashes.  Neurologic: Alert and oriented x 3.   Psych: Normal affect. Extremities: No clubbing or cyanosis.  HEENT: Normal.   Recent Labs: 06/02/2020: BUN 27; Creatinine, Ser 1.38; Potassium 5.3; Sodium 134  Personally reviewed   Wt Readings from Last 3 Encounters:  06/02/20 83.6 kg (184 lb 6.4 oz)  02/12/20 81.6 kg (180 lb)  12/31/19 81.6 kg (180 lb)      ASSESSMENT AND PLAN:  1. Chronic systolic CHF: 09/15/2015 ECHO EF 20%. Had cMRI EF 20-25%, RV mod dilated.  cMRI (11/16) showed an area of mid-wall septal late gadolinium enhancement. NICM perhaps related to HTN versus viral myocarditis. SPEP negative.  09/16/2015 RHC/LHC coronaries ok, low cardiac index 1.7.  Repeat echo in 5/17 and again in 12/17 showed some improvement, EF 35-40%.  CPX in 12/17 showed low normal peak VO2 with no clear cardiopulmonary limitation. Echo in 11/18 showed EF up to 50-55%, echo in 12/19 showed EF 45-50%, echo today showed EF 50-55%.   NYHA class II symptoms chronically. She is not volume overloaded on exam but does not feel that she can decrease her Lasix dose.    - Continue lasix 40 mg BID.  BMET today.  - Continue spironolactone 25 mg daily.   - Continue Coreg 25 mg BID - Continue Entresto 49/51 mg BID, no BP room to increase.    - Continue ivabradine 5 mg BID - EF out of range for ICD.   - She will followup in 6 months 2. Fibromyalgia: Disabled since 1996. No change.  3. Diabetes: Off empagliflozin due to recurrent yeast infection.  4. OSA: Now using CPAP.  5. Hyperlipidemia: Check lipids today.  Goal LDL < 100, ideally < 70 with diabetes.   Signed, 1997, MD  06/02/2020  [facility] [address] [facility] [place] [phone] (office) [phone] (fax)

## 2020-06-02 NOTE — Progress Notes (Signed)
  Echocardiogram 2D Echocardiogram has been performed.  Delcie Roch 06/02/2020, 10:57 AM

## 2020-06-02 NOTE — Patient Instructions (Signed)
Labs done today, your results will be available in MyChart, we will contact you for abnormal readings.  Please call our office in January to schedule your follow up appointment  If you have any questions or concerns before your next appointment please send us a message through mychart or call our office at [phone].    TO LEAVE A MESSAGE FOR THE NURSE SELECT OPTION 2, PLEASE LEAVE A MESSAGE INCLUDING: . YOUR NAME . DATE OF BIRTH . CALL BACK NUMBER . REASON FOR CALL**this is important as we prioritize the call backs  YOU WILL RECEIVE A CALL BACK THE SAME DAY AS LONG AS YOU CALL BEFORE 4:00 PM  At the [facility], you and your health needs are our priority. As part of our continuing mission to provide you with exceptional heart care, we have created designated Provider Care Teams. These Care Teams include your primary Cardiologist (physician) and Advanced Practice Providers (APPs- Physician Assistants and Nurse Practitioners) who all work together to provide you with the care you need, when you need it.   You may see any of the following providers on your designated Care Team at your next follow up: . Dr Daniel Bensimhon . Dr Dalton McLean . Amy Clegg, NP . Brittainy Simmons, PA . Lauren Kemp, PharmD   Please be sure to bring in all your medications bottles to every appointment.    

## 2020-06-06 ENCOUNTER — Telehealth (HOSPITAL_COMMUNITY): Payer: Self-pay

## 2020-06-06 DIAGNOSIS — I5022 Chronic systolic (congestive) heart failure: Secondary | ICD-10-CM

## 2020-06-06 DIAGNOSIS — I272 Pulmonary hypertension, unspecified: Secondary | ICD-10-CM

## 2020-06-06 NOTE — Telephone Encounter (Signed)
Patient advised and verbalized understanding. Pt denies taking any k supplements. Lab appt scheduled/lab order entered  Orders Placed This Encounter  Procedures  . Basic Metabolic Panel (BMET)    Standing Status:   Future    Standing Expiration Date:   06/06/2021    Order Specific Question:   Release to patient    Answer:   Immediate

## 2020-06-06 NOTE — Telephone Encounter (Signed)
-----   Message from Laurey Morale, MD sent at 06/02/2020 10:25 PM EDT ----- Low K diet. Make sure no K supplement.  BMET 2 wks.

## 2020-06-16 ENCOUNTER — Ambulatory Visit (HOSPITAL_COMMUNITY)
Admission: RE | Admit: 2020-06-16 | Discharge: 2020-06-16 | Disposition: A | Discharge status: Home / Self Care | Payer: Managed Care, Other (non HMO) | Source: Ambulatory Visit | Attending: Internal Medicine | Admitting: Internal Medicine

## 2020-06-16 ENCOUNTER — Other Ambulatory Visit: Payer: Self-pay

## 2020-06-16 DIAGNOSIS — I272 Pulmonary hypertension, unspecified: Secondary | ICD-10-CM | POA: Diagnosis present

## 2020-06-16 DIAGNOSIS — I5022 Chronic systolic (congestive) heart failure: Secondary | ICD-10-CM | POA: Insufficient documentation

## 2020-06-16 LAB — BASIC METABOLIC PANEL
Anion gap: 9 (ref 5–15)
BUN: 24 mg/dL — ABNORMAL HIGH (ref 6–20)
CO2: 26 mmol/L (ref 22–32)
Calcium: 8.9 mg/dL (ref 8.9–10.3)
Chloride: 98 mmol/L (ref 98–111)
Creatinine, Ser: 1.47 mg/dL — ABNORMAL HIGH (ref 0.44–1.00)
GFR calc Af Amer: 46 mL/min — ABNORMAL LOW (ref >60)
GFR calc non Af Amer: 39 mL/min — ABNORMAL LOW (ref >60)
Glucose, Bld: 242 mg/dL — ABNORMAL HIGH (ref 70–99)
Potassium: 5.5 mmol/L — ABNORMAL HIGH (ref 3.5–5.1)
Sodium: 133 mmol/L — ABNORMAL LOW (ref 135–145)

## 2020-06-17 ENCOUNTER — Telehealth (HOSPITAL_COMMUNITY): Payer: Self-pay

## 2020-06-17 DIAGNOSIS — I5022 Chronic systolic (congestive) heart failure: Secondary | ICD-10-CM

## 2020-06-17 NOTE — Telephone Encounter (Signed)
Patient advised and verbalized understanding. Lab appt scheduled,lab order entered  Orders Placed This Encounter  Procedures  . Basic Metabolic Panel (BMET)    Standing Status:   Future    Standing Expiration Date:   06/17/2021    Order Specific Question:   Release to patient    Answer:   Immediate

## 2020-06-17 NOTE — Telephone Encounter (Signed)
-----   Message from Laurey Morale, MD sent at 06/16/2020  4:04 PM EDT ----- K is higher, very low K diet and repeat BMET next week.

## 2020-06-22 ENCOUNTER — Other Ambulatory Visit: Payer: Self-pay

## 2020-06-22 ENCOUNTER — Ambulatory Visit (HOSPITAL_COMMUNITY)
Admission: RE | Admit: 2020-06-22 | Discharge: 2020-06-22 | Disposition: A | Discharge status: Home / Self Care | Payer: Managed Care, Other (non HMO) | Source: Ambulatory Visit | Attending: Internal Medicine | Admitting: Internal Medicine

## 2020-06-22 DIAGNOSIS — I5022 Chronic systolic (congestive) heart failure: Secondary | ICD-10-CM | POA: Insufficient documentation

## 2020-06-22 LAB — BASIC METABOLIC PANEL
Anion gap: 12 (ref 5–15)
BUN: 27 mg/dL — ABNORMAL HIGH (ref 6–20)
CO2: 26 mmol/L (ref 22–32)
Calcium: 9.6 mg/dL (ref 8.9–10.3)
Chloride: 97 mmol/L — ABNORMAL LOW (ref 98–111)
Creatinine, Ser: 1.39 mg/dL — ABNORMAL HIGH (ref 0.44–1.00)
GFR calc Af Amer: 49 mL/min — ABNORMAL LOW (ref >60)
GFR calc non Af Amer: 42 mL/min — ABNORMAL LOW (ref >60)
Glucose, Bld: 87 mg/dL (ref 70–99)
Potassium: 4.6 mmol/L (ref 3.5–5.1)
Sodium: 135 mmol/L (ref 135–145)

## 2020-09-17 ENCOUNTER — Other Ambulatory Visit (HOSPITAL_COMMUNITY): Payer: Self-pay | Admitting: Cardiology

## 2020-09-17 DIAGNOSIS — I5022 Chronic systolic (congestive) heart failure: Secondary | ICD-10-CM

## 2020-09-30 ENCOUNTER — Other Ambulatory Visit (HOSPITAL_COMMUNITY): Payer: Self-pay

## 2020-09-30 MED ORDER — FUROSEMIDE 40 MG PO TABS: 40.0000 mg | ORAL_TABLET | Freq: Two times a day (BID) | ORAL | 3 refills | Status: DC

## 2021-01-11 ENCOUNTER — Other Ambulatory Visit (HOSPITAL_COMMUNITY): Payer: Self-pay | Admitting: Cardiology

## 2021-02-07 ENCOUNTER — Encounter (HOSPITAL_COMMUNITY): Payer: Self-pay | Admitting: Cardiology

## 2021-02-07 ENCOUNTER — Ambulatory Visit (HOSPITAL_COMMUNITY)
Admission: RE | Admit: 2021-02-07 | Discharge: 2021-02-07 | Disposition: A | Discharge status: Home / Self Care | Payer: Managed Care, Other (non HMO) | Source: Ambulatory Visit | Attending: Cardiology | Admitting: Cardiology

## 2021-02-07 ENCOUNTER — Other Ambulatory Visit: Payer: Self-pay

## 2021-02-07 VITALS — BP 100/60 | HR 60 | Wt 181.4 lb

## 2021-02-07 DIAGNOSIS — I110 Hypertensive heart disease with heart failure: Secondary | ICD-10-CM | POA: Diagnosis present

## 2021-02-07 DIAGNOSIS — Z79890 Hormone replacement therapy (postmenopausal): Secondary | ICD-10-CM | POA: Diagnosis not present

## 2021-02-07 DIAGNOSIS — Z8742 Personal history of other diseases of the female genital tract: Secondary | ICD-10-CM | POA: Diagnosis not present

## 2021-02-07 DIAGNOSIS — G4733 Obstructive sleep apnea (adult) (pediatric): Secondary | ICD-10-CM | POA: Insufficient documentation

## 2021-02-07 DIAGNOSIS — Z7984 Long term (current) use of oral hypoglycemic drugs: Secondary | ICD-10-CM | POA: Insufficient documentation

## 2021-02-07 DIAGNOSIS — Z9989 Dependence on other enabling machines and devices: Secondary | ICD-10-CM | POA: Insufficient documentation

## 2021-02-07 DIAGNOSIS — E039 Hypothyroidism, unspecified: Secondary | ICD-10-CM | POA: Diagnosis not present

## 2021-02-07 DIAGNOSIS — E119 Type 2 diabetes mellitus without complications: Secondary | ICD-10-CM | POA: Insufficient documentation

## 2021-02-07 DIAGNOSIS — Z8249 Family history of ischemic heart disease and other diseases of the circulatory system: Secondary | ICD-10-CM | POA: Diagnosis not present

## 2021-02-07 DIAGNOSIS — Z79899 Other long term (current) drug therapy: Secondary | ICD-10-CM | POA: Diagnosis not present

## 2021-02-07 DIAGNOSIS — Z794 Long term (current) use of insulin: Secondary | ICD-10-CM | POA: Insufficient documentation

## 2021-02-07 DIAGNOSIS — E785 Hyperlipidemia, unspecified: Secondary | ICD-10-CM | POA: Insufficient documentation

## 2021-02-07 DIAGNOSIS — Z881 Allergy status to other antibiotic agents status: Secondary | ICD-10-CM | POA: Insufficient documentation

## 2021-02-07 DIAGNOSIS — M797 Fibromyalgia: Secondary | ICD-10-CM | POA: Diagnosis not present

## 2021-02-07 DIAGNOSIS — Z7982 Long term (current) use of aspirin: Secondary | ICD-10-CM | POA: Insufficient documentation

## 2021-02-07 DIAGNOSIS — I5022 Chronic systolic (congestive) heart failure: Secondary | ICD-10-CM | POA: Diagnosis not present

## 2021-02-07 LAB — BASIC METABOLIC PANEL
Anion gap: 10 (ref 5–15)
BUN: 14 mg/dL (ref 6–20)
CO2: 29 mmol/L (ref 22–32)
Calcium: 9.4 mg/dL (ref 8.9–10.3)
Chloride: 100 mmol/L (ref 98–111)
Creatinine, Ser: 1.02 mg/dL — ABNORMAL HIGH (ref 0.44–1.00)
GFR, Estimated: >60 mL/min (ref >60)
Glucose, Bld: 92 mg/dL (ref 70–99)
Potassium: 4.1 mmol/L (ref 3.5–5.1)
Sodium: 139 mmol/L (ref 135–145)

## 2021-02-07 NOTE — Patient Instructions (Signed)
Labs done today, your results will be available in MyChart, we will contact you for abnormal readings.  Please call our office in September to schedule your follow up appointment and echocardiogram  If you have any questions or concerns before your next appointment please send [place] a message through [place] or call our office at [phone].    TO LEAVE A MESSAGE FOR THE NURSE SELECT OPTION 2, PLEASE LEAVE A MESSAGE INCLUDING: . YOUR NAME . DATE OF BIRTH . CALL BACK NUMBER . REASON FOR CALL**this is important as we prioritize the call backs  YOU WILL RECEIVE A CALL BACK THE SAME DAY AS LONG AS YOU CALL BEFORE 4:00 PM  At the [facility], you and your health needs are our priority. As part of our continuing mission to provide you with exceptional heart care, we have created designated Provider Care Teams. These Care Teams include your primary Cardiologist (physician) and Advanced Practice Providers (APPs- Physician Assistants and Nurse Practitioners) who all work together to provide you with the care you need, when you need it.   You may see any of the following providers on your designated Care Team at your next follow up: Marland Kitchen Dr Arvilla Meres . Dr Marca Ancona . Dr Thornell Mule . Tonye Becket, NP . Robbie Lis, PA . Shanda Bumps [place],NP . Karle Plumber, PharmD   Please be sure to bring in all your medications bottles to every appointment.

## 2021-02-08 NOTE — Progress Notes (Signed)
Date:  02/08/2021   ID:  Robin Owen, DOB September 20, 1961, MRN [id number]   Provider location: [facility] Type of Visit: Established patient  PCP:  Robin Marry, MD  Cardiologist:  Dr. Shirlee Owen  Chief Complaint: Shortness of breath   History of Present Illness: Robin Owen is a 60 y.o. female who has a past medical history of HTN, HLD, DMII, hypothyroidism and fibromyalgia (on disability), and CHF.   Admitted 11/16 through 09/17/15 with increased dyspnea. CXR with pulmonary edema. This prompted and ECHO that showed reduced EF 20%. RHC/LHC showed normal coronaries and reduced cardiac index. Cardiac MRI showed some mid-wall late gadolinium enhancement in the septum.  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 She has been lisinopril for some time. Started on coreg and lasix.  Discharge weight was 163 pounds.   She developed AKI and hyperkalemia in the setting of high dose Ibuprofen.  I asked her to stop this and started her on tramadol as needed instead.    Echo in 11/18 showed increase in EF to 50-55%.      Echo 10/24/18 LVEF 45-50%, Grade 1 DD, Mild MR, PA peak pressure 24 mm Hg. Echo was done today and reviewed, EF 50-55%, basal inferior hypokinesis, normal RV.   She returns today for followup of CHF.  She is using CPAP for OSA.  Weight is down 3 lbs.  Still short of breath walking up hills or stairs.  No dyspnea on flat ground.  No chest pain.  No orthopnea/PND.   ECG (personally reviewed): NSR, poor RWP  Test/Procedures  09/15/2015 ECHO EF 20% MV mild regurgitation TV mild regurgitation  09/19/2015 CMRI 1. Severely dilated left ventricle with normal wall thickness and severe systolic dysfunction (LVEF 20-25%) with global hypokinesis and akinesis of the entire septal wall and diffuse mid wall late gadolinium enhancement. 2. Moderate RV dilatation with mild moderate systolic dysfunction. 3. Moderate left and right atrial dilatation. 4. Mild to moderate  mitral and mild tricuspid regurgitation. 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  RHC/LHC 09/16/2015  RA 7 PCWP 29 CO 3.11 CI 1.7  Normal Cors  Echo (5/17) with EF 35-40%.   Echo (12/17) with EF 35-40%, mild LV dilation.   CPX (12/17) with RER 1.13, peak VO2 20.8, VE/VCO2 slope 30.  Echo (11/18): EF 50-55%.   Echo (12/19): LVEF 45-50%, Grade 1 DD, Mild MR, PA peak pressure 24 mm Hg  Echo (8/21): EF 50-55%, basal inferior hypokinesis, normal RV size and systolic function.   Labs 09/15/2015: BNP 2354 Labs 09/16/2015: TSH 1.99 Labs 09/17/2015: K 3.8 Creatinine 0.91 Labs 09/29/2015: K 4.5 Creatinine 1.09 Labs 10/05/2015: K 5.3 Creatinine 1.21, LDL 74, HCT 37.2 Labs 1/17: SPEP negative Labs 2/17: K 3.9, creatinine 1.34, digoxin 0.7, hemoglobin 12.5 Labs 3/17: K 3.9, creatinine 1.25, hemoglobin 10 Labs 4/17: K 4.2, creatinine 1.15 Labs 5/17: K 3.8, creatinine 1.11, HCT 31.7 Labs 6/17: K 5.1, creatinine 0.98, BNP 45 Labs 12/17: K 5, creatinine 1.17, BNP 35 Labs 3/18: K 3.9, creatinine 1.07 Labs 8/18: K 4.3, creatinine 0.94 Labs 2/19: K 5.7, creatinine 1.39, Na 125, hgb 12.6, LDL 102 Labs 5/19: K 4.7, creatinine 1.12 Labs 12/19: K 4.1, creatinine 1.09 Labs 5/20: creatinine 1.13 Labs 7/20: LDL 83, HDL 53, LFTs normal Labs 9/20: K 4.6, creatinine 1.2 Labs 2/21: K 4.6, creatinine 1.35 Labs 3/22: K 4.7, creatinine 1.18, LDL 88, HDL 45  Past Medical History:  Diagnosis Date  . Anemia   .  CHF (congestive heart failure) (HCC)   . Depression   . Diabetes mellitus without complication (HCC)   . Fibromyalgia   . Hyperlipidemia   . Hypertension   . Hypothyroidism      Current Outpatient Medications  Medication Sig Dispense Refill  . acetaminophen (TYLENOL) 325 MG tablet Take 650 mg by mouth every 6 (six) hours as needed for mild pain.    Marland Kitchen aspirin 81 MG EC tablet Adult Low Dose Aspirin 81 mg  tablet,delayed release  Take 1 tablet every day by oral route.    Marland Kitchen atorvastatin (LIPITOR) 20 MG tablet Take 20 mg by mouth daily.    . carvedilol (COREG) 25 MG tablet TAKE 1 TABLET BY MOUTH TWICE A DAY WITH MEALS 180 tablet 1  . Cholecalciferol 25 MCG (1000 UT) tablet Take 1,000 Units by mouth daily.    . Continuous Blood Gluc Sensor (FREESTYLE LIBRE 2 SENSOR) MISC APPLY SENSOR AS DIRECTED EVERY 14 DAYS    . CORLANOR 5 MG TABS tablet TAKE 1 TABLET BY MOUTH TWICE A DAY WITH MEALS 60 tablet 11  . cyclobenzaprine (FLEXERIL) 10 MG tablet Take 10 mg by mouth 2 (two) times daily as needed for muscle spasms (pt takes qhs and PRN).     Marland Kitchen ENTRESTO 49-51 MG TAKE 1 TABLET BY MOUTH TWICE A DAY 180 tablet 1  . famotidine (PEPCID) 40 MG tablet Take 40 mg by mouth at bedtime.    Marland Kitchen FLUoxetine (PROZAC) 20 MG tablet Take 20 mg by mouth daily.    . furosemide (LASIX) 40 MG tablet Take 1 tablet (40 mg total) by mouth 2 (two) times daily. 180 tablet 3  . gabapentin (NEURONTIN) 600 MG tablet Take 600 mg by mouth 2 (two) times daily.    Marland Kitchen glimepiride (AMARYL) 2 MG tablet Take 1 tablet twice a day as needed for glucose above 100    . insulin degludec (TRESIBA) 100 UNIT/ML SOPN FlexTouch Pen Inject 26 Units into the skin at bedtime.     . Iron-FA-B Cmp-C-Biot-Probiotic (FUSION PLUS PO) Take 1 capsule by mouth daily.    Marland Kitchen levothyroxine (SYNTHROID, LEVOTHROID) 88 MCG tablet Take 88 mcg by mouth daily before breakfast.    . liraglutide (VICTOZA) 18 MG/3ML SOPN Inject 1.8 mg into the skin daily.    . metFORMIN (GLUCOPHAGE) 1000 MG tablet Take 1 tablet (1,000 mg total) by mouth 2 (two) times daily with a meal.    . Multiple Vitamin (MULTIVITAMIN WITH MINERALS) TABS tablet Take 1 tablet by mouth daily.    Marland Kitchen NOVOTWIST 32G X 5 MM MISC Use as directed with Victozia.  2  . ONETOUCH VERIO test strip Check blood glucose once daily  2  . spironolactone (ALDACTONE) 25 MG tablet Take 12.5 mg by mouth daily.    . traMADol (ULTRAM)  50 MG tablet Take 50 mg by mouth every 6 (six) hours as needed for moderate pain (pt takes qhs and PRN).     . Urine Glucose-Ketones Test (KETO-DIASTIX) STRP 1 strip by Misc.(Non-Drug; Combo Route) route 2 (two) times daily as needed. Use to check urine for ketones with high blood glucose    . Wheat Dextrin (BENEFIBER) POWD Take by mouth.     No current facility-administered medications for this encounter.    Allergies:   Macrobid [nitrofurantoin]   Social History:  The patient  reports that she has never smoked. She has never used smokeless tobacco. She reports that she does not drink alcohol and does not use  drugs.   Family History:  The patient's family history includes Atrial fibrillation in her maternal grandmother and mother; Diabetes Mellitus II in her brother and father; Hypertension in her brother, father, maternal grandmother, and mother; Kidney disease in her father; Pulmonary fibrosis in her mother; Stroke in her maternal grandmother.   ROS:  Please see the history of present illness.   All other systems are personally reviewed and negative.   Exam:   BP 100/60   Pulse 60   Wt 82.3 kg (181 lb 6.4 oz)   SpO2 98%   BMI 29.28 kg/m  General: NAD Neck: No JVD, no thyromegaly or thyroid nodule.  Lungs: Clear to auscultation bilaterally with normal respiratory effort. CV: Nondisplaced PMI.  Heart regular S1/S2, no S3/S4, no murmur.  No peripheral edema.  No carotid bruit.  Normal pedal pulses.  Abdomen: Soft, nontender, no hepatosplenomegaly, no distention.  Skin: Intact without lesions or rashes.  Neurologic: Alert and oriented x 3.  Psych: Normal affect. Extremities: No clubbing or cyanosis.  HEENT: Normal.   Recent Labs: 02/07/2021: BUN 14; Creatinine, Ser 1.02; Potassium 4.1; Sodium 139  Personally reviewed   Wt Readings from Last 3 Encounters:  02/07/21 82.3 kg (181 lb 6.4 oz)  06/02/20 83.6 kg (184 lb 6.4 oz)  02/12/20 81.6 kg (180 lb)      ASSESSMENT AND  PLAN:  1. Chronic systolic CHF: 09/15/2015 ECHO EF 20%. Had cMRI EF 20-25%, RV mod dilated.  cMRI (11/16) showed an area of mid-wall septal late gadolinium enhancement. NICM perhaps related to HTN versus viral myocarditis. SPEP negative.  09/16/2015 RHC/LHC coronaries ok, low cardiac index 1.7.  Repeat echo in 5/17 and again in 12/17 showed some improvement, EF 35-40%.  CPX in 12/17 showed low normal peak VO2 with no clear cardiopulmonary limitation. Echo in 11/18 showed EF up to 50-55%, echo in 12/19 showed EF 45-50%, echo in 8/21 showed EF 50-55%.   NYHA class II symptoms chronically. She is not volume overloaded on exam but does not feel that she can decrease her Lasix dose.    - Continue lasix 40 mg BID.  BMET today.  - Continue spironolactone 25 mg daily.   - Continue Coreg 25 mg BID - Continue Entresto 49/51 mg BID, no BP room to increase.    - Continue ivabradine 5 mg BID - EF out of range for ICD.   - She will followup in 6 months with echo.  2. Fibromyalgia: Disabled since 1996. No change.  3. Diabetes: Off empagliflozin due to recurrent yeast infection.  4. OSA: Now using CPAP.  5. Hyperlipidemia: Continue atorvastatin.  Signed, Marca Ancona, MD  02/08/2021  [facility] [address] [facility] [place] [address] [phone] (office) [phone] (fax)

## 2021-03-02 ENCOUNTER — Other Ambulatory Visit (HOSPITAL_COMMUNITY): Payer: Self-pay | Admitting: Cardiology

## 2021-04-13 ENCOUNTER — Ambulatory Visit: Payer: Managed Care, Other (non HMO) | Attending: Urology | Admitting: Physical Therapy

## 2021-04-13 ENCOUNTER — Encounter: Payer: Self-pay | Admitting: Physical Therapy

## 2021-04-13 ENCOUNTER — Other Ambulatory Visit: Payer: Self-pay

## 2021-04-13 DIAGNOSIS — M6281 Muscle weakness (generalized): Secondary | ICD-10-CM | POA: Insufficient documentation

## 2021-04-13 DIAGNOSIS — R278 Other lack of coordination: Secondary | ICD-10-CM | POA: Insufficient documentation

## 2021-04-13 DIAGNOSIS — G8929 Other chronic pain: Secondary | ICD-10-CM | POA: Diagnosis present

## 2021-04-13 DIAGNOSIS — N393 Stress incontinence (female) (male): Secondary | ICD-10-CM

## 2021-04-13 DIAGNOSIS — M545 Low back pain, unspecified: Secondary | ICD-10-CM | POA: Diagnosis present

## 2021-04-13 NOTE — Patient Instructions (Addendum)
Moisturizers They are used in the vagina to hydrate the mucous membrane that make up the vaginal canal. Designed to keep a more normal acid balance (ph) Once placed in the vagina, it will last between two to three days.  Use 2-3 times per week at bedtime  Ingredients to avoid is glycerin and fragrance, can increase chance of infection Should not be used just before sex due to causing irritation Most are gels administered either in a tampon-shaped applicator or as a vaginal suppository. They are non-hormonal.   Types of Moisturizers(internal use)  Vitamin E vaginal suppositories- Whole foods, Amazon Moist Again Coconut oil- can break down condoms Julva- (Do no use if on Tamoxifen) amazon Yes moisturizer- amazon NeuEve Silk , NeuEve Silver for menopausal or over 65 (if have severe vaginal atrophy or cancer treatments use NeuEve Silk for  1 month than move to [employer])- [employer], [place].[employer] Olive and Bee intimate cream- [email or web address].com.au Mae vaginal moisturizer- Amazon Aloe    Creams to use externally on the Vulva area Marathon Oil (good for for cancer patients that had radiation to the area)- [place] or [employer].[email or web address]/ V-magic cream - amazon Julva-amazon Vital "V Wild Yam salve ( help moisturize and help with thinning vulvar area, does have Beeswax MoodMaid Botanical Pro-Meno Wild Yam Cream- Amazon Desert Harvest Gele Cleo by Zane Herald labial moisturizer (Amazon,  Coconut or olive oil aloe   Things to avoid in the vaginal area Do not use things to irritate the vulvar area No lotions just specialized creams for the vulva area- Neogyn, V-magic, No soaps; can use Aveeno or Calendula cleanser if needed. Must be gentle No deodorants No douches Good to sleep without underwear to let the vaginal area to air out No scrubbing: spread the lips to let warm water rinse over labias and pat dry  [facility] [address] [place], [place]  [address] Phone # [phone] Fax [phone]  Access Code: [email or web address] URL: [email or web address] Date: 04/13/2021 Prepared by: Eulis Foster  Exercises Supine Pelvic Floor Contraction - 3 x daily - 7 x weekly - 1 sets - 10 reps - 2 sec hold

## 2021-04-13 NOTE — Therapy (Signed)
[facility] [address] [place], [place], [address] Phone: [phone]   Fax:  [phone]  Physical Therapy Evaluation  Patient Details  Name: Robin Owen MRN: [id number] Date of Birth: [birth year] Referring Provider (PT): Dr. Modena Slater   Encounter Date: 04/13/2021   PT End of Session - 04/13/21 1612     Visit Number 1    Date for PT Re-Evaluation 08/13/21    Authorization Type [employer]    Authorization - Visit Number 1    Authorization - Number of Visits 30    PT Start Time 1400    PT Stop Time 1440    PT Time Calculation (min) 40 min    Activity Tolerance Patient tolerated treatment well;No increased pain    Behavior During Therapy WFL for tasks assessed/performed             Past Medical History:  Diagnosis Date   Anemia    CHF (congestive heart failure) (HCC)    Depression    Diabetes mellitus without complication (HCC)    Fibromyalgia    Hyperlipidemia    Hypertension    Hypothyroidism     Past Surgical History:  Procedure Laterality Date   CARDIAC CATHETERIZATION N/A 09/16/2015   Procedure: Right/Left Heart Cath and Coronary Angiography;  Surgeon: Peter M Swaziland, MD;  [facility];  Service: Cardiovascular;  Laterality: N/A;   ENDOMETRIAL ABLATION  2012    There were no vitals filed for this visit.    Subjective Assessment - 04/13/21 1404     Subjective 2 years ago the urinary leakage with sneeze, cough, or just comes out. Low back pain started 2 years ago.    Patient Stated Goals not have to wear a pad all the time    Currently in Pain? Yes    Pain Score 2     Pain Location Back    Pain Orientation Lower    Pain Descriptors / Indicators Discomfort    Pain Type Chronic pain    Pain Onset More than a month ago    Pain Frequency Intermittent    Aggravating Factors  sweeping, mopping,    Pain Relieving Factors stretch back pain out    Multiple Pain Sites No                 [facility] PT Assessment - 04/13/21 0001       Assessment   Medical Diagnosis R30 Dysuria; N30.20 Chronic Cystitis; N39.3 Stress incontinence; M54.5 Low back pain    Referring Provider (PT) Dr. Modena Slater    Onset Date/Surgical Date --   2 years ago   Prior Therapy none      Precautions   Precautions Other (comment)    Precaution Comments Fibromyalgia, DM, hypothyroisism, CHF      Restrictions   Weight Bearing Restrictions No      Balance Screen   Has the patient fallen in the past 6 months No    Has the patient had a decrease in activity level because of a fear of falling?  No    Is the patient reluctant to leave their home because of a fear of falling?  No      Home Environment   Living Environment Private residence      Prior Function   Level of Independence Independent    Leisure walking videos for 30 minute several times per week      Cognition   Overall Cognitive  Status Within Functional Limits for tasks assessed      ROM / Strength   AROM / PROM / Strength AROM;PROM;Strength      AROM   Lumbar Extension decreased by 50%    Lumbar - Right Rotation decreased by 25%    Lumbar - Left Rotation decreased by 25%      Strength   Overall Strength Comments when contracts the abdomen will bulge the lower abdomen and push the bladder out    Right Hip Flexion 4/5    Right Hip Extension 4/5    Right Hip ABduction 4-/5    Left Hip Flexion 4/5    Left Hip Extension 4/5    Left Hip ABduction 4-/5      Palpation   Spinal mobility decreased movement of T10-L3    SI assessment  sacrum rotated left    Palpation comment tenderness located at L2-L3                        Objective measurements completed on examination: See above findings.     Pelvic Floor Special Questions - 04/13/21 0001     Prior Pregnancies Yes    Number of Vaginal Deliveries 2    Any difficulty with labor and deliveries --   first had 2-3 stitches   Currently Sexually  Active Yes    Is this Painful Yes   sometimes; pain initially   Urinary Leakage Yes    Pad use 2 light pad, can get very wet sometimes    Activities that cause leaking Other;Coughing;Sneezing;Lifting;Exercising    Other activities that cause leaking sweeping and mopping    Urinary frequency due to being on lasik    Fecal incontinence No   strain to have a BM sometimes   Falling out feeling (prolapse) No    Skin Integrity Intact;Other    Skin Integrity other dryness    Pelvic Floor Internal Exam Patient confirms identification and approves PT to assess pelvic floor and treatment    Exam Type Vaginal    Palpation tightness in the introitus    Strength weak squeeze, no lift    Strength # of seconds 1                      PT Education - 04/13/21 1446     Education Details Access Code: [email or web address]  ; educated on vaginal moiturizers    Person(s) Educated Patient    Methods Explanation;Demonstration;Handout    Comprehension Returned demonstration              PT Short Term Goals - 04/13/21 1604       PT SHORT TERM GOAL #1   Title independent with initial HEP    Time 4    Period Weeks    Status New    Target Date 05/11/21      PT SHORT TERM GOAL #2   Title able to contract the lower abdominals without bulging them    Time 4    Period Weeks    Status New    Target Date 05/11/21      PT SHORT TERM GOAL #3   Title ability to contract the pelvic floor >/= 5 seconds with a lift    Time 4    Period Weeks    Status New    Target Date 05/11/21               PT Long Term  Goals - 04/13/21 1605       PT LONG TERM GOAL #1   Title independent with advanced HEP including core strength, pelvic floor exercises,and flexibility    Time 4    Period Months    Status New    Target Date 08/13/21      PT LONG TERM GOAL #2   Title able to cough or sneeze without bulging down on the pelvic floor so urinary leakage is decreased >/= 75%    Time 4    Period Months     Status New    Target Date 08/13/21      PT LONG TERM GOAL #3   Title able to sweep and mop with correct posture to reduce strain on lumbar to reduce lumbar pain >/= 75%    Time 4    Period Months    Status New    Target Date 08/13/21      PT LONG TERM GOAL #4   Title able to perform bending activities like sweeping and mopping without increasing abdominal pressure to reduce urinary leakage >/= 75%    Time 4    Period Months    Status New    Target Date 08/13/21                    Plan - 04/13/21 1446     Clinical Impression Statement Patient is a 60 year old female with urinary leakage and back pain for the past 2 years. Patient back pain is 2/10 when she is bending, mopping, and vacuuming. Her lumbar extension and bilateral rotation is decreased by 25%. She has pin point tenderness located along the L3 vertebrae. Patient has decreased mobility of T11-L5. Her sacrum is rotated to the left. Bilateral hip strength is 4/5 for bil. abduction and extension. Patient will leak urine with coughing, sneezing, bending forward to mop or vacuum, and during exercise. Pelvic floor strength is 2/5 with no lift holding for 1-2 seconds. When she contracts the pelvic floor she will bulge the lower abdominals and hold her breath. When patient laughed she beared down. She has tightness in the introitus. Patient will benefit from skilled therapy to improve pelvic floor coordination to reduce leakage and improve lumbar mobilty and strength to reduce pain.    Personal Factors and Comorbidities Age;Fitness;Comorbidity 3+;Time since onset of injury/illness/exacerbation;Sex    Comorbidities Fibromyalgia; DM; Hypothyroidism    Examination-Activity Limitations Continence;Lift;Squat    Examination-Participation Restrictions Cleaning;Community Activity    Stability/Clinical Decision Making Stable/Uncomplicated    Clinical Decision Making Low    Rehab Potential Excellent    PT Frequency 1x / week   every  other week   PT Duration Other (comment)   4 months   PT Treatment/Interventions ADLs/Self Care Home Management;Biofeedback;Cryotherapy;Electrical Stimulation;Iontophoresis 4mg /ml Dexamethasone;Moist Heat;Traction;Therapeutic activities;Therapeutic exercise;Neuromuscular re-education;Patient/family education;Manual techniques;Dry needling;Spinal Manipulations;Joint Manipulations    PT Next Visit Plan manual therapy to the introitus to work on mobility of the tissue; pelvic floor contraction with ball squeeze; lower abdominal contraction; manual work to lumbar and joint mobilization    PT Home Exercise Plan Access Code: [email or web address]    Consulted and Agree with Plan of Care Patient             Patient will benefit from skilled therapeutic intervention in order to improve the following deficits and impairments:  Decreased coordination, Decreased range of motion, Increased fascial restricitons, Decreased endurance, Increased muscle spasms, Pain, Decreased activity tolerance, Decreased strength, Decreased mobility  Visit Diagnosis: Muscle  weakness (generalized) - Plan: PT plan of care cert/re-cert  Other lack of coordination - Plan: PT plan of care cert/re-cert  Chronic low back pain without sciatica, unspecified back pain laterality - Plan: PT plan of care cert/re-cert  Stress incontinence (female) (female) - Plan: PT plan of care cert/re-cert     Problem List Patient Active Problem List   Diagnosis Date Noted   Pyelonephritis 03/21/2016   Sepsis (HCC) 03/21/2016   Hyponatremia 03/21/2016   Type 2 diabetes mellitus without complication, without long-term current use of insulin (HCC) 03/21/2016   Normocytic anemia 03/21/2016   Chronic systolic heart failure (HCC) 10/02/2015   Nonischemic cardiomyopathy (HCC)    Pulmonary hypertension (HCC)    Hyperlipidemia 09/14/2015   Essential hypertension 09/14/2015   Fibromyalgia 09/14/2015   Hypothyroidism 09/14/2015    Eulis Foster,  PT 04/13/21 4:13 PM   [facility] [address], [address] [place], [place], [address] Phone: [phone]   Fax:  [phone]  Name: DANELL VAZQUEZ MRN: [id number] Date of Birth: [birth year]

## 2021-05-29 ENCOUNTER — Other Ambulatory Visit (HOSPITAL_COMMUNITY): Payer: Self-pay | Admitting: Cardiology

## 2021-06-08 ENCOUNTER — Ambulatory Visit: Payer: Managed Care, Other (non HMO) | Admitting: Physical Therapy

## 2021-06-22 ENCOUNTER — Encounter: Payer: Managed Care, Other (non HMO) | Admitting: Physical Therapy

## 2021-07-05 ENCOUNTER — Encounter: Payer: Managed Care, Other (non HMO) | Admitting: Physical Therapy

## 2021-07-06 ENCOUNTER — Encounter: Payer: Managed Care, Other (non HMO) | Admitting: Physical Therapy

## 2021-07-16 ENCOUNTER — Other Ambulatory Visit (HOSPITAL_COMMUNITY): Payer: Self-pay | Admitting: Cardiology

## 2021-07-19 ENCOUNTER — Encounter: Payer: Self-pay | Admitting: Physical Therapy

## 2021-07-19 ENCOUNTER — Other Ambulatory Visit: Payer: Self-pay

## 2021-07-19 ENCOUNTER — Ambulatory Visit: Payer: Managed Care, Other (non HMO) | Attending: Urology | Admitting: Physical Therapy

## 2021-07-19 DIAGNOSIS — M6281 Muscle weakness (generalized): Secondary | ICD-10-CM | POA: Diagnosis not present

## 2021-07-19 DIAGNOSIS — G8929 Other chronic pain: Secondary | ICD-10-CM | POA: Insufficient documentation

## 2021-07-19 DIAGNOSIS — R278 Other lack of coordination: Secondary | ICD-10-CM | POA: Diagnosis present

## 2021-07-19 DIAGNOSIS — M545 Low back pain, unspecified: Secondary | ICD-10-CM | POA: Diagnosis present

## 2021-07-19 DIAGNOSIS — N393 Stress incontinence (female) (male): Secondary | ICD-10-CM | POA: Diagnosis present

## 2021-07-19 NOTE — Therapy (Signed)
[facility] [address] [place], [place], [address] Phone: [phone]   Fax:  [phone]  Physical Therapy Treatment  Patient Details  Name: Robin Owen MRN: [id number] Date of Birth: [birth year] Referring Provider (PT): Dr. Modena Slater   Encounter Date: 07/19/2021   PT End of Session - 07/19/21 1227     Visit Number 2    Authorization Type Cigna    Authorization - Visit Number 2    Authorization - Number of Visits 30    PT Start Time 1145    PT Stop Time 1225    PT Time Calculation (min) 40 min    Activity Tolerance Patient tolerated treatment well;No increased pain    Behavior During Therapy WFL for tasks assessed/performed             Past Medical History:  Diagnosis Date   Anemia    CHF (congestive heart failure) (HCC)    Depression    Diabetes mellitus without complication (HCC)    Fibromyalgia    Hyperlipidemia    Hypertension    Hypothyroidism     Past Surgical History:  Procedure Laterality Date   CARDIAC CATHETERIZATION N/A 09/16/2015   Procedure: Right/Left Heart Cath and Coronary Angiography;  Surgeon: Peter M Swaziland, MD;  [facility];  Service: Cardiovascular;  Laterality: N/A;   ENDOMETRIAL ABLATION  2012    There were no vitals filed for this visit.   Subjective Assessment - 07/19/21 1145     Subjective I have been doing the exercises. I have gotten vaginal moisturizer and it is helping. I still have to wear a pad. I am afraid when I sneeze or cough I will leak.    Patient Stated Goals not have to wear a pad all the time    Currently in Pain? Yes    Pain Score 2     Pain Location Back    Pain Orientation Lower    Pain Descriptors / Indicators Discomfort    Pain Type Chronic pain    Pain Onset More than a month ago    Pain Frequency Intermittent    Aggravating Factors  sweeping and mopping    Pain Relieving Factors stretch back pain out                                [facility] Adult PT Treatment/Exercise - 07/19/21 0001       Neuro Re-ed    Neuro Re-ed Details  diaphragmatic breathing in prone, sidely and supine with tactile cue to tack bigger breaths and open up the abdomen; educated patient to contract the pelvic floor when she coughs and sneezes.      Lumbar Exercises: Stretches   Piriformis Stretch Right;Left;1 rep;30 seconds    Piriformis Stretch Limitations supine bring leg over    Other Lumbar Stretch Exercise happy baby holding for 30 seconds      Lumbar Exercises: Standing   Other Standing Lumbar Exercises standing Pall[place] with red band, pelvic floor contraction 10x each way      Lumbar Exercises: Seated   Other Seated Lumbar Exercises seated lean foward and breath out with pelvic floor contraction 10x      Lumbar Exercises: Supine   Bridge with [employer] 15 reps    Bridge with [employer] Limitations with pelvic floor contraction and correct breath      Manual Therapy  Manual Therapy Joint mobilization;Soft tissue mobilization    Joint Mobilization T6-L5 grade 3 for PA and rotational mobilization    Soft tissue mobilization intercoastals of lower rib cage to improve mobility                     PT Education - 07/19/21 1225     Education Details Access Code: [email or web address]    Person(s) Educated Patient    Methods Explanation;Demonstration;Verbal cues;Handout    Comprehension Returned demonstration;Verbalized understanding              PT Short Term Goals - 07/19/21 1148       PT SHORT TERM GOAL #1   Title independent with initial HEP    Time 4    Period Weeks    Status Achieved      PT SHORT TERM GOAL #2   Title able to contract the lower abdominals without bulging them    Time 4    Period Weeks    Status On-going      PT SHORT TERM GOAL #3   Title ability to contract the pelvic floor >/= 5 seconds with a lift    Time 4    Period Weeks    Status On-going                PT Long Term Goals - 04/13/21 1605       PT LONG TERM GOAL #1   Title independent with advanced HEP including core strength, pelvic floor exercises,and flexibility    Time 4    Period Months    Status New    Target Date 08/13/21      PT LONG TERM GOAL #2   Title able to cough or sneeze without bulging down on the pelvic floor so urinary leakage is decreased >/= 75%    Time 4    Period Months    Status New    Target Date 08/13/21      PT LONG TERM GOAL #3   Title able to sweep and mop with correct posture to reduce strain on lumbar to reduce lumbar pain >/= 75%    Time 4    Period Months    Status New    Target Date 08/13/21      PT LONG TERM GOAL #4   Title able to perform bending activities like sweeping and mopping without increasing abdominal pressure to reduce urinary leakage >/= 75%    Time 4    Period Months    Status New    Target Date 08/13/21                   Plan - 07/19/21 1227     Clinical Impression Statement Patient has been doing her exercises. She will leak urine with coughing and sneezing. She had trouble with lower rib mobility but after manual work and education on diaphragmatic breathing there was more. Patient is learning how to contract the pelvic floor prior to coughing and sneezing. Patient will bulge her abdomen when she laughs. Patient will benefit from skilled therapy to improve pelvic floor coordination to reduce leakage and improve lumbar mobility and strength to reduce pain.    Personal Factors and Comorbidities Age;Fitness;Comorbidity 3+;Time since onset of injury/illness/exacerbation;Sex    Comorbidities Fibromyalgia; DM; Hypothyroidism    Examination-Activity Limitations Continence;Lift;Squat    Examination-Participation Restrictions Cleaning;Community Activity    Stability/Clinical Decision Making Stable/Uncomplicated    Rehab Potential Excellent    PT  Frequency 1x / week   every other week   PT Duration Other  (comment)   4 months   PT Treatment/Interventions ADLs/Self Care Home Management;Biofeedback;Cryotherapy;Electrical Stimulation;Iontophoresis 4mg /ml Dexamethasone;Moist Heat;Traction;Therapeutic activities;Therapeutic exercise;Neuromuscular re-education;Patient/family education;Manual techniques;Dry needling;Spinal Manipulations;Joint Manipulations    PT Next Visit Plan manual therapy to the introitus to work on mobility of the tissue lower abdominal contraction; manual work to lumbar and joint mobilization; sit to stand, gluteal contraction    PT Home Exercise Plan Access Code:    Recommended Other Services MD signed notes    Consulted and Agree with Plan of Care Patient             Patient will benefit from skilled therapeutic intervention in order to improve the following deficits and impairments:  Decreased coordination, Decreased range of motion, Increased fascial restricitons, Decreased endurance, Increased muscle spasms, Pain, Decreased activity tolerance, Decreased strength, Decreased mobility  Visit Diagnosis: Muscle weakness (generalized)  Other lack of coordination  Chronic low back pain without sciatica, unspecified back pain laterality  Stress incontinence (female) (female)     Problem List Patient Active Problem List   Diagnosis Date Noted   Pyelonephritis 03/21/2016   Sepsis (HCC) 03/21/2016   Hyponatremia 03/21/2016   Type 2 diabetes mellitus without complication, without long-term current use of insulin (HCC) 03/21/2016   Normocytic anemia 03/21/2016   Chronic systolic heart failure (HCC) 10/02/2015   Nonischemic cardiomyopathy (HCC)    Pulmonary hypertension (HCC)    Hyperlipidemia 09/14/2015   Essential hypertension 09/14/2015   Fibromyalgia 09/14/2015   Hypothyroidism 09/14/2015   09/16/2015, PT 07/19/21 12:32 PM  [facility] [address], [address] [place], [place], [place] Phone:  [phone]   Fax:  [phone]  Name: Robin Owen MRN: [name] Date of Birth: [birth year]

## 2021-07-19 NOTE — Patient Instructions (Signed)
Access Code: [email or web address] URL: [email or web address] Date: 07/19/2021 Prepared by: Eulis Foster  Exercises Supine Pelvic Floor Contraction - 3 x daily - 7 x weekly - 1 sets - 10 reps - 2 sec hold Supine Double Knee to Chest - 1 x daily - 7 x weekly - 1 sets - 2 reps - 30 sec hold Supine Piriformis Stretch with Leg Straight - 1 x daily - 7 x weekly - 1 sets - 2 reps - 30 sec hold Supine Pelvic Floor Stretch - 1 x daily - 7 x weekly - 1 sets - 1 reps - 30 sec hold Supine Diaphragmatic Breathing - 1 x daily - 7 x weekly - 1 sets - 10 reps Supine Bridge with Mini Swiss Ball Between Knees - 1 x daily - 7 x weekly - 1 sets - 10 reps Seated Cough with PLB - 1 x daily - 7 x weekly - 1 sets - 10 reps Standing Anti-Rotation Press with Anchored Resistance - 1 x daily - 7 x weekly - 1 sets - 10 reps  [facility] [address], [address] [place], [place] [address] Phone # [phone] Fax [phone]

## 2021-07-20 ENCOUNTER — Encounter: Payer: Managed Care, Other (non HMO) | Admitting: Physical Therapy

## 2021-07-20 ENCOUNTER — Other Ambulatory Visit (HOSPITAL_COMMUNITY): Payer: Self-pay | Admitting: Cardiology

## 2021-07-25 ENCOUNTER — Encounter: Payer: Self-pay | Admitting: Physical Therapy

## 2021-07-25 ENCOUNTER — Other Ambulatory Visit: Payer: Self-pay

## 2021-07-25 ENCOUNTER — Encounter: Payer: Managed Care, Other (non HMO) | Attending: Endocrinology | Admitting: Physical Therapy

## 2021-07-25 DIAGNOSIS — M6281 Muscle weakness (generalized): Secondary | ICD-10-CM | POA: Insufficient documentation

## 2021-07-25 DIAGNOSIS — G8929 Other chronic pain: Secondary | ICD-10-CM | POA: Diagnosis present

## 2021-07-25 DIAGNOSIS — N393 Stress incontinence (female) (male): Secondary | ICD-10-CM | POA: Insufficient documentation

## 2021-07-25 DIAGNOSIS — R278 Other lack of coordination: Secondary | ICD-10-CM | POA: Diagnosis present

## 2021-07-25 DIAGNOSIS — M545 Low back pain, unspecified: Secondary | ICD-10-CM | POA: Insufficient documentation

## 2021-07-25 NOTE — Therapy (Signed)
[facility] at [facility] [address] [place], [place], [address] Phone: [phone]   Fax:  [phone]  Physical Therapy Treatment  Patient Details  Name: Robin Owen MRN: [id number] Date of Birth: 27-Jul-1961 Referring Provider (PT): Dr. Modena Slater   Encounter Date: 07/25/2021   PT End of Session - 07/25/21 1332     Visit Number 3    Date for PT Re-Evaluation 08/13/21    Authorization Type [employer]    Authorization - Visit Number 3    Authorization - Number of Visits 30    PT Start Time 1305    PT Stop Time 1350    PT Time Calculation (min) 45 min    Activity Tolerance Patient tolerated treatment well;No increased pain    Behavior During Therapy WFL for tasks assessed/performed             Past Medical History:  Diagnosis Date   Anemia    CHF (congestive heart failure) (HCC)    Depression    Diabetes mellitus without complication (HCC)    Fibromyalgia    Hyperlipidemia    Hypertension    Hypothyroidism     Past Surgical History:  Procedure Laterality Date   CARDIAC CATHETERIZATION N/A 09/16/2015   Procedure: Right/Left Heart Cath and Coronary Angiography;  Surgeon: Peter M Swaziland, MD;  [facility]: [facility];  Service: Cardiovascular;  Laterality: N/A;   ENDOMETRIAL ABLATION  2012    There were no vitals filed for this visit.   Subjective Assessment - 07/25/21 1308     Subjective I am trying to do the sneeze and cough exercise. the pads are drer.    Patient Stated Goals not have to wear a pad all the time    Currently in Pain? Yes    Pain Score 2     Pain Location Back    Pain Orientation Lower    Pain Descriptors / Indicators Discomfort    Pain Type Chronic pain    Pain Onset More than a month ago    Pain Frequency Intermittent    Aggravating Factors  sweeping, mopping    Pain Relieving Factors stretch back pain out    Multiple Pain Sites No                [facility] PT Assessment -  07/25/21 0001       Assessment   Medical Diagnosis R30 Dysuria; N30.20 Chronic Cystitis; N39.3 Stress incontinence; M54.5 Low back pain    Referring Provider (PT) Dr. Modena Slater    Onset Date/Surgical Date --   2 years ago   Prior Therapy none      Precautions   Precautions Other (comment)    Precaution Comments Fibromyalgia, DM, hypothyroisism, CHF      Restrictions   Weight Bearing Restrictions No      Home Environment   Living Environment Private residence      Prior Function   Level of Independence Independent    Leisure walking videos for 30 minute several times per week; ride recumbent bike      Cognition   Overall Cognitive Status Within Functional Limits for tasks assessed      Posture/Postural Control   Posture/Postural Control No significant limitations      AROM   Lumbar Extension decreased by 50%    Lumbar - Right Rotation full    Lumbar - Left Rotation full      Strength   Right Hip Flexion  5/5    Right Hip Extension 4/5    Right Hip ABduction 4/5    Left Hip Flexion 5/5    Left Hip Extension 4/5    Left Hip ABduction 4-/5                        Pelvic Floor Special Questions - 07/25/21 0001     Pelvic Floor Internal Exam Patient confirms identification and approves PT to assess pelvic floor and treatment    Exam Type Vaginal    Strength weak squeeze, no lift   circular and start of a lift   Strength # of seconds 4               [facility] Adult PT Treatment/Exercise - 07/25/21 0001       Neuro Re-ed    Neuro Re-ed Details  tactile cues to contract the introitus and work on a lift while not bulging the abdomen      Knee/Hip Exercises: Prone   Hip Extension Strengthening;Right;1 set;15 reps    Hip Extension Limitations tactile cues to contract the gluteals and not raise the ilium      Manual Therapy   Manual Therapy Joint mobilization;Internal Pelvic Floor    Joint Mobilization T6-L5 grade 3 for PA and rotational mobilization     Internal Pelvic Floor manual mobilization of the sides fo the introitus and working with one finger internally and other externally to release the fascia around the vulvar area, manual work to the superficial transverse perineum                     PT Education - 07/25/21 1353     Education Details Access Code: [email or web address]    Person(s) Educated Patient    Methods Explanation;Demonstration;Verbal cues;Handout    Comprehension Returned demonstration;Verbalized understanding              PT Short Term Goals - 07/25/21 1357       PT SHORT TERM GOAL #1   Title independent with initial HEP    Time 4    Period Weeks    Status Achieved      PT SHORT TERM GOAL #2   Title able to contract the lower abdominals without bulging them    Time 4    Period Weeks    Status Achieved      PT SHORT TERM GOAL #3   Title ability to contract the pelvic floor >/= 5 seconds with a lift    Baseline almost a lift and contract 4 seconds    Time 4    Period Weeks    Status On-going               PT Long Term Goals - 04/13/21 1605       PT LONG TERM GOAL #1   Title independent with advanced HEP including core strength, pelvic floor exercises,and flexibility    Time 4    Period Months    Status New    Target Date 08/13/21      PT LONG TERM GOAL #2   Title able to cough or sneeze without bulging down on the pelvic floor so urinary leakage is decreased >/= 75%    Time 4    Period Months    Status New    Target Date 08/13/21      PT LONG TERM GOAL #3   Title able to sweep and mop with correct  posture to reduce strain on lumbar to reduce lumbar pain >/= 75%    Time 4    Period Months    Status New    Target Date 08/13/21      PT LONG TERM GOAL #4   Title able to perform bending activities like sweeping and mopping without increasing abdominal pressure to reduce urinary leakage >/= 75%    Time 4    Period Months    Status New    Target Date 08/13/21                    Plan - 07/25/21 1332     Clinical Impression Statement Patient reports her pads are dryer. She is now able to perfrom a circular contraction with a start of a lift. Patient is able to feel the contraction and not bulge her abdomen. Patient had tightness in the perineal area and along the pubovaginalis muslces. She has some difficulty with not lifting her hip when extending in prone. Patient has improved thoacic mobility and less pain after manual work. She is now able to expand the posterior rib cage better. Patient will benefit from skilled therapy to improve pelvic floor coordination to reduce leakage and improve lumbar mobiltiy and strength.    Personal Factors and Comorbidities Age;Fitness;Comorbidity 3+;Time since onset of injury/illness/exacerbation;Sex    Comorbidities Fibromyalgia; DM; Hypothyroidism    Examination-Activity Limitations Continence;Lift;Squat    Examination-Participation Restrictions Cleaning;Community Activity    Stability/Clinical Decision Making Stable/Uncomplicated    Rehab Potential Excellent    PT Frequency 1x / week   everyother week   PT Duration Other (comment)   4 months   PT Treatment/Interventions ADLs/Self Care Home Management;Biofeedback;Cryotherapy;Electrical Stimulation;Iontophoresis 4mg /ml Dexamethasone;Moist Heat;Traction;Therapeutic activities;Therapeutic exercise;Neuromuscular re-education;Patient/family education;Manual techniques;Dry needling;Spinal Manipulations;Joint Manipulations    PT Next Visit Plan hip adduction to contract the pelvic floor more; low level abdominal in supine and standing, quick flicks and longer pelvic floor contraction in sitting    PT Home Exercise Plan Access Code:    Consulted and Agree with Plan of Care Patient             Patient will benefit from skilled therapeutic intervention in order to improve the following deficits and impairments:  Decreased coordination, Decreased range of motion,  Increased fascial restricitons, Decreased endurance, Increased muscle spasms, Pain, Decreased activity tolerance, Decreased strength, Decreased mobility  Visit Diagnosis: Muscle weakness (generalized)  Other lack of coordination  Chronic low back pain without sciatica, unspecified back pain laterality  Stress incontinence (female) (female)     Problem List Patient Active Problem List   Diagnosis Date Noted   Pyelonephritis 03/21/2016   Sepsis (HCC) 03/21/2016   Hyponatremia 03/21/2016   Type 2 diabetes mellitus without complication, without long-term current use of insulin (HCC) 03/21/2016   Normocytic anemia 03/21/2016   Chronic systolic heart failure (HCC) 10/02/2015   Nonischemic cardiomyopathy (HCC)    Pulmonary hypertension (HCC)    Hyperlipidemia 09/14/2015   Essential hypertension 09/14/2015   Fibromyalgia 09/14/2015   Hypothyroidism 09/14/2015    09/16/2015, PT 07/25/21 1:58 PM  [facility] [address] [place], [place], [place] Phone: [phone]   Fax:  [phone]  Name: Robin Owen MRN: [name] Date of Birth: July 14, 1961

## 2021-07-25 NOTE — Patient Instructions (Signed)
Access Code: [email or web address] URL: [email or web address] Date: 07/25/2021 Prepared by: Eulis Foster  Exercises Supine Pelvic Floor Contraction - 3 x daily - 7 x weekly - 1 sets - 10 reps - 2 sec hold Supine Double Knee to Chest - 1 x daily - 7 x weekly - 1 sets - 2 reps - 30 sec hold Supine Piriformis Stretch with Leg Straight - 1 x daily - 7 x weekly - 1 sets - 2 reps - 30 sec hold Supine Pelvic Floor Stretch - 1 x daily - 7 x weekly - 1 sets - 1 reps - 30 sec hold Supine Diaphragmatic Breathing - 1 x daily - 7 x weekly - 1 sets - 10 reps Supine Bridge with Mini Swiss Ball Between Knees - 1 x daily - 7 x weekly - 1 sets - 10 reps Seated Cough with PLB - 1 x daily - 7 x weekly - 1 sets - 10 reps Standing Anti-Rotation Press with Anchored Resistance - 1 x daily - 7 x weekly - 1 sets - 10 reps Beginner Prone Single Leg Raise - 1 x daily - 7 x weekly - 1 sets - 10 reps  Eulis Foster, PT [facility] [address], [address] [place], [place] [address] W: [phone] [email or web address]

## 2021-07-27 ENCOUNTER — Encounter: Payer: Self-pay | Admitting: Family Medicine

## 2021-07-27 ENCOUNTER — Ambulatory Visit (INDEPENDENT_AMBULATORY_CARE_PROVIDER_SITE_OTHER): Payer: Managed Care, Other (non HMO) | Admitting: Family Medicine

## 2021-07-27 ENCOUNTER — Other Ambulatory Visit: Payer: Self-pay

## 2021-07-27 VITALS — BP 114/60 | HR 93 | Ht 66.0 in | Wt 178.0 lb

## 2021-07-27 DIAGNOSIS — I10 Essential (primary) hypertension: Secondary | ICD-10-CM

## 2021-07-27 DIAGNOSIS — I428 Other cardiomyopathies: Secondary | ICD-10-CM

## 2021-07-27 DIAGNOSIS — I5022 Chronic systolic (congestive) heart failure: Secondary | ICD-10-CM | POA: Diagnosis not present

## 2021-07-27 DIAGNOSIS — Z794 Long term (current) use of insulin: Secondary | ICD-10-CM

## 2021-07-27 DIAGNOSIS — Z23 Encounter for immunization: Secondary | ICD-10-CM

## 2021-07-27 DIAGNOSIS — E039 Hypothyroidism, unspecified: Secondary | ICD-10-CM

## 2021-07-27 DIAGNOSIS — E1165 Type 2 diabetes mellitus with hyperglycemia: Secondary | ICD-10-CM

## 2021-07-27 DIAGNOSIS — M797 Fibromyalgia: Secondary | ICD-10-CM | POA: Diagnosis not present

## 2021-07-27 MED ORDER — TRAMADOL HCL 50 MG PO TABS: 50.0000 mg | ORAL_TABLET | Freq: Four times a day (QID) | ORAL | 0 refills | Status: DC | PRN

## 2021-07-27 MED ORDER — CYCLOBENZAPRINE HCL 5 MG PO TABS: 5.0000 mg | ORAL_TABLET | Freq: Two times a day (BID) | ORAL | 1 refills | Status: DC

## 2021-07-27 NOTE — Progress Notes (Signed)
Subjective:  Patient ID: Robin Owen, female    DOB: 03-26-1961  Age: 60 y.o. MRN: [id number]  CC:  Chief Complaint  Patient presents with   Establish Care    HPI KEITRA CARUSONE presents for  New patient establish care. Primary PCP Dr. Leslie Dales, also as her endocrinologist.   Daughter is middle school Wellsite geologist in [place].  Son works for [employer]  Husband Doctor, hospital for [employer].   Care team includes: Endocrinology, Dr. [name] Cardiology, Dr. Shirlee Latch Ophthalmology, Dr. [name] Gynecology Dr. Aldona Bar Gastrointestinal Dr. [name] Urology: Dr. Alvester Morin   History of diabetes type 2, with hyperglycemia,  Followed by Dr. [name], previously Dr. Leslie Dales Last note reviewed from September 27, A1c improved from 8.0-7.1.  Tresiba decreased to 24 units as prior hypoglycemia, continued on semaglutide 0.5 mg weekly, metformin 1000 mg twice daily, glimepiride 2 mg at bedtime with option to decrease to 1 mg or discontinue if recurrence of hypoglycemia. She is on statin. No further low blood sugars. A1c 7.1 on 9/20     Hypothyroidism Also followed by endocrinology, TSH was at goal of 1.53 on 9/20. continued on Synthroid 112 mcg daily.  Cardiac Cardiologist Dr. Shirlee Latch, last visit April 12.  Chronic systolic heart failure.  Prior echo in 2016 at time of pulmonary edema with reduced EF of 20%.  RHC/LHC showed normal coronaries, reduced cardiac index, cardiac MRI with mid wall late gadolinium enhancement of the septum, and ICM possibly from hypertension.   Also with history of OSA, on CPAP- works ok - using nightly, feels rested.  Echo earlier this year with EF 50 to 55%, basal inferior hypokinesis with normal RV. Continued on Lasix 40 mg twice daily, spironolactone 25 mg daily, Coreg 25 mg twice daily, Entresto 49/51 mg twice daily, ivabradine 5 mg twice daily, EF out of range for ICD, 22-month follow-up with echo. Echo and follow up 09/07/21.  No new CP/dyspnea. No new side effects with  meds.  Lpiid panel 9/20 - LDL 75 on 20mg  Lipitor.   History of AKI Developed AKI, hyperkalemia with previous high-dose ibuprofen.  Tramadol as needed in place of NSAID from cardiology. Creatinine 1.28 on 9/20 - lower than in June (1.38).   Fibromyalgia  On disability since 1996.  Pain in hips, thighs, ribs.  Treated with tramadol as needed for pain, 50 mg BID. Skips morning dose rarely.   Also on Fluoxetine 20 mg daily for depression.   Gabapentin 600 mg twice daily and flexeril 10mg  BID. Stable with current regimen. Some fatigue with current regimen.  Controlled substance database (PDMP) reviewed. No concerns appreciated.  Tramadol #180 on 04/14/21.   Depression screen [facility] 2/9 07/27/2021 04/04/2016 12/19/2015  Decreased Interest 0 0 1  Down, Depressed, Hopeless 0 0 1  PHQ - 2 Score 0 0 2  Altered sleeping - - 0  Tired, decreased energy - - 1  Change in appetite - - 0  Feeling bad or failure about yourself  - - 1  Trouble concentrating - - 1  Moving slowly or fidgety/restless - - 0  Suicidal thoughts - - 0  PHQ-9 Score - - 5  Difficult doing work/chores - - Somewhat difficult     In PT for stress incontinence. Followed by Dr. 06/04/2016.   HM: Colonoscopy at age 40 and 34.   History Patient Active Problem List   Diagnosis Date Noted   Pyelonephritis 03/21/2016   Sepsis (HCC) 03/21/2016   Hyponatremia 03/21/2016   Type 2 diabetes mellitus  without complication, without long-term current use of insulin (HCC) 03/21/2016   Normocytic anemia 03/21/2016   Chronic systolic heart failure (HCC) 10/02/2015   Nonischemic cardiomyopathy (HCC)    Pulmonary hypertension (HCC)    Hyperlipidemia 09/14/2015   Essential hypertension 09/14/2015   Fibromyalgia 09/14/2015   Hypothyroidism 09/14/2015   Past Medical History:  Diagnosis Date   Anemia    CHF (congestive heart failure) (HCC)    Depression    Diabetes mellitus without complication (HCC)    Fibromyalgia    Hyperlipidemia     Hypertension    Hypothyroidism    Past Surgical History:  Procedure Laterality Date   CARDIAC CATHETERIZATION N/A 09/16/2015   Procedure: Right/Left Heart Cath and Coronary Angiography;  Surgeon: Peter M Swaziland, MD;  [facility]: [facility];  Service: Cardiovascular;  Laterality: N/A;   ENDOMETRIAL ABLATION  2012   Allergies  Allergen Reactions   Macrobid [Nitrofurantoin] Other (See Comments)    Fever, flu like symtoms   Prior to Admission medications   Medication Sig Start Date End Date Taking? Authorizing Provider  acetaminophen (TYLENOL) 325 MG tablet Take 650 mg by mouth every 6 (six) hours as needed for mild pain.   Yes [provider]  aspirin 81 MG EC tablet Adult Low Dose Aspirin 81 mg tablet,delayed release  Take 1 tablet every day by oral route.   Yes [provider]  atorvastatin (LIPITOR) 20 MG tablet Take 20 mg by mouth daily. 01/08/20  Yes [provider]  carvedilol (COREG) 25 MG tablet TAKE 1 TABLET BY MOUTH TWICE A DAY WITH MEALS 05/29/21  Yes Laurey Morale, MD  Cholecalciferol 25 MCG (1000 UT) tablet Take 1,000 Units by mouth daily.   Yes [provider]  Continuous Blood Gluc Sensor (FREESTYLE LIBRE 2 SENSOR) MISC APPLY SENSOR AS DIRECTED EVERY 14 DAYS 04/29/20  Yes [provider]  CORLANOR 5 MG TABS tablet TAKE 1 TABLET BY MOUTH TWICE A DAY WITH MEALS 09/19/20  Yes Laurey Morale, MD  cyclobenzaprine (FLEXERIL) 10 MG tablet Take 10 mg by mouth 2 (two) times daily as needed for muscle spasms (pt takes qhs and PRN).    Yes [provider]  ENTRESTO 49-51 MG TAKE 1 TABLET BY MOUTH TWICE A DAY 01/11/21  Yes Laurey Morale, MD  famotidine (PEPCID) 40 MG tablet Take 40 mg by mouth at bedtime. 05/10/20  Yes [provider]  FLUoxetine (PROZAC) 20 MG tablet Take 20 mg by mouth daily.   Yes [provider]  furosemide (LASIX) 40 MG tablet TAKE 1 TABLET BY MOUTH TWICE A DAY 07/19/21  Yes Laurey Morale, MD  gabapentin (NEURONTIN) 600 MG tablet Take 600 mg by mouth 2 (two) times daily.   Yes [provider]  glimepiride (AMARYL) 2 MG tablet Take 1 tablet twice a day as needed for glucose above 100 02/19/19  Yes [provider]  insulin degludec (TRESIBA) 100 UNIT/ML SOPN FlexTouch Pen Inject 26 Units into the skin at bedtime.  01/15/19  Yes [provider]  Iron-FA-B Cmp-C-Biot-Probiotic (FUSION PLUS PO) Take 1 capsule by mouth daily.   Yes [provider]  levothyroxine (SYNTHROID, LEVOTHROID) 88 MCG tablet Take 88 mcg by mouth daily before breakfast.   Yes [provider]  metFORMIN (GLUCOPHAGE) 1000 MG tablet Take 1 tablet (1,000 mg total) by mouth 2 (two) times daily with a meal. 09/17/15  Yes Calvert Cantor, MD  Multiple Vitamin (MULTIVITAMIN WITH MINERALS) TABS tablet  Take 1 tablet by mouth daily.   Yes [provider]  NOVOTWIST 32G X 5 MM MISC Use as directed with Victozia. 08/19/15  Yes [provider]  Letta Pate VERIO test strip Check blood glucose once daily 08/19/15  Yes [provider]  spironolactone (ALDACTONE) 25 MG tablet TAKE 1 TABLET BY MOUTH EVERY DAY 07/24/21  Yes Laurey Morale, MD  traMADol (ULTRAM) 50 MG tablet Take 50 mg by mouth every 6 (six) hours as needed for moderate pain (pt takes qhs and PRN).    Yes [provider]  Urine Glucose-Ketones Test (KETO-DIASTIX) STRP 1 strip by Misc.(Non-Drug; Combo Route) route 2 (two) times daily as needed. Use to check urine for ketones with high blood glucose 09/01/17  Yes [provider]  Wheat Dextrin (BENEFIBER) POWD Take by mouth.   Yes [provider]  atorvastatin (LIPITOR) 40 MG tablet TAKE 1 TABLET BY MOUTH EVERY DAY Patient not taking: Reported on 07/27/2021 03/03/21   Laurey Morale, MD   Social History   Socioeconomic History   Marital status: Married    Spouse name: Not on file   Number of children: Not on file   Years of  education: Not on file   Highest education level: Not on file  Occupational History   Not on file  Tobacco Use   Smoking status: Never   Smokeless tobacco: Never  Substance and Sexual Activity   Alcohol use: No   Drug use: No   Sexual activity: Yes    Birth control/protection: None  Other Topics Concern   Not on file  Social History Narrative   Not on file   Social Determinants of Health   Financial Resource Strain: Not on file  Food Insecurity: Not on file  Transportation Needs: Not on file  Physical Activity: Not on file  Stress: Not on file  Social Connections: Not on file  Intimate Partner Violence: Not on file    Review of Systems Per HPI.   Objective:   Vitals:   07/27/21 1511  BP: 114/60  Pulse: 93  SpO2: 98%  Weight: 178 lb (80.7 kg)  Height: 5\' 6"  (1.676 m)     Physical Exam Vitals reviewed.  Constitutional:      Appearance: Normal appearance. She is well-developed.  HENT:     Head: Normocephalic and atraumatic.  Eyes:     Conjunctiva/sclera: Conjunctivae normal.     Pupils: Pupils are equal, round, and reactive to light.  Neck:     Vascular: No carotid bruit.  Cardiovascular:     Rate and Rhythm: Normal rate and regular rhythm.     Heart sounds: Normal heart sounds.  Pulmonary:     Effort: Pulmonary effort is normal.     Breath sounds: Normal breath sounds.  Abdominal:     Palpations: Abdomen is soft. There is no pulsatile mass.     Tenderness: There is no abdominal tenderness.  Musculoskeletal:     Right lower leg: No edema.     Left lower leg: No edema.  Skin:    General: Skin is warm and dry.  Neurological:     Mental Status: She is alert and oriented to person, place, and time.  Psychiatric:        Mood and Affect: Mood normal.        Behavior: Behavior normal.       Assessment & Plan:  JAKALA HERFORD is a 60 y.o. female . Fibromyalgia - Plan: traMADol (  ULTRAM) 50 MG tablet, cyclobenzaprine (FLEXERIL) 5 MG  tablet  -Tramadol, refilled for twice daily dosing, trial of lower dose Flexeril to see if that is just as effective with less sedation.  Continue OSA for CPAP.  Continue gabapentin same dose, and fluoxetine for depression.  Recheck next 3 months.  Overall stable symptoms.  Need for immunization against influenza - Plan: Flu Vaccine QUAD 1mo+IM (Fluarix, Fluzone & Alfiuria Quad PF)  Essential hypertension Nonischemic cardiomyopathy (HCC) Chronic systolic heart failure (HCC)  -Blood pressure stable, tolerating current meds, appears euvolemic, continue routine follow-up with cardiology as planned.  Type 2 diabetes mellitus with hyperglycemia, with long-term current use of insulin (HCC)  -Improved A1c on recent visit with endocrinology, denies hypoglycemia since that visit but discussed plan as noted from endocrinology including possible lower dose of sulfonylurea.  Hypothyroidism, unspecified type  -Stable on most recent TSH, continue same dose Synthroid with ongoing follow-up with endocrinology.  Meds ordered this encounter  Medications   traMADol (ULTRAM) 50 MG tablet    Sig: Take 1 tablet (50 mg total) by mouth every 6 (six) hours as needed for moderate pain (pt takes qhs and PRN).    Dispense:  180 tablet    Refill:  0   cyclobenzaprine (FLEXERIL) 5 MG tablet    Sig: Take 1 tablet (5 mg total) by mouth in the morning and at bedtime.    Dispense:  60 tablet    Refill:  1   Patient Instructions  If any return of low blood sugars, you may need to decrease glimepiride or stop it - can discuss with your diabetes specialist.   You can try the lower dose of flexeril to see if that is still effective with less sedation. If not, return to the 10mg  dose.   Follow up in 3 months, but let me know if there are questions sooner. Thanks for coming in today and take care.        Signed,   , MD [facility] 07/27/21 5:42 PM

## 2021-07-27 NOTE — Patient Instructions (Addendum)
If any return of low blood sugars, you may need to decrease glimepiride or stop it - can discuss with your diabetes specialist.   You can try the lower dose of flexeril to see if that is still effective with less sedation. If not, return to the 10mg  dose.   Follow up in 3 months, but let me know if there are questions sooner. Thanks for coming in today and take care.

## 2021-08-03 ENCOUNTER — Encounter: Payer: Managed Care, Other (non HMO) | Admitting: Physical Therapy

## 2021-08-03 ENCOUNTER — Encounter: Payer: Self-pay | Admitting: Physical Therapy

## 2021-08-07 ENCOUNTER — Encounter: Payer: Self-pay | Admitting: Family Medicine

## 2021-08-07 NOTE — Telephone Encounter (Signed)
Please advise if pt can receive a booster

## 2021-08-16 ENCOUNTER — Other Ambulatory Visit: Payer: Self-pay

## 2021-08-16 ENCOUNTER — Encounter: Payer: Self-pay | Admitting: Physical Therapy

## 2021-08-16 ENCOUNTER — Ambulatory Visit: Payer: Managed Care, Other (non HMO) | Attending: Urology | Admitting: Physical Therapy

## 2021-08-16 DIAGNOSIS — R278 Other lack of coordination: Secondary | ICD-10-CM | POA: Diagnosis present

## 2021-08-16 DIAGNOSIS — M6281 Muscle weakness (generalized): Secondary | ICD-10-CM | POA: Diagnosis present

## 2021-08-16 DIAGNOSIS — M545 Low back pain, unspecified: Secondary | ICD-10-CM | POA: Insufficient documentation

## 2021-08-16 DIAGNOSIS — G8929 Other chronic pain: Secondary | ICD-10-CM | POA: Insufficient documentation

## 2021-08-16 DIAGNOSIS — N393 Stress incontinence (female) (male): Secondary | ICD-10-CM | POA: Insufficient documentation

## 2021-08-16 NOTE — Therapy (Signed)
[facility] [address] [place], [place], [address] Phone: [phone]   Fax:  [phone]  Physical Therapy Treatment  Patient Details  Name: Robin Owen MRN: [id number] Date of Birth: 01/04/1961 Referring Provider (PT): Dr. Modena Slater   Encounter Date: 08/16/2021   PT End of Session - 08/16/21 1148     Visit Number 4    Date for PT Re-Evaluation 11/05/21    Authorization Type [employer]    Authorization - Visit Number 4    Authorization - Number of Visits 30    PT Start Time 1100    PT Stop Time 1140    PT Time Calculation (min) 40 min    Activity Tolerance Patient tolerated treatment well;No increased pain    Behavior During Therapy WFL for tasks assessed/performed             Past Medical History:  Diagnosis Date   Anemia    CHF (congestive heart failure) (HCC)    Depression    Diabetes mellitus without complication (HCC)    Fibromyalgia    Hyperlipidemia    Hypertension    Hypothyroidism     Past Surgical History:  Procedure Laterality Date   CARDIAC CATHETERIZATION N/A 09/16/2015   Procedure: Right/Left Heart Cath and Coronary Angiography;  Surgeon: Peter M Swaziland, MD;  [facility];  Service: Cardiovascular;  Laterality: N/A;   ENDOMETRIAL ABLATION  2012    There were no vitals filed for this visit.   Subjective Assessment - 08/16/21 1103     Subjective Pads are 65% dryer. Back pain is better due to her not noticing it as much as she was.    Patient Stated Goals not have to wear a pad all the time    Currently in Pain? No/denies                [facility] PT Assessment - 08/16/21 0001       Assessment   Medical Diagnosis R30 Dysuria; N30.20 Chronic Cystitis; N39.3 Stress incontinence; M54.5 Low back pain    Referring Provider (PT) Dr. Modena Slater    Onset Date/Surgical Date --   2 years ago   Prior Therapy none      Precautions   Precautions Other (comment)     Precaution Comments Fibromyalgia, DM, hypothyroisism, CHF      Restrictions   Weight Bearing Restrictions No      Home Environment   Living Environment Private residence      Prior Function   Level of Independence Independent    Leisure walking videos for 30 minute several times per week; ride recumbent bike      Cognition   Overall Cognitive Status Within Functional Limits for tasks assessed      AROM   Lumbar Extension decreased by 50%      Strength   Right Hip Flexion 5/5    Right Hip Extension 4/5    Right Hip ABduction 4/5    Left Hip Flexion 5/5    Left Hip Extension 4/5    Left Hip ABduction 4-/5                        Pelvic Floor Special Questions - 08/16/21 0001     Urinary Leakage Yes    Pad use 1 pad for just in case    Activities that cause leaking Other    Other activities that cause  leaking mopping    Pelvic Floor Internal Exam Patient confirms identification and approves PT to assess pelvic floor and treatment    Exam Type Vaginal    Strength weak squeeze, no lift   weak lift and circular   Strength # of seconds 4               [facility] Adult PT Treatment/Exercise - 08/16/21 0001       Lumbar Exercises: Standing   Other Standing Lumbar Exercises pull and lunge like she is sweeping with pelvic floor contraction 10x each way      Lumbar Exercises: Supine   Other Supine Lumbar Exercises tactile cues to the introitus to facilitate the pelvic floor contraction with a slight lift      Manual Therapy   Manual Therapy Internal Pelvic Floor    Internal Pelvic Floor manual work to the intoritus with one finger internally and externally to release the sides of the introitus, manual work with the bilateral urethra sphincter and bulbocavernosus                       PT Short Term Goals - 08/16/21 1144       PT SHORT TERM GOAL #1   Title independent with initial HEP    Time 4    Period Weeks    Status Achieved      PT  SHORT TERM GOAL #2   Title able to contract the lower abdominals without bulging them    Time 4    Period Weeks    Status Achieved      PT SHORT TERM GOAL #3   Title ability to contract the pelvic floor >/= 5 seconds with a lift    Time 4    Period Weeks    Status Achieved               PT Long Term Goals - 08/16/21 1104       PT LONG TERM GOAL #1   Title independent with advanced HEP including core strength, pelvic floor exercises,and flexibility    Time 4    Period Months    Status On-going    Target Date 08/13/21      PT LONG TERM GOAL #2   Title able to cough or sneeze without bulging down on the pelvic floor so urinary leakage is decreased >/= 75%    Time 4    Period Months    Status On-going    Target Date 08/13/21      PT LONG TERM GOAL #3   Title able to sweep and mop with correct posture to reduce strain on lumbar to reduce lumbar pain >/= 75%    Time 4    Period Months    Status On-going    Target Date 08/13/21      PT LONG TERM GOAL #4   Title able to perform bending activities like sweeping and mopping without increasing abdominal pressure to reduce urinary leakage >/= 75%    Time 4    Period Months    Status On-going    Target Date 08/13/21                   Plan - 08/16/21 1121     Clinical Impression Statement Patient reports her urinary leakage is 65% better. She is dry 3 out of 7 days. Pelvic floor strength is 3/5 with a weak lift and circular contraction. Patient is able  to breath and contract her lower abdominals with pelvic floor contraction. Increased in bilateral hip strength but hip extension and abduction is still weak. Patient will leak at end range of stretch. She will leak other times but not always aware of it. She will wear a pad to go out for just in case. Patient will benefit from skilled therapy to improve pelvic floor coordination to reduce leakage and improve lumbar mobility and strength.    Personal Factors and  Comorbidities Age;Fitness;Comorbidity 3+;Time since onset of injury/illness/exacerbation;Sex    Comorbidities Fibromyalgia; DM; Hypothyroidism    Examination-Activity Limitations Continence;Lift;Squat    Examination-Participation Restrictions Cleaning;Community Activity    Stability/Clinical Decision Making Stable/Uncomplicated    Rehab Potential Excellent    PT Frequency 1x / week   every other week   PT Duration 12 weeks    PT Treatment/Interventions ADLs/Self Care Home Management;Biofeedback;Cryotherapy;Electrical Stimulation;Iontophoresis 4mg /ml Dexamethasone;Moist Heat;Traction;Therapeutic activities;Therapeutic exercise;Neuromuscular re-education;Patient/family education;Manual techniques;Dry needling;Spinal Manipulations;Joint Manipulations    PT Next Visit Plan hip adduction to contract the pelvic floor more; low level abdominal in supine and standing, quick flicks and longer pelvic floor contraction in sitting    PT Home Exercise Plan Access Code:    Recommended Other Services sent renewal    Consulted and Agree with Plan of Care Patient             Patient will benefit from skilled therapeutic intervention in order to improve the following deficits and impairments:  Decreased coordination, Decreased range of motion, Increased fascial restricitons, Decreased endurance, Increased muscle spasms, Pain, Decreased activity tolerance, Decreased strength, Decreased mobility  Visit Diagnosis: Muscle weakness (generalized) - Plan: PT plan of care cert/re-cert  Other lack of coordination - Plan: PT plan of care cert/re-cert  Chronic low back pain without sciatica, unspecified back pain laterality - Plan: PT plan of care cert/re-cert  Stress incontinence (female) (female) - Plan: PT plan of care cert/re-cert     Problem List Patient Active Problem List   Diagnosis Date Noted   Pyelonephritis 03/21/2016   Sepsis (HCC) 03/21/2016   Hyponatremia 03/21/2016   Type 2 diabetes  mellitus without complication, without long-term current use of insulin (HCC) 03/21/2016   Normocytic anemia 03/21/2016   Chronic systolic heart failure (HCC) 10/02/2015   Nonischemic cardiomyopathy (HCC)    Pulmonary hypertension (HCC)    Hyperlipidemia 09/14/2015   Essential hypertension 09/14/2015   Fibromyalgia 09/14/2015   Hypothyroidism 09/14/2015    09/16/2015, PT 08/16/21 11:48 AM  [facility] [address] [place], [place], [place] Phone: [phone]   Fax:  [phone]  Name: LITHZY BERNARD MRN: [name] Date of Birth: 19-Feb-1961

## 2021-08-17 ENCOUNTER — Encounter: Payer: Managed Care, Other (non HMO) | Admitting: Physical Therapy

## 2021-08-30 ENCOUNTER — Encounter: Payer: Self-pay | Admitting: Physical Therapy

## 2021-08-30 ENCOUNTER — Ambulatory Visit: Payer: Managed Care, Other (non HMO) | Attending: Urology | Admitting: Physical Therapy

## 2021-08-30 ENCOUNTER — Other Ambulatory Visit: Payer: Self-pay

## 2021-08-30 DIAGNOSIS — G8929 Other chronic pain: Secondary | ICD-10-CM | POA: Diagnosis present

## 2021-08-30 DIAGNOSIS — R278 Other lack of coordination: Secondary | ICD-10-CM | POA: Diagnosis present

## 2021-08-30 DIAGNOSIS — M6281 Muscle weakness (generalized): Secondary | ICD-10-CM | POA: Insufficient documentation

## 2021-08-30 DIAGNOSIS — N393 Stress incontinence (female) (male): Secondary | ICD-10-CM | POA: Insufficient documentation

## 2021-08-30 DIAGNOSIS — M545 Low back pain, unspecified: Secondary | ICD-10-CM | POA: Insufficient documentation

## 2021-08-30 NOTE — Patient Instructions (Signed)
Access Code: [email or web address] URL: [email or web address] Date: 08/30/2021 Prepared by: Eulis Foster  Exercises Supine Double Knee to Chest - 1 x daily - 7 x weekly - 1 sets - 2 reps - 30 sec hold Supine Piriformis Stretch with Leg Straight - 1 x daily - 7 x weekly - 1 sets - 2 reps - 30 sec hold Supine Pelvic Floor Stretch - 1 x daily - 7 x weekly - 1 sets - 1 reps - 30 sec hold Supine Diaphragmatic Breathing - 1 x daily - 7 x weekly - 1 sets - 10 reps Supine Bridge with Mini Swiss Ball Between Knees - 1 x daily - 7 x weekly - 1 sets - 10 reps Standing Anti-Rotation Press with Anchored Resistance - 1 x daily - 7 x weekly - 1 sets - 10 reps Beginner Prone Single Leg Raise - 1 x daily - 7 x weekly - 1 sets - 10 reps Hooklying Isometric Hip Flexion - 1 x daily - 7 x weekly - 1 sets - 5 reps - 5 sec hold Sit to Stand with Pelvic Floor Contraction - 1 x daily - 7 x weekly - 1 sets - 10 reps [facility] [address], [address] [place], [place] [address] Phone # [phone] Fax [phone]

## 2021-08-30 NOTE — Therapy (Signed)
[facility] [address] [place], [place], [address] Phone: [phone]   Fax:  [phone]  Physical Therapy Treatment  Patient Details  Name: Robin Owen MRN: [id number] Date of Birth: [birth year] Referring Provider (PT): Dr. Modena Slater   Encounter Date: 08/30/2021   PT End of Session - 08/30/21 1410     Visit Number 5    Date for PT Re-Evaluation 11/05/21    Authorization Type [employer]    Authorization - Visit Number 5    Authorization - Number of Visits 30    PT Start Time 1400    PT Stop Time 1440    PT Time Calculation (min) 40 min    Activity Tolerance Patient tolerated treatment well;No increased pain    Behavior During Therapy WFL for tasks assessed/performed             Past Medical History:  Diagnosis Date   Anemia    CHF (congestive heart failure) (HCC)    Depression    Diabetes mellitus without complication (HCC)    Fibromyalgia    Hyperlipidemia    Hypertension    Hypothyroidism     Past Surgical History:  Procedure Laterality Date   CARDIAC CATHETERIZATION N/A 09/16/2015   Procedure: Right/Left Heart Cath and Coronary Angiography;  Surgeon: Peter M Swaziland, MD;  [facility];  Service: Cardiovascular;  Laterality: N/A;   ENDOMETRIAL ABLATION  2012    There were no vitals filed for this visit.   Subjective Assessment - 08/30/21 1403     Subjective I am 65% dryer. I am wearing a thinner pad.    Patient Stated Goals not have to wear a pad all the time    Currently in Pain? No/denies    Multiple Pain Sites No                            Pelvic Floor Special Questions - 08/30/21 0001     Biofeedback sitting quick flicks with upright posture, contract 2 seconds in sitting, sidely , sidely queeze pelvic floor and towel roll between knees,    Biofeedback sensor type Surface   vaginal              [facility] Adult PT Treatment/Exercise - 08/30/21 0001        Lumbar Exercises: Stretches   Other Lumbar Stretch Exercise supine butterfly stretch holding for 1 min      Lumbar Exercises: Seated   Other Seated Lumbar Exercises seated move femur foward 10x each side with pelvic floor EMG    Other Seated Lumbar Exercises sit to stand with pelvic floor EMG      Lumbar Exercises: Supine   Bridge with [employer] 15 reps    Bridge with [employer] Limitations with pelvic floor contraction and correct breath    Isometric Hip Flexion 10 reps;5 seconds    Isometric Hip Flexion Limitations each leg with EMG and pelvic floor contraction   tried diagonal but not good pelvic floor contraction     Knee/Hip Exercises: Prone   Hip Extension Strengthening;Right;1 set;15 reps    Hip Extension Limitations with the pelvic EMG on                     PT Education - 08/30/21 1441     Education Details Access Code: [email or web address]    Methods Explanation;Demonstration;Verbal cues;Handout    Comprehension Returned  demonstration;Verbalized understanding              PT Short Term Goals - 08/16/21 1144       PT SHORT TERM GOAL #1   Title independent with initial HEP    Time 4    Period Weeks    Status Achieved      PT SHORT TERM GOAL #2   Title able to contract the lower abdominals without bulging them    Time 4    Period Weeks    Status Achieved      PT SHORT TERM GOAL #3   Title ability to contract the pelvic floor >/= 5 seconds with a lift    Time 4    Period Weeks    Status Achieved               PT Long Term Goals - 08/30/21 1444       PT LONG TERM GOAL #1   Title independent with advanced HEP including core strength, pelvic floor exercises,and flexibility    Time 4    Period Months    Status On-going      PT LONG TERM GOAL #2   Title able to cough or sneeze without bulging down on the pelvic floor so urinary leakage is decreased >/= 75%    Time 4    Period Months    Status On-going      PT LONG TERM GOAL #3    Title able to sweep and mop with correct posture to reduce strain on lumbar to reduce lumbar pain >/= 75%    Time 4    Period Months    Status On-going      PT LONG TERM GOAL #4   Title able to perform bending activities like sweeping and mopping without increasing abdominal pressure to reduce urinary leakage >/= 75%    Time 4    Period Months    Status On-going                   Plan - 08/30/21 1411     Clinical Impression Statement Patient continues to use a thinner pad. Patient is able to relax to 2 uv. She is able to contract the pelvic floor better with sidely ball squeeze, bridge with ball squeeze, hip flexion isometric, and sit to stand. Patient is not able to feel and understand on how to contract the pelvic floor using the EMG. Patient is not able to contract more than 2 seconds without overflow from other muscles. Patient felt the muscle be looser after the manual work from last visit. Patient will benefit from skilled therapy to improve pelvic floor coordination to reduce leakage and improve lumbar mobitliy and strength.    Personal Factors and Comorbidities Age;Fitness;Comorbidity 3+;Time since onset of injury/illness/exacerbation;Sex    Comorbidities Fibromyalgia; DM; Hypothyroidism    Examination-Activity Limitations Continence;Lift;Squat    Examination-Participation Restrictions Cleaning;Community Activity    Stability/Clinical Decision Making Stable/Uncomplicated    Rehab Potential Excellent    PT Frequency 1x / week   every other week   PT Duration 12 weeks    PT Treatment/Interventions ADLs/Self Care Home Management;Biofeedback;Cryotherapy;Electrical Stimulation;Iontophoresis 4mg /ml Dexamethasone;Moist Heat;Traction;Therapeutic activities;Therapeutic exercise;Neuromuscular re-education;Patient/family education;Manual techniques;Dry needling;Spinal Manipulations;Joint Manipulations    PT Next Visit Plan use the pelvic floor EMG with exercise in standing, sitting and  to see which motions have best pelvic floor muscle recruitment, with sweeping, coughing    PT Home Exercise Plan Access Code:  Recommended Other Services MD signed all notes    Consulted and Agree with Plan of Care Patient             Patient will benefit from skilled therapeutic intervention in order to improve the following deficits and impairments:  Decreased coordination, Decreased range of motion, Increased fascial restricitons, Decreased endurance, Increased muscle spasms, Pain, Decreased activity tolerance, Decreased strength, Decreased mobility  Visit Diagnosis: Muscle weakness (generalized)  Other lack of coordination  Chronic low back pain without sciatica, unspecified back pain laterality  Stress incontinence (female) (female)     Problem List Patient Active Problem List   Diagnosis Date Noted   Pyelonephritis 03/21/2016   Sepsis (HCC) 03/21/2016   Hyponatremia 03/21/2016   Type 2 diabetes mellitus without complication, without long-term current use of insulin (HCC) 03/21/2016   Normocytic anemia 03/21/2016   Chronic systolic heart failure (HCC) 10/02/2015   Nonischemic cardiomyopathy (HCC)    Pulmonary hypertension (HCC)    Hyperlipidemia 09/14/2015   Essential hypertension 09/14/2015   Fibromyalgia 09/14/2015   Hypothyroidism 09/14/2015    Eulis Foster, PT 08/30/21 2:46 PM  [facility] [address] [place], [place], [address] Phone: [phone]   Fax:  [phone]  Name: Robin Owen MRN: [id number] Date of Birth: May 11, 1961

## 2021-09-04 ENCOUNTER — Encounter: Payer: Managed Care, Other (non HMO) | Admitting: Physical Therapy

## 2021-09-07 ENCOUNTER — Ambulatory Visit (HOSPITAL_COMMUNITY)
Admission: RE | Admit: 2021-09-07 | Discharge: 2021-09-07 | Disposition: A | Discharge status: Home / Self Care | Payer: Managed Care, Other (non HMO) | Source: Ambulatory Visit | Attending: Cardiology | Admitting: Cardiology

## 2021-09-07 ENCOUNTER — Encounter (HOSPITAL_COMMUNITY): Payer: Self-pay | Admitting: Cardiology

## 2021-09-07 ENCOUNTER — Ambulatory Visit (INDEPENDENT_AMBULATORY_CARE_PROVIDER_SITE_OTHER)
Admission: RE | Admit: 2021-09-07 | Discharge: 2021-09-07 | Disposition: A | Discharge status: Home / Self Care | Payer: Managed Care, Other (non HMO) | Source: Ambulatory Visit | Attending: Cardiology | Admitting: Cardiology

## 2021-09-07 VITALS — BP 118/70 | HR 70 | Wt 178.8 lb

## 2021-09-07 DIAGNOSIS — M797 Fibromyalgia: Secondary | ICD-10-CM | POA: Insufficient documentation

## 2021-09-07 DIAGNOSIS — Z9989 Dependence on other enabling machines and devices: Secondary | ICD-10-CM | POA: Diagnosis present

## 2021-09-07 DIAGNOSIS — I110 Hypertensive heart disease with heart failure: Secondary | ICD-10-CM | POA: Insufficient documentation

## 2021-09-07 DIAGNOSIS — G4733 Obstructive sleep apnea (adult) (pediatric): Secondary | ICD-10-CM | POA: Insufficient documentation

## 2021-09-07 DIAGNOSIS — E039 Hypothyroidism, unspecified: Secondary | ICD-10-CM | POA: Diagnosis not present

## 2021-09-07 DIAGNOSIS — E785 Hyperlipidemia, unspecified: Secondary | ICD-10-CM | POA: Insufficient documentation

## 2021-09-07 DIAGNOSIS — I428 Other cardiomyopathies: Secondary | ICD-10-CM | POA: Insufficient documentation

## 2021-09-07 DIAGNOSIS — E119 Type 2 diabetes mellitus without complications: Secondary | ICD-10-CM | POA: Diagnosis not present

## 2021-09-07 DIAGNOSIS — I5022 Chronic systolic (congestive) heart failure: Secondary | ICD-10-CM | POA: Diagnosis not present

## 2021-09-07 DIAGNOSIS — B379 Candidiasis, unspecified: Secondary | ICD-10-CM | POA: Insufficient documentation

## 2021-09-07 DIAGNOSIS — M2550 Pain in unspecified joint: Secondary | ICD-10-CM | POA: Diagnosis not present

## 2021-09-07 DIAGNOSIS — Z79899 Other long term (current) drug therapy: Secondary | ICD-10-CM | POA: Diagnosis not present

## 2021-09-07 LAB — BASIC METABOLIC PANEL
Anion gap: 10 (ref 5–15)
BUN: 25 mg/dL — ABNORMAL HIGH (ref 6–20)
CO2: 30 mmol/L (ref 22–32)
Calcium: 9.4 mg/dL (ref 8.9–10.3)
Chloride: 98 mmol/L (ref 98–111)
Creatinine, Ser: 1.26 mg/dL — ABNORMAL HIGH (ref 0.44–1.00)
GFR, Estimated: 50 mL/min — ABNORMAL LOW (ref >60)
Glucose, Bld: 124 mg/dL — ABNORMAL HIGH (ref 70–99)
Potassium: 4.8 mmol/L (ref 3.5–5.1)
Sodium: 138 mmol/L (ref 135–145)

## 2021-09-07 LAB — ECHOCARDIOGRAM COMPLETE
Area-P 1/2: 2.99 cm2
S' Lateral: 3.3 cm
Single Plane A4C EF: 51.3 %

## 2021-09-07 NOTE — Progress Notes (Signed)
Date:  09/07/2021   ID:  Robin Owen, DOB [birth year], MRN [id number]   Provider location: [facility] Type of Visit: Established patient  PCP:  Shade Flood, MD  Cardiologist:  Dr. Shirlee Latch  Chief Complaint: Shortness of breath   History of Present Illness: Robin Owen is a 60 y.o. female who has a past medical history of HTN, HLD, DMII, hypothyroidism and fibromyalgia (on disability), and CHF.    Admitted 11/16 through 09/17/15 with increased dyspnea. CXR with pulmonary edema. This prompted and ECHO that showed reduced EF 20%. RHC/LHC showed normal coronaries and reduced cardiac index. Cardiac MRI showed some mid-wall late gadolinium enhancement in the septum.  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 She has been lisinopril for some time. Started on coreg and lasix.  Discharge weight was 163 pounds.    She developed AKI and hyperkalemia in the setting of high dose Ibuprofen.  I asked her to stop this and started her on tramadol as needed instead.     Echo in 11/18 showed increase in EF to 50-55%.       Echo 10/24/18 LVEF 45-50%, Grade 1 DD, Mild MR, PA peak pressure 24 mm Hg. Echo in 8/21 showed EF 50-55%, basal inferior hypokinesis, normal RV.  Echo was done today and reviewed, EF 50-55%, normal RV, normal IVC.    She returns today for followup of CHF.  She is using CPAP for OSA.  She still gets fatigued walking up stairs but no dyspnea walking on flat ground. She gets peripheral edema if she tries to cut back on Lasix.  No chest pain. She has ongoing joint pain attributed to fibromyalgia.    Test/Procedures  09/15/2015 ECHO EF 20% MV mild regurgitation TV mild regurgitation   09/19/2015 CMRI 1. Severely dilated left ventricle with normal wall thickness and severe systolic dysfunction (LVEF 20-25%) with global hypokinesis and akinesis of the entire septal wall and diffuse mid wall late gadolinium enhancement.  2. Moderate RV dilatation with mild  moderate  systolic dysfunction.  3. Moderate left and right atrial dilatation.  4. Mild to moderate mitral and mild tricuspid regurgitation.  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   RHC/LHC 09/16/2015  RA 7 PCWP 29 CO 3.11 CI 1.7  Normal Cors   Echo (5/17) with EF 35-40%.    Echo (12/17) with EF 35-40%, mild LV dilation.    CPX (12/17) with RER 1.13, peak VO2 20.8, VE/VCO2 slope 30.   Echo (11/18): EF 50-55%.   Echo (12/19): LVEF 45-50%, Grade 1 DD, Mild MR, PA peak pressure 24 mm Hg  Echo (8/21): EF 50-55%, basal inferior hypokinesis, normal RV size and systolic function.   Echo (11/22): EF 50-55%, normal RV, normal IVC   Labs 09/15/2015: BNP 2354 Labs 09/16/2015: TSH 1.99 Labs 09/17/2015: K 3.8 Creatinine 0.91 Labs 09/29/2015: K 4.5 Creatinine 1.09 Labs 10/05/2015: K 5.3 Creatinine 1.21, LDL 74, HCT 37.2 Labs 1/17: SPEP negative Labs 2/17: K 3.9, creatinine 1.34, digoxin 0.7, hemoglobin 12.5 Labs 3/17: K 3.9, creatinine 1.25, hemoglobin 10 Labs 4/17: K 4.2, creatinine 1.15 Labs 5/17: K 3.8, creatinine 1.11, HCT 31.7 Labs 6/17: K 5.1, creatinine 0.98, BNP 45 Labs 12/17: K 5, creatinine 1.17, BNP 35 Labs 3/18: K 3.9, creatinine 1.07 Labs 8/18: K 4.3, creatinine 0.94 Labs 2/19: K 5.7, creatinine 1.39, Na 125, hgb 12.6, LDL 102 Labs 5/19: K 4.7, creatinine 1.12 Labs 12/19: K 4.1, creatinine 1.09 Labs 5/20:  creatinine 1.13 Labs 7/20: LDL 83, HDL 53, LFTs normal Labs 9/20: K 4.6, creatinine 1.2 Labs 2/21: K 4.6, creatinine 1.35 Labs 3/22: K 4.7, creatinine 1.18, LDL 88, HDL 45 Labs 9/22: LDL 75, K 5, creatinine 1.28  Past Medical History:  Diagnosis Date   Anemia    CHF (congestive heart failure) (HCC)    Depression    Diabetes mellitus without complication (HCC)    Fibromyalgia    Hyperlipidemia    Hypertension    Hypothyroidism      Current Outpatient Medications  Medication Sig Dispense  Refill   acetaminophen (TYLENOL) 325 MG tablet Take 650 mg by mouth every 6 (six) hours as needed for mild pain.     aspirin 81 MG EC tablet Adult Low Dose Aspirin 81 mg tablet,delayed release  Take 1 tablet every day by oral route.     atorvastatin (LIPITOR) 20 MG tablet Take 20 mg by mouth daily.     atorvastatin (LIPITOR) 20 MG tablet Take 20 mg by mouth daily.     carvedilol (COREG) 25 MG tablet TAKE 1 TABLET BY MOUTH TWICE A DAY WITH MEALS 180 tablet 1   Cholecalciferol 25 MCG (1000 UT) tablet Take 1,000 Units by mouth daily.     Continuous Blood Gluc Sensor (FREESTYLE LIBRE 2 SENSOR) MISC APPLY SENSOR AS DIRECTED EVERY 14 DAYS     CORLANOR 5 MG TABS tablet TAKE 1 TABLET BY MOUTH TWICE A DAY WITH MEALS 60 tablet 11   cyclobenzaprine (FLEXERIL) 5 MG tablet Take 1 tablet (5 mg total) by mouth in the morning and at bedtime. 60 tablet 1   ENTRESTO 49-51 MG TAKE 1 TABLET BY MOUTH TWICE A DAY 180 tablet 1   famotidine (PEPCID) 40 MG tablet Take 40 mg by mouth at bedtime.     FLUoxetine (PROZAC) 20 MG tablet Take 20 mg by mouth daily.     furosemide (LASIX) 40 MG tablet TAKE 1 TABLET BY MOUTH TWICE A DAY 180 tablet 3   gabapentin (NEURONTIN) 600 MG tablet Take 600 mg by mouth 2 (two) times daily.     glimepiride (AMARYL) 2 MG tablet Take 1 tablet twice a day as needed for glucose above 100     Insulin Degludec (TRESIBA ) Inject 24 Units into the skin daily.     Iron-FA-B Cmp-C-Biot-Probiotic (FUSION PLUS PO) Take 1 capsule by mouth daily.     levothyroxine (SYNTHROID, LEVOTHROID) 88 MCG tablet Take 88 mcg by mouth daily before breakfast.     metFORMIN (GLUCOPHAGE) 1000 MG tablet Take 1 tablet (1,000 mg total) by mouth 2 (two) times daily with a meal.     Multiple Vitamin (MULTIVITAMIN WITH MINERALS) TABS tablet Take 1 tablet by mouth daily.     NOVOTWIST 32G X 5 MM MISC Use as directed with Victozia.  2   ONETOUCH VERIO test strip Check blood glucose once daily  2   spironolactone (ALDACTONE)  25 MG tablet Take 12.5 mg by mouth daily.     traMADol (ULTRAM) 50 MG tablet Take 1 tablet (50 mg total) by mouth every 6 (six) hours as needed for moderate pain (pt takes qhs and PRN). 180 tablet 0   Urine Glucose-Ketones Test (KETO-DIASTIX) STRP 1 strip by Misc.(Non-Drug; Combo Route) route 2 (two) times daily as needed. Use to check urine for ketones with high blood glucose     Wheat Dextrin (BENEFIBER) POWD Take by mouth.     No current facility-administered medications for this encounter.  Allergies:   Macrobid [nitrofurantoin]   Social History:  The patient  reports that she has never smoked. She has never used smokeless tobacco. She reports that she does not drink alcohol and does not use drugs.   Family History:  The patient's family history includes Atrial fibrillation in her maternal grandmother and mother; Diabetes Mellitus II in her brother and father; Hypertension in her brother, father, maternal grandmother, and mother; Kidney disease in her father; Pulmonary fibrosis in her mother; Stroke in her maternal grandmother.   ROS:  Please see the history of present illness.   All other systems are personally reviewed and negative.   Exam:   BP 118/70   Pulse 70   Wt 81.1 kg (178 lb 12.8 oz)   SpO2 98%   BMI 28.86 kg/m  General: NAD Neck: No JVD, no thyromegaly or thyroid nodule.  Lungs: Clear to auscultation bilaterally with normal respiratory effort. CV: Nondisplaced PMI.  Heart regular S1/S2, no S3/S4, no murmur.  No peripheral edema.  No carotid bruit.  Normal pedal pulses.  Abdomen: Soft, nontender, no hepatosplenomegaly, no distention.  Skin: Intact without lesions or rashes.  Neurologic: Alert and oriented x 3.  Psych: Normal affect. Extremities: No clubbing or cyanosis.  HEENT: Normal.   Recent Labs: 09/07/2021: BUN 25; Creatinine, Ser 1.26; Potassium 4.8; Sodium 138  Personally reviewed   Wt Readings from Last 3 Encounters:  09/07/21 81.1 kg (178 lb 12.8 oz)   07/27/21 80.7 kg (178 lb)  02/07/21 82.3 kg (181 lb 6.4 oz)      ASSESSMENT AND PLAN:  1. Chronic systolic CHF: 09/15/2015 ECHO EF 20%. Had cMRI EF 20-25%, RV mod dilated.  cMRI (11/16) showed an area of mid-wall septal late gadolinium enhancement. NICM perhaps related to HTN versus viral myocarditis. SPEP negative.  09/16/2015 RHC/LHC coronaries ok, low cardiac index 1.7.  Repeat echo in 5/17 and again in 12/17 showed some improvement, EF 35-40%.  CPX in 12/17 showed low normal peak VO2 with no clear cardiopulmonary limitation. Echo in 11/18 showed EF up to 50-55%, echo in 12/19 showed EF 45-50%, echo in 8/21 showed EF 50-55%.  Echo today was stable with EF 50-55%.  NYHA class II symptoms chronically. She is not volume overloaded on exam but does not feel that she can decrease her Lasix dose (develops edema when this is attempted).    - Continue lasix 40 mg BID.  BMET today.  - Continue spironolactone 25 mg daily.  BMET today.  - Continue Coreg 25 mg BID - Continue Entresto 49/51 mg BID - Continue ivabradine 5 mg BID - EF out of range for ICD.   - She will followup in 6 months with APP.  2. Fibromyalgia: Disabled since 1996. No change.  3. Diabetes: Off empagliflozin due to recurrent yeast infection.  4. OSA: Now using CPAP.  5. Hyperlipidemia: Continue atorvastatin.  Signed, Marca Ancona, MD  09/07/2021  [facility] [address] [facility] [place] [address] [phone] (office) [phone] (fax)

## 2021-09-07 NOTE — Progress Notes (Signed)
  Echocardiogram 2D Echocardiogram has been performed.  Delcie Roch 09/07/2021, 10:55 AM

## 2021-09-07 NOTE — Patient Instructions (Signed)
Labs done today, your results will be available in MyChart, we will contact you for abnormal readings.  Your physician recommends that you schedule a follow-up appointment in: 6 months (May 2023), **PLEASE CALL OUR OFFICE IN MARCH TO SCHEDULE THIS APPOINTMENT  If you have any questions or concerns before your next appointment please send us a message through mychart or call our office at [phone].    TO LEAVE A MESSAGE FOR THE NURSE SELECT OPTION 2, PLEASE LEAVE A MESSAGE INCLUDING: YOUR NAME DATE OF BIRTH CALL BACK NUMBER REASON FOR CALL**this is important as we prioritize the call backs  YOU WILL RECEIVE A CALL BACK THE SAME DAY AS LONG AS YOU CALL BEFORE 4:00 PM  At the [facility], you and your health needs are our priority. As part of our continuing mission to provide you with exceptional heart care, we have created designated Provider Care Teams. These Care Teams include your primary Cardiologist (physician) and Advanced Practice Providers (APPs- Physician Assistants and Nurse Practitioners) who all work together to provide you with the care you need, when you need it.   You may see any of the following providers on your designated Care Team at your next follow up: Dr Daniel Bensimhon Dr Dalton McLean Amy Clegg, NP Brittainy Simmons, PA Jessica Milford,NP Lindsay Finch, PA Lauren Kemp, PharmD   Please be sure to bring in all your medications bottles to every appointment.    

## 2021-09-11 ENCOUNTER — Other Ambulatory Visit (HOSPITAL_COMMUNITY): Payer: Self-pay | Admitting: Cardiology

## 2021-09-11 DIAGNOSIS — I5022 Chronic systolic (congestive) heart failure: Secondary | ICD-10-CM

## 2021-10-06 ENCOUNTER — Encounter (HOSPITAL_BASED_OUTPATIENT_CLINIC_OR_DEPARTMENT_OTHER): Payer: Self-pay | Admitting: Emergency Medicine

## 2021-10-06 ENCOUNTER — Other Ambulatory Visit: Payer: Self-pay

## 2021-10-06 ENCOUNTER — Emergency Department (HOSPITAL_BASED_OUTPATIENT_CLINIC_OR_DEPARTMENT_OTHER)
Admission: EM | Admit: 2021-10-06 | Discharge: 2021-10-06 | Disposition: A | Discharge status: Home / Self Care | Payer: Managed Care, Other (non HMO) | Attending: Emergency Medicine | Admitting: Emergency Medicine

## 2021-10-06 DIAGNOSIS — Z79899 Other long term (current) drug therapy: Secondary | ICD-10-CM | POA: Diagnosis not present

## 2021-10-06 DIAGNOSIS — E119 Type 2 diabetes mellitus without complications: Secondary | ICD-10-CM | POA: Diagnosis not present

## 2021-10-06 DIAGNOSIS — I110 Hypertensive heart disease with heart failure: Secondary | ICD-10-CM | POA: Insufficient documentation

## 2021-10-06 DIAGNOSIS — N3000 Acute cystitis without hematuria: Secondary | ICD-10-CM | POA: Diagnosis not present

## 2021-10-06 DIAGNOSIS — Z7982 Long term (current) use of aspirin: Secondary | ICD-10-CM | POA: Diagnosis not present

## 2021-10-06 DIAGNOSIS — E039 Hypothyroidism, unspecified: Secondary | ICD-10-CM | POA: Insufficient documentation

## 2021-10-06 DIAGNOSIS — Z7984 Long term (current) use of oral hypoglycemic drugs: Secondary | ICD-10-CM | POA: Insufficient documentation

## 2021-10-06 DIAGNOSIS — R300 Dysuria: Secondary | ICD-10-CM | POA: Diagnosis present

## 2021-10-06 DIAGNOSIS — I5022 Chronic systolic (congestive) heart failure: Secondary | ICD-10-CM | POA: Diagnosis not present

## 2021-10-06 LAB — URINALYSIS, ROUTINE W REFLEX MICROSCOPIC
Bilirubin Urine: NEGATIVE
Glucose, UA: NEGATIVE mg/dL
Ketones, ur: NEGATIVE mg/dL
Nitrite: NEGATIVE
Protein, ur: NEGATIVE mg/dL
Specific Gravity, Urine: 1.005 (ref 1.005–1.030)
WBC, UA: >50 WBC/hpf — ABNORMAL HIGH (ref 0–5)
pH: 7 (ref 5.0–8.0)

## 2021-10-06 MED ORDER — CEPHALEXIN 500 MG PO CAPS: 500.0000 mg | ORAL_CAPSULE | Freq: Four times a day (QID) | ORAL | 0 refills | Status: AC

## 2021-10-06 NOTE — ED Provider Notes (Signed)
[facility] EMERGENCY DEPT Provider Note   CSN: [id number] Arrival date & time: 10/06/21  1201     History Chief Complaint  Patient presents with   Dysuria    Robin Owen is a 60 y.o. female.  HPI  60 year old female history of anemia, CHF, depression, diabetes, fibromyalgia, hyperlipidemia, hypertension, hypothyroidism, who presents emergency department today for evaluation of dysuria.  She states that earlier this week about 5 to 6 days ago she started experiencing dysuria.  She denies any change in frequency due to taking Lasix.  She denies any hematuria, flank pain, nausea vomiting or fevers.  She is had several UTIs in the past and this feels similar.  Past Medical History:  Diagnosis Date   Anemia    CHF (congestive heart failure) ([place])    Depression    Diabetes mellitus without complication ([place])    Fibromyalgia    Hyperlipidemia    Hypertension    Hypothyroidism     Patient Active Problem List   Diagnosis Date Noted   Pyelonephritis 03/21/2016   Sepsis ([place]) 03/21/2016   Hyponatremia 03/21/2016   Type 2 diabetes mellitus without complication, without long-term current use of insulin ([place]) 03/21/2016   Normocytic anemia 99991111   Chronic systolic heart failure ([place]) 10/02/2015   Nonischemic cardiomyopathy ([place])    Pulmonary hypertension ([place])    Hyperlipidemia 09/14/2015   Essential hypertension 09/14/2015   Fibromyalgia 09/14/2015   Hypothyroidism 09/14/2015    Past Surgical History:  Procedure Laterality Date   CARDIAC CATHETERIZATION N/A 09/16/2015   Procedure: Right/Left Heart Cath and Coronary Angiography;  Surgeon: Peter M Martinique, MD;  Location: [facility];  Service: Cardiovascular;  Laterality: N/A;   ENDOMETRIAL ABLATION  2012     OB History   No obstetric history on file.     Family History  Problem Relation Age of Onset   Pulmonary fibrosis Mother    Hypertension Mother    Atrial fibrillation Mother     Hypertension Father    Diabetes Mellitus II Father    Kidney disease Father    Diabetes Mellitus II Brother    Hypertension Brother    Atrial fibrillation Maternal Grandmother    Hypertension Maternal Grandmother    Stroke Maternal Grandmother     Social History   Tobacco Use   Smoking status: Never   Smokeless tobacco: Never  Substance Use Topics   Alcohol use: No   Drug use: No    Home Medications Prior to Admission medications   Medication Sig Start Date End Date Taking? Authorizing Provider  cephALEXin (KEFLEX) 500 MG capsule Take 1 capsule (500 mg total) by mouth 4 (four) times daily for 7 days. 10/06/21 10/13/21 Yes Cedra Villalon S, PA-C  acetaminophen (TYLENOL) 325 MG tablet Take 650 mg by mouth every 6 (six) hours as needed for mild pain.    [provider]  aspirin 81 MG EC tablet Adult Low Dose Aspirin 81 mg tablet,delayed release  Take 1 tablet every day by oral route.    [provider]  atorvastatin (LIPITOR) 20 MG tablet Take 20 mg by mouth daily. 01/08/20   [provider]  atorvastatin (LIPITOR) 20 MG tablet Take 20 mg by mouth daily.    [provider]  carvedilol (COREG) 25 MG tablet TAKE 1 TABLET BY MOUTH TWICE A DAY WITH MEALS 05/29/21   Larey Dresser, MD  Cholecalciferol 25 MCG (1000 UT) tablet Take 1,000 Units by mouth daily.  [provider]  Continuous Blood Gluc Sensor (FREESTYLE LIBRE 2 SENSOR) MISC APPLY SENSOR AS DIRECTED EVERY 14 DAYS 04/29/20   [provider]  CORLANOR 5 MG TABS tablet TAKE 1 TABLET BY MOUTH TWICE A DAY WITH MEALS 09/12/21   Laurey Morale, MD  cyclobenzaprine (FLEXERIL) 5 MG tablet Take 1 tablet (5 mg total) by mouth in the morning and at bedtime. 07/27/21   Shade Flood, MD  ENTRESTO 49-51 MG TAKE 1 TABLET BY MOUTH TWICE A DAY 01/11/21   Laurey Morale, MD  famotidine (PEPCID) 40 MG tablet Take 40 mg by mouth at bedtime. 05/10/20   [provider]  FLUoxetine  (PROZAC) 20 MG tablet Take 20 mg by mouth daily.    [provider]  furosemide (LASIX) 40 MG tablet TAKE 1 TABLET BY MOUTH TWICE A DAY 07/19/21   Laurey Morale, MD  gabapentin (NEURONTIN) 600 MG tablet Take 600 mg by mouth 2 (two) times daily.    [provider]  glimepiride (AMARYL) 2 MG tablet Take 1 tablet twice a day as needed for glucose above 100 02/19/19   [provider]  Insulin Degludec (TRESIBA ) Inject 24 Units into the skin daily.    [provider]  Iron-FA-B Cmp-C-Biot-Probiotic (FUSION PLUS PO) Take 1 capsule by mouth daily.    [provider]  levothyroxine (SYNTHROID, LEVOTHROID) 88 MCG tablet Take 88 mcg by mouth daily before breakfast.    [provider]  metFORMIN (GLUCOPHAGE) 1000 MG tablet Take 1 tablet (1,000 mg total) by mouth 2 (two) times daily with a meal. 09/17/15   Calvert Cantor, MD  Multiple Vitamin (MULTIVITAMIN WITH MINERALS) TABS tablet Take 1 tablet by mouth daily.    [provider]  NOVOTWIST 32G X 5 MM MISC Use as directed with Victozia. 08/19/15   [provider]  Lum Babe test strip Check blood glucose once daily 08/19/15   [provider]  spironolactone (ALDACTONE) 25 MG tablet Take 12.5 mg by mouth daily.    [provider]  traMADol (ULTRAM) 50 MG tablet Take 1 tablet (50 mg total) by mouth every 6 (six) hours as needed for moderate pain (pt takes qhs and PRN). 07/27/21   Shade Flood, MD  Urine Glucose-Ketones Test [facility]) STRP 1 strip by Misc.(Non-Drug; Combo Route) route 2 (two) times daily as needed. Use to check urine for ketones with high blood glucose 09/01/17   [provider]  Wheat Dextrin (BENEFIBER) POWD Take by mouth.    [provider]    Allergies    Macrobid [nitrofurantoin]  Review of Systems   Review of Systems  Constitutional:  Negative for fever.  HENT:  Negative for ear pain and sore throat.   Eyes:   Negative for visual disturbance.  Respiratory:  Negative for shortness of breath.   Cardiovascular:  Negative for chest pain.  Gastrointestinal:  Negative for abdominal pain, constipation, diarrhea, nausea and vomiting.  Genitourinary:  Positive for dysuria. Negative for hematuria.  Musculoskeletal:  Negative for back pain.  Skin:  Negative for rash.  Neurological:  Negative for headaches.  All other systems reviewed and are negative.  Physical Exam Updated Vital Signs BP 133/71   Pulse 74   Temp 98.5 F (36.9 C) (Oral)   Resp 16   Ht 5\' 6"  (1.676 m)   Wt 78.9 kg   SpO2 99%   BMI 28.08 kg/m   Physical Exam Vitals and nursing note reviewed.  Constitutional:      General: She is not in acute distress.    Appearance: She is well-developed.  HENT:     Head: Normocephalic and atraumatic.  Eyes:     Conjunctiva/sclera: Conjunctivae normal.  Cardiovascular:     Rate and Rhythm: Normal rate and regular rhythm.  Pulmonary:     Effort: Pulmonary effort is normal.     Breath sounds: Normal breath sounds.  Abdominal:     General: Bowel sounds are normal.     Palpations: Abdomen is soft.     Tenderness: There is no abdominal tenderness. There is no right CVA tenderness, left CVA tenderness, guarding or rebound.  Musculoskeletal:        General: Normal range of motion.     Cervical back: Neck supple.  Skin:    General: Skin is warm and dry.  Neurological:     Mental Status: She is alert.    ED Results / Procedures / Treatments   Labs (all labs ordered are listed, but only abnormal results are displayed) Labs Reviewed  URINALYSIS, ROUTINE W REFLEX MICROSCOPIC - Abnormal; Notable for the following components:      Result Value   Hgb urine dipstick MODERATE (*)    Leukocytes,Ua LARGE (*)    WBC, UA >50 (*)    All other components within normal limits    EKG None  Radiology No results found.  Procedures Procedures   Medications Ordered in ED Medications - No data  to display  ED Course  I have reviewed the triage vital signs and the nursing notes.  Pertinent labs & imaging results that were available during my care of the patient were reviewed by me and considered in my medical decision making (see chart for details).    MDM Rules/Calculators/A&P                          Pt has been diagnosed with a UTI. Pt is afebrile, no CVA tenderness, normotensive, and denies N/V. Pt to be dc home with antibiotics and instructions to follow up with PCP if symptoms persist.   Final Clinical Impression(s) / ED Diagnoses Final diagnoses:  Acute cystitis without hematuria    Rx / DC Orders ED Discharge Orders          Ordered    cephALEXin (KEFLEX) 500 MG capsule  4 times daily        10/06/21 [address] Klyn Kroening S, PA-C 10/06/21 Ong, [place], DO 10/06/21 1503

## 2021-10-06 NOTE — Discharge Instructions (Signed)
You were given a prescription for antibiotics. Please take the antibiotic prescription fully.   Please follow up with your primary care provider within 5-7 days for re-evaluation of your symptoms. If you do not have a primary care provider, information for a healthcare clinic has been provided for you to make arrangements for follow up care. Please return to the emergency department for any new or worsening symptoms.  [email or web address]

## 2021-10-06 NOTE — ED Triage Notes (Signed)
Pt arrives pov, ambulatory with c/o dysuria and cloudy urine x 2 days. Denies fever

## 2021-10-10 LAB — HM MAMMOGRAPHY

## 2021-10-11 ENCOUNTER — Encounter: Payer: Self-pay | Admitting: Physical Therapy

## 2021-10-11 ENCOUNTER — Ambulatory Visit: Payer: Managed Care, Other (non HMO) | Attending: Urology | Admitting: Physical Therapy

## 2021-10-11 ENCOUNTER — Other Ambulatory Visit: Payer: Self-pay

## 2021-10-11 DIAGNOSIS — R278 Other lack of coordination: Secondary | ICD-10-CM | POA: Insufficient documentation

## 2021-10-11 DIAGNOSIS — M545 Low back pain, unspecified: Secondary | ICD-10-CM | POA: Insufficient documentation

## 2021-10-11 DIAGNOSIS — G8929 Other chronic pain: Secondary | ICD-10-CM | POA: Diagnosis present

## 2021-10-11 DIAGNOSIS — N393 Stress incontinence (female) (male): Secondary | ICD-10-CM | POA: Insufficient documentation

## 2021-10-11 DIAGNOSIS — M6281 Muscle weakness (generalized): Secondary | ICD-10-CM | POA: Diagnosis not present

## 2021-10-11 NOTE — Patient Instructions (Signed)
Access Code: [email or web address] URL: [email or web address] Date: 10/11/2021 Prepared by: Eulis Foster  Exercises Supine Double Knee to Chest - 1 x daily - 7 x weekly - 1 sets - 2 reps - 30 sec hold Supine Piriformis Stretch with Leg Straight - 1 x daily - 7 x weekly - 1 sets - 2 reps - 30 sec hold Supine Pelvic Floor Stretch - 1 x daily - 7 x weekly - 1 sets - 1 reps - 30 sec hold Supine Diaphragmatic Breathing - 1 x daily - 7 x weekly - 1 sets - 10 reps Supine Bridge with Mini Swiss Ball Between Knees - 1 x daily - 7 x weekly - 1 sets - 10 reps Sit to Stand with Pelvic Floor Contraction - 1 x daily - 3 x weekly - 1 sets - 10 reps Half Kneeling Diagonal Chops with Resistance - 1 x daily - 3 x weekly - 1 sets - 10 reps Standing Quadriceps Stretch with Foot at Wall - 1 x daily - 7 x weekly - 1 sets - 10 reps - 5 sec hold  [facility] [address], [address] [place], [place] [address] Phone # [phone] Fax [phone]

## 2021-10-11 NOTE — Therapy (Signed)
[facility] @ [facility] [address] [place], [place], [address] Phone: [phone]   Fax:  [phone]  Physical Therapy Treatment  Patient Details  Name: Robin Owen MRN: [id number] Date of Birth: September 11, 1961 Referring Provider (PT): Dr. Modena Slater   Encounter Date: 10/11/2021   PT End of Session - 10/11/21 1237     Visit Number 6    Date for PT Re-Evaluation 11/05/21    Authorization Type [employer]    Authorization - Visit Number 6    Authorization - Number of Visits 30    PT Start Time 1230    PT Stop Time 1308    PT Time Calculation (min) 38 min    Activity Tolerance Patient tolerated treatment well;No increased pain    Behavior During Therapy WFL for tasks assessed/performed             Past Medical History:  Diagnosis Date   Anemia    CHF (congestive heart failure) (HCC)    Depression    Diabetes mellitus without complication (HCC)    Fibromyalgia    Hyperlipidemia    Hypertension    Hypothyroidism     Past Surgical History:  Procedure Laterality Date   CARDIAC CATHETERIZATION N/A 09/16/2015   Procedure: Right/Left Heart Cath and Coronary Angiography;  Surgeon: Peter M Swaziland, MD;  [facility]: [facility];  Service: Cardiovascular;  Laterality: N/A;   ENDOMETRIAL ABLATION  2012    There were no vitals filed for this visit.   Subjective Assessment - 10/11/21 1233     Subjective I had a urinary infection and on antibiotics. No big differenct since last visit. It helped being on the EM unit. No pelvic pain.    Patient Stated Goals not have to wear a pad all the time    Currently in Pain? No/denies                            Pelvic Floor Special Questions - 10/11/21 0001     Biofeedback sitting resting tone is 4 uv; contract 1 second is 12 uv, sitting ball squeeze with pelvic floor contraction to 9 uv for 5 seconds; lunge with pelvic floor contraction 10x each way increase  contraction to 14 uv; sit to stand with pelvic floor  contraction; 1/2 kneel on chair with red band on outside knee and she pulling the green band from the door down; stand with back against the wall and push heel into the wall with pelvic floor contraction    Biofeedback sensor type Surface   vaginal                       PT Education - 10/11/21 1313     Education Details Access Code: [email or web address]    Person(s) Educated Patient    Methods Explanation;Demonstration;Verbal cues;Handout    Comprehension Returned demonstration;Verbalized understanding              PT Short Term Goals - 08/16/21 1144       PT SHORT TERM GOAL #1   Title independent with initial HEP    Time 4    Period Weeks    Status Achieved      PT SHORT TERM GOAL #2   Title able to contract the lower abdominals without bulging them    Time 4    Period Weeks    Status Achieved  PT SHORT TERM GOAL #3   Title ability to contract the pelvic floor >/= 5 seconds with a lift    Time 4    Period Weeks    Status Achieved               PT Long Term Goals - 10/11/21 1317       PT LONG TERM GOAL #1   Title independent with advanced HEP including core strength, pelvic floor exercises,and flexibility    Time 4    Period Months    Status On-going    Target Date 08/13/21      PT LONG TERM GOAL #2   Title able to cough or sneeze without bulging down on the pelvic floor so urinary leakage is decreased >/= 75%    Time 4    Period Months    Status On-going    Target Date 08/13/21      PT LONG TERM GOAL #3   Title able to sweep and mop with correct posture to reduce strain on lumbar to reduce lumbar pain >/= 75%    Time 4    Period Months    Status On-going    Target Date 08/13/21      PT LONG TERM GOAL #4   Title able to perform bending activities like sweeping and mopping without increasing abdominal pressure to reduce urinary leakage >/= 75%    Time 4    Period Months    Status  On-going    Target Date 08/13/21                   Plan - 10/11/21 1314     Clinical Impression Statement Patient does better with activity to contract the pelvic floor compared to being in a stagnate position. Patient is only able to contract the pelvic floor for 1 seconds when in sitting but when squeezing a ball she can contract for 5 seconds. Patient is able to contract the pelvic floor in a 1/2 kneel position when a band is resisting hip adduction. Patient has not changes since last visit but she is able to contract to 12 -14 uv during exercise and before she was not above 10uv. Patient will benefit from skilled therapy to improve pelvic floor coordination to reduce leakage.    Personal Factors and Comorbidities Age;Fitness;Comorbidity 3+;Time since onset of injury/illness/exacerbation;Sex    Comorbidities Fibromyalgia; DM; Hypothyroidism    Examination-Activity Limitations Continence;Lift;Squat    Examination-Participation Restrictions Cleaning;Community Activity    Stability/Clinical Decision Making Stable/Uncomplicated    Rehab Potential Excellent    PT Frequency 1x / week   every other week   PT Duration 12 weeks    PT Treatment/Interventions ADLs/Self Care Home Management;Biofeedback;Cryotherapy;Electrical Stimulation;Iontophoresis 4mg /ml Dexamethasone;Moist Heat;Traction;Therapeutic activities;Therapeutic exercise;Neuromuscular re-education;Patient/family education;Manual techniques;Dry needling;Spinal Manipulations;Joint Manipulations    PT Next Visit Plan use the pelvic floor EMG with exercise in standing, sitting and to see which motions have best pelvic floor muscle recruitment, with sweeping, coughing; renewal    PT Home Exercise Plan Access Code:    Consulted and Agree with Plan of Care Patient             Patient will benefit from skilled therapeutic intervention in order to improve the following deficits and impairments:  Decreased coordination,  Decreased range of motion, Increased fascial restricitons, Decreased endurance, Increased muscle spasms, Pain, Decreased activity tolerance, Decreased strength, Decreased mobility  Visit Diagnosis: Muscle weakness (generalized)  Other lack of coordination  Chronic low back  pain without sciatica, unspecified back pain laterality  Stress incontinence (female) (female)     Problem List Patient Active Problem List   Diagnosis Date Noted   Pyelonephritis 03/21/2016   Sepsis (HCC) 03/21/2016   Hyponatremia 03/21/2016   Type 2 diabetes mellitus without complication, without long-term current use of insulin (HCC) 03/21/2016   Normocytic anemia 03/21/2016   Chronic systolic heart failure (HCC) 10/02/2015   Nonischemic cardiomyopathy (HCC)    Pulmonary hypertension (HCC)    Hyperlipidemia 09/14/2015   Essential hypertension 09/14/2015   Fibromyalgia 09/14/2015   Hypothyroidism 09/14/2015    Eulis Foster, PT 10/11/21 1:18 PM  [facility] @ [facility] [address] [place], [place], [address] Phone: [phone]   Fax:  [phone]  Name: Robin Owen MRN: [id number] Date of Birth: 1960-11-05

## 2021-10-12 ENCOUNTER — Encounter: Payer: Self-pay | Admitting: Family Medicine

## 2021-10-23 ENCOUNTER — Other Ambulatory Visit: Payer: Self-pay | Admitting: Family Medicine

## 2021-10-23 DIAGNOSIS — M797 Fibromyalgia: Secondary | ICD-10-CM

## 2021-10-23 NOTE — Telephone Encounter (Signed)
Patient is requesting a refill of the following medications: Requested Prescriptions   Pending Prescriptions Disp Refills   traMADol (ULTRAM) 50 MG tablet [Pharmacy Med Name: TRAMADOL HCL 50 MG TABLET] 180 tablet 0    Sig: TAKE 1 TABLET (50 MG TOTAL) BY MOUTH EVERY 6 (SIX) HOURS AS NEEDED FOR MODERATE PAIN    Date of patient request: 10/23/2021 Last office visit: 07/27/2021 Date of last refill: 07/27/2021 Last refill amount: 180 tablets Follow up time period per chart: 11/02/2021

## 2021-10-24 NOTE — Telephone Encounter (Signed)
Controlled substance database (PDMP) reviewed. No concerns appreciated.  Last filled tramadol 9/29, has appt coming up in January. Refilled Rx.

## 2021-10-25 ENCOUNTER — Encounter: Payer: Self-pay | Admitting: Family Medicine

## 2021-10-26 ENCOUNTER — Encounter: Payer: Self-pay | Admitting: Family Medicine

## 2021-11-01 ENCOUNTER — Other Ambulatory Visit: Payer: Self-pay

## 2021-11-01 ENCOUNTER — Encounter: Payer: Self-pay | Admitting: Physical Therapy

## 2021-11-01 ENCOUNTER — Ambulatory Visit: Payer: Managed Care, Other (non HMO) | Attending: Urology | Admitting: Physical Therapy

## 2021-11-01 DIAGNOSIS — R278 Other lack of coordination: Secondary | ICD-10-CM | POA: Insufficient documentation

## 2021-11-01 DIAGNOSIS — M545 Low back pain, unspecified: Secondary | ICD-10-CM | POA: Insufficient documentation

## 2021-11-01 DIAGNOSIS — N393 Stress incontinence (female) (male): Secondary | ICD-10-CM | POA: Diagnosis present

## 2021-11-01 DIAGNOSIS — G8929 Other chronic pain: Secondary | ICD-10-CM | POA: Insufficient documentation

## 2021-11-01 DIAGNOSIS — M6281 Muscle weakness (generalized): Secondary | ICD-10-CM | POA: Diagnosis present

## 2021-11-01 NOTE — Patient Instructions (Signed)
Access Code: [email or web address] URL: [email or web address] Date: 11/01/2021 Prepared by: Eulis Foster  Exercises Supine Double Knee to Chest - 1 x daily - 7 x weekly - 1 sets - 2 reps - 30 sec hold Supine Piriformis Stretch with Leg Straight - 1 x daily - 7 x weekly - 1 sets - 2 reps - 30 sec hold Supine Pelvic Floor Stretch - 1 x daily - 7 x weekly - 1 sets - 1 reps - 30 sec hold Supine Diaphragmatic Breathing - 1 x daily - 7 x weekly - 1 sets - 10 reps Supine Bridge with Mini Swiss Ball Between Knees - 1 x daily - 7 x weekly - 1 sets - 10 reps Sit to Stand with Pelvic Floor Contraction - 1 x daily - 3 x weekly - 1 sets - 10 reps Half Kneeling Diagonal Chops with Resistance - 1 x daily - 3 x weekly - 1 sets - 10 reps Deadlift With Dumbbells - 1 x daily - 3 x weekly - 1 sets - 10 reps Upright Side Lunge - 1 x daily - 3 x weekly - 2 sets - 10 reps [facility] [address] [place], [place] [address] Phone # [phone] Fax [phone]

## 2021-11-01 NOTE — Therapy (Signed)
[facility] Specialty Re[facility] @ [facility] [address] [place], [place], [address] Phone: [phone]   Fax:  [phone]  Physical Therapy Treatment  Patient Details  Name: Robin Owen MRN: [id number] Date of Birth: 03-Nov-1960 Referring Provider (PT): Dr. Modena Slater   Encounter Date: 11/01/2021   PT End of Session - 11/01/21 1152     Visit Number 7    Date for PT Re-Evaluation 12/31/21    Authorization Type [employer]    Authorization - Visit Number 7    Authorization - Number of Visits 30    PT Start Time 1145    PT Stop Time 1225    PT Time Calculation (min) 40 min    Activity Tolerance Patient tolerated treatment well;No increased pain    Behavior During Therapy WFL for tasks assessed/performed             Past Medical History:  Diagnosis Date   Anemia    CHF (congestive heart failure) (HCC)    Depression    Diabetes mellitus without complication (HCC)    Fibromyalgia    Hyperlipidemia    Hypertension    Hypothyroidism     Past Surgical History:  Procedure Laterality Date   CARDIAC CATHETERIZATION N/A 09/16/2015   Procedure: Right/Left Heart Cath and Coronary Angiography;  Surgeon: Peter M Swaziland, MD;  [facility]: [facility];  Service: Cardiovascular;  Laterality: N/A;   ENDOMETRIAL ABLATION  2012    There were no vitals filed for this visit.   Subjective Assessment - 11/01/21 1150     Subjective No pelvic pain. Feeling better after the urinary tract infection. Patient wears a 1 pad per day. The pad is pretty dry. If she waits to long before she goes to the bathroom and not able to stop it.    Patient Stated Goals not have to wear a pad all the time    Currently in Pain? No/denies    Multiple Pain Sites No                [facility] PT Assessment - 11/01/21 0001       Assessment   Medical Diagnosis R30 Dysuria; N30.20 Chronic Cystitis; N39.3 Stress incontinence; M54.5 Low back pain    Referring Provider (PT) Dr.  Modena Slater    Onset Date/Surgical Date --   2 years ago   Prior Therapy none      Precautions   Precautions Other (comment)    Precaution Comments Fibromyalgia, DM, hypothyroisism, CHF      Restrictions   Weight Bearing Restrictions No      Home Environment   Living Environment Private residence      Prior Function   Level of Independence Independent    Leisure walking videos for 30 minute several times per week; ride recumbent bike      Cognition   Overall Cognitive Status Within Functional Limits for tasks assessed      Posture/Postural Control   Posture/Postural Control No significant limitations      AROM   Lumbar Extension decreased by 50%    Lumbar - Right Rotation full    Lumbar - Left Rotation full      Strength   Right Hip Flexion 5/5    Right Hip Extension 4/5    Right Hip ABduction 4/5    Left Hip Flexion 5/5    Left Hip Extension 4/5    Left Hip ABduction 4-/5  Pelvic Floor Special Questions - 11/01/21 0001     Urinary Leakage Yes    Pad use 1 pad per day    Activities that cause leaking Other    Other activities that cause leaking leak when she is walking to the bathroom with a full bladder    Pelvic Floor Internal Exam Patient confirms identification and approves PT to assess pelvic floor and treatment    Exam Type Vaginal    Palpation Patient had some tightness along th eintoritus    Strength fair squeeze, definite lift   after manual work              [facility] Adult PT Treatment/Exercise - 11/01/21 0001       Exercises   Exercises Other Exercises    Other Exercises  standing side lunge 10 each wide with quick contraction 10x each side; Dead lift with 6# wiht verbal and tactile cues to retract the scapula and hinge at hips      Manual Therapy   Manual Therapy Internal Pelvic Floor    Internal Pelvic Floor manual work to the levator ani, sides of the introitus and alon gthe urethra sphincter wiht hip  movements                     PT Education - 11/01/21 1227     Education Details Access Code: [email or web address]    Person(s) Educated Patient    Methods Explanation;Demonstration;Verbal cues;Handout    Comprehension Returned demonstration;Verbalized understanding              PT Short Term Goals - 11/01/21 1157       PT SHORT TERM GOAL #1   Title independent with initial HEP    Time 4    Period Weeks    Status Achieved      PT SHORT TERM GOAL #2   Title able to contract the lower abdominals without bulging them    Time 4    Period Weeks    Status Achieved      PT SHORT TERM GOAL #3   Title ability to contract the pelvic floor >/= 5 seconds with a lift    Time 4    Period Weeks    Status Achieved               PT Long Term Goals - 11/01/21 1158       PT LONG TERM GOAL #1   Title independent with advanced HEP including core strength, pelvic floor exercises,and flexibility    Time 4    Period Months    Status On-going      PT LONG TERM GOAL #2   Title able to cough or sneeze without bulging down on the pelvic floor so urinary leakage is decreased >/= 75%    Time 4    Period Months    Status Achieved      PT LONG TERM GOAL #3   Title able to sweep and mop with correct posture to reduce strain on lumbar to reduce lumbar pain >/= 75%    Time 4    Period Months    Status Achieved      PT LONG TERM GOAL #4   Title able to perform bending activities like sweeping and mopping without increasing abdominal pressure to reduce urinary leakage >/= 75%    Time 4    Period Months    Status On-going      PT LONG TERM GOAL #  5   Title able to walk to the bathroom with a full bladder and not leak urine due to improved pelvic floor coordination and strength    Time 12    Period Weeks    Status New    Target Date 12/31/21                   Plan - 11/01/21 1217     Clinical Impression Statement Patient pelvic floor strength has increased to 3/5  after manual work. She had tightness in the introitus and urethra sphincter. Patient will only leak when she is walking to the bathroom with a full bladder. Her pantyliner is pretty dry. Patient has increased strength in hips. She is now able to contract the pelvic floor without bearing down or bulging her abdomen. Patient will benefit from skilled therapy to improve pelvic floor coordination to reduce leakage.    Personal Factors and Comorbidities Age;Fitness;Comorbidity 3+;Time since onset of injury/illness/exacerbation;Sex    Comorbidities Fibromyalgia; DM; Hypothyroidism    Examination-Activity Limitations Continence;Lift;Squat    Examination-Participation Restrictions Cleaning;Community Activity    Stability/Clinical Decision Making Stable/Uncomplicated    Rehab Potential Excellent    PT Frequency 1x / week   evey other week   PT Duration 12 weeks    PT Treatment/Interventions ADLs/Self Care Home Management;Biofeedback;Cryotherapy;Electrical Stimulation;Iontophoresis 4mg /ml Dexamethasone;Moist Heat;Traction;Therapeutic activities;Therapeutic exercise;Neuromuscular re-education;Patient/family education;Manual techniques;Dry needling;Spinal Manipulations;Joint Manipulations    PT Next Visit Plan pelvic floor in standing with hip flexion, lunges, one legged stance; quick contractions    PT Home Exercise Plan Access Code:    Recommended Other Services sent MD renewal    Consulted and Agree with Plan of Care Patient             Patient will benefit from skilled therapeutic intervention in order to improve the following deficits and impairments:  Decreased coordination, Decreased range of motion, Increased fascial restricitons, Decreased endurance, Increased muscle spasms, Pain, Decreased activity tolerance, Decreased strength, Decreased mobility  Visit Diagnosis: Muscle weakness (generalized) - Plan: PT plan of care cert/re-cert  Other lack of coordination - Plan: PT plan of care  cert/re-cert  Chronic low back pain without sciatica, unspecified back pain laterality - Plan: PT plan of care cert/re-cert  Stress incontinence (female) (female) - Plan: PT plan of care cert/re-cert     Problem List Patient Active Problem List   Diagnosis Date Noted   Pyelonephritis 03/21/2016   Sepsis (HCC) 03/21/2016   Hyponatremia 03/21/2016   Type 2 diabetes mellitus without complication, without long-term current use of insulin (HCC) 03/21/2016   Normocytic anemia 03/21/2016   Chronic systolic heart failure (HCC) 10/02/2015   Nonischemic cardiomyopathy (HCC)    Pulmonary hypertension (HCC)    Hyperlipidemia 09/14/2015   Essential hypertension 09/14/2015   Fibromyalgia 09/14/2015   Hypothyroidism 09/14/2015    09/16/2015, PT 11/01/21 12:35 PM  [facility] @ [facility] [address] [place], [place], [place] Phone: [phone]   Fax:  [phone]  Name: WIKTORIA HEMRICK MRN: [name] Date of Birth: [birth year]

## 2021-11-02 ENCOUNTER — Ambulatory Visit (INDEPENDENT_AMBULATORY_CARE_PROVIDER_SITE_OTHER): Payer: Managed Care, Other (non HMO) | Admitting: Family Medicine

## 2021-11-02 ENCOUNTER — Encounter: Payer: Self-pay | Admitting: Family Medicine

## 2021-11-02 VITALS — BP 130/76 | HR 74 | Temp 98.0°F | Resp 16 | Ht 66.0 in | Wt 179.4 lb

## 2021-11-02 DIAGNOSIS — N1831 Chronic kidney disease, stage 3a: Secondary | ICD-10-CM

## 2021-11-02 DIAGNOSIS — I10 Essential (primary) hypertension: Secondary | ICD-10-CM

## 2021-11-02 DIAGNOSIS — I5022 Chronic systolic (congestive) heart failure: Secondary | ICD-10-CM

## 2021-11-02 DIAGNOSIS — M797 Fibromyalgia: Secondary | ICD-10-CM

## 2021-11-02 MED ORDER — CYCLOBENZAPRINE HCL 5 MG PO TABS: 5.0000 mg | ORAL_TABLET | Freq: Two times a day (BID) | ORAL | 1 refills | Status: DC

## 2021-11-02 NOTE — Patient Instructions (Signed)
No med changes at this time. As long as your specialist rechecks your kidney function, can follow up in 6 months with me. Take Care!

## 2021-11-02 NOTE — Progress Notes (Signed)
Subjective:  Patient ID: Robin Owen, female    DOB: [birth year]  Age: 61 y.o. MRN: [id number]  CC:  Chief Complaint  Patient presents with   Fibromyalgia    Pt due for refills on tramadol and flexeril no concerns   Hypertension    Due for recheck no concerns    Diabetes    Pt reports no concerns     HPI Robin Owen presents for    Hypertension: With chronic systolic heart failure, treated by cardiology, Dr. Shirlee Latch with appointment in November.  EF 50 to 55%, stable.  NYHA class II chronically.  Continued on Lasix 40 mg twice daily, spironolactone 25 mg daily, Coreg 25 mg twice daily, Entresto 49/51 mg twice daily,ivabradine 5 mg twice daily 76-month follow-up planned. Also on CPAP for OSA.  Diabetes and hypothyroidism followed by endocrinology.  BP Readings from Last 3 Encounters:  11/02/21 130/76  10/06/21 125/81  09/07/21 118/70   Lab Results  Component Value Date   CREATININE 1.26 (H) 09/07/2021  Prior range creatinine 1.15-1.34 into 2019, GFR 44-55.  Avoiding nsaids now - had been on chronic nsaids prior with fibromyalgia, also stopped with diagnosis of CHF in 08/2015. Fasting labs with endocrinology today.   Fibromyalgia Hips, thighs, ribs, treated with tramadol as needed for pain, 50 mg twice daily, occasionally will skip morning dose.  Fluoxetine 20 mg daily for depression, gabapentin 600 mg twice daily and Flexeril 10 mg twice daily.  Symptoms were stable with that regimen in September.  Attempted 5 mg dose of Flexeril to see if just as effective with less sedation. No change in sedation on lower dose but more muscle aches - back on 10mg  dose - back to stable control. Taking tramadol BID - unable to skip morning doses recently.   Controlled substance database (PDMP) reviewed. No concerns appreciated. Tramadol last filled for #180 on 10/24/21.   History Patient Active Problem List   Diagnosis Date Noted   Pyelonephritis 03/21/2016   Sepsis (HCC) 03/21/2016    Hyponatremia 03/21/2016   Type 2 diabetes mellitus without complication, without long-term current use of insulin (HCC) 03/21/2016   Normocytic anemia 03/21/2016   Chronic systolic heart failure (HCC) 10/02/2015   Nonischemic cardiomyopathy (HCC)    Pulmonary hypertension (HCC)    Hyperlipidemia 09/14/2015   Essential hypertension 09/14/2015   Fibromyalgia 09/14/2015   Hypothyroidism 09/14/2015   Past Medical History:  Diagnosis Date   Anemia    CHF (congestive heart failure) (HCC)    Depression    Diabetes mellitus without complication (HCC)    Fibromyalgia    Hyperlipidemia    Hypertension    Hypothyroidism    Past Surgical History:  Procedure Laterality Date   CARDIAC CATHETERIZATION N/A 09/16/2015   Procedure: Right/Left Heart Cath and Coronary Angiography;  Surgeon: Peter M 09/18/2015, MD;  Location: [facility];  Service: Cardiovascular;  Laterality: N/A;   ENDOMETRIAL ABLATION  2012   Allergies  Allergen Reactions   Macrobid [Nitrofurantoin] Other (See Comments)    Fever, flu like symtoms   Prior to Admission medications   Medication Sig Start Date End Date Taking? Authorizing Provider  acetaminophen (TYLENOL) 325 MG tablet Take 650 mg by mouth every 6 (six) hours as needed for mild pain.   Yes [provider]  aspirin 81 MG EC tablet Adult Low Dose Aspirin 81 mg tablet,delayed release  Take 1 tablet every day by oral route.   Yes [provider]  atorvastatin (LIPITOR)  20 MG tablet Take 20 mg by mouth daily. 01/08/20  Yes [provider]  atorvastatin (LIPITOR) 20 MG tablet Take 20 mg by mouth daily.   Yes [provider]  carvedilol (COREG) 25 MG tablet TAKE 1 TABLET BY MOUTH TWICE A DAY WITH MEALS 05/29/21  Yes Laurey Morale, MD  Cholecalciferol 25 MCG (1000 UT) tablet Take 1,000 Units by mouth daily.   Yes [provider]  Continuous Blood Gluc Sensor (FREESTYLE LIBRE 2 SENSOR) MISC APPLY SENSOR AS DIRECTED EVERY 14  DAYS 04/29/20  Yes [provider]  CORLANOR 5 MG TABS tablet TAKE 1 TABLET BY MOUTH TWICE A DAY WITH MEALS 09/12/21  Yes Laurey Morale, MD  cyclobenzaprine (FLEXERIL) 5 MG tablet Take 1 tablet (5 mg total) by mouth in the morning and at bedtime. 07/27/21  Yes Shade Flood, MD  ENTRESTO 49-51 MG TAKE 1 TABLET BY MOUTH TWICE A DAY 01/11/21  Yes Laurey Morale, MD  famotidine (PEPCID) 40 MG tablet Take 40 mg by mouth at bedtime. 05/10/20  Yes [provider]  FLUoxetine (PROZAC) 20 MG tablet Take 20 mg by mouth daily.   Yes [provider]  furosemide (LASIX) 40 MG tablet TAKE 1 TABLET BY MOUTH TWICE A DAY 07/19/21  Yes Laurey Morale, MD  gabapentin (NEURONTIN) 600 MG tablet Take 600 mg by mouth 2 (two) times daily.   Yes [provider]  glimepiride (AMARYL) 2 MG tablet Take 1 tablet twice a day as needed for glucose above 100 02/19/19  Yes [provider]  Insulin Degludec (TRESIBA [place]) Inject 24 Units into the skin daily.   Yes [provider]  Iron-FA-B Cmp-C-Biot-Probiotic (FUSION PLUS PO) Take 1 capsule by mouth daily.   Yes [provider]  levothyroxine (SYNTHROID, LEVOTHROID) 88 MCG tablet Take 88 mcg by mouth daily before breakfast.   Yes [provider]  metFORMIN (GLUCOPHAGE) 1000 MG tablet Take 1 tablet (1,000 mg total) by mouth 2 (two) times daily with a meal. 09/17/15  Yes Rizwan, Ladell Heads, MD  Multiple Vitamin (MULTIVITAMIN WITH MINERALS) TABS tablet Take 1 tablet by mouth daily.   Yes [provider]  NOVOTWIST 32G X 5 MM MISC Use as directed with Victozia. 08/19/15  Yes [provider]  Letta Pate VERIO test strip Check blood glucose once daily 08/19/15  Yes [provider]  spironolactone (ALDACTONE) 25 MG tablet Take 12.5 mg by mouth daily.   Yes [provider]  traMADol (ULTRAM) 50 MG tablet TAKE 1 TABLET (50 MG TOTAL) BY MOUTH EVERY 6 (SIX) HOURS AS NEEDED FOR MODERATE  PAIN 10/24/21  Yes Shade Flood, MD  Urine Glucose-Ketones Test [facility]) STRP 1 strip by Misc.(Non-Drug; Combo Route) route 2 (two) times daily as needed. Use to check urine for ketones with high blood glucose 09/01/17  Yes [provider]  Wheat Dextrin (BENEFIBER) POWD Take by mouth.   Yes [provider]   Social History   Socioeconomic History   Marital status: Married    Spouse name: Not on file   Number of children: Not on file   Years of education: Not on file   Highest education level: Not on file  Occupational History   Not on file  Tobacco Use   Smoking status: Never   Smokeless tobacco: Never  Substance and Sexual Activity   Alcohol use: No   Drug use: No   Sexual activity: Yes    Birth control/protection: None  Other Topics Concern   Not on file  Social History Narrative   Not on file   Social Determinants of Health   Financial Resource Strain: Not on file  Food Insecurity: Not on file  Transportation Needs: Not on file  Physical Activity: Not on file  Stress: Not on file  Social Connections: Not on file  Intimate Partner Violence: Not on file    Review of Systems  Per HPI.  Objective:   Vitals:   11/02/21 1020  BP: 130/76  Pulse: 74  Resp: 16  Temp: 98 F (36.7 C)  TempSrc: Temporal  Weight: 179 lb 6.4 oz (81.4 kg)  Height: 5\' 6"  (1.676 m)     Physical Exam Constitutional:      General: She is not in acute distress.    Appearance: Normal appearance. She is well-developed.  HENT:     Head: Normocephalic and atraumatic.  Cardiovascular:     Rate and Rhythm: Normal rate.  Pulmonary:     Effort: Pulmonary effort is normal.  Neurological:     Mental Status: She is alert and oriented to person, place, and time.  Psychiatric:        Mood and Affect: Mood normal.       Assessment & Plan:  Robin Owen is a 61 y.o. female . Essential hypertension with chronic systolic CHF  -Tolerating current med regimen,  recent follow-up with cardiology noted.  No med changes.  BP stable in office.  Fibromyalgia  -Unfortunately not able to tolerate low-dose Flexeril.  We will continue 10 mg dosing along with tramadol, gabapentin, fluoxetine at current doses.  Okay to refill meds for the next 6 months, then follow-up.  CKD, stage IIIA Likely related to prior NSAID use, some variation in readings but overall stable.  Continue to avoid nephrotoxins, planned lab work with endocrinology today.  Maintenance of hydration.  Recheck in 6 months.  No orders of the defined types were placed in this encounter.  There are no Patient Instructions on file for this visit.    Signed,   41, MD [facility] 11/02/21 10:47 AM

## 2021-11-15 ENCOUNTER — Ambulatory Visit: Payer: Managed Care, Other (non HMO) | Admitting: Physical Therapy

## 2021-11-15 ENCOUNTER — Encounter: Payer: Self-pay | Admitting: Physical Therapy

## 2021-11-15 ENCOUNTER — Other Ambulatory Visit: Payer: Self-pay

## 2021-11-15 DIAGNOSIS — R278 Other lack of coordination: Secondary | ICD-10-CM

## 2021-11-15 DIAGNOSIS — M6281 Muscle weakness (generalized): Secondary | ICD-10-CM

## 2021-11-15 DIAGNOSIS — N393 Stress incontinence (female) (male): Secondary | ICD-10-CM

## 2021-11-15 NOTE — Therapy (Signed)
[facility] [address] [place], [place], [address] Phone: [phone]   Fax:  [phone]  Physical Therapy Treatment  Patient Details  Name: Robin Owen MRN: [id number] Date of Birth: 21-May-1961 Referring Provider (PT): Dr. Modena Slater   Encounter Date: 11/15/2021   PT End of Session - 11/15/21 1227     Visit Number 8    Date for PT Re-Evaluation 12/31/21    Authorization Type [employer]    Authorization - Visit Number 2    Authorization - Number of Visits 30    PT Start Time 1228    PT Stop Time 1310    PT Time Calculation (min) 42 min    Activity Tolerance Patient tolerated treatment well;No increased pain    Behavior During Therapy WFL for tasks assessed/performed             Past Medical History:  Diagnosis Date   Anemia    CHF (congestive heart failure) (HCC)    Depression    Diabetes mellitus without complication (HCC)    Fibromyalgia    Hyperlipidemia    Hypertension    Hypothyroidism     Past Surgical History:  Procedure Laterality Date   CARDIAC CATHETERIZATION N/A 09/16/2015   Procedure: Right/Left Heart Cath and Coronary Angiography;  Surgeon: Peter M [place], MD;  Location: [facility];  Service: Cardiovascular;  Laterality: N/A;   ENDOMETRIAL ABLATION  2012    There were no vitals filed for this visit.   Subjective Assessment - 11/15/21 1228     Subjective AFter last visit I leaked urine more and the next day it was back to normal.    Patient Stated Goals not have to wear a pad all the time    Currently in Pain? No/denies    Multiple Pain Sites No                            Pelvic Floor Special Questions - 11/15/21 0001     Urinary Leakage Yes    Pad use 1 pad per day    Activities that cause leaking Coughing;Sneezing               [facility] Adult PT Treatment/Exercise - 11/15/21 0001       Lumbar Exercises: Standing   Other Standing Lumbar  Exercises standing jump with pelvic floor contraction 10x; stand on the power plate and contract pelvic floor for 5 sec and do quick flicks followed by staning on one leg; dead lift with 3# in each hand and pelvic floor contraction 10x;    Other Standing Lumbar Exercises Bird dog against wall with quick hip flexion 10x each side; side lunge holding green band quickly 10x each side      Lumbar Exercises: Seated   Other Seated Lumbar Exercises sit in lunge position then go into standing pushing a 3# weight into the air 10x each side      Knee/Hip Exercises: Machines for Strengthening   Hip Cybex bilateral hip abduction 40# and hip extension 40# 10x each leg                     PT Education - 11/15/21 1257     Education Details Access Code: [email or web address]    Person(s) Educated Patient    Methods Explanation;Demonstration;Verbal cues;Handout    Comprehension Returned demonstration;Verbalized understanding  PT Short Term Goals - 11/01/21 1157       PT SHORT TERM GOAL #1   Title independent with initial HEP    Time 4    Period Weeks    Status Achieved      PT SHORT TERM GOAL #2   Title able to contract the lower abdominals without bulging them    Time 4    Period Weeks    Status Achieved      PT SHORT TERM GOAL #3   Title ability to contract the pelvic floor >/= 5 seconds with a lift    Time 4    Period Weeks    Status Achieved               PT Long Term Goals - 11/15/21 1314       PT LONG TERM GOAL #1   Title independent with advanced HEP including core strength, pelvic floor exercises,and flexibility    Time 4    Period Months    Status On-going      PT LONG TERM GOAL #2   Title able to cough or sneeze without bulging down on the pelvic floor so urinary leakage is decreased >/= 75%    Time 4    Period Months    Status Achieved      PT LONG TERM GOAL #3   Title able to sweep and mop with correct posture to reduce strain on lumbar to  reduce lumbar pain >/= 75%    Time 4    Period Months    Status Achieved    Target Date 08/13/21      PT LONG TERM GOAL #4   Title able to perform bending activities like sweeping and mopping without increasing abdominal pressure to reduce urinary leakage >/= 75%    Time 4    Period Months    Status On-going      PT LONG TERM GOAL #5   Title able to walk to the bathroom with a full bladder and not leak urine due to improved pelvic floor coordination and strength    Time 12    Period Weeks    Status On-going                   Plan - 11/15/21 1309     Clinical Impression Statement Patient is only leaking urine when she coughs or sneezes. She is not having the UTI feeling anymore. Patient will wear a pad just in case due to her not being comfortable without it. Patient HEP has been advanced to incorporate more quick flick contractions. Patient will benefit from skilled therapy to improve pelvic floor coordination to reduce leakage.    Personal Factors and Comorbidities Age;Fitness;Comorbidity 3+;Time since onset of injury/illness/exacerbation;Sex    Comorbidities Fibromyalgia; DM; Hypothyroidism    Examination-Activity Limitations Continence;Lift;Squat    Examination-Participation Restrictions Cleaning;Community Activity    Stability/Clinical Decision Making Stable/Uncomplicated    Rehab Potential Excellent    PT Frequency 1x / week   every other week   PT Duration 12 weeks    PT Treatment/Interventions ADLs/Self Care Home Management;Biofeedback;Cryotherapy;Electrical Stimulation;Iontophoresis 4mg /ml Dexamethasone;Moist Heat;Traction;Therapeutic activities;Therapeutic exercise;Neuromuscular re-education;Patient/family education;Manual techniques;Dry needling;Spinal Manipulations;Joint Manipulations    PT Next Visit Plan pelvic floor in standing with hip flexion, lunges, one legged stance; quick contractions; pwer plate    PT Home Exercise Plan Access Code: [email or web address]     Recommended Other Services MD signed all notes    Consulted and Agree with  Plan of Care Patient             Patient will benefit from skilled therapeutic intervention in order to improve the following deficits and impairments:  Decreased coordination, Decreased range of motion, Increased fascial restricitons, Decreased endurance, Increased muscle spasms, Pain, Decreased activity tolerance, Decreased strength, Decreased mobility  Visit Diagnosis: Muscle weakness (generalized)  Other lack of coordination  Stress incontinence (female) (female)     Problem List Patient Active Problem List   Diagnosis Date Noted   Pyelonephritis 03/21/2016   Sepsis (HCC) 03/21/2016   Hyponatremia 03/21/2016   Type 2 diabetes mellitus without complication, without long-term current use of insulin (HCC) 03/21/2016   Normocytic anemia 03/21/2016   Chronic systolic heart failure (HCC) 10/02/2015   Nonischemic cardiomyopathy (HCC)    Pulmonary hypertension (HCC)    Hyperlipidemia 09/14/2015   Essential hypertension 09/14/2015   Fibromyalgia 09/14/2015   Hypothyroidism 09/14/2015    Eulis Foster, PT 11/15/21 1:16 PM  [facility] [address] [place], [place], [address] Phone: [phone]   Fax:  [phone]  Name: DELLA SCRIVENER MRN: [id number] Date of Birth: Feb 22, 1961

## 2021-11-15 NOTE — Patient Instructions (Signed)
Access Code: [email or web address] URL: [email or web address] Date: 11/15/2021 Prepared by: Eulis Foster  Exercises Supine Double Knee to Chest - 1 x daily - 7 x weekly - 1 sets - 2 reps - 30 sec hold Supine Piriformis Stretch with Leg Straight - 1 x daily - 7 x weekly - 1 sets - 2 reps - 30 sec hold Supine Pelvic Floor Stretch - 1 x daily - 7 x weekly - 1 sets - 1 reps - 30 sec hold Supine Diaphragmatic Breathing - 1 x daily - 7 x weekly - 1 sets - 10 reps Supine Bridge with Mini Swiss Ball Between Knees - 1 x daily - 7 x weekly - 1 sets - 10 reps Sit to Stand with Pelvic Floor Contraction - 1 x daily - 3 x weekly - 1 sets - 10 reps Upright Side Lunge - 1 x daily - 3 x weekly - 2 sets - 10 reps Jumping with Pelvic Floor Contraction - 1 x daily - 2 x weekly - 1 sets - 10 reps Standing Heel Raise with Hip and Knee Flexion at Wall - 1 x daily - 3 x weekly - 2 sets - 10 reps Standing Deadlift with Barbell - Knees Straight - 1 x daily - 3 x weekly - 2 sets - 10 reps [facility] [address] [place], [place] [address] Phone # [phone] Fax [phone]

## 2021-11-17 ENCOUNTER — Encounter: Payer: Self-pay | Admitting: Family Medicine

## 2021-11-17 ENCOUNTER — Other Ambulatory Visit: Payer: Self-pay

## 2021-11-17 DIAGNOSIS — M797 Fibromyalgia: Secondary | ICD-10-CM

## 2021-11-17 MED ORDER — CYCLOBENZAPRINE HCL 10 MG PO TABS: 10.0000 mg | ORAL_TABLET | Freq: Two times a day (BID) | ORAL | 1 refills | Status: DC

## 2021-11-20 ENCOUNTER — Other Ambulatory Visit (HOSPITAL_COMMUNITY): Payer: Self-pay | Admitting: Cardiology

## 2021-11-29 ENCOUNTER — Encounter: Payer: Self-pay | Admitting: Physical Therapy

## 2021-11-29 ENCOUNTER — Other Ambulatory Visit: Payer: Self-pay

## 2021-11-29 ENCOUNTER — Ambulatory Visit: Payer: Managed Care, Other (non HMO) | Attending: Urology | Admitting: Physical Therapy

## 2021-11-29 DIAGNOSIS — N393 Stress incontinence (female) (male): Secondary | ICD-10-CM | POA: Diagnosis present

## 2021-11-29 DIAGNOSIS — R278 Other lack of coordination: Secondary | ICD-10-CM | POA: Diagnosis present

## 2021-11-29 DIAGNOSIS — M545 Low back pain, unspecified: Secondary | ICD-10-CM | POA: Diagnosis present

## 2021-11-29 DIAGNOSIS — G8929 Other chronic pain: Secondary | ICD-10-CM | POA: Insufficient documentation

## 2021-11-29 DIAGNOSIS — M6281 Muscle weakness (generalized): Secondary | ICD-10-CM | POA: Diagnosis not present

## 2021-11-29 NOTE — Patient Instructions (Signed)
Access Code: [email or web address] URL: [email or web address] Date: 11/29/2021 Prepared by: Eulis Foster  Exercises Standing Pelvic Floor Contraction - 2 x daily - 7 x weekly - 1 sets - 10 reps - 10 sec hold [facility] [address] [place], [place] [address] Phone # [phone] Fax [phone]

## 2021-11-29 NOTE — Therapy (Signed)
[facility] @ [place], [place], [address] Phone: [phone]   Fax:  [phone]  Physical Therapy Treatment  Patient Details  Name: Robin Owen MRN: [id number] Date of Birth: 12-01-1960 Referring Provider (PT): Dr. Link Snuffer   Encounter Date: 11/29/2021   PT End of Session - 11/29/21 1311     Visit Number 9    Date for PT Re-Evaluation 12/31/21    Authorization Type [employer]    Authorization - Visit Number 3    Authorization - Number of Visits 30    PT Start Time [id number]    PT Stop Time [id number]    PT Time Calculation (min) 38 min    Activity Tolerance Patient tolerated treatment well;No increased pain    Behavior During Therapy WFL for tasks assessed/performed             Past Medical History:  Diagnosis Date   Anemia    CHF (congestive heart failure) ([place])    Depression    Diabetes mellitus without complication ([place])    Fibromyalgia    Hyperlipidemia    Hypertension    Hypothyroidism     Past Surgical History:  Procedure Laterality Date   CARDIAC CATHETERIZATION N/A 09/16/2015   Procedure: Right/Left Heart Cath and Coronary Angiography;  Surgeon: Peter M Martinique, MD;  Location: [facility];  Service: Cardiovascular;  Laterality: N/A;   ENDOMETRIAL ABLATION  2012    There were no vitals filed for this visit.   Subjective Assessment - 11/29/21 1231     Subjective I feel okay from last visit. Pads the same from last time.    Patient Stated Goals not have to wear a pad all the time    Currently in Pain? No/denies    Multiple Pain Sites No                [facility] PT Assessment - 11/29/21 0001       Assessment   Medical Diagnosis R30 Dysuria; N30.20 Chronic Cystitis; N39.3 Stress incontinence; M54.5 Low back pain    Referring Provider (PT) Dr. Link Snuffer    Onset Date/Surgical Date --   2 years ago   Prior Therapy none      Precautions   Precautions Other (comment)     Precaution Comments Fibromyalgia, DM, hypothyroisism, CHF      Restrictions   Weight Bearing Restrictions No      [place] residence      Prior Function   Level of Independence Independent    Leisure walking videos for 30 minute several times per week; ride recumbent bike      Cognition   Overall Cognitive Status Within Functional Limits for tasks assessed                           [facility] Adult PT Treatment/Exercise - 11/29/21 0001       Self-Care   Self-Care Other Self-Care Comments    Other Self-Care Comments  she is to contract the pelvic floor while walking to the bathroom and contract several times holding for 10 seconds so she is not waiting for full gush of urine      Lumbar Exercises: Standing   Functional Squats 20 reps;1 second    Functional Squats Limitations holding 5# whle working on technique, pushing buttocks back and keeping head up whiel engage the abdominals  Forward Lunge 10 reps;1 second   10x each leg   Forward Lunge Limitations holding 5# wt, verbal cue to contract the pelvic floor    Other Standing Lumbar Exercises tower side stepping wth 5# with verbal cues to work the core and engage the obliques; standing pelvic floor contraction holding for 10 seconds with tactile cues to not lift the rib cage then 5 quick contractions    Other Standing Lumbar Exercises step up on the round BOSU ball 15 times each side with pelvic floor contraction      Knee/Hip Exercises: Machines for Strengthening   Other Machine power plate with mini squats, stand on one leg while bringing leg outward and tactile cues to engage the abdomen, hip extension, and marching on stretch mode                     PT Education - 11/29/21 1308     Education Details Access Code: [email or web address]; walking and contract the pelvic floor as she is walking to the bathroom    Person(s) Educated Patient    Methods Explanation;Verbal  cues;Handout    Comprehension Returned demonstration;Verbalized understanding              PT Short Term Goals - 11/01/21 1157       PT SHORT TERM GOAL #1   Title independent with initial HEP    Time 4    Period Weeks    Status Achieved      PT SHORT TERM GOAL #2   Title able to contract the lower abdominals without bulging them    Time 4    Period Weeks    Status Achieved      PT SHORT TERM GOAL #3   Title ability to contract the pelvic floor >/= 5 seconds with a lift    Time 4    Period Weeks    Status Achieved               PT Long Term Goals - 11/29/21 1256       PT LONG TERM GOAL #1   Title independent with advanced HEP including core strength, pelvic floor exercises,and flexibility    Time 4    Period Months    Status On-going    Target Date 08/13/21      PT LONG TERM GOAL #2   Title able to cough or sneeze without bulging down on the pelvic floor so urinary leakage is decreased >/= 75%    Baseline leaks sometimes    Time 4    Period Months    Status On-going      PT LONG TERM GOAL #3   Title able to sweep and mop with correct posture to reduce strain on lumbar to reduce lumbar pain >/= 75%    Time 4    Period Months    Status Achieved      PT LONG TERM GOAL #4   Title able to perform bending activities like sweeping and mopping without increasing abdominal pressure to reduce urinary leakage >/= 75%    Time 4    Period Months    Status Achieved      PT LONG TERM GOAL #5   Title able to walk to the bathroom with a full bladder and not leak urine due to improved pelvic floor coordination and strength    Baseline leaks when she waits too long    Time 12    Period Weeks  Status On-going                   Plan - 11/29/21 1312     Clinical Impression Statement Patient is not leaking urine with mopping and sweeping. She leaks with coughing and sneezing and when she is walking to the bathroom with a full bladder. She is doing  pelvic floor strengthening in standing due to the most frequent urinary leakage in this position. Patient needs verbal cues on her squat technique to lift her chest and sit back more. She will raise her rib cage when trying to lift her pelvic floor in standing. Patient will benefit from skilled therapy to improve pelvic floor coordination to reduce leakage.    Personal Factors and Comorbidities Age;Fitness;Comorbidity 3+;Time since onset of injury/illness/exacerbation;Sex    Comorbidities Fibromyalgia; DM; Hypothyroidism    Examination-Activity Limitations Continence;Lift;Squat    Examination-Participation Restrictions Cleaning;Community Activity    Stability/Clinical Decision Making Stable/Uncomplicated    Rehab Potential Excellent    PT Frequency 1x / week   everyother week.   PT Duration 12 weeks    PT Treatment/Interventions ADLs/Self Care Home Management;Biofeedback;Cryotherapy;Electrical Stimulation;Iontophoresis 4mg /ml Dexamethasone;Moist Heat;Traction;Therapeutic activities;Therapeutic exercise;Neuromuscular re-education;Patient/family education;Manual techniques;Dry needling;Spinal Manipulations;Joint Manipulations    PT Next Visit Plan work on pelvic floor in standing    PT Home Exercise Plan Access Code: [email or web address] and Agree with Plan of Care Patient             Patient will benefit from skilled therapeutic intervention in order to improve the following deficits and impairments:  Decreased coordination, Decreased range of motion, Increased fascial restricitons, Decreased endurance, Increased muscle spasms, Pain, Decreased activity tolerance, Decreased strength, Decreased mobility  Visit Diagnosis: Muscle weakness (generalized)  Other lack of coordination  Stress incontinence (female) (female)  Chronic low back pain without sciatica, unspecified back pain laterality     Problem List Patient Active Problem List   Diagnosis Date Noted   Pyelonephritis 03/21/2016    Sepsis ([place]) 03/21/2016   Hyponatremia 03/21/2016   Type 2 diabetes mellitus without complication, without long-term current use of insulin ([place]) 03/21/2016   Normocytic anemia [id number]   Chronic systolic heart failure ([place]) 10/02/2015   Nonischemic cardiomyopathy ([place])    Pulmonary hypertension ([place])    Hyperlipidemia 09/14/2015   Essential hypertension 09/14/2015   Fibromyalgia 09/14/2015   Hypothyroidism 09/14/2015    Earlie Counts, PT 11/29/21 1:18 PM  [facility] @ [place], [place], [address] Phone: [phone]   Fax:  [phone]  Name: Robin Owen MRN: [id number] Date of Birth: [birth year]

## 2021-12-08 ENCOUNTER — Other Ambulatory Visit (HOSPITAL_COMMUNITY): Payer: Self-pay | Admitting: Cardiology

## 2021-12-13 ENCOUNTER — Other Ambulatory Visit: Payer: Self-pay

## 2021-12-13 ENCOUNTER — Encounter: Payer: Self-pay | Admitting: Physical Therapy

## 2021-12-13 ENCOUNTER — Ambulatory Visit: Payer: Managed Care, Other (non HMO) | Admitting: Physical Therapy

## 2021-12-13 DIAGNOSIS — N393 Stress incontinence (female) (male): Secondary | ICD-10-CM

## 2021-12-13 DIAGNOSIS — M545 Low back pain, unspecified: Secondary | ICD-10-CM

## 2021-12-13 DIAGNOSIS — M6281 Muscle weakness (generalized): Secondary | ICD-10-CM | POA: Diagnosis not present

## 2021-12-13 DIAGNOSIS — R278 Other lack of coordination: Secondary | ICD-10-CM

## 2021-12-13 DIAGNOSIS — G8929 Other chronic pain: Secondary | ICD-10-CM

## 2021-12-13 NOTE — Therapy (Signed)
[facility] @ [facility] [address] [place], [place], [address] Phone: [phone]   Fax:  [phone]  Physical Therapy Treatment  Patient Details  Name: Robin Owen MRN: [id number] Date of Birth: 27-Jul-1961 Referring Provider (PT): Dr. Modena Slater   Encounter Date: 12/13/2021   PT End of Session - 12/13/21 1232     Visit Number 10    Date for PT Re-Evaluation 12/31/21    Authorization Type [employer]    Authorization - Visit Number 4    Authorization - Number of Visits 30    PT Start Time 1230    PT Stop Time 1308    PT Time Calculation (min) 38 min    Activity Tolerance Patient tolerated treatment well;No increased pain    Behavior During Therapy WFL for tasks assessed/performed             Past Medical History:  Diagnosis Date   Anemia    CHF (congestive heart failure) ([facility])    Depression    Diabetes mellitus without complication (H[facility])    Fibromyalgia    Hyperlipidemia    Hypertension    Hypothyroidism     Past Surgical History:  Procedure Laterality Date   CARDIAC CATHETERIZATION N/A 09/16/2015   Procedure: Right/Left Heart Cath and Coronary Angiography;  Surgeon: Peter M Swaziland, MD;  [facility];  Service: Cardiovascular;  Laterality: N/A;   ENDOMETRIAL ABLATION  2012    There were no vitals filed for this visit.   Subjective Assessment - 12/13/21 1232     Subjective Things are doing better. No pain.    Patient Stated Goals not have to wear a pad all the time    Currently in Pain? No/denies                               [facility] Adult PT Treatment/Exercise - 12/13/21 0001       Lumbar Exercises: Aerobic   Nustep level 3 for 6 minutes while assessing patient      Lumbar Exercises: Standing   Functional Squats 20 reps;1 second    Functional Squats Limitations on round side of the BOSU ball with pelvic floor contraction    Forward Lunge 5 reps   2 times on each leg    Forward Lunge Limitations pushing blue band forward and contract the pelvic floor    Other Standing Lumbar Exercises tower side stepping wth 5# with verbal cues to work the core and engage the obliques; side lunge with holding 5# 10 x each way    Other Standing Lumbar Exercises step up on the round BOSU ball 15 times each side with pelvic floor contraction; standing pulling v band donw on tower at #20 wiht tactile cues ot keep knees bend and engage the abdominals and pelvic floor      Knee/Hip Exercises: Machines for Strengthening   Other Machine power plate with mini squats, stand on one leg while bringing leg outward and tactile cues to engage the abdomen, hip extension, and marching on stretch mode; squats, side lunges, heel and toe raises, wt. shift front and back                       PT Short Term Goals - 11/01/21 1157       PT SHORT TERM GOAL #1   Title independent with initial HEP    Time  4    Period Weeks    Status Achieved      PT SHORT TERM GOAL #2   Title able to contract the lower abdominals without bulging them    Time 4    Period Weeks    Status Achieved      PT SHORT TERM GOAL #3   Title ability to contract the pelvic floor >/= 5 seconds with a lift    Time 4    Period Weeks    Status Achieved               PT Long Term Goals - 11/29/21 1256       PT LONG TERM GOAL #1   Title independent with advanced HEP including core strength, pelvic floor exercises,and flexibility    Time 4    Period Months    Status On-going    Target Date 08/13/21      PT LONG TERM GOAL #2   Title able to cough or sneeze without bulging down on the pelvic floor so urinary leakage is decreased >/= 75%    Baseline leaks sometimes    Time 4    Period Months    Status On-going      PT LONG TERM GOAL #3   Title able to sweep and mop with correct posture to reduce strain on lumbar to reduce lumbar pain >/= 75%    Time 4    Period Months    Status Achieved       PT LONG TERM GOAL #4   Title able to perform bending activities like sweeping and mopping without increasing abdominal pressure to reduce urinary leakage >/= 75%    Time 4    Period Months    Status Achieved      PT LONG TERM GOAL #5   Title able to walk to the bathroom with a full bladder and not leak urine due to improved pelvic floor coordination and strength    Baseline leaks when she waits too long    Time 12    Period Weeks    Status On-going                   Plan - 12/13/21 1303     Clinical Impression Statement Patient reports her urinary leakage has improved by 85% since her initial eval. Patient is working on building her endurance and strength while in therapy to reduce her leakage. She is doing her HEP. Patient is able to walk to the bathroom better when she has to urinate. Patient takes Dewaine Conger so she has to urinate often. Patient needed verbal cues to go slowly with her exercise so she has control. Patient will benefit from skilled therapy to improve pelvic floor coordination to reduce leakage.    Personal Factors and Comorbidities Age;Fitness;Comorbidity 3+;Time since onset of injury/illness/exacerbation;Sex    Comorbidities Fibromyalgia; DM; Hypothyroidism    Examination-Activity Limitations Continence;Lift;Squat    Examination-Participation Restrictions Cleaning;Community Activity    Stability/Clinical Decision Making Stable/Uncomplicated    Rehab Potential Excellent    PT Frequency 1x / week   every other week   PT Duration 12 weeks    PT Treatment/Interventions ADLs/Self Care Home Management;Biofeedback;Cryotherapy;Electrical Stimulation;Iontophoresis 4mg /ml Dexamethasone;Moist Heat;Traction;Therapeutic activities;Therapeutic exercise;Neuromuscular re-education;Patient/family education;Manual techniques;Dry needling;Spinal Manipulations;Joint Manipulations    PT Next Visit Plan work on pelvic floor in standing; discharge if doing well    PT Home Exercise Plan  Access Code: [email or web address]    Consulted and Agree with Plan of  Care Patient             Patient will benefit from skilled therapeutic intervention in order to improve the following deficits and impairments:  Decreased coordination, Decreased range of motion, Increased fascial restricitons, Decreased endurance, Increased muscle spasms, Pain, Decreased activity tolerance, Decreased strength, Decreased mobility  Visit Diagnosis: Muscle weakness (generalized)  Other lack of coordination  Stress incontinence (female) (female)  Chronic low back pain without sciatica, unspecified back pain laterality     Problem List Patient Active Problem List   Diagnosis Date Noted   Pyelonephritis 03/21/2016   Sepsis (HCC) 03/21/2016   Hyponatremia 03/21/2016   Type 2 diabetes mellitus without complication, without long-term current use of insulin (HCC) 03/21/2016   Normocytic anemia 03/21/2016   Chronic systolic heart failure (HCC) 10/02/2015   Nonischemic cardiomyopathy (HCC)    Pulmonary hypertension (HCC)    Hyperlipidemia 09/14/2015   Essential hypertension 09/14/2015   Fibromyalgia 09/14/2015   Hypothyroidism 09/14/2015    Eulis Foster, PT 12/13/21 1:16 PM  [facility] @ [facility] [address] [place], [place], [address] Phone: [phone]   Fax:  [phone]  Name: Robin Owen MRN: [id number] Date of Birth: 1961/08/05

## 2021-12-27 ENCOUNTER — Other Ambulatory Visit: Payer: Self-pay

## 2021-12-27 ENCOUNTER — Encounter: Payer: Self-pay | Admitting: Physical Therapy

## 2021-12-27 ENCOUNTER — Ambulatory Visit: Payer: Managed Care, Other (non HMO) | Attending: Urology | Admitting: Physical Therapy

## 2021-12-27 DIAGNOSIS — M545 Low back pain, unspecified: Secondary | ICD-10-CM | POA: Insufficient documentation

## 2021-12-27 DIAGNOSIS — M6281 Muscle weakness (generalized): Secondary | ICD-10-CM | POA: Insufficient documentation

## 2021-12-27 DIAGNOSIS — R278 Other lack of coordination: Secondary | ICD-10-CM | POA: Diagnosis present

## 2021-12-27 DIAGNOSIS — G8929 Other chronic pain: Secondary | ICD-10-CM | POA: Insufficient documentation

## 2021-12-27 DIAGNOSIS — N393 Stress incontinence (female) (male): Secondary | ICD-10-CM | POA: Diagnosis present

## 2021-12-27 NOTE — Therapy (Signed)
[facility] @ [place], [place], [address] Phone: [phone]   Fax:  [phone]  Physical Therapy Treatment  Patient Details  Name: Robin Owen MRN: [id number] Date of Birth: 1961/07/16 Referring Provider (PT): Dr. Link Snuffer   Encounter Date: 12/27/2021   PT End of Session - 12/27/21 1231     Visit Number 11    Date for PT Re-Evaluation 12/31/21    Authorization Type [employer]    Authorization - Visit Number 5    Authorization - Number of Visits 30    PT Start Time 6503    PT Stop Time 1300    PT Time Calculation (min) 30 min    Activity Tolerance Patient tolerated treatment well;No increased pain    Behavior During Therapy WFL for tasks assessed/performed             Past Medical History:  Diagnosis Date   Anemia    CHF (congestive heart failure) ([employer])    Depression    Diabetes mellitus without complication ([place])    Fibromyalgia    Hyperlipidemia    Hypertension    Hypothyroidism     Past Surgical History:  Procedure Laterality Date   CARDIAC CATHETERIZATION N/A 09/16/2015   Procedure: Right/Left Heart Cath and Coronary Angiography;  Surgeon: Peter M Martinique, MD;  Location: [facility];  Service: Cardiovascular;  Laterality: N/A;   ENDOMETRIAL ABLATION  2012    There were no vitals filed for this visit.       [facility] PT Assessment - 12/27/21 0001       Assessment   Medical Diagnosis R30 Dysuria; N30.20 Chronic Cystitis; N39.3 Stress incontinence; M54.5 Low back pain    Referring Provider (PT) Dr. Link Snuffer    Onset Date/Surgical Date --   2 years ago   Prior Therapy none      Precautions   Precautions Other (comment)    Precaution Comments Fibromyalgia, DM, hypothyroisism, CHF      Restrictions   Weight Bearing Restrictions No      [place] residence      Prior Function   Level of Independence Independent    Leisure walking  videos for 30 minute several times per week; ride recumbent bike      Cognition   Overall Cognitive Status Within Functional Limits for tasks assessed      Posture/Postural Control   Posture/Postural Control No significant limitations      Strength   Right Hip Flexion 5/5    Right Hip Extension 4/5    Right Hip ABduction 4+/5    Left Hip Flexion 5/5    Left Hip Extension 4/5    Left Hip ABduction 4+/5                        Pelvic Floor Special Questions - 12/27/21 0001     Urinary Leakage No    Pad use wears a pad just in case    Strength fair squeeze, definite lift   after manual work              [facility] Adult PT Treatment/Exercise - 12/27/21 0001       Lumbar Exercises: Stretches   Double Knee to Chest Stretch 1 rep;30 seconds    Piriformis Stretch Right;Left;1 rep;30 seconds      Lumbar Exercises: Aerobic   Nustep level 4 for 6 minutes while  assessing patient      Lumbar Exercises: Standing   Side Lunge 10 reps;1 second   right, left   Side Lunge Limitations with pelvic floor contraction    Other Standing Lumbar Exercises standing with hands on wall and lifing opposite arm and leg with pelvic floor contraction 10x each side    Other Standing Lumbar Exercises wall squat holding for 5 seconds 10x with pelvic floor contraction; standing ump with pelvic floor contraction 5 x      Lumbar Exercises: Supine   Bridge with [employer] 15 reps    Bridge with Ball Squeeze Limitations with pelvic floor contraction                     PT Education - 12/27/21 1302     Education Details Access Code: [email or web address]    Person(s) Educated Patient    Methods Explanation;Demonstration;Verbal cues;Handout    Comprehension Returned demonstration;Verbalized understanding              PT Short Term Goals - 12/27/21 1232       PT SHORT TERM GOAL #1   Title independent with initial HEP    Time 4    Period Weeks    Status Achieved    Target Date  05/11/21      PT SHORT TERM GOAL #2   Title able to contract the lower abdominals without bulging them    Time 4    Period Weeks    Status Achieved      PT SHORT TERM GOAL #3   Title ability to contract the pelvic floor >/= 5 seconds with a lift    Time 4    Status Achieved               PT Long Term Goals - 12/27/21 1233       PT LONG TERM GOAL #1   Title independent with advanced HEP including core strength, pelvic floor exercises,and flexibility    Time 4    Period Months    Status Achieved      PT LONG TERM GOAL #2   Title able to cough or sneeze without bulging down on the pelvic floor so urinary leakage is decreased >/= 75%    Time 4    Period Months    Status Achieved      PT LONG TERM GOAL #3   Title able to sweep and mop with correct posture to reduce strain on lumbar to reduce lumbar pain >/= 75%    Time 4    Period Months    Status Achieved      PT LONG TERM GOAL #4   Title able to perform bending activities like sweeping and mopping without increasing abdominal pressure to reduce urinary leakage >/= 75%    Time 4    Period Months    Status Achieved      PT LONG TERM GOAL #5   Title able to walk to the bathroom with a full bladder and not leak urine due to improved pelvic floor coordination and strength    Time 12    Period Weeks    Status Achieved                   Plan - 12/27/21 1242     Clinical Impression Statement Patient goes to the bathroom every 30 minutes in the morning due to taking her lasix medication and coffee. Patient wears a pad just in  case. She may leak in a rare time. Pelvic floor strength 3/5. Patient had increased in hip strength. She is able to walk to the bathroom due to her not waiting too long when she has the urge. Most nights she is not getting up in the middle of the night.    Personal Factors and Comorbidities Age;Fitness;Comorbidity 3+;Time since onset of injury/illness/exacerbation;Sex    Comorbidities  Fibromyalgia; DM; Hypothyroidism    Examination-Activity Limitations Continence;Lift;Squat    Examination-Participation Restrictions Cleaning;Community Activity    Stability/Clinical Decision Making Stable/Uncomplicated    Rehab Potential Excellent    PT Treatment/Interventions ADLs/Self Care Home Management;Biofeedback;Cryotherapy;Electrical Stimulation;Iontophoresis 64m/ml Dexamethasone;Moist Heat;Traction;Therapeutic activities;Therapeutic exercise;Neuromuscular re-education;Patient/family education;Manual techniques;Dry needling;Spinal Manipulations;Joint Manipulations    PT Next Visit Plan Discharge to HEP    PT Home Exercise Plan Access Code: [email or web address]   Consulted and Agree with Plan of Care Patient             Patient will benefit from skilled therapeutic intervention in order to improve the following deficits and impairments:  Decreased coordination, Decreased range of motion, Increased fascial restricitons, Decreased endurance, Increased muscle spasms, Pain, Decreased activity tolerance, Decreased strength, Decreased mobility  Visit Diagnosis: Muscle weakness (generalized)  Other lack of coordination  Stress incontinence (female) (female)  Chronic low back pain without sciatica, unspecified back pain laterality     Problem List Patient Active Problem List   Diagnosis Date Noted   Pyelonephritis 03/21/2016   Sepsis ([place] 03/21/2016   Hyponatremia 03/21/2016   Type 2 diabetes mellitus without complication, without long-term current use of insulin ([place] 03/21/2016   Normocytic anemia 091/79/1505  Chronic systolic heart failure ([place] 10/02/2015   Nonischemic cardiomyopathy ([place]    Pulmonary hypertension ([place]    Hyperlipidemia 09/14/2015   Essential hypertension 09/14/2015   Fibromyalgia 09/14/2015   Hypothyroidism 09/14/2015    CEarlie Counts PT 12/27/21 1:05 PM   [facility] [address]Phone: [phone]  Fax:  [phone] Name: SPEG FIFERMRN: [id number] of Birth: 1Jun 28, 1962 PHYSICAL THERAPY DISCHARGE SUMMARY  Visits from Start of Care: 11  Current functional level related to goals / functional outcomes: See above.    Remaining deficits: See above.    Education / Equipment: HEP   Patient agrees to discharge. Patient goals were met. Patient is being discharged due to meeting the stated rehab goals. Thank you for the referral. CEarlie Counts PT 12/27/21 1:06 PM

## 2021-12-27 NOTE — Patient Instructions (Signed)
Access Code: [email or web address] ?URL: [email or web address] ?Date: 12/27/2021 ?Prepared by: Eulis Foster ? ?Exercises ?Supine Piriformis Stretch with Leg Straight - 1 x daily - 7 x weekly - 1 sets - 2 reps - 30 sec hold ?Supine Pelvic Floor Stretch - 1 x daily - 7 x weekly - 1 sets - 1 reps - 30 sec hold ?Supine Bridge with Mini Swiss Ball Between Knees - 1 x daily - 7 x weekly - 1 sets - 10 reps ?Upright Side Lunge - 1 x daily - 3 x weekly - 2 sets - 10 reps ?Jumping with Pelvic Floor Contraction - 1 x daily - 2 x weekly - 1 sets - 10 reps ?Standing Heel Raise with Hip and Knee Flexion at Wall - 1 x daily - 3 x weekly - 2 sets - 10 reps ?Standing Deadlift with Barbell - Knees Straight - 1 x daily - 3 x weekly - 2 sets - 10 reps ?Standing Pelvic Floor Contraction - 2 x daily - 7 x weekly - 1 sets - 10 reps - 10 sec hold ?Wall Quarter Squat - 1 x daily - 3 x weekly - 1 sets - 10 reps - 5 sec hold ?[facility] ?[address], [address] ?[place], [place] [address] ?Phone # [phone] ?Fax [phone] ? ?

## 2022-01-09 ENCOUNTER — Encounter: Payer: Self-pay | Admitting: Family Medicine

## 2022-01-09 DIAGNOSIS — M797 Fibromyalgia: Secondary | ICD-10-CM

## 2022-01-09 LAB — HM PAP SMEAR: HM Pap smear: NORMAL

## 2022-01-09 MED ORDER — FLUOXETINE HCL 20 MG PO TABS: 20.0000 mg | ORAL_TABLET | Freq: Every day | ORAL | 1 refills | Status: DC

## 2022-01-09 NOTE — Telephone Encounter (Signed)
See last visit. We discussed fluoxetine and plan to continue same doses of meds. I will send in refill for next 6 months.  ?

## 2022-01-09 NOTE — Telephone Encounter (Signed)
Pt is requesting you to prescibe medication previously given by Dr Altheimer  ? ?Please advise  ?

## 2022-01-16 ENCOUNTER — Other Ambulatory Visit: Payer: Self-pay | Admitting: Family Medicine

## 2022-01-16 DIAGNOSIS — M797 Fibromyalgia: Secondary | ICD-10-CM

## 2022-01-17 NOTE — Telephone Encounter (Signed)
Discussed 11/02/2021. ?Controlled substance database (PDMP) reviewed. No concerns appreciated.  Last filled for #180 on 10/24/2021.  Refill ordered ? ?

## 2022-01-23 ENCOUNTER — Encounter: Payer: Self-pay | Admitting: Family Medicine

## 2022-01-28 ENCOUNTER — Other Ambulatory Visit: Payer: Self-pay | Admitting: Family Medicine

## 2022-01-28 DIAGNOSIS — M797 Fibromyalgia: Secondary | ICD-10-CM

## 2022-01-30 NOTE — Telephone Encounter (Signed)
Patient is requesting a refill of the following medications: ?Requested Prescriptions  ? ?Pending Prescriptions Disp Refills  ? cyclobenzaprine (FLEXERIL) 10 MG tablet [Pharmacy Med Name: CYCLOBENZAPRINE 10 MG TABLET] 60 tablet 1  ?  Sig: Take 1 tablet (10 mg total) by mouth in the morning and at bedtime.  ? ? ?Date of patient request: 01/28/22 ?Last office visit: 11/20/21 ?Date of last refill: 11/22/21 ?Last refill amount: 60x1R ? ?

## 2022-02-05 ENCOUNTER — Encounter: Payer: Self-pay | Admitting: Family Medicine

## 2022-02-06 LAB — HM DIABETES EYE EXAM

## 2022-03-27 ENCOUNTER — Encounter (HOSPITAL_COMMUNITY): Payer: Self-pay | Admitting: Cardiology

## 2022-03-27 ENCOUNTER — Ambulatory Visit (HOSPITAL_COMMUNITY)
Admission: RE | Admit: 2022-03-27 | Discharge: 2022-03-27 | Disposition: A | Discharge status: Home / Self Care | Payer: Managed Care, Other (non HMO) | Source: Ambulatory Visit | Attending: Cardiology | Admitting: Cardiology

## 2022-03-27 VITALS — BP 128/80 | HR 62 | Wt 173.4 lb

## 2022-03-27 DIAGNOSIS — E119 Type 2 diabetes mellitus without complications: Secondary | ICD-10-CM | POA: Insufficient documentation

## 2022-03-27 DIAGNOSIS — E039 Hypothyroidism, unspecified: Secondary | ICD-10-CM | POA: Insufficient documentation

## 2022-03-27 DIAGNOSIS — I5022 Chronic systolic (congestive) heart failure: Secondary | ICD-10-CM

## 2022-03-27 DIAGNOSIS — Z794 Long term (current) use of insulin: Secondary | ICD-10-CM | POA: Diagnosis not present

## 2022-03-27 DIAGNOSIS — E785 Hyperlipidemia, unspecified: Secondary | ICD-10-CM | POA: Diagnosis not present

## 2022-03-27 DIAGNOSIS — G4733 Obstructive sleep apnea (adult) (pediatric): Secondary | ICD-10-CM | POA: Diagnosis not present

## 2022-03-27 DIAGNOSIS — Z8249 Family history of ischemic heart disease and other diseases of the circulatory system: Secondary | ICD-10-CM | POA: Insufficient documentation

## 2022-03-27 DIAGNOSIS — M797 Fibromyalgia: Secondary | ICD-10-CM | POA: Insufficient documentation

## 2022-03-27 DIAGNOSIS — Z79899 Other long term (current) drug therapy: Secondary | ICD-10-CM | POA: Diagnosis not present

## 2022-03-27 DIAGNOSIS — Z9989 Dependence on other enabling machines and devices: Secondary | ICD-10-CM | POA: Insufficient documentation

## 2022-03-27 DIAGNOSIS — I110 Hypertensive heart disease with heart failure: Secondary | ICD-10-CM | POA: Diagnosis present

## 2022-03-27 LAB — BASIC METABOLIC PANEL
Anion gap: 9 (ref 5–15)
BUN: 23 mg/dL — ABNORMAL HIGH (ref 6–20)
CO2: 29 mmol/L (ref 22–32)
Calcium: 9.5 mg/dL (ref 8.9–10.3)
Chloride: 98 mmol/L (ref 98–111)
Creatinine, Ser: 1.39 mg/dL — ABNORMAL HIGH (ref 0.44–1.00)
GFR, Estimated: 44 mL/min — ABNORMAL LOW (ref >60)
Glucose, Bld: 156 mg/dL — ABNORMAL HIGH (ref 70–99)
Potassium: 4.5 mmol/L (ref 3.5–5.1)
Sodium: 136 mmol/L (ref 135–145)

## 2022-03-27 NOTE — Progress Notes (Signed)
Date:  03/27/2022   ID:  Robin Owen, DOB October 06, 1961, MRN [id number]   Provider location: [facility] Type of Visit: Established patient  PCP:  Shade Flood, MD  Cardiologist:  Dr. Shirlee Latch  Chief Complaint: Shortness of breath   History of Present Illness: Robin Owen is a 61 y.o. female who has a past medical history of HTN, HLD, DMII, hypothyroidism and fibromyalgia (on disability), and CHF.    Admitted 11/16 through 09/17/15 with increased dyspnea. CXR with pulmonary edema. This prompted and ECHO that showed reduced EF 20%. RHC/LHC showed normal coronaries and reduced cardiac index. Cardiac MRI showed some mid-wall late gadolinium enhancement in the septum.  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 She has been lisinopril for some time. Started on coreg and lasix.  Discharge weight was 163 pounds.    She developed AKI and hyperkalemia in the setting of high dose Ibuprofen.  I asked her to stop this and started her on tramadol as needed instead.     Echo in 11/18 showed increase in EF to 50-55%.       Echo 10/24/18 LVEF 45-50%, Grade 1 DD, Mild MR, PA peak pressure 24 mm Hg. Echo in 8/21 showed EF 50-55%, basal inferior hypokinesis, normal RV.  Echo in 11/22 showed EF 50-55%, normal RV, normal IVC.    She returns today for followup of CHF.  She is using CPAP for OSA.  She is stable symptomatically.  No dyspnea walking on flat ground.  She is short of breath with stairs and inclines.  No chest pain.  No lightheadedness.  She has ongoing joint pain attributed to fibromyalgia.    ECG (personally reviewed): NSR, nonspecific T wave changes.   Test/Procedures  09/15/2015 ECHO EF 20% MV mild regurgitation TV mild regurgitation   09/19/2015 CMRI 1. Severely dilated left ventricle with normal wall thickness and severe systolic dysfunction (LVEF 20-25%) with global hypokinesis and akinesis of the entire septal wall and diffuse mid wall late gadolinium enhancement.  2.  Moderate RV dilatation with mild  moderate systolic dysfunction.  3. Moderate left and right atrial dilatation.  4. Mild to moderate mitral and mild tricuspid regurgitation.  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   RHC/LHC 09/16/2015  RA 7 PCWP 29 CO 3.11 CI 1.7  Normal Cors   Echo (5/17) with EF 35-40%.    Echo (12/17) with EF 35-40%, mild LV dilation.    CPX (12/17) with RER 1.13, peak VO2 20.8, VE/VCO2 slope 30.   Echo (11/18): EF 50-55%.   Echo (12/19): LVEF 45-50%, Grade 1 DD, Mild MR, PA peak pressure 24 mm Hg  Echo (8/21): EF 50-55%, basal inferior hypokinesis, normal RV size and systolic function.   Echo (11/22): EF 50-55%, normal RV, normal IVC   Labs 09/15/2015: BNP 2354 Labs 09/16/2015: TSH 1.99 Labs 09/17/2015: K 3.8 Creatinine 0.91 Labs 09/29/2015: K 4.5 Creatinine 1.09 Labs 10/05/2015: K 5.3 Creatinine 1.21, LDL 74, HCT 37.2 Labs 1/17: SPEP negative Labs 2/17: K 3.9, creatinine 1.34, digoxin 0.7, hemoglobin 12.5 Labs 3/17: K 3.9, creatinine 1.25, hemoglobin 10 Labs 4/17: K 4.2, creatinine 1.15 Labs 5/17: K 3.8, creatinine 1.11, HCT 31.7 Labs 6/17: K 5.1, creatinine 0.98, BNP 45 Labs 12/17: K 5, creatinine 1.17, BNP 35 Labs 3/18: K 3.9, creatinine 1.07 Labs 8/18: K 4.3, creatinine 0.94 Labs 2/19: K 5.7, creatinine 1.39, Na 125, hgb 12.6, LDL 102 Labs 5/19: K 4.7, creatinine 1.12 Labs  12/19: K 4.1, creatinine 1.09 Labs 5/20: creatinine 1.13 Labs 7/20: LDL 83, HDL 53, LFTs normal Labs 9/20: K 4.6, creatinine 1.2 Labs 2/21: K 4.6, creatinine 1.35 Labs 3/22: K 4.7, creatinine 1.18, LDL 88, HDL 45 Labs 9/22: LDL 75, K 5, creatinine 1.28 Labs 1/23: LDL 74, K 5.3, creatinine 1.4  Past Medical History:  Diagnosis Date   Anemia    CHF (congestive heart failure) (HCC)    Depression    Diabetes mellitus without complication (HCC)    Fibromyalgia    Hyperlipidemia    Hypertension     Hypothyroidism      Current Outpatient Medications  Medication Sig Dispense Refill   acetaminophen (TYLENOL) 325 MG tablet Take 650 mg by mouth every 6 (six) hours as needed for mild pain.     aspirin 81 MG EC tablet Adult Low Dose Aspirin 81 mg tablet,delayed release  Take 1 tablet every day by oral route.     atorvastatin (LIPITOR) 20 MG tablet Take 20 mg by mouth daily.     carvedilol (COREG) 25 MG tablet TAKE 1 TABLET BY MOUTH TWICE A DAY WITH MEALS 180 tablet 1   Cholecalciferol 25 MCG (1000 UT) tablet Take 1,000 Units by mouth daily.     Continuous Blood Gluc Sensor (FREESTYLE LIBRE 2 SENSOR) MISC APPLY SENSOR AS DIRECTED EVERY 14 DAYS     CORLANOR 5 MG TABS tablet TAKE 1 TABLET BY MOUTH TWICE A DAY WITH MEALS 60 tablet 11   cyclobenzaprine (FLEXERIL) 10 MG tablet TAKE 1 TABLET (10 MG TOTAL) BY MOUTH IN THE MORNING AND AT BEDTIME 60 tablet 1   ENTRESTO 49-51 MG TAKE 1 TABLET BY MOUTH TWICE A DAY 180 tablet 1   famotidine (PEPCID) 40 MG tablet Take 40 mg by mouth at bedtime.     FLUoxetine (PROZAC) 20 MG tablet Take 1 tablet (20 mg total) by mouth daily. 90 tablet 1   furosemide (LASIX) 40 MG tablet TAKE 1 TABLET BY MOUTH TWICE A DAY 180 tablet 3   gabapentin (NEURONTIN) 600 MG tablet Take 600 mg by mouth 2 (two) times daily.     Insulin Degludec (TRESIBA [place]) Inject 20 Units into the skin daily.     Iron-FA-B Cmp-C-Biot-Probiotic (FUSION PLUS PO) Take 1 capsule by mouth daily.     levothyroxine (SYNTHROID) 112 MCG tablet Take 112 mcg by mouth daily before breakfast.     metFORMIN (GLUCOPHAGE) 1000 MG tablet Take 1 tablet (1,000 mg total) by mouth 2 (two) times daily with a meal.     Multiple Vitamin (MULTIVITAMIN WITH MINERALS) TABS tablet Take 1 tablet by mouth daily.     NOVOTWIST 32G X 5 MM MISC Use as directed with Victozia.  2   ONETOUCH VERIO test strip Check blood glucose once daily  2   spironolactone (ALDACTONE) 25 MG tablet TAKE 1 TABLET BY MOUTH EVERY DAY 90 tablet 1    traMADol (ULTRAM) 50 MG tablet TAKE 1 TABLET (50 MG TOTAL) BY MOUTH EVERY 6 (SIX) HOURS AS NEEDED FOR MODERATE PAIN 180 tablet 0   Urine Glucose-Ketones Test (KETO-DIASTIX) STRP 1 strip by Misc.(Non-Drug; Combo Route) route 2 (two) times daily as needed. Use to check urine for ketones with high blood glucose     No current facility-administered medications for this encounter.    Allergies:   Macrobid [nitrofurantoin]   Social History:  The patient  reports that she has never smoked. She has never used smokeless tobacco. She reports that she  does not drink alcohol and does not use drugs.   Family History:  The patient's family history includes Atrial fibrillation in her maternal grandmother and mother; Diabetes Mellitus II in her brother and father; Hypertension in her brother, father, maternal grandmother, and mother; Kidney disease in her father; Pulmonary fibrosis in her mother; Stroke in her maternal grandmother.   ROS:  Please see the history of present illness.   All other systems are personally reviewed and negative.   Exam:   BP 128/80   Pulse 62   Wt 78.7 kg (173 lb 6.4 oz)   SpO2 97%   BMI 27.99 kg/m  General: NAD Neck: No JVD, no thyromegaly or thyroid nodule.  Lungs: Clear to auscultation bilaterally with normal respiratory effort. CV: Nondisplaced PMI.  Heart regular S1/S2, no S3/S4, no murmur.  No peripheral edema.  No carotid bruit.  Normal pedal pulses.  Abdomen: Soft, nontender, no hepatosplenomegaly, no distention.  Skin: Intact without lesions or rashes.  Neurologic: Alert and oriented x 3.  Psych: Normal affect. Extremities: No clubbing or cyanosis.  HEENT: Normal.   Recent Labs: 03/27/2022: BUN 23; Creatinine, Ser 1.39; Potassium 4.5; Sodium 136  Personally reviewed   Wt Readings from Last 3 Encounters:  03/27/22 78.7 kg (173 lb 6.4 oz)  11/02/21 81.4 kg (179 lb 6.4 oz)  10/06/21 78.9 kg (174 lb)      ASSESSMENT AND PLAN:  1. Chronic systolic CHF:  09/15/2015 ECHO EF 20%. Had cMRI EF 20-25%, RV mod dilated.  cMRI (11/16) showed an area of mid-wall septal late gadolinium enhancement. NICM perhaps related to HTN versus viral myocarditis. SPEP negative.  09/16/2015 RHC/LHC coronaries ok, low cardiac index 1.7.  Repeat echo in 5/17 and again in 12/17 showed some improvement, EF 35-40%.  CPX in 12/17 showed low normal peak VO2 with no clear cardiopulmonary limitation. Echo in 11/18 showed EF up to 50-55%, echo in 12/19 showed EF 45-50%, echo in 8/21 showed EF 50-55%.  Echo in 11/22 was stable with EF 50-55%.  NYHA class II symptoms chronically. She is not volume overloaded on exam but does not feel that she can decrease her Lasix dose (develops edema when this is attempted).    - Continue lasix 40 mg BID.  BMET today.  - Continue spironolactone 25 mg daily.  BMET today.  - Continue Coreg 25 mg BID - Continue Entresto 49/51 mg BID - Continue ivabradine 5 mg BID - EF out of range for ICD.   - She will followup in 6 months with APP.  2. Fibromyalgia: Disabled since 1996. No change.  3. Diabetes: Off empagliflozin due to recurrent yeast infection.  4. OSA: Now using CPAP.  5. Hyperlipidemia: Continue atorvastatin.  Signed, Marca Ancona, MD  03/27/2022  [facility] [address] [facility] [place] [address] [phone] (office) [phone] (fax)

## 2022-03-27 NOTE — Patient Instructions (Signed)
There has been no changes to your medications.  Labs done today, your results will be available in MyChart, we will contact you for abnormal readings.   Your physician recommends that you schedule a follow-up appointment in: 6 months   If you have any questions or concerns before your next appointment please send us a message through mychart or call our office at [phone].    TO LEAVE A MESSAGE FOR THE NURSE SELECT OPTION 2, PLEASE LEAVE A MESSAGE INCLUDING: YOUR NAME DATE OF BIRTH CALL BACK NUMBER REASON FOR CALL**this is important as we prioritize the call backs  YOU WILL RECEIVE A CALL BACK THE SAME DAY AS LONG AS YOU CALL BEFORE 4:00 PM  At the [facility], you and your health needs are our priority. As part of our continuing mission to provide you with exceptional heart care, we have created designated Provider Care Teams. These Care Teams include your primary Cardiologist (physician) and Advanced Practice Providers (APPs- Physician Assistants and Nurse Practitioners) who all work together to provide you with the care you need, when you need it.   You may see any of the following providers on your designated Care Team at your next follow up: Dr Daniel Bensimhon Dr Dalton McLean Amy Clegg, NP Brittainy Simmons, PA Jessica Milford,NP Lindsay Finch, PA Lauren Kemp, PharmD   Please be sure to bring in all your medications bottles to every appointment.    

## 2022-04-01 ENCOUNTER — Other Ambulatory Visit: Payer: Self-pay | Admitting: Family Medicine

## 2022-04-01 DIAGNOSIS — M797 Fibromyalgia: Secondary | ICD-10-CM

## 2022-04-02 NOTE — Telephone Encounter (Signed)
Patient is requesting a refill of the following medications: Requested Prescriptions   Pending Prescriptions Disp Refills   cyclobenzaprine (FLEXERIL) 10 MG tablet [Pharmacy Med Name: CYCLOBENZAPRINE 10 MG TABLET] 60 tablet 1    Sig: TAKE 1 TABLET (10 MG TOTAL) BY MOUTH IN THE MORNING AND AT BEDTIME    Date of patient request: 04/02/22 Last office visit: 11/02/21 Date of last refill: 01/30/22 Last refill amount: 60 Follow up time period per chart: 6 mon

## 2022-04-03 NOTE — Telephone Encounter (Signed)
Medication discussed 11/02/2021.  We will continue same regimen.  Refill of Flexeril ordered.

## 2022-04-05 ENCOUNTER — Encounter: Payer: Self-pay | Admitting: Family Medicine

## 2022-04-05 NOTE — Telephone Encounter (Signed)
Pt notes you were to take over prescribing Rx Fusion plus from previous Dr

## 2022-04-06 MED ORDER — FUSION PLUS PO CAPS: 1.0000 | ORAL_CAPSULE | Freq: Every day | ORAL | 11 refills | Status: DC

## 2022-04-06 NOTE — Telephone Encounter (Signed)
Ordered

## 2022-04-08 ENCOUNTER — Other Ambulatory Visit (HOSPITAL_COMMUNITY): Payer: Self-pay | Admitting: Cardiology

## 2022-04-20 ENCOUNTER — Other Ambulatory Visit: Payer: Self-pay | Admitting: Family Medicine

## 2022-04-20 DIAGNOSIS — M797 Fibromyalgia: Secondary | ICD-10-CM

## 2022-05-10 ENCOUNTER — Ambulatory Visit (INDEPENDENT_AMBULATORY_CARE_PROVIDER_SITE_OTHER): Payer: Managed Care, Other (non HMO) | Admitting: Family Medicine

## 2022-05-10 ENCOUNTER — Encounter: Payer: Self-pay | Admitting: Family Medicine

## 2022-05-10 VITALS — BP 110/76 | HR 98 | Temp 98.0°F | Resp 16 | Ht 66.0 in | Wt 173.8 lb

## 2022-05-10 DIAGNOSIS — I5022 Chronic systolic (congestive) heart failure: Secondary | ICD-10-CM

## 2022-05-10 DIAGNOSIS — N1831 Chronic kidney disease, stage 3a: Secondary | ICD-10-CM

## 2022-05-10 DIAGNOSIS — M797 Fibromyalgia: Secondary | ICD-10-CM

## 2022-05-10 DIAGNOSIS — E039 Hypothyroidism, unspecified: Secondary | ICD-10-CM

## 2022-05-10 DIAGNOSIS — E1165 Type 2 diabetes mellitus with hyperglycemia: Secondary | ICD-10-CM

## 2022-05-10 DIAGNOSIS — I10 Essential (primary) hypertension: Secondary | ICD-10-CM | POA: Diagnosis not present

## 2022-05-10 DIAGNOSIS — F339 Major depressive disorder, recurrent, unspecified: Secondary | ICD-10-CM

## 2022-05-10 DIAGNOSIS — Z794 Long term (current) use of insulin: Secondary | ICD-10-CM

## 2022-05-10 MED ORDER — GABAPENTIN 600 MG PO TABS: 600.0000 mg | ORAL_TABLET | Freq: Two times a day (BID) | ORAL | 1 refills | Status: DC

## 2022-05-10 MED ORDER — FUSION PLUS PO CAPS: 1.0000 | ORAL_CAPSULE | Freq: Every day | ORAL | 3 refills | Status: DC

## 2022-05-10 NOTE — Patient Instructions (Signed)
No med changes at this time.  Follow-up in 6 months for physical.  Thanks for coming in today.  Let me know if there are questions.

## 2022-05-10 NOTE — Progress Notes (Signed)
Subjective:  Patient ID: Robin Owen, female    DOB: 1960/11/20  Age: 61 y.o. MRN: [id number]  CC:  Chief Complaint  Patient presents with   Depression   Hypertension   Hypothyroidism    HPI Robin Owen presents for   Hypertension, CHF With nonischemic cardiomyopathy, chronic systolic heart failure, cardiologist Dr. Mayford Knife, Dr. Shirlee Latch with CHF clinic, last visit May 30.  NYHA class II symptoms chronically.  Continued on Lasix 40 mg twice daily, spironolactone 25 mg daily, carvedilol 25 mg twice daily, Entresto 49/51 mg twice daily, ivabradine 5 mg twice daily.  EF out of range for ICD.  54-month follow-up planned with CHF clinic. History of CKD stage IIIa, diabetes followed by endocrinology, Dr. [name], April 14 visit. Home readings: none.  No new side effects.  No CP/dyspnea or leg swelling.  BP Readings from Last 3 Encounters:  05/10/22 110/76  03/27/22 128/80  11/02/21 130/76   Lab Results  Component Value Date   CREATININE 1.39 (H) 03/27/2022  Creat 1.43 on 11/02/21 with [facility].   Depression: With fibromyalgia.  Treated with fluoxetine 20 mg daily for depression, gabapentin 600 mg twice daily with Flexeril 10 mg twice daily for fibromyalgia symptoms.  Tramadol for breakthrough pain, 50 mg twice daily typically. About the same. Denies change in sx's or more depressed. Crafts, involved with some groups at church.  No new side effects.     05/10/2022   10:39 AM 11/02/2021   10:23 AM 07/27/2021    3:15 PM 04/04/2016    2:21 PM 12/19/2015    3:31 PM  Depression screen PHQ 2/9  Decreased Interest 1 0 0 0 1  Down, Depressed, Hopeless 1 0 0 0 1  PHQ - 2 Score 2 0 0 0 2  Altered sleeping 2 0   0  Tired, decreased energy 2 3   1   Change in appetite 1 0   0  Feeling bad or failure about yourself  1 0   1  Trouble concentrating 2 1   1   Moving slowly or fidgety/restless 0 0   0  Suicidal thoughts 0 0   0  PHQ-9 Score 10 4   5   Difficult doing work/chores      Somewhat difficult   Hypothyroidism: Lab Results  Component Value Date   TSH 1.990 09/16/2015  Taking medication daily.  Synthroid 112 mcg TSH 3.14 through [facility].  11/02/2021. No new hot or cold intolerance. No new hair or skin changes, heart palpitations or new fatigue. No new weight changes.  Managed by endocrine at [facility].     History Patient Active Problem List   Diagnosis Date Noted   Pyelonephritis 03/21/2016   Sepsis (HCC) 03/21/2016   Hyponatremia 03/21/2016   Type 2 diabetes mellitus without complication, without long-term current use of insulin (HCC) 03/21/2016   Normocytic anemia 03/21/2016   Chronic systolic heart failure (HCC) 10/02/2015   Nonischemic cardiomyopathy (HCC)    Pulmonary hypertension (HCC)    Hyperlipidemia 09/14/2015   Essential hypertension 09/14/2015   Fibromyalgia 09/14/2015   Hypothyroidism 09/14/2015   Past Medical History:  Diagnosis Date   Anemia    CHF (congestive heart failure) (HCC)    Depression    Diabetes mellitus without complication (HCC)    Fibromyalgia    Hyperlipidemia    Hypertension    Hypothyroidism    Past Surgical History:  Procedure Laterality Date   CARDIAC CATHETERIZATION N/A 09/16/2015  Procedure: Right/Left Heart Cath and Coronary Angiography;  Surgeon: Peter M Swaziland, MD;  Location: [facility] INVASIVE CV LAB;  Service: Cardiovascular;  Laterality: N/A;   ENDOMETRIAL ABLATION  2012   Allergies  Allergen Reactions   Macrobid [Nitrofurantoin] Other (See Comments)    Fever, flu like symtoms   Prior to Admission medications   Medication Sig Start Date End Date Taking? Authorizing Provider  acetaminophen (TYLENOL) 325 MG tablet Take 650 mg by mouth every 6 (six) hours as needed for mild pain.   Yes [provider]  aspirin 81 MG EC tablet Adult Low Dose Aspirin 81 mg tablet,delayed release  Take 1 tablet every day by oral route.   Yes [provider]  atorvastatin (LIPITOR)  20 MG tablet Take 20 mg by mouth daily. 01/08/20  Yes [provider]  atorvastatin (LIPITOR) 40 MG tablet TAKE 1 TABLET BY MOUTH EVERY DAY 04/09/22  Yes Laurey Morale, MD  carvedilol (COREG) 25 MG tablet TAKE 1 TABLET BY MOUTH TWICE A DAY WITH MEALS 12/11/21  Yes Laurey Morale, MD  Cholecalciferol 25 MCG (1000 UT) tablet Take 1,000 Units by mouth daily.   Yes [provider]  Continuous Blood Gluc Sensor (FREESTYLE LIBRE 2 SENSOR) MISC APPLY SENSOR AS DIRECTED EVERY 14 DAYS 04/29/20  Yes [provider]  CORLANOR 5 MG TABS tablet TAKE 1 TABLET BY MOUTH TWICE A DAY WITH MEALS 09/12/21  Yes Laurey Morale, MD  cyclobenzaprine (FLEXERIL) 10 MG tablet TAKE 1 TABLET (10 MG TOTAL) BY MOUTH IN THE MORNING AND AT BEDTIME 04/03/22  Yes Shade Flood, MD  ENTRESTO 49-51 MG TAKE 1 TABLET BY MOUTH TWICE A DAY 11/20/21  Yes Laurey Morale, MD  famotidine (PEPCID) 40 MG tablet Take 40 mg by mouth at bedtime. 05/10/20  Yes [provider]  FLUoxetine (PROZAC) 20 MG tablet TAKE 1 TABLET BY MOUTH EVERY DAY 04/20/22  Yes Shade Flood, MD  furosemide (LASIX) 40 MG tablet TAKE 1 TABLET BY MOUTH TWICE A DAY 07/19/21  Yes Laurey Morale, MD  gabapentin (NEURONTIN) 600 MG tablet Take 600 mg by mouth 2 (two) times daily.   Yes [provider]  Insulin Degludec (TRESIBA [place]) Inject 20 Units into the skin daily.   Yes [provider]  Iron-FA-B Cmp-C-Biot-Probiotic (FUSION PLUS) CAPS Take 1 capsule by mouth daily. Take 1 capsule by mouth daily. 04/06/22  Yes Shade Flood, MD  levothyroxine (SYNTHROID) 112 MCG tablet Take 112 mcg by mouth daily before breakfast.   Yes [provider]  metFORMIN (GLUCOPHAGE) 1000 MG tablet Take 1 tablet (1,000 mg total) by mouth 2 (two) times daily with a meal. 09/17/15  Yes Calvert Cantor, MD  Multiple Vitamin (MULTIVITAMIN WITH MINERALS) TABS tablet Take 1 tablet by mouth daily.   Yes [provider]   NOVOTWIST 32G X 5 MM MISC Use as directed with Victozia. 08/19/15  Yes [provider]  Letta Pate VERIO test strip Check blood glucose once daily 08/19/15  Yes [provider]  spironolactone (ALDACTONE) 25 MG tablet TAKE 1 TABLET BY MOUTH EVERY DAY 12/11/21  Yes Laurey Morale, MD  traMADol (ULTRAM) 50 MG tablet TAKE 1 TABLET (50 MG TOTAL) BY MOUTH EVERY 6 (SIX) HOURS AS NEEDED FOR MODERATE PAIN 04/20/22  Yes Shade Flood, MD  Urine Glucose-Ketones Test [facility]) STRP 1 strip by Misc.(Non-Drug; Combo Route) route 2 (two) times daily as needed. Use to check urine for ketones with high blood  glucose 09/01/17  Yes [provider]   Social History   Socioeconomic History   Marital status: Married    Spouse name: Not on file   Number of children: Not on file   Years of education: Not on file   Highest education level: Not on file  Occupational History   Not on file  Tobacco Use   Smoking status: Never   Smokeless tobacco: Never  Substance and Sexual Activity   Alcohol use: No   Drug use: No   Sexual activity: Yes    Birth control/protection: None  Other Topics Concern   Not on file  Social History Narrative   Not on file   Social Determinants of Health   Financial Resource Strain: Not on file  Food Insecurity: Not on file  Transportation Needs: Not on file  Physical Activity: Not on file  Stress: Not on file  Social Connections: Not on file  Intimate Partner Violence: Not on file    Review of Systems Per HPI.   Objective:   Vitals:   05/10/22 1036  BP: 110/76  Pulse: 98  Resp: 16  Temp: 98 F (36.7 C)  TempSrc: Oral  SpO2: 95%  Weight: 173 lb 12.8 oz (78.8 kg)  Height: 5\' 6"  (1.676 m)     Physical Exam Vitals reviewed.  Constitutional:      Appearance: Normal appearance. She is well-developed.  HENT:     Head: Normocephalic and atraumatic.  Eyes:     Conjunctiva/sclera: Conjunctivae normal.     Pupils: Pupils are  equal, round, and reactive to light.  Neck:     Vascular: No carotid bruit.  Cardiovascular:     Rate and Rhythm: Normal rate and regular rhythm.     Heart sounds: Normal heart sounds.  Pulmonary:     Effort: Pulmonary effort is normal.     Breath sounds: Normal breath sounds.  Abdominal:     Palpations: Abdomen is soft. There is no pulsatile mass.     Tenderness: There is no abdominal tenderness.  Musculoskeletal:     Right lower leg: No edema.     Left lower leg: No edema.  Skin:    General: Skin is warm and dry.  Neurological:     Mental Status: She is alert and oriented to person, place, and time.  Psychiatric:        Mood and Affect: Mood normal.        Behavior: Behavior normal.        Assessment & Plan:  Robin Owen is a 61 y.o. female . Hypothyroidism, unspecified type  -Outside labs noted, stable on most recent testing in January and followed by endocrinology, no med changes at this time.  Essential hypertension  -Stable, recent cardiology notes reviewed, continue same regimen.  Recent labs noted including creatinine with slight elevation from previous labs here but improved from reading at [facility].  No med changes at this time.  Type 2 diabetes mellitus with hyperglycemia, with long-term current use of insulin (HCC)  -Followed by endocrinology, continue close follow-up.  Stage 3a chronic kidney disease (HCC)  -Recent labs noted as above.  Avoid NSAIDs, maintain hydration and balance with diuretics discussed.  Avoid volume depletion  Chronic systolic heart failure (HCC)  -Stable symptoms, appears euvolemic on exam.  Recent cardiology note reviewed without med changes.  Fibromyalgia - Plan: gabapentin (NEURONTIN) 600 MG tablet Depression, recurrent (HCC)  -Stable symptoms, continue same regimen including tramadol twice daily, Flexeril  twice daily, gabapentin, fluoxetine.  74-month follow-up.  Meds ordered this encounter  Medications   Iron-FA-B  Cmp-C-Biot-Probiotic (FUSION PLUS) CAPS    Sig: Take 1 capsule by mouth daily. Take 1 capsule by mouth daily.    Dispense:  90 capsule    Refill:  3   gabapentin (NEURONTIN) 600 MG tablet    Sig: Take 1 tablet (600 mg total) by mouth 2 (two) times daily.    Dispense:  180 tablet    Refill:  1   Patient Instructions  No med changes at this time.  Follow-up in 6 months for physical.  Thanks for coming in today.  Let me know if there are questions.    Signed,   Meredith Staggers, MD [facility], [facility] 05/10/22 11:20 AM

## 2022-05-15 ENCOUNTER — Telehealth: Payer: Self-pay | Admitting: *Deleted

## 2022-05-15 NOTE — Telephone Encounter (Signed)
Patient says she has been wearing for the most part and wake up sometimes and it is off her face. She states she does not have any missed calls from [facility] but she is going to call them.

## 2022-05-15 NOTE — Telephone Encounter (Signed)
Late Entry: Fax came on 05/11/22 from [facility] for this patient stating they have reached out to the patient multiple times with no response about her compliance for the last 30 days. Compliance:20% Usage: 79 hrs 49 mins  Leak:0 AHI: 0  I reached out the patient and left message to call back.

## 2022-05-17 ENCOUNTER — Encounter: Payer: Self-pay | Admitting: Family Medicine

## 2022-05-22 ENCOUNTER — Other Ambulatory Visit (HOSPITAL_COMMUNITY): Payer: Self-pay | Admitting: *Deleted

## 2022-05-22 ENCOUNTER — Encounter (HOSPITAL_COMMUNITY): Payer: Self-pay

## 2022-05-22 ENCOUNTER — Other Ambulatory Visit: Payer: Self-pay | Admitting: Family Medicine

## 2022-05-22 DIAGNOSIS — M797 Fibromyalgia: Secondary | ICD-10-CM

## 2022-05-22 MED ORDER — ENTRESTO 49-51 MG PO TABS: 1.0000 | ORAL_TABLET | Freq: Two times a day (BID) | ORAL | 3 refills | Status: DC

## 2022-06-21 ENCOUNTER — Other Ambulatory Visit (HOSPITAL_COMMUNITY): Payer: Self-pay | Admitting: Cardiology

## 2022-07-05 ENCOUNTER — Other Ambulatory Visit (HOSPITAL_COMMUNITY): Payer: Self-pay | Admitting: Cardiology

## 2022-07-18 ENCOUNTER — Telehealth: Payer: Managed Care, Other (non HMO) | Admitting: Emergency Medicine

## 2022-07-18 ENCOUNTER — Other Ambulatory Visit: Payer: Self-pay | Admitting: Lab

## 2022-07-18 ENCOUNTER — Other Ambulatory Visit: Payer: Self-pay | Admitting: Family Medicine

## 2022-07-18 DIAGNOSIS — M797 Fibromyalgia: Secondary | ICD-10-CM

## 2022-07-18 DIAGNOSIS — R300 Dysuria: Secondary | ICD-10-CM

## 2022-07-18 MED ORDER — TRAMADOL HCL 50 MG PO TABS: ORAL_TABLET | ORAL | 0 refills | Status: DC

## 2022-07-18 MED ORDER — CEPHALEXIN 500 MG PO CAPS: 500.0000 mg | ORAL_CAPSULE | Freq: Two times a day (BID) | ORAL | 0 refills | Status: DC

## 2022-07-18 NOTE — Telephone Encounter (Signed)
//  Wendie Agreste, MD //Patient is requesting a refill of the following medications: Requested Prescriptions   Pending Prescriptions Disp Refills   traMADol (ULTRAM) 50 MG tablet [Pharmacy Med Name: TRAMADOL HCL 50 MG TABLET] 180 tablet 0    Sig: TAKE 1 TABLET (50 MG TOTAL) BY MOUTH EVERY 6 (SIX) HOURS AS NEEDED FOR MODERATE PAIN    Date of patient request: 07/18/2022  Last office visit: 0713/2023  Last refill amount: 180tablets

## 2022-07-18 NOTE — Progress Notes (Signed)

## 2022-07-19 ENCOUNTER — Other Ambulatory Visit: Payer: Self-pay | Admitting: Family Medicine

## 2022-07-19 ENCOUNTER — Other Ambulatory Visit: Payer: Self-pay | Admitting: Lab

## 2022-07-19 DIAGNOSIS — M797 Fibromyalgia: Secondary | ICD-10-CM

## 2022-07-19 NOTE — Telephone Encounter (Signed)
Controlled substance database reviewed.  Tramadol last filled for #180 on 04/20/2022.  Previously 01/17/2022.  Medication discussed at July 13 visit.  Refill has been ordered.

## 2022-07-20 ENCOUNTER — Other Ambulatory Visit: Payer: Self-pay | Admitting: Family Medicine

## 2022-07-20 ENCOUNTER — Encounter: Payer: Self-pay | Admitting: Family Medicine

## 2022-07-20 DIAGNOSIS — M797 Fibromyalgia: Secondary | ICD-10-CM

## 2022-07-20 MED ORDER — TRAMADOL HCL 50 MG PO TABS: ORAL_TABLET | ORAL | 0 refills | Status: DC

## 2022-07-20 NOTE — Telephone Encounter (Signed)
Encourage patient to contact the pharmacy for refills or they can request refills through [facility]  (Please schedule appointment if patient has not been seen in over a year)    WHAT [place] THIS SENT TO:  [facility] [phone]  MEDICATION NAME & DOSE: tramadol 50 mg  NOTES/COMMENTS FROM PATIENT: pt calling b/c pharmacy states that has not rec'd yet; pls send/resend rx for pt      Front office please notify patient: It takes 48-72 hours to process rx refill requests Ask patient to call pharmacy to ensure rx is ready before heading there.

## 2022-07-20 NOTE — Telephone Encounter (Signed)
Patient is requesting a refill of the following medications: Requested Prescriptions   Pending Prescriptions Disp Refills   traMADol (ULTRAM) 50 MG tablet 180 tablet 0    Sig: TAKE 1 TABLET (50 MG TOTAL) BY MOUTH EVERY 6 (SIX) HOURS AS NEEDED FOR MODERATE PAIN    Date of patient request: 07/20/22 Last office visit: 05/10/2022 Date of last refill: 07/20/22-printed   Last refill amount: 180 Follow up time period per chart:6 months

## 2022-07-20 NOTE — Progress Notes (Signed)
See last entry. Tramadol reordered.

## 2022-07-20 NOTE — Telephone Encounter (Signed)
This has been sent to DR Carlota Raspberry to sign

## 2022-07-20 NOTE — Telephone Encounter (Signed)
Encourage patient to contact the pharmacy for refills or they can request refills through [facility]  (Please schedule appointment if patient has not been seen in over a year)    WHAT PHARMACY WOULD THEY LIKE THIS SENT TO: [facility] [address] [phone]  MEDICATION NAME & DOSE: tramadol 50 mg  NOTES/COMMENTS FROM PATIENT:      [place] office please notify patient: It takes 48-72 hours to process rx refill requests Ask patient to call pharmacy to ensure rx is ready before heading there.

## 2022-07-20 NOTE — Telephone Encounter (Signed)
Tramadol  LOV: 05/10/22 Last Refill:07/18/22 Upcoming appt: 10/18/22 Looks as if the Rx was sent in but I can not refuse the Rx or close it can you advise

## 2022-09-04 ENCOUNTER — Ambulatory Visit (INDEPENDENT_AMBULATORY_CARE_PROVIDER_SITE_OTHER): Payer: Managed Care, Other (non HMO) | Admitting: Family Medicine

## 2022-09-04 DIAGNOSIS — Z23 Encounter for immunization: Secondary | ICD-10-CM | POA: Diagnosis not present

## 2022-09-05 NOTE — Progress Notes (Signed)
Pt presented for flu shot and had no reaction

## 2022-09-21 ENCOUNTER — Encounter (HOSPITAL_COMMUNITY): Payer: Self-pay

## 2022-09-24 ENCOUNTER — Other Ambulatory Visit (HOSPITAL_COMMUNITY): Payer: Self-pay | Admitting: *Deleted

## 2022-09-24 MED ORDER — FUROSEMIDE 40 MG PO TABS: 40.0000 mg | ORAL_TABLET | Freq: Two times a day (BID) | ORAL | 3 refills | Status: DC

## 2022-10-01 NOTE — Progress Notes (Signed)
Date:  10/01/2022   ID:  Robin Owen, DOB October 12, 1961, MRN [id number]   Provider location: [facility] Type of Visit: Established patient  PCP:  Shade Flood, MD  Cardiologist:  Dr. Shirlee Latch  Chief Complaint: Shortness of breath   History of Present Illness: Robin Owen is a 61 y.o. female who has a past medical history of HTN, HLD, DMII, hypothyroidism and fibromyalgia (on disability), and CHF.    Admitted 11/16 through 09/17/15 with increased dyspnea. CXR with pulmonary edema. This prompted and ECHO that showed reduced EF 20%. RHC/LHC showed normal coronaries and reduced cardiac index. Cardiac MRI showed some mid-wall late gadolinium enhancement in the septum.  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 She has been lisinopril for some time. Started on coreg and lasix.  Discharge weight was 163 pounds.    She developed AKI and hyperkalemia in the setting of high dose Ibuprofen.  I asked her to stop this and started her on tramadol as needed instead.     Echo in 11/18 showed increase in EF to 50-55%.       Echo 10/24/18 LVEF 45-50%, Grade 1 DD, Mild MR, PA peak pressure 24 mm Hg. Echo in 8/21 showed EF 50-55%, basal inferior hypokinesis, normal RV.  Echo in 11/22 showed EF 50-55%, normal RV, normal IVC.    She returns today for followup of CHF.  She is using CPAP for OSA.  She is stable symptomatically.  No dyspnea walking on flat ground.  She is short of breath with stairs and inclines.  No chest pain.  No lightheadedness.  She has ongoing joint pain attributed to fibromyalgia.    ECG (personally reviewed): NSR, nonspecific T wave changes.   Test/Procedures  09/15/2015 ECHO EF 20% MV mild regurgitation TV mild regurgitation   09/19/2015 CMRI 1. Severely dilated left ventricle with normal wall thickness and severe systolic dysfunction (LVEF 20-25%) with global hypokinesis and akinesis of the entire septal wall and diffuse mid wall late gadolinium enhancement.  2.  Moderate RV dilatation with mild  moderate systolic dysfunction.  3. Moderate left and right atrial dilatation.  4. Mild to moderate mitral and mild tricuspid regurgitation.  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   RHC/LHC 09/16/2015  RA 7 PCWP 29 CO 3.11 CI 1.7  Normal Cors   Echo (5/17) with EF 35-40%.    Echo (12/17) with EF 35-40%, mild LV dilation.    CPX (12/17) with RER 1.13, peak VO2 20.8, VE/VCO2 slope 30.   Echo (11/18): EF 50-55%.   Echo (12/19): LVEF 45-50%, Grade 1 DD, Mild MR, PA peak pressure 24 mm Hg  Echo (8/21): EF 50-55%, basal inferior hypokinesis, normal RV size and systolic function.   Echo (11/22): EF 50-55%, normal RV, normal IVC   Labs 09/15/2015: BNP 2354 Labs 09/16/2015: TSH 1.99 Labs 09/17/2015: K 3.8 Creatinine 0.91 Labs 09/29/2015: K 4.5 Creatinine 1.09 Labs 10/05/2015: K 5.3 Creatinine 1.21, LDL 74, HCT 37.2 Labs 1/17: SPEP negative Labs 2/17: K 3.9, creatinine 1.34, digoxin 0.7, hemoglobin 12.5 Labs 3/17: K 3.9, creatinine 1.25, hemoglobin 10 Labs 4/17: K 4.2, creatinine 1.15 Labs 5/17: K 3.8, creatinine 1.11, HCT 31.7 Labs 6/17: K 5.1, creatinine 0.98, BNP 45 Labs 12/17: K 5, creatinine 1.17, BNP 35 Labs 3/18: K 3.9, creatinine 1.07 Labs 8/18: K 4.3, creatinine 0.94 Labs 2/19: K 5.7, creatinine 1.39, Na 125, hgb 12.6, LDL 102 Labs 5/19: K 4.7, creatinine 1.12 Labs  12/19: K 4.1, creatinine 1.09 Labs 5/20: creatinine 1.13 Labs 7/20: LDL 83, HDL 53, LFTs normal Labs 9/20: K 4.6, creatinine 1.2 Labs 2/21: K 4.6, creatinine 1.35 Labs 3/22: K 4.7, creatinine 1.18, LDL 88, HDL 45 Labs 9/22: LDL 75, K 5, creatinine 1.28 Labs 1/23: LDL 74, K 5.3, creatinine 1.4  Past Medical History:  Diagnosis Date   Anemia    CHF (congestive heart failure) (HCC)    Depression    Diabetes mellitus without complication (HCC)    Fibromyalgia    Hyperlipidemia    Hypertension     Hypothyroidism      Current Outpatient Medications  Medication Sig Dispense Refill   acetaminophen (TYLENOL) 325 MG tablet Take 650 mg by mouth every 6 (six) hours as needed for mild pain.     aspirin 81 MG EC tablet Adult Low Dose Aspirin 81 mg tablet,delayed release  Take 1 tablet every day by oral route.     atorvastatin (LIPITOR) 20 MG tablet Take 20 mg by mouth daily.     atorvastatin (LIPITOR) 40 MG tablet TAKE 1 TABLET BY MOUTH EVERY DAY 90 tablet 3   carvedilol (COREG) 25 MG tablet TAKE 1 TABLET BY MOUTH TWICE A DAY WITH MEALS 180 tablet 3   cephALEXin (KEFLEX) 500 MG capsule Take 1 capsule (500 mg total) by mouth 2 (two) times daily. 14 capsule 0   Cholecalciferol 25 MCG (1000 UT) tablet Take 1,000 Units by mouth daily.     Continuous Blood Gluc Sensor (FREESTYLE LIBRE 2 SENSOR) MISC APPLY SENSOR AS DIRECTED EVERY 14 DAYS     CORLANOR 5 MG TABS tablet TAKE 1 TABLET BY MOUTH TWICE A DAY WITH MEALS 60 tablet 11   cyclobenzaprine (FLEXERIL) 10 MG tablet TAKE 1 TABLET (10 MG TOTAL) BY MOUTH IN THE MORNING AND AT BEDTIME 60 tablet 1   famotidine (PEPCID) 40 MG tablet Take 40 mg by mouth at bedtime.     FLUoxetine (PROZAC) 20 MG tablet TAKE 1 TABLET BY MOUTH EVERY DAY 90 tablet 1   furosemide (LASIX) 40 MG tablet Take 1 tablet (40 mg total) by mouth 2 (two) times daily. 180 tablet 3   gabapentin (NEURONTIN) 600 MG tablet Take 1 tablet (600 mg total) by mouth 2 (two) times daily. 180 tablet 1   Insulin Degludec (TRESIBA [place]) Inject 20 Units into the skin daily.     Iron-FA-B Cmp-C-Biot-Probiotic (FUSION PLUS) CAPS Take 1 capsule by mouth daily. Take 1 capsule by mouth daily. 90 capsule 3   levothyroxine (SYNTHROID) 112 MCG tablet Take 112 mcg by mouth daily before breakfast.     metFORMIN (GLUCOPHAGE) 1000 MG tablet Take 1 tablet (1,000 mg total) by mouth 2 (two) times daily with a meal.     Multiple Vitamin (MULTIVITAMIN WITH MINERALS) TABS tablet Take 1 tablet by mouth daily.      NOVOTWIST 32G X 5 MM MISC Use as directed with Victozia.  2   ONETOUCH VERIO test strip Check blood glucose once daily  2   sacubitril-valsartan (ENTRESTO) 49-51 MG Take 1 tablet by mouth 2 (two) times daily. 180 tablet 3   spironolactone (ALDACTONE) 25 MG tablet TAKE 1 TABLET BY MOUTH EVERY DAY 90 tablet 3   traMADol (ULTRAM) 50 MG tablet TAKE 1 TABLET (50 MG TOTAL) BY MOUTH EVERY 6 (SIX) HOURS AS NEEDED FOR MODERATE PAIN 180 tablet 0   Urine Glucose-Ketones Test (KETO-DIASTIX) STRP 1 strip by Misc.(Non-Drug; Combo Route) route 2 (two) times daily as  needed. Use to check urine for ketones with high blood glucose     No current facility-administered medications for this visit.    Allergies:   Macrobid [nitrofurantoin]   Social History:  The patient  reports that she has never smoked. She has never used smokeless tobacco. She reports that she does not drink alcohol and does not use drugs.   Family History:  The patient's family history includes Atrial fibrillation in her maternal grandmother and mother; Diabetes Mellitus II in her brother and father; Hypertension in her brother, father, maternal grandmother, and mother; Kidney disease in her father; Pulmonary fibrosis in her mother; Stroke in her maternal grandmother.   ROS:  Please see the history of present illness.   All other systems are personally reviewed and negative.   Exam:   There were no vitals taken for this visit. General: NAD Neck: No JVD, no thyromegaly or thyroid nodule.  Lungs: Clear to auscultation bilaterally with normal respiratory effort. CV: Nondisplaced PMI.  Heart regular S1/S2, no S3/S4, no murmur.  No peripheral edema.  No carotid bruit.  Normal pedal pulses.  Abdomen: Soft, nontender, no hepatosplenomegaly, no distention.  Skin: Intact without lesions or rashes.  Neurologic: Alert and oriented x 3.  Psych: Normal affect. Extremities: No clubbing or cyanosis.  HEENT: Normal.   Recent Labs: 03/27/2022: BUN 23;  Creatinine, Ser 1.39; Potassium 4.5; Sodium 136  Personally reviewed   Wt Readings from Last 3 Encounters:  05/10/22 78.8 kg (173 lb 12.8 oz)  03/27/22 78.7 kg (173 lb 6.4 oz)  11/02/21 81.4 kg (179 lb 6.4 oz)      ASSESSMENT AND PLAN:  1. Chronic systolic CHF: 09/15/2015 ECHO EF 20%. Had cMRI EF 20-25%, RV mod dilated.  cMRI (11/16) showed an area of mid-wall septal late gadolinium enhancement. NICM perhaps related to HTN versus viral myocarditis. SPEP negative.  09/16/2015 RHC/LHC coronaries ok, low cardiac index 1.7.  Repeat echo in 5/17 and again in 12/17 showed some improvement, EF 35-40%.  CPX in 12/17 showed low normal peak VO2 with no clear cardiopulmonary limitation. Echo in 11/18 showed EF up to 50-55%, echo in 12/19 showed EF 45-50%, echo in 8/21 showed EF 50-55%.  Echo in 11/22 was stable with EF 50-55%.  NYHA class II symptoms chronically. She is not volume overloaded on exam but does not feel that she can decrease her Lasix dose (develops edema when this is attempted).    - Continue lasix 40 mg BID.  BMET today.  - Continue spironolactone 25 mg daily.  BMET today.  - Continue Coreg 25 mg BID - Continue Entresto 49/51 mg BID - Continue ivabradine 5 mg BID - EF out of range for ICD.   - She will followup in 6 months with APP.  2. Fibromyalgia: Disabled since 1996. No change.  3. Diabetes: Off empagliflozin due to recurrent yeast infection.  4. OSA: Now using CPAP.  5. Hyperlipidemia: Continue atorvastatin.  Signed, Jacklynn Ganong, FNP  10/01/2022  [facility] [address] [facility] [place] [address] [phone] (office) [phone] (fax)

## 2022-10-02 ENCOUNTER — Ambulatory Visit (HOSPITAL_COMMUNITY)
Admission: RE | Admit: 2022-10-02 | Discharge: 2022-10-02 | Disposition: A | Discharge status: Home / Self Care | Payer: Managed Care, Other (non HMO) | Source: Ambulatory Visit | Attending: Family Medicine | Admitting: Family Medicine

## 2022-10-02 ENCOUNTER — Encounter (HOSPITAL_COMMUNITY): Payer: Self-pay

## 2022-10-02 VITALS — BP 108/64 | HR 68 | Wt 170.0 lb

## 2022-10-02 DIAGNOSIS — Z833 Family history of diabetes mellitus: Secondary | ICD-10-CM | POA: Diagnosis not present

## 2022-10-02 DIAGNOSIS — Z794 Long term (current) use of insulin: Secondary | ICD-10-CM

## 2022-10-02 DIAGNOSIS — Z823 Family history of stroke: Secondary | ICD-10-CM | POA: Diagnosis not present

## 2022-10-02 DIAGNOSIS — Z8249 Family history of ischemic heart disease and other diseases of the circulatory system: Secondary | ICD-10-CM | POA: Diagnosis not present

## 2022-10-02 DIAGNOSIS — Z6827 Body mass index (BMI) 27.0-27.9, adult: Secondary | ICD-10-CM | POA: Insufficient documentation

## 2022-10-02 DIAGNOSIS — I428 Other cardiomyopathies: Secondary | ICD-10-CM | POA: Diagnosis not present

## 2022-10-02 DIAGNOSIS — E785 Hyperlipidemia, unspecified: Secondary | ICD-10-CM | POA: Diagnosis not present

## 2022-10-02 DIAGNOSIS — G4733 Obstructive sleep apnea (adult) (pediatric): Secondary | ICD-10-CM | POA: Diagnosis not present

## 2022-10-02 DIAGNOSIS — E039 Hypothyroidism, unspecified: Secondary | ICD-10-CM | POA: Diagnosis not present

## 2022-10-02 DIAGNOSIS — E119 Type 2 diabetes mellitus without complications: Secondary | ICD-10-CM | POA: Insufficient documentation

## 2022-10-02 DIAGNOSIS — E669 Obesity, unspecified: Secondary | ICD-10-CM | POA: Diagnosis not present

## 2022-10-02 DIAGNOSIS — I5022 Chronic systolic (congestive) heart failure: Secondary | ICD-10-CM | POA: Insufficient documentation

## 2022-10-02 DIAGNOSIS — E118 Type 2 diabetes mellitus with unspecified complications: Secondary | ICD-10-CM | POA: Diagnosis not present

## 2022-10-02 DIAGNOSIS — Z79899 Other long term (current) drug therapy: Secondary | ICD-10-CM | POA: Insufficient documentation

## 2022-10-02 DIAGNOSIS — I110 Hypertensive heart disease with heart failure: Secondary | ICD-10-CM | POA: Diagnosis not present

## 2022-10-02 DIAGNOSIS — M797 Fibromyalgia: Secondary | ICD-10-CM

## 2022-10-02 LAB — BRAIN NATRIURETIC PEPTIDE: B Natriuretic Peptide: 45.8 pg/mL (ref 0.0–100.0)

## 2022-10-02 LAB — BASIC METABOLIC PANEL
Anion gap: 9 (ref 5–15)
BUN: 18 mg/dL (ref 6–20)
CO2: 27 mmol/L (ref 22–32)
Calcium: 9.1 mg/dL (ref 8.9–10.3)
Chloride: 102 mmol/L (ref 98–111)
Creatinine, Ser: 1.26 mg/dL — ABNORMAL HIGH (ref 0.44–1.00)
GFR, Estimated: 49 mL/min — ABNORMAL LOW (ref >60)
Glucose, Bld: 129 mg/dL — ABNORMAL HIGH (ref 70–99)
Potassium: 4.2 mmol/L (ref 3.5–5.1)
Sodium: 138 mmol/L (ref 135–145)

## 2022-10-02 NOTE — Patient Instructions (Addendum)
It was great to see you today! No medication changes are needed at this time.  Labs today We will only contact you if something comes back abnormal or we need to make some changes. Otherwise no news is good news!  Your physician wants you to follow-up in: 6 months with Dr McLean. You will receive a reminder letter in the mail two months in advance. If you don't receive a letter, please call our office to schedule the follow-up appointment.   Do the following things EVERYDAY: Weigh yourself in the morning before breakfast. Write it down and keep it in a log. Take your medicines as prescribed Eat low salt foods--Limit salt (sodium) to 2000 mg per day.  Stay as active as you can everyday Limit all fluids for the day to less than 2 liters   At the [facility], you and your health needs are our priority. As part of our continuing mission to provide you with exceptional heart care, we have created designated Provider Care Teams. These Care Teams include your primary Cardiologist (physician) and Advanced Practice Providers (APPs- Physician Assistants and Nurse Practitioners) who all work together to provide you with the care you need, when you need it.   You may see any of the following providers on your designated Care Team at your next follow up: Dr Daniel Bensimhon Dr Dalton McLean Dr. Aditya Sabharwal Amy Clegg, NP Brittainy Simmons, PA Jessica Milford,NP Lindsay Finch, PA Alma Diaz, NP Lauren Kemp, PharmD   Please be sure to bring in all your medications bottles to every appointment.   If you have any questions or concerns before your next appointment please send us a message through mychart or call our office at [phone].    TO LEAVE A MESSAGE FOR THE NURSE SELECT OPTION 2, PLEASE LEAVE A MESSAGE INCLUDING: YOUR NAME DATE OF BIRTH CALL BACK NUMBER REASON FOR CALL**this is important as we prioritize the call backs  YOU WILL RECEIVE A CALL BACK THE SAME  DAY AS LONG AS YOU CALL BEFORE 4:00 PM   

## 2022-10-15 ENCOUNTER — Other Ambulatory Visit: Payer: Self-pay | Admitting: Family Medicine

## 2022-10-15 DIAGNOSIS — M797 Fibromyalgia: Secondary | ICD-10-CM

## 2022-10-16 ENCOUNTER — Other Ambulatory Visit: Payer: Self-pay | Admitting: Family Medicine

## 2022-10-16 ENCOUNTER — Encounter: Payer: Self-pay | Admitting: Family Medicine

## 2022-10-16 DIAGNOSIS — M797 Fibromyalgia: Secondary | ICD-10-CM

## 2022-10-16 MED ORDER — TRAMADOL HCL 50 MG PO TABS: ORAL_TABLET | ORAL | 0 refills | Status: DC

## 2022-10-16 NOTE — Telephone Encounter (Signed)
Tramadol 50 mg LOV: 09/04/22 Last Refill:07/20/22 Upcoming appt: none

## 2022-10-16 NOTE — Telephone Encounter (Signed)
Office visit July 13, meds discussed at that time with tramadol twice per day.  Controlled substance database reviewed, tramadol last filled for 180 on 07/20/2022.  No concerns.  Refill ordered.

## 2022-10-17 LAB — HM MAMMOGRAPHY

## 2022-10-18 ENCOUNTER — Encounter: Payer: Self-pay | Admitting: Family Medicine

## 2022-10-18 ENCOUNTER — Other Ambulatory Visit: Payer: Self-pay | Admitting: Family Medicine

## 2022-10-18 ENCOUNTER — Ambulatory Visit (INDEPENDENT_AMBULATORY_CARE_PROVIDER_SITE_OTHER): Payer: Managed Care, Other (non HMO) | Admitting: Family Medicine

## 2022-10-18 VITALS — BP 120/68 | HR 68 | Temp 98.5°F | Ht 66.0 in | Wt 171.4 lb

## 2022-10-18 DIAGNOSIS — M797 Fibromyalgia: Secondary | ICD-10-CM

## 2022-10-18 DIAGNOSIS — I10 Essential (primary) hypertension: Secondary | ICD-10-CM

## 2022-10-18 DIAGNOSIS — Z23 Encounter for immunization: Secondary | ICD-10-CM

## 2022-10-18 DIAGNOSIS — E039 Hypothyroidism, unspecified: Secondary | ICD-10-CM | POA: Diagnosis not present

## 2022-10-18 DIAGNOSIS — Z794 Long term (current) use of insulin: Secondary | ICD-10-CM

## 2022-10-18 DIAGNOSIS — F339 Major depressive disorder, recurrent, unspecified: Secondary | ICD-10-CM

## 2022-10-18 DIAGNOSIS — E1165 Type 2 diabetes mellitus with hyperglycemia: Secondary | ICD-10-CM

## 2022-10-18 DIAGNOSIS — I5022 Chronic systolic (congestive) heart failure: Secondary | ICD-10-CM

## 2022-10-18 MED ORDER — CYCLOBENZAPRINE HCL 10 MG PO TABS: 10.0000 mg | ORAL_TABLET | Freq: Two times a day (BID) | ORAL | 1 refills | Status: DC

## 2022-10-18 MED ORDER — FLUOXETINE HCL 20 MG PO TABS: 20.0000 mg | ORAL_TABLET | Freq: Every day | ORAL | 1 refills | Status: DC

## 2022-10-18 MED ORDER — GABAPENTIN 600 MG PO TABS: 600.0000 mg | ORAL_TABLET | Freq: Two times a day (BID) | ORAL | 1 refills | Status: DC

## 2022-10-18 MED ORDER — FUSION PLUS PO CAPS: 1.0000 | ORAL_CAPSULE | Freq: Every day | ORAL | 3 refills | Status: DC

## 2022-10-18 NOTE — Patient Instructions (Addendum)
Hep C test can be checked with future bloodwork. Keep follow up with specialists and colonoscopy next year. No med changes today, but if hip pain does not improve with some rest, please follow up to discuss further.   Take care and hope you get some rest soon!

## 2022-10-18 NOTE — Progress Notes (Signed)
Subjective:  Patient ID: Robin Owen, female    DOB: 02-03-1961  Age: 61 y.o. MRN: [id number]  CC:  Chief Complaint  Patient presents with   Hypertension    Pt states all is well    HPI [name] presents for   Hypertension: With chronic systolic heart failure, pulmonary hypertension, followed by Dr. Aundra Dubin, appointment in May.  On CPAP for OSA.  Stable symptomatically at May visit.  Continued on Lasix 40 mg twice daily, spironolactone 25 mg daily, Coreg 25 mg twice daily, Entresto 49/51 mg twice daily, ivabradine 5 mg twice daily.  Out of range for ICD, echo EF 50 to 55% November 2022.   No dyspnea or CP. No new side effects with meds.  BP Readings from Last 3 Encounters:  10/18/22 120/68  10/02/22 108/64  05/10/22 110/76   Lab Results  Component Value Date   CREATININE 1.26 (H) 10/02/2022  Creat 1.18 on 05/29/22- [facility].   Fibromyalgia With depression.  Treated with fluoxetine 20 mg daily for depression symptoms, gabapentin 600 mg twice daily, Flexeril 10 mg twice daily and tramadol for breakthrough pain, usually twice daily.  Last discussed in July.  Stable at that time. She has been more active recently. Trying to get things together for the holidays. Chronic hip pain has flared some, no additional meds needed. Overall stable. No falls, no weakness. Some downtime planned after Christmas. Will follow up if not improving.  Needs rf of MVI. Fusion plus.     10/18/2022   10:07 AM 05/10/2022   10:39 AM 11/02/2021   10:23 AM 07/27/2021    3:15 PM 04/04/2016    2:21 PM  Depression screen PHQ 2/9  Decreased Interest 1 1 0 0 0  Down, Depressed, Hopeless 1 1 0 0 0  PHQ - 2 Score 2 2 0 0 0  Altered sleeping 0 2 0    Tired, decreased energy 2 2 3     Change in appetite 0 1 0    Feeling bad or failure about yourself  0 1 0    Trouble concentrating 0 2 1    Moving slowly or fidgety/restless 0 0 0    Suicidal thoughts 0 0 0    PHQ-9 Score 4 10 4        Diabetes with chronic  kidney disease, hyperglycemia, treated by endocrinology, visit noted from August 29.  Dr. Grandville Silos. Semaglutide was increased to 1 mg weekly, no n/v with this dose, Tyler Aas was decreased to 20 units at bedtime and discontinued glimepiride.  Continued on metformin 1000 mg twice daily. Now back up to 22u tresiba.   Hypothyroidism, also discussed with endocrinology in August, TSH was up from 1.53-3.14.  Continued on Synthroid 112 mcg daily with plan to follow-up in April with labs at that time.    HM: [facility], [facility].  Covid vaccine - declines most recent booster.  Colon CA screening - Dr. Earlean Shawl at age 73, repeat in 67 yrs.- next yr.    History Patient Active Problem List   Diagnosis Date Noted   Pyelonephritis 03/21/2016   Sepsis ([place]) 03/21/2016   Hyponatremia 03/21/2016   Type 2 diabetes mellitus without complication, without long-term current use of insulin ([place]) 03/21/2016   Normocytic anemia 99991111   Chronic systolic heart failure ([place]) 10/02/2015   Nonischemic cardiomyopathy ([place])    Pulmonary hypertension ([place])    Hyperlipidemia 09/14/2015   Essential hypertension 09/14/2015  Fibromyalgia 09/14/2015   Hypothyroidism 09/14/2015   Past Medical History:  Diagnosis Date   Anemia    CHF (congestive heart failure) (HCC)    Depression    Diabetes mellitus without complication (HCC)    Fibromyalgia    Hyperlipidemia    Hypertension    Hypothyroidism    Past Surgical History:  Procedure Laterality Date   CARDIAC CATHETERIZATION N/A 09/16/2015   Procedure: Right/Left Heart Cath and Coronary Angiography;  Surgeon: Peter M [place], MD;  Location: [facility];  Service: Cardiovascular;  Laterality: N/A;   ENDOMETRIAL ABLATION  2012   Allergies  Allergen Reactions   Nitrofurantoin Other (See Comments)    Fever, flu like symtoms   Prior to Admission medications   Medication Sig Start Date End Date Taking? Authorizing  Provider  acetaminophen (TYLENOL) 325 MG tablet Take 650 mg by mouth every 6 (six) hours as needed for mild pain.   Yes [provider]  aspirin 81 MG EC tablet Adult Low Dose Aspirin 81 mg tablet,delayed release  Take 1 tablet every day by oral route.   Yes [provider]  atorvastatin (LIPITOR) 20 MG tablet Take 20 mg by mouth daily. 01/08/20  Yes [provider]  carvedilol (COREG) 25 MG tablet TAKE 1 TABLET BY MOUTH TWICE A DAY WITH MEALS 06/21/22  Yes Laurey Morale, MD  Cholecalciferol 25 MCG (1000 UT) tablet Take 1,000 Units by mouth daily.   Yes [provider]  Continuous Blood Gluc Sensor (FREESTYLE LIBRE 2 SENSOR) MISC APPLY SENSOR AS DIRECTED EVERY 14 DAYS 09/07/21  Yes [provider]  cyclobenzaprine (FLEXERIL) 10 MG tablet TAKE 1 TABLET (10 MG TOTAL) BY MOUTH IN THE MORNING AND AT BEDTIME 07/18/22  Yes Shade Flood, MD  famotidine (PEPCID) 40 MG tablet Take 40 mg by mouth at bedtime. 05/10/20  Yes [provider]  famotidine (PEPCID) 40 MG tablet Take by mouth. 10/05/22  Yes [provider]  FLUoxetine (PROZAC) 20 MG capsule  10/06/20  Yes [provider]  FLUoxetine (PROZAC) 20 MG tablet TAKE 1 TABLET BY MOUTH EVERY DAY 04/20/22  Yes Shade Flood, MD  furosemide (LASIX) 40 MG tablet Take 1 tablet (40 mg total) by mouth 2 (two) times daily. 09/24/22  Yes Laurey Morale, MD  gabapentin (NEURONTIN) 600 MG tablet Take 1 tablet (600 mg total) by mouth 2 (two) times daily. 05/10/22  Yes Shade Flood, MD  Insulin Degludec (TRESIBA [place]) Inject 20 Units into the skin daily.   Yes [provider]  Iron-FA-B Cmp-C-Biot-Probiotic (FUSION PLUS) CAPS Take 1 capsule by mouth daily. Take 1 capsule by mouth daily. 05/10/22  Yes Shade Flood, MD  levothyroxine (SYNTHROID) 112 MCG tablet Take 112 mcg by mouth daily before breakfast.   Yes [provider]  metFORMIN (GLUCOPHAGE) 1000 MG tablet Take  1 tablet (1,000 mg total) by mouth 2 (two) times daily with a meal. 09/17/15  Yes Calvert Cantor, MD  Multiple Vitamin (MULTIVITAMIN WITH MINERALS) TABS tablet Take 1 tablet by mouth daily.   Yes [provider]  NOVOTWIST 32G X 5 MM MISC Use as directed with Victozia. 08/19/15  Yes [provider]  Letta Pate VERIO test strip Check blood glucose once daily 08/19/15  Yes [provider]  OZEMPIC, 1 MG/DOSE, 4 MG/3ML SOPN Inject 1 mg into the skin once a week. 05/14/22  Yes [provider]  sacubitril-valsartan (ENTRESTO) 49-51 MG Take 1 tablet by mouth 2 (two)  times daily. 05/22/22  Yes Larey Dresser, MD  Semaglutide (OZEMPIC, 1 MG/DOSE, [place]) Inject 1 mg into the skin once a week.   Yes [provider]  spironolactone (ALDACTONE) 25 MG tablet TAKE 1 TABLET BY MOUTH EVERY DAY 07/05/22  Yes Larey Dresser, MD  traMADol (ULTRAM) 50 MG tablet TAKE 1 TABLET (50 MG TOTAL) BY MOUTH EVERY 6 (SIX) HOURS AS NEEDED FOR MODERATE PAIN 10/16/22  Yes Wendie Agreste, MD  TRESIBA FLEXTOUCH 100 UNIT/ML FlexTouch Pen Inject into the skin. 05/30/22  Yes [provider]  Urine Glucose-Ketones Test (KETO-DIASTIX) STRP 1 strip by Misc.(Non-Drug; Combo Route) route 2 (two) times daily as needed. Use to check urine for ketones with high blood glucose 09/01/17  Yes [provider]  atorvastatin (LIPITOR) 20 MG tablet Take 1 tablet by mouth daily.    [provider]  carvedilol (COREG) 25 MG tablet Take 1 tablet by mouth 2 (two) times daily with a meal.    [provider]  cephALEXin (KEFLEX) 500 MG capsule Take 1 capsule (500 mg total) by mouth 2 (two) times daily. Patient not taking: Reported on 10/18/2022 07/18/22   Montine Circle, PA-C  cephALEXin (KEFLEX) 500 MG capsule cephalexin 500 mg capsule  Take 1 capsule 3 times a day by oral route for 3 days.    [provider]  CORLANOR 5 MG TABS tablet TAKE 1 TABLET BY MOUTH TWICE A DAY WITH  MEALS 09/12/21   Larey Dresser, MD  cyclobenzaprine (FLEXERIL) 5 MG tablet cyclobenzaprine 5 mg tablet  TAKE 1 TABLET (5 MG TOTAL) BY MOUTH IN THE MORNING AND AT BEDTIME    [provider]  furosemide (LASIX) 40 MG tablet Take 1 tablet by mouth 2 (two) times daily.    [provider]  ivabradine (CORLANOR) 5 MG TABS tablet Take 1 tablet by mouth 2 (two) times daily with a meal.    [provider]  OZEMPIC, 1 MG/DOSE, 4 MG/3ML SOPN Inject 1 mg into the skin once a week.    [provider]  Semaglutide,0.25 or 0.5MG /DOS, (OZEMPIC, 0.25 OR 0.5 MG/DOSE,) 2 MG/1.5ML SOPN Ozempic 0.25 mg or 0.5 mg (2 mg/1.5 mL) subcutaneous pen injector    [provider]  spironolactone (ALDACTONE) 25 MG tablet Take 1 tablet by mouth daily.    [provider]  TRESIBA FLEXTOUCH 100 UNIT/ML FlexTouch Pen Inject into the skin.    [provider]   Social History   Socioeconomic History   Marital status: Married    Spouse name: Not on file   Number of children: Not on file   Years of education: Not on file   Highest education level: Not on file  Occupational History   Not on file  Tobacco Use   Smoking status: Never   Smokeless tobacco: Never  Substance and Sexual Activity   Alcohol use: No   Drug use: No   Sexual activity: Yes    Birth control/protection: None  Other Topics Concern   Not on file  Social History Narrative   Not on file   Social Determinants of Health   Financial Resource Strain: Not on file  Food Insecurity: Not on file  Transportation Needs: Not on file  Physical Activity: Not on file  Stress: Not on file  Social Connections: Not on file  Intimate Partner Violence: Not on file    Review of Systems  Per HPI.  Objective:   Vitals:   10/18/22 1015  BP: 120/68  Pulse: 68  Temp: 98.5 F (36.9 C)  SpO2: 99%  Weight: 171 lb 6.4 oz (77.7 kg)  Height: 5\' 6"  (1.676 m)     Physical Exam Vitals reviewed.   Constitutional:      Appearance: Normal appearance. She is well-developed.  HENT:     Head: Normocephalic and atraumatic.  Eyes:     Conjunctiva/sclera: Conjunctivae normal.     Pupils: Pupils are equal, round, and reactive to light.  Neck:     Vascular: No carotid bruit.  Cardiovascular:     Rate and Rhythm: Normal rate and regular rhythm.     Heart sounds: Normal heart sounds.  Pulmonary:     Effort: Pulmonary effort is normal.     Breath sounds: Normal breath sounds.  Abdominal:     Palpations: Abdomen is soft. There is no pulsatile mass.     Tenderness: There is no abdominal tenderness.  Musculoskeletal:     Right lower leg: No edema.     Left lower leg: No edema.  Skin:    General: Skin is warm and dry.  Neurological:     Mental Status: She is alert and oriented to person, place, and time.  Psychiatric:        Mood and Affect: Mood normal.        Behavior: Behavior normal.        Assessment & Plan:  INICE MARTINOVIC is a 61 y.o. female . Fibromyalgia - Plan: cyclobenzaprine (FLEXERIL) 10 MG tablet, FLUoxetine (PROZAC) 20 MG tablet, gabapentin (NEURONTIN) 600 MG tablet  -Overall stable, some increased fatigue, hip pain with recent increased activity.  Some decreased activity/rest planned soon..  Continue same regimen with RTC precautions if persistent or worsening.  Need for Tdap vaccination - Plan: CANCELED: Tdap vaccine greater than or equal to 7yo IM  Hypothyroidism, unspecified type  -Followed by endocrinology with follow-up labs planned in April.  No med changes  Essential hypertension Chronic systolic heart failure (HCC)  -Stable, continue same regimen.  Appears euvolemic.  Follow-up as planned with cardiology.  Type 2 diabetes mellitus with hyperglycemia, with long-term current use of insulin (HCC)  -Tolerating current med regimen, follow-up with endocrinology as planned  Depression, recurrent ([place]) Overall stable with current med regimen, continue  same.  Meds ordered this encounter  Medications   cyclobenzaprine (FLEXERIL) 10 MG tablet    Sig: Take 1 tablet (10 mg total) by mouth in the morning and at bedtime.    Dispense:  60 tablet    Refill:  1   FLUoxetine (PROZAC) 20 MG tablet    Sig: Take 1 tablet (20 mg total) by mouth daily.    Dispense:  90 tablet    Refill:  1    DX Code Needed  .   gabapentin (NEURONTIN) 600 MG tablet    Sig: Take 1 tablet (600 mg total) by mouth 2 (two) times daily.    Dispense:  180 tablet    Refill:  1   Iron-FA-B Cmp-C-Biot-Probiotic (FUSION PLUS) CAPS    Sig: Take 1 capsule by mouth daily. Take 1 capsule by mouth daily.    Dispense:  90 capsule    Refill:  3    Should be 90 day supply.   Patient Instructions  Hep C test can be checked with future bloodwork. Keep follow up with specialists and colonoscopy next year. No med changes today, but if hip pain does not improve with some rest, please follow up  to discuss further.   Take care and hope you get some rest soon!      Signed,   Merri Ray, MD [place] [facility] 10/18/22 11:13 AM

## 2022-10-19 ENCOUNTER — Other Ambulatory Visit (HOSPITAL_COMMUNITY): Payer: Self-pay | Admitting: Cardiology

## 2022-10-19 DIAGNOSIS — I5022 Chronic systolic (congestive) heart failure: Secondary | ICD-10-CM

## 2022-10-19 MED ORDER — FUROSEMIDE 40 MG PO TABS: 40.0000 mg | ORAL_TABLET | Freq: Two times a day (BID) | ORAL | 11 refills | Status: DC

## 2022-10-19 NOTE — Addendum Note (Signed)
Addended by: Pepper Kerrick, Milagros Reap on: 10/19/2022 11:25 AM   Modules accepted: Orders

## 2022-10-21 ENCOUNTER — Encounter: Payer: Self-pay | Admitting: Family Medicine

## 2022-10-26 ENCOUNTER — Encounter: Payer: Self-pay | Admitting: Family Medicine

## 2022-10-31 ENCOUNTER — Encounter: Payer: Self-pay | Admitting: Family Medicine

## 2022-10-31 ENCOUNTER — Telehealth (INDEPENDENT_AMBULATORY_CARE_PROVIDER_SITE_OTHER): Payer: Medicare HMO | Admitting: Family Medicine

## 2022-10-31 VITALS — Temp 100.8°F

## 2022-10-31 DIAGNOSIS — U071 COVID-19: Secondary | ICD-10-CM

## 2022-10-31 MED ORDER — BENZONATATE 100 MG PO CAPS: 100.0000 mg | ORAL_CAPSULE | Freq: Three times a day (TID) | ORAL | 0 refills | Status: DC | PRN

## 2022-10-31 MED ORDER — MOLNUPIRAVIR EUA 200MG CAPSULE: 4.0000 | ORAL_CAPSULE | Freq: Two times a day (BID) | ORAL | 0 refills | Status: AC

## 2022-10-31 NOTE — Patient Instructions (Signed)
Sorry to hear that you are sick.  Try Mucinex or Mucinex DM for cough, Tessalon Perles were also sent to the pharmacy if needed for cough.  Make sure to drink plenty of fluids and rest.  Antiviral molnupiravir also sent to your pharmacy.  Typically that is well-tolerated but if you do have significant side effects that can be stopped.  Okay to take vitamin C, vitamin D, zinc temporarily if needed.  If you have chest pain, shortness of breath at rest, confusion or other acute worsening be seen.  See information below on masking and isolation precautions.  Hope you feel better soon.  Your employer may have specific requirements for return to work, but this information is provided from the CDC. There is a link provided below for more information if needed.   Everyone who has presumed or confirmed COVID-19 should stay home and isolate from other people for at least 5 full days (day 0 is the first day of symptoms or the date of the day of the positive viral test for asymptomatic persons). You can end isolation after 5 full days if you are fever-free for 24 hours without the use of fever-reducing medication and your other symptoms have improved (Loss of taste and smell may persist for weeks or months after recovery and need not delay the end of isolation). You should continue to wear a well-fitting mask around others at home and in public for 5 additional days (day 6 through day 10) after the end of your 5-day isolation period. If you are unable to wear a mask when around others, you should continue to isolate for a full 10 days. Avoid people who have weakened immune systems or are more likely to get very sick from COVID-19, and nursing homes and other high-risk settings, until after at least 10 days.  If you continue to have fever or your other symptoms have not improved after 5 days of isolation, you should wait to end your isolation until you are fever-free for 24 hours without the use of fever-reducing  medication and your other symptoms have improved. Continue to wear a well-fitting mask through day 10.   https://brown.org/.html

## 2022-10-31 NOTE — Progress Notes (Signed)
Virtual Visit via Video Note  I connected with Robin Owen on 10/31/22 at 5:38 PM by a video enabled telemedicine application and verified that I am speaking with the correct person using two identifiers.  Patient location: home, with husband - consent to discuss PHI.  My location: office - [place].    I discussed the limitations, risks, security and privacy concerns of performing an evaluation and management service by telephone and the availability of in person appointments. I also discussed with the patient that there may be a patient responsible charge related to this service. The patient expressed understanding and agreed to proceed, consent obtained  Chief complaint: Chief Complaint  Patient presents with   Covid Positive    Pt reports has had a positive Covid test this morning, symptoms started last night with headache, pt notes some chills fever 100.8 about an hour ago, slight cough, and some congestion     History of Present Illness: Robin Owen is a 62 y.o. female  COVID-19 infection Initial symptoms last night with headache, chills, fever 100.8 today, some cough.  Positive home covid test this am.  Multiple sick contacts with Covid recently.  No chest pain or dyspnea. No confusion, drinking fluids.  Tx: tylenol - helping some with body aches.    COVID vaccines in 2021.  Did not have most recent booster. eGFR 49 on 10/02/2022 Last lipitor yesterday morning.  Interaction with Corlanor and Paxlovid, molnupiravir discussed including potential side effects, risks, rebound COVID and treatment if that were to occur.  She would like to try antiviral.  Immunization History  Administered Date(s) Administered   Influenza,inj,Quad PF,6+ Mos 07/27/2021, 09/04/2022   Moderna Sars-Covid-2 Vaccination 01/16/2020, 02/19/2020, 09/17/2020   Zoster, Unspecified 09/19/2022    Patient Active Problem List   Diagnosis Date Noted   Pyelonephritis 03/21/2016   Sepsis ([place])  03/21/2016   Hyponatremia 03/21/2016   Type 2 diabetes mellitus without complication, without long-term current use of insulin ([place]) 03/21/2016   Normocytic anemia 89/21/1941   Chronic systolic heart failure ([place]) 10/02/2015   Nonischemic cardiomyopathy ([place])    Pulmonary hypertension ([place])    Hyperlipidemia 09/14/2015   Essential hypertension 09/14/2015   Fibromyalgia 09/14/2015   Hypothyroidism 09/14/2015   Past Medical History:  Diagnosis Date   Anemia    CHF (congestive heart failure) ([place])    Depression    Diabetes mellitus without complication ([place])    Fibromyalgia    Hyperlipidemia    Hypertension    Hypothyroidism    Past Surgical History:  Procedure Laterality Date   CARDIAC CATHETERIZATION N/A 09/16/2015   Procedure: Right/Left Heart Cath and Coronary Angiography;  Surgeon: Peter M Martinique, MD;  Location: [facility];  Service: Cardiovascular;  Laterality: N/A;   ENDOMETRIAL ABLATION  2012   Allergies  Allergen Reactions   Nitrofurantoin Other (See Comments)    Fever, flu like symtoms   Prior to Admission medications   Medication Sig Start Date End Date Taking? Authorizing Provider  acetaminophen (TYLENOL) 325 MG tablet Take 650 mg by mouth every 6 (six) hours as needed for mild pain.    [provider]  aspirin 81 MG EC tablet Adult Low Dose Aspirin 81 mg tablet,delayed release  Take 1 tablet every day by oral route.    [provider]  atorvastatin (LIPITOR) 20 MG tablet Take 20 mg by mouth daily. 01/08/20   [provider]  atorvastatin (LIPITOR) 20 MG tablet Take 1 tablet by mouth daily.  [provider]  carvedilol (COREG) 25 MG tablet TAKE 1 TABLET BY MOUTH TWICE A DAY WITH MEALS 06/21/22   Larey Dresser, MD  carvedilol (COREG) 25 MG tablet Take 1 tablet by mouth 2 (two) times daily with a meal.    [provider]  cephALEXin (KEFLEX) 500 MG capsule Take 1 capsule (500 mg total) by mouth 2 (two) times  daily. Patient not taking: Reported on 10/18/2022 07/18/22   Montine Circle, PA-C  cephALEXin (KEFLEX) 500 MG capsule cephalexin 500 mg capsule  Take 1 capsule 3 times a day by oral route for 3 days.    [provider]  Cholecalciferol 25 MCG (1000 UT) tablet Take 1,000 Units by mouth daily.    [provider]  Continuous Blood Gluc Sensor (FREESTYLE LIBRE 2 SENSOR) MISC APPLY SENSOR AS DIRECTED EVERY 14 DAYS 09/07/21   [provider]  CORLANOR 5 MG TABS tablet TAKE 1 TABLET BY MOUTH TWICE A DAY WITH MEALS 10/19/22   Larey Dresser, MD  cyclobenzaprine (FLEXERIL) 10 MG tablet Take 1 tablet (10 mg total) by mouth in the morning and at bedtime. 10/18/22   Wendie Agreste, MD  cyclobenzaprine (FLEXERIL) 5 MG tablet cyclobenzaprine 5 mg tablet  TAKE 1 TABLET (5 MG TOTAL) BY MOUTH IN THE MORNING AND AT BEDTIME    [provider]  famotidine (PEPCID) 40 MG tablet Take 40 mg by mouth at bedtime. 05/10/20   [provider]  famotidine (PEPCID) 40 MG tablet Take by mouth. 10/05/22   [provider]  FLUoxetine (PROZAC) 20 MG capsule  10/06/20   [provider]  FLUoxetine (PROZAC) 20 MG tablet Take 1 tablet (20 mg total) by mouth daily. 10/18/22   Wendie Agreste, MD  furosemide (LASIX) 40 MG tablet Take 1 tablet (40 mg total) by mouth 2 (two) times daily. 09/24/22   Larey Dresser, MD  furosemide (LASIX) 40 MG tablet Take 1 tablet (40 mg total) by mouth 2 (two) times daily. 10/19/22   Bensimhon, Shaune Pascal, MD  gabapentin (NEURONTIN) 600 MG tablet Take 1 tablet (600 mg total) by mouth 2 (two) times daily. 10/18/22   Wendie Agreste, MD  Insulin Degludec (TRESIBA [place]) Inject 20 Units into the skin daily.    [provider]  Iron-FA-B Cmp-C-Biot-Probiotic (FUSION PLUS) CAPS Take 1 capsule by mouth daily. Take 1 capsule by mouth daily. 10/18/22   Wendie Agreste, MD  ivabradine (CORLANOR) 5 MG TABS tablet Take 1 tablet by mouth 2  (two) times daily with a meal.    [provider]  levothyroxine (SYNTHROID) 112 MCG tablet Take 112 mcg by mouth daily before breakfast.    [provider]  metFORMIN (GLUCOPHAGE) 1000 MG tablet Take 1 tablet (1,000 mg total) by mouth 2 (two) times daily with a meal. 09/17/15   Debbe Odea, MD  Multiple Vitamin (MULTIVITAMIN WITH MINERALS) TABS tablet Take 1 tablet by mouth daily.    [provider]  NOVOTWIST 32G X 5 MM MISC Use as directed with Victozia. 08/19/15   [provider]  Roma Schanz test strip Check blood glucose once daily 08/19/15   [provider]  OZEMPIC, 1 MG/DOSE, 4 MG/3ML SOPN Inject 1 mg into the skin once a week.    [provider]  OZEMPIC, 1 MG/DOSE, 4 MG/3ML SOPN Inject 1 mg into the skin once a week. 05/14/22   [provider]  sacubitril-valsartan (ENTRESTO) 49-51 MG Take 1 tablet  by mouth 2 (two) times daily. 05/22/22   Larey Dresser, MD  Semaglutide (OZEMPIC, 1 MG/DOSE, ) Inject 1 mg into the skin once a week.    [provider]  Semaglutide,0.25 or 0.5MG/DOS, (OZEMPIC, 0.25 OR 0.5 MG/DOSE,) 2 MG/1.5ML SOPN Ozempic 0.25 mg or 0.5 mg (2 mg/1.5 mL) subcutaneous pen injector    [provider]  spironolactone (ALDACTONE) 25 MG tablet TAKE 1 TABLET BY MOUTH EVERY DAY 07/05/22   Larey Dresser, MD  spironolactone (ALDACTONE) 25 MG tablet Take 1 tablet by mouth daily.    [provider]  traMADol (ULTRAM) 50 MG tablet TAKE 1 TABLET (50 MG TOTAL) BY MOUTH EVERY 6 (SIX) HOURS AS NEEDED FOR MODERATE PAIN 10/16/22   Wendie Agreste, MD  TRESIBA FLEXTOUCH 100 UNIT/ML FlexTouch Pen Inject into the skin.    [provider]  TRESIBA FLEXTOUCH 100 UNIT/ML FlexTouch Pen Inject into the skin. 05/30/22   [provider]  Urine Glucose-Ketones Test (KETO-DIASTIX) STRP 1 strip by Misc.(Non-Drug; Combo Route) route 2 (two) times daily as needed. Use to check urine for ketones  with high blood glucose 09/01/17   [provider]   Social History   Socioeconomic History   Marital status: Married    Spouse name: Not on file   Number of children: Not on file   Years of education: Not on file   Highest education level: Not on file  Occupational History   Not on file  Tobacco Use   Smoking status: Never   Smokeless tobacco: Never  Substance and Sexual Activity   Alcohol use: No   Drug use: No   Sexual activity: Yes    Birth control/protection: None  Other Topics Concern   Not on file  Social History Narrative   Not on file   Social Determinants of Health   Financial Resource Strain: Not on file  Food Insecurity: Not on file  Transportation Needs: Not on file  Physical Activity: Not on file  Stress: Not on file  Social Connections: Not on file  Intimate Partner Violence: Not on file    Observations/Objective: Vitals:   10/31/22 1658  Temp: (!) 100.8 F (38.2 C)  TempSrc: Oral  Nontoxic appearance on video, speaking in full sentences, no respiratory distress appreciated on video, no audible stridor or wheeze.  Coherent responses, appropriate responses.  All questions were answered with understanding of plan expressed.  Assessment and Plan: COVID-19 virus infection - Plan: molnupiravir EUA (LAGEVRIO) 200 mg CAPS capsule, benzonatate (TESSALON) 100 MG capsule  -COVID-19 infection, day 1.  No red flags at this time, home treatment discussed.  Symptomatic care with expectorant, fluids, rest, vitamin C, D, zinc.  Antivirals discussed including side effects and risks as above.  Molnupiravir ordered due to interaction of Paxlovid with her medications.  Tessalon Perles also ordered if needed.  ER/urgent care precautions given, masking and isolation precautions given.  Follow Up Instructions:    I discussed the assessment and treatment plan with the patient. The patient was provided an opportunity to ask questions and all were answered. The patient  agreed with the plan and demonstrated an understanding of the instructions.   The patient was advised to call back or seek an in-person evaluation if the symptoms worsen or if the condition fails to improve as anticipated.   Wendie Agreste, MD

## 2022-12-22 DIAGNOSIS — G4730 Sleep apnea, unspecified: Secondary | ICD-10-CM | POA: Diagnosis not present

## 2022-12-22 DIAGNOSIS — I10 Essential (primary) hypertension: Secondary | ICD-10-CM | POA: Diagnosis not present

## 2023-01-01 ENCOUNTER — Other Ambulatory Visit: Payer: Self-pay | Admitting: Family Medicine

## 2023-01-01 DIAGNOSIS — M797 Fibromyalgia: Secondary | ICD-10-CM

## 2023-01-08 ENCOUNTER — Other Ambulatory Visit (HOSPITAL_COMMUNITY): Payer: Self-pay | Admitting: Cardiology

## 2023-01-16 ENCOUNTER — Other Ambulatory Visit: Payer: Self-pay | Admitting: Family Medicine

## 2023-01-16 ENCOUNTER — Other Ambulatory Visit (HOSPITAL_COMMUNITY): Payer: Self-pay | Admitting: Cardiology

## 2023-01-16 DIAGNOSIS — M797 Fibromyalgia: Secondary | ICD-10-CM

## 2023-01-16 NOTE — Telephone Encounter (Signed)
Tramadol 50 mg  LOV: 10/31/22 Last Refill:10/16/22 Upcoming appt: 04/25/23

## 2023-01-16 NOTE — Telephone Encounter (Signed)
Controlled substance database reviewed.  Tramadol No. 180 last filled 10/16/2022.  Fibromyalgia treatment discussed in December, tramadol up to twice per day.Marland Kitchen  Refill ordered.

## 2023-02-05 DIAGNOSIS — K219 Gastro-esophageal reflux disease without esophagitis: Secondary | ICD-10-CM | POA: Diagnosis not present

## 2023-02-07 DIAGNOSIS — E1169 Type 2 diabetes mellitus with other specified complication: Secondary | ICD-10-CM | POA: Diagnosis not present

## 2023-02-07 DIAGNOSIS — E11649 Type 2 diabetes mellitus with hypoglycemia without coma: Secondary | ICD-10-CM | POA: Diagnosis not present

## 2023-02-07 DIAGNOSIS — I129 Hypertensive chronic kidney disease with stage 1 through stage 4 chronic kidney disease, or unspecified chronic kidney disease: Secondary | ICD-10-CM | POA: Diagnosis not present

## 2023-02-07 DIAGNOSIS — N1831 Chronic kidney disease, stage 3a: Secondary | ICD-10-CM | POA: Diagnosis not present

## 2023-02-07 DIAGNOSIS — I43 Cardiomyopathy in diseases classified elsewhere: Secondary | ICD-10-CM | POA: Diagnosis not present

## 2023-02-07 DIAGNOSIS — E039 Hypothyroidism, unspecified: Secondary | ICD-10-CM | POA: Diagnosis not present

## 2023-02-07 DIAGNOSIS — E1165 Type 2 diabetes mellitus with hyperglycemia: Secondary | ICD-10-CM | POA: Diagnosis not present

## 2023-02-07 DIAGNOSIS — Z794 Long term (current) use of insulin: Secondary | ICD-10-CM | POA: Diagnosis not present

## 2023-02-07 DIAGNOSIS — E1122 Type 2 diabetes mellitus with diabetic chronic kidney disease: Secondary | ICD-10-CM | POA: Diagnosis not present

## 2023-02-07 DIAGNOSIS — E785 Hyperlipidemia, unspecified: Secondary | ICD-10-CM | POA: Diagnosis not present

## 2023-02-07 DIAGNOSIS — Z01419 Encounter for gynecological examination (general) (routine) without abnormal findings: Secondary | ICD-10-CM | POA: Diagnosis not present

## 2023-02-18 ENCOUNTER — Other Ambulatory Visit: Payer: Self-pay | Admitting: Family Medicine

## 2023-02-18 ENCOUNTER — Other Ambulatory Visit (HOSPITAL_COMMUNITY): Payer: Self-pay | Admitting: Cardiology

## 2023-02-18 DIAGNOSIS — M797 Fibromyalgia: Secondary | ICD-10-CM

## 2023-02-18 NOTE — Telephone Encounter (Signed)
Patient is requesting a refill of the following medications: Requested Prescriptions   Pending Prescriptions Disp Refills   cyclobenzaprine (FLEXERIL) 10 MG tablet [Pharmacy Med Name: CYCLOBENZAPRINE 10 MG TABLET] 60 tablet 1    Sig: TAKE 1 TABLET (10 MG TOTAL) BY MOUTH IN THE MORNING AND AT BEDTIME   FLUoxetine (PROZAC) 20 MG tablet [Pharmacy Med Name: FLUOXETINE HCL 20 MG TABLET] 90 tablet 1    Sig: TAKE 1 TABLET BY MOUTH EVERY DAY    Date of patient request: 02/18/2023 Last office visit: 10/18/2022 Date of last refill: 01/01/2023 Last refill amount: 60, 90

## 2023-02-19 DIAGNOSIS — Z978 Presence of other specified devices: Secondary | ICD-10-CM | POA: Diagnosis not present

## 2023-02-19 DIAGNOSIS — E1169 Type 2 diabetes mellitus with other specified complication: Secondary | ICD-10-CM | POA: Diagnosis not present

## 2023-02-19 DIAGNOSIS — I43 Cardiomyopathy in diseases classified elsewhere: Secondary | ICD-10-CM | POA: Diagnosis not present

## 2023-02-19 DIAGNOSIS — N1832 Chronic kidney disease, stage 3b: Secondary | ICD-10-CM | POA: Diagnosis not present

## 2023-02-19 DIAGNOSIS — Z794 Long term (current) use of insulin: Secondary | ICD-10-CM | POA: Diagnosis not present

## 2023-02-19 DIAGNOSIS — E1165 Type 2 diabetes mellitus with hyperglycemia: Secondary | ICD-10-CM | POA: Diagnosis not present

## 2023-02-19 DIAGNOSIS — I129 Hypertensive chronic kidney disease with stage 1 through stage 4 chronic kidney disease, or unspecified chronic kidney disease: Secondary | ICD-10-CM | POA: Diagnosis not present

## 2023-02-19 DIAGNOSIS — E11649 Type 2 diabetes mellitus with hypoglycemia without coma: Secondary | ICD-10-CM | POA: Diagnosis not present

## 2023-02-19 DIAGNOSIS — E785 Hyperlipidemia, unspecified: Secondary | ICD-10-CM | POA: Diagnosis not present

## 2023-02-19 DIAGNOSIS — E1122 Type 2 diabetes mellitus with diabetic chronic kidney disease: Secondary | ICD-10-CM | POA: Diagnosis not present

## 2023-02-19 DIAGNOSIS — E039 Hypothyroidism, unspecified: Secondary | ICD-10-CM | POA: Diagnosis not present

## 2023-04-13 ENCOUNTER — Other Ambulatory Visit (HOSPITAL_COMMUNITY): Payer: Self-pay | Admitting: Cardiology

## 2023-04-13 ENCOUNTER — Other Ambulatory Visit: Payer: Self-pay | Admitting: Family Medicine

## 2023-04-13 DIAGNOSIS — M797 Fibromyalgia: Secondary | ICD-10-CM

## 2023-04-15 NOTE — Telephone Encounter (Signed)
Pt has upcoming appt 6/27

## 2023-04-15 NOTE — Telephone Encounter (Signed)
Patient is requesting a refill of the following medications: Requested Prescriptions   Pending Prescriptions Disp Refills   gabapentin (NEURONTIN) 600 MG tablet [Pharmacy Med Name: GABAPENTIN 600 MG TABLET] 180 tablet 1    Sig: TAKE 1 TABLET BY MOUTH TWICE A DAY   cyclobenzaprine (FLEXERIL) 10 MG tablet [Pharmacy Med Name: CYCLOBENZAPRINE 10 MG TABLET] 60 tablet 1    Sig: TAKE 1 TABLET (10 MG TOTAL) BY MOUTH IN THE MORNING AND AT BEDTIME   traMADol (ULTRAM) 50 MG tablet [Pharmacy Med Name: TRAMADOL HCL 50 MG TABLET] 180 tablet 0    Sig: TAKE 1 TABLET (50 MG TOTAL) BY MOUTH EVERY 6 (SIX) HOURS AS NEEDED FOR MODERATE PAIN    Date of patient request: 04/15/23 Last office visit: 02/19/23 Date of last refill: 02/18/23 Last refill amount: 60

## 2023-04-15 NOTE — Telephone Encounter (Signed)
Meds discussed in December. Controlled substance database (PDMP) reviewed. Gabapentin 600mg  BID (#180 on 4/22- should still have 1 month left), flexeril 10mg  BID, tramadol BID (#180 on 3/20).   Will refill tramadol and flexeril. Please check with patient - should not yet need gabapentin unless dosage has changed - please clarify.

## 2023-04-25 ENCOUNTER — Encounter: Payer: Self-pay | Admitting: Family Medicine

## 2023-04-25 ENCOUNTER — Ambulatory Visit: Payer: Medicare HMO | Admitting: Family Medicine

## 2023-04-25 VITALS — BP 110/62 | HR 72 | Temp 97.9°F | Ht 65.0 in | Wt 172.0 lb

## 2023-04-25 DIAGNOSIS — I10 Essential (primary) hypertension: Secondary | ICD-10-CM | POA: Diagnosis not present

## 2023-04-25 DIAGNOSIS — E1165 Type 2 diabetes mellitus with hyperglycemia: Secondary | ICD-10-CM

## 2023-04-25 DIAGNOSIS — E785 Hyperlipidemia, unspecified: Secondary | ICD-10-CM

## 2023-04-25 DIAGNOSIS — Z1159 Encounter for screening for other viral diseases: Secondary | ICD-10-CM

## 2023-04-25 DIAGNOSIS — Z794 Long term (current) use of insulin: Secondary | ICD-10-CM

## 2023-04-25 DIAGNOSIS — Z0000 Encounter for general adult medical examination without abnormal findings: Secondary | ICD-10-CM | POA: Diagnosis not present

## 2023-04-25 DIAGNOSIS — I5022 Chronic systolic (congestive) heart failure: Secondary | ICD-10-CM

## 2023-04-25 DIAGNOSIS — E039 Hypothyroidism, unspecified: Secondary | ICD-10-CM | POA: Diagnosis not present

## 2023-04-25 DIAGNOSIS — F339 Major depressive disorder, recurrent, unspecified: Secondary | ICD-10-CM | POA: Diagnosis not present

## 2023-04-25 DIAGNOSIS — M797 Fibromyalgia: Secondary | ICD-10-CM | POA: Diagnosis not present

## 2023-04-25 DIAGNOSIS — Z862 Personal history of diseases of the blood and blood-forming organs and certain disorders involving the immune mechanism: Secondary | ICD-10-CM | POA: Diagnosis not present

## 2023-04-25 LAB — CBC WITH DIFFERENTIAL/PLATELET
Basophils Absolute: 0.1 10*3/uL (ref 0.0–0.1)
Basophils Relative: 0.9 % (ref 0.0–3.0)
Eosinophils Absolute: 0.3 10*3/uL (ref 0.0–0.7)
Eosinophils Relative: 3.7 % (ref 0.0–5.0)
HCT: 38.1 % (ref 36.0–46.0)
Hemoglobin: 12.5 g/dL (ref 12.0–15.0)
Lymphocytes Relative: 39.6 % (ref 12.0–46.0)
Lymphs Abs: 3.1 10*3/uL (ref 0.7–4.0)
MCHC: 32.8 g/dL (ref 30.0–36.0)
MCV: 93.4 fl (ref 78.0–100.0)
Monocytes Absolute: 0.5 10*3/uL (ref 0.1–1.0)
Monocytes Relative: 6 % (ref 3.0–12.0)
Neutro Abs: 3.9 10*3/uL (ref 1.4–7.7)
Neutrophils Relative %: 49.8 % (ref 43.0–77.0)
Platelets: 307 10*3/uL (ref 150.0–400.0)
RBC: 4.08 Mil/uL (ref 3.87–5.11)
RDW: 13.6 % (ref 11.5–15.5)
WBC: 7.9 10*3/uL (ref 4.0–10.5)

## 2023-04-25 MED ORDER — FLUOXETINE HCL 20 MG PO CAPS: ORAL_CAPSULE | ORAL | 1 refills | Status: DC

## 2023-04-25 MED ORDER — GABAPENTIN 600 MG PO TABS: 600.0000 mg | ORAL_TABLET | Freq: Two times a day (BID) | ORAL | 1 refills | Status: DC

## 2023-04-25 NOTE — Patient Instructions (Signed)
Thank you for coming in today.  I will check a blood count and hepatitis C test as we talked about but other labs were recently checked by your endocrinologist.  Keep follow-up with your heart specialist and endocrinology, as well as gynecology when due.  Colonoscopy next year.  I will review the disability paperwork and call you if I need further information.  I hope to have that completed in the next few days and we will let you know. No change in meds today, but if you do have persistent depression symptoms, or decreased control of fibromyalgia I would recommend meeting with a specialist.  Keep me posted.  Take care!  Preventive Care 42-53 Years Old, Female Preventive care refers to lifestyle choices and visits with your health care provider that can promote health and wellness. Preventive care visits are also called wellness exams. What can I expect for my preventive care visit? Counseling Your health care provider may ask you questions about your: Medical history, including: Past medical problems. Family medical history. Pregnancy history. Current health, including: Menstrual cycle. Method of birth control. Emotional well-being. Home life and relationship well-being. Sexual activity and sexual health. Lifestyle, including: Alcohol, nicotine or tobacco, and drug use. Access to firearms. Diet, exercise, and sleep habits. Work and work Astron[employer]. Sunscreen use. Safety issues such as seatbelt and bike helmet use. Physical exam Your health care provider will check your: Height and weight. These may be used to calculate your BMI (body mass index). BMI is a measurement that tells if you are at a healthy weight. Waist circumference. This measures the distance around your waistline. This measurement also tells if you are at a healthy weight and may help predict your risk of certain diseases, such as type 2 diabetes and high blood pressure. Heart rate and blood pressure. Body  temperature. Skin for abnormal spots. What immunizations do I need?  Vaccines are usually given at various ages, according to a schedule. Your health care provider will recommend vaccines for you based on your age, medical history, and lifestyle or other factors, such as travel or where you work. What tests do I need? Screening Your health care provider may recommend screening tests for certain conditions. This may include: Lipid and cholesterol levels. Diabetes screening. This is done by checking your blood sugar (glucose) after you have not eaten for a while (fasting). Pelvic exam and Pap test. Hepatitis B test. Hepatitis C test. HIV (human immunodeficiency virus) test. STI (sexually transmitted infection) testing, if you are at risk. Lung cancer screening. Colorectal cancer screening. Mammogram. Talk with your health care provider about when you should start having regular mammograms. This may depend on whether you have a family history of breast cancer. BRCA-related cancer screening. This may be done if you have a family history of breast, ovarian, tubal, or peritoneal cancers. Bone density scan. This is done to screen for osteoporosis. Talk with your health care provider about your test results, treatment options, and if necessary, the need for more tests. Follow these instructions at home: Eating and drinking  Eat a diet that includes fresh fruits and vegetables, whole grains, lean protein, and low-fat dairy products. Take vitamin and mineral supplements as recommended by your health care provider. Do not drink alcohol if: Your health care provider tells you not to drink. You are pregnant, may be pregnant, or are planning to become pregnant. If you drink alcohol: Limit how much you have to 0-1 drink a day. Know how much alcohol is in  your drink. In the [place] one drink equals one 12 oz bottle of beer (355 mL), one 5 oz glass of wine (148 mL), or one 1 oz glass of hard liquor (44  mL). Lifestyle Brush your teeth every morning and night with fluoride toothpaste. Floss one time each day. Exercise for at least 30 minutes 5 or more days each week. Do not use any products that contain nicotine or tobacco. These products include cigarettes, chewing tobacco, and vaping devices, such as e-cigarettes. If you need help quitting, ask your health care provider. Do not use drugs. If you are sexually active, practice safe sex. Use a condom or other form of protection to prevent STIs. If you do not wish to become pregnant, use a form of birth control. If you plan to become pregnant, see your health care provider for a prepregnancy visit. Take aspirin only as told by your health care provider. Make sure that you understand how much to take and what form to take. Work with your health care provider to find out whether it is safe and beneficial for you to take aspirin daily. Find healthy ways to manage stress, such as: Meditation, yoga, or listening to music. Journaling. Talking to a trusted person. Spending time with friends and family. Minimize exposure to UV radiation to reduce your risk of skin cancer. Safety Always wear your seat belt while driving or riding in a vehicle. Do not drive: If you have been drinking alcohol. Do not ride with someone who has been drinking. When you are tired or distracted. While texting. If you have been using any mind-altering substances or drugs. Wear a helmet and other protective equipment during sports activities. If you have firearms in your house, make sure you follow all gun safety procedures. Seek help if you have been physically or sexually abused. What's next? Visit your health care provider once a year for an annual wellness visit. Ask your health care provider how often you should have your eyes and teeth checked. Stay up to date on all vaccines. This information is not intended to replace advice given to you by your health care  provider. Make sure you discuss any questions you have with your health care provider. Document Revised: 04/12/2021 Document Reviewed: 04/12/2021 Elsevier Patient Education  2024 [employer].

## 2023-04-25 NOTE — Progress Notes (Signed)
Subjective:  Patient ID: Robin Owen, female    DOB: Jun 15, 1961  Age: 62 y.o. MRN: [id number]  CC:  Chief Complaint  Patient presents with   Annual Exam    Pt did receive forms for Disability she needs filled out soon     HPI Robin Owen presents for Annual Exam  And follow-up of chronic conditions.  Hypertension, CHF.  With chronic systolic heart failure, pulmonary hypertension followed by cardiology.  Visit noted from December 2023.  Dyspnea with walking stairs.  NYHA class II symptoms chronically.  Also on CPAP for OSA.  Treated with Lasix 40 mg twice daily, spironolactone 25 mg daily, Coreg 25 mg twice daily, Entresto 49/51 mg twice daily, corlanor 5 mg twice daily.  Off SGLT2 with recurrent yeast infection.  Short of breath with activity - going up inclines. Ok with flat walking.  Has disability paperwork to complete today.previously completed by Dr. Leslie Owen prior - copy provided.  No new med side effects.  BP Readings from Last 3 Encounters:  04/25/23 110/62  10/18/22 120/68  10/02/22 108/64   Lab Results  Component Value Date   CREATININE 1.26 (H) 10/02/2022   Diabetes: With chronic kidney disease, hyperglycemia, followed by endocrinology Dr. Janee Owen. Treated with semaglutide, Evaristo Bury, metformin, off SGLT2 due to mycotic infections. Increased tresiba at 4/23 visit - now on 20u. Same dose of ozempic, metformin. 3 month follow up.  Microalbumin: Urine albumin on 05/29/2022. Optho, foot exam, pneumovax:  Ophthalmology: appt in past year - Dr. Dione Owen.  Lipitor 20mg  every day - no new side effects.  Borderline anemia in past - no recent CBC. Other labs 4/11 at endocrinology. On fusion plus supplement.   Lab Results  Component Value Date   HGBA1C 8.1 (H) 03/21/2016   Lab Results  Component Value Date   LDLCALC 92 06/02/2020   CREATININE 1.26 (H) 10/02/2022   Hypothyroidism: Lab Results  Component Value Date   TSH 1.990 09/16/2015  Also followed by  endocrinology.  Treated with Synthroid 112 mcg daily. Last appt in April. Reviewed.   Fibromyalgia With depression.  Treated with fluoxetine 20 mg daily for depression symptoms. Doing ok. Discussed PHQ.  Declines need for therapist or med changes at this time. Option to meet woth psychiatry - declined at this time.  Gabapentin 600 mg twice daily along with Flexeril 10 mg twice daily, tramadol for breakthrough pain, typically twice daily dosing on tramadol.  Managing symptoms. Hips and thighs are typical areas of soreness. No recent changes. Controlled substance database reviewed.  Tramadol No. 180 last filled on 04/15/2023, gabapentin No. 180 last filled on 02/18/2023.     04/25/2023   10:35 AM 10/18/2022   10:07 AM 05/10/2022   10:39 AM 11/02/2021   10:23 AM 07/27/2021    3:15 PM  Depression screen PHQ 2/9  Decreased Interest 2 1 1  0 0  Down, Depressed, Hopeless 2 1 1  0 0  PHQ - 2 Score 4 2 2  0 0  Altered sleeping 2 0 2 0   Tired, decreased energy 3 2 2 3    Change in appetite 1 0 1 0   Feeling bad or failure about yourself  2 0 1 0   Trouble concentrating 2 0 2 1   Moving slowly or fidgety/restless 0 0 0 0   Suicidal thoughts 0 0 0 0   PHQ-9 Score 14 4 10 4      Health Maintenance  Topic Date Due   Medicare Annual Wellness (  AWV)  Never done   Zoster Vaccines- Shingrix (1 of 2) 10/08/2013   COVID-19 Vaccine (4 - 2023-24 season) 06/29/2022   HEMOGLOBIN A1C  11/29/2022   OPHTHALMOLOGY EXAM  02/07/2023   Colonoscopy  05/11/2023 (Originally 10/08/2008)   Hepatitis C Screening  05/11/2023 (Originally 10/08/1981)   Diabetic kidney evaluation - Urine ACR  05/30/2023 (Originally 10/08/1981)   INFLUENZA VACCINE  05/30/2023   Diabetic kidney evaluation - eGFR measurement  10/03/2023   FOOT EXAM  10/19/2023   MAMMOGRAM  10/17/2024   PAP SMEAR-Modifier  01/09/2025   HIV Screening  Completed   HPV VACCINES  Aged Out   DTaP/Tdap/Td  Discontinued  Colon cancer screening: Colonoscopy at age 67  with Dr. Kinnie Owen.  Plan for repeat in 5 years, 2025.  Gastroenterology visit April 9.  GERD.  Consider EGD at time of colonoscopy in 2025. Mammogram 10/26/2022 Pap test 01/24/2022, negative, reactive changes.  [facility].  Dr. Aldona Owen.   Hep C screening: agrees to test today.    Immunization History  Administered Date(s) Administered   Influenza,inj,Quad PF,6+ Mos 07/27/2021, 09/04/2022   Moderna Sars-Covid-2 Vaccination 01/16/2020, 02/19/2020, 09/17/2020   Zoster, Unspecified 09/19/2022  Shingrix: had at pharmacy x2 and tetanus - obtaining records.  COVID booster: recommended with flu vaccine in fall.   No results found. Dr. Dione Owen in past year.   Dental: every 6 months. [place].   Alcohol: none  Tobacco: none  Exercise: walking 3 days per week. Slow walk - limited by fibromyalgia, chf.    History Patient Active Problem List   Diagnosis Date Noted   Pyelonephritis 03/21/2016   Sepsis (HCC) 03/21/2016   Hyponatremia 03/21/2016   Type 2 diabetes mellitus without complication, without long-term current use of insulin (HCC) 03/21/2016   Normocytic anemia 03/21/2016   Chronic systolic heart failure (HCC) 10/02/2015   Nonischemic cardiomyopathy (HCC)    Pulmonary hypertension (HCC)    Hyperlipidemia 09/14/2015   Essential hypertension 09/14/2015   Fibromyalgia 09/14/2015   Hypothyroidism 09/14/2015   Past Medical History:  Diagnosis Date   Anemia    CHF (congestive heart failure) (HCC)    Depression    Diabetes mellitus without complication (HCC)    Fibromyalgia    Hyperlipidemia    Hypertension    Hypothyroidism    Past Surgical History:  Procedure Laterality Date   CARDIAC CATHETERIZATION N/A 09/16/2015   Procedure: Right/Left Heart Cath and Coronary Angiography;  Surgeon: Peter M Swaz[place], MD;  Location: [facility];  Service: Cardiovascular;  Laterality: N/A;   ENDOMETRIAL ABLATION  2012   Allergies  Allergen Reactions   Nitrofurantoin Other  (See Comments)    Fever, flu like symtoms   Prior to Admission medications   Medication Sig Start Date End Date Taking? Authorizing Provider  acetaminophen (TYLENOL) 325 MG tablet Take 650 mg by mouth every 6 (six) hours as needed for mild pain.    [provider]  aspirin 81 MG EC tablet Adult Low Dose Aspirin 81 mg tablet,delayed release  Take 1 tablet every day by oral route.    [provider]  atorvastatin (LIPITOR) 20 MG tablet Take 20 mg by mouth daily. 01/08/20   [provider]  atorvastatin (LIPITOR) 20 MG tablet Take 1 tablet by mouth daily.    [provider]  atorvastatin (LIPITOR) 40 MG tablet TAKE 1 TABLET BY MOUTH EVERY DAY 02/18/23   Laurey Morale, MD  carvedilol (COREG) 25 MG tablet Take 1 tablet by mouth  2 (two) times daily with a meal.    [provider]  carvedilol (COREG) 25 MG tablet TAKE 1 TABLET BY MOUTH TWICE A DAY WITH FOOD 01/17/23   Laurey Morale, MD  cephALEXin (KEFLEX) 500 MG capsule Take 1 capsule (500 mg total) by mouth 2 (two) times daily. Patient not taking: Reported on 10/18/2022 07/18/22   Roxy Horseman, PA-C  cephALEXin (KEFLEX) 500 MG capsule cephalexin 500 mg capsule  Take 1 capsule 3 times a day by oral route for 3 days.    [provider]  Cholecalciferol 25 MCG (1000 UT) tablet Take 1,000 Units by mouth daily.    [provider]  Continuous Blood Gluc Sensor (FREESTYLE LIBRE 2 SENSOR) MISC APPLY SENSOR AS DIRECTED EVERY 14 DAYS 09/07/21   [provider]  CORLANOR 5 MG TABS tablet TAKE 1 TABLET BY MOUTH TWICE A DAY WITH MEALS 10/19/22   Laurey Morale, MD  cyclobenzaprine (FLEXERIL) 10 MG tablet TAKE 1 TABLET (10 MG TOTAL) BY MOUTH IN THE MORNING AND AT BEDTIME 04/15/23   Shade Flood, MD  cyclobenzaprine (FLEXERIL) 5 MG tablet cyclobenzaprine 5 mg tablet  TAKE 1 TABLET (5 MG TOTAL) BY MOUTH IN THE MORNING AND AT BEDTIME    [provider]  ENTRESTO 49-51 MG  TAKE 1 TABLET BY MOUTH TWICE A DAY 04/15/23   Laurey Morale, MD  famotidine (PEPCID) 40 MG tablet Take 40 mg by mouth at bedtime. 05/10/20   [provider]  famotidine (PEPCID) 40 MG tablet Take by mouth. 10/05/22   [provider]  FLUoxetine (PROZAC) 20 MG capsule  10/06/20   [provider]  FLUoxetine (PROZAC) 20 MG tablet TAKE 1 TABLET BY MOUTH EVERY DAY 02/18/23   Shade Flood, MD  furosemide (LASIX) 40 MG tablet Take 1 tablet (40 mg total) by mouth 2 (two) times daily. 09/24/22   Laurey Morale, MD  furosemide (LASIX) 40 MG tablet Take 1 tablet (40 mg total) by mouth 2 (two) times daily. 10/19/22   Bensimhon, Bevelyn Buckles, MD  gabapentin (NEURONTIN) 600 MG tablet Take 1 tablet (600 mg total) by mouth 2 (two) times daily. 10/18/22   Shade Flood, MD  Insulin Degludec (TRESIBA [place]) Inject 20 Units into the skin daily.    [provider]  Iron-FA-B Cmp-C-Biot-Probiotic (FUSION PLUS) CAPS Take 1 capsule by mouth daily. Take 1 capsule by mouth daily. 10/18/22   Shade Flood, MD  ivabradine (CORLANOR) 5 MG TABS tablet Take 1 tablet by mouth 2 (two) times daily with a meal.    [provider]  levothyroxine (SYNTHROID) 112 MCG tablet Take 112 mcg by mouth daily before breakfast.    [provider]  metFORMIN (GLUCOPHAGE) 1000 MG tablet Take 1 tablet (1,000 mg total) by mouth 2 (two) times daily with a meal. 09/17/15   Calvert Cantor, MD  Multiple Vitamin (MULTIVITAMIN WITH MINERALS) TABS tablet Take 1 tablet by mouth daily.    [provider]  NOVOTWIST 32G X 5 MM MISC Use as directed with Victozia. 08/19/15   [provider]  Lum Babe test strip Check blood glucose once daily 08/19/15   [provider]  OZEMPIC, 1 MG/DOSE, 4 MG/3ML SOPN Inject 1 mg into the skin once a week.    [provider]  OZEMPIC, 1 MG/DOSE, 4 MG/3ML SOPN Inject 1 mg into the skin once a week. 05/14/22   [provider]  Semaglutide (OZEMPIC, 1 MG/DOSE,  [place]) Inject 1 mg into the skin once a week.    [provider]  Semaglutide,0.25 or 0.5MG /DOS, (OZEMPIC, 0.25 OR 0.5 MG/DOSE,) 2 MG/1.5ML SOPN Ozempic 0.25 mg or 0.5 mg (2 mg/1.5 mL) subcutaneous pen injector    [provider]  spironolactone (ALDACTONE) 25 MG tablet Take 1 tablet by mouth daily.    [provider]  spironolactone (ALDACTONE) 25 MG tablet TAKE 1 TABLET BY MOUTH EVERY DAY 01/17/23   Laurey Morale, MD  traMADol (ULTRAM) 50 MG tablet TAKE 1 TABLET (50 MG TOTAL) BY MOUTH EVERY 6 (SIX) HOURS AS NEEDED FOR MODERATE PAIN 04/15/23   Shade Flood, MD  TRESIBA FLEXTOUCH 100 UNIT/ML FlexTouch Pen Inject into the skin.    [provider]  TRESIBA FLEXTOUCH 100 UNIT/ML FlexTouch Pen Inject into the skin. 05/30/22   [provider]  Urine Glucose-Ketones Test (KETO-DIASTIX) STRP 1 strip by Misc.(Non-Drug; Combo Route) route 2 (two) times daily as needed. Use to check urine for ketones with high blood glucose 09/01/17   [provider]   Social History   Socioeconomic History   Marital status: Married    Spouse name: Not on file   Number of children: Not on file   Years of education: Not on file   Highest education level: Not on file  Occupational History   Not on file  Tobacco Use   Smoking status: Never   Smokeless tobacco: Never  Substance and Sexual Activity   Alcohol use: No   Drug use: No   Sexual activity: Yes    Birth control/protection: None  Other Topics Concern   Not on file  Social History Narrative   Not on file   Social Determinants of Health   Financial Resource Strain: Not on file  Food Insecurity: Not on file  Transportation Needs: Not on file  Physical Activity: Not on file  Stress: Not on file  Social Connections: Not on file  Intimate Partner Violence: Not on file    Review of Systems13 point review of systems per patient health survey noted.   Negative other than as indicated above or in HPI.    Objective:   Vitals:   04/25/23 1031  BP: 110/62  Pulse: 72  Temp: 97.9 F (36.6 C)  TempSrc: Temporal  SpO2: 97%  Weight: 172 lb (78 kg)  Height: 5\' 5"  (1.651 m)     Physical Exam Constitutional:      Appearance: She is well-developed.  HENT:     Head: Normocephalic and atraumatic.     Right Ear: External ear normal.     Left Ear: External ear normal.  Eyes:     Conjunctiva/sclera: Conjunctivae normal.     Pupils: Pupils are equal, round, and reactive to light.  Neck:     Thyroid: No thyromegaly.  Cardiovascular:     Rate and Rhythm: Normal rate and regular rhythm.     Heart sounds: Normal heart sounds. No murmur heard. Pulmonary:     Effort: Pulmonary effort is normal. No respiratory distress.     Breath sounds: Normal breath sounds. No wheezing.  Abdominal:     General: Bowel sounds are normal.     Palpations: Abdomen is soft.     Tenderness: There is no abdominal tenderness.  Musculoskeletal:        General: No tenderness. Normal range of motion.     Cervical back: Normal range of motion and neck supple.     Comments: Hip internal,  external rotation pain-free.  Lymphadenopathy:     Cervical: No cervical adenopathy.  Skin:    General: Skin is warm and dry.     Findings: No rash.  Neurological:     Mental Status: She is alert and oriented to person, place, and time.  Psychiatric:        Behavior: Behavior normal.        Thought Content: Thought content normal.     Assessment & Plan:  ARVIS GERVASI is a 62 y.o. female . Annual physical exam - Plan: CBC with Differential/Platelet  - -anticipatory guidance as below in AVS, screening labs above. Health maintenance items as above in HPI discussed/recommended as applicable.   Fibromyalgia - Plan: gabapentin (NEURONTIN) 600 MG tablet  -Reports overall stable control with current regimen of Cymbalta, gabapentin, tramadol.  Continue same.  Option to meet  with physical medicine and rehab or psychiatry to adjust medication regimen if worsening symptoms.  Hypothyroidism, unspecified type  -Recent labs, followed by endocrinology, continue follow-up with specialist.  Essential hypertension - Plan: CBC with Differential/Platelet  -Stable in office, no med changes at this time.  Check monitoring labs.  Type 2 diabetes mellitus with hyperglycemia, with long-term current use of insulin (HCC)  -Followed by endocrinology, continue same.  Hyperlipidemia, unspecified hyperlipidemia type  -Tolerating Lipitor, continue same.  Recent lipid panel in April.  History of anemia  -Check updated CBC.  Need for hepatitis C screening test - Plan: Hepatitis C antibody  Depression, recurrent (HCC) - Plan: FLUoxetine (PROZAC) 20 MG capsule  -As above reports overall stable symptoms, declines any med changes at this time.  RTC precautions given.  Chronic systolic heart failure (HCC)  -NYHA class II symptoms as above, also some limitations in activity with her fibromyalgia.  Continue follow-up with cardiology, reports overall stable symptoms at this time.  Paperwork reviewed, discussed specific questions with patient and family and completed new disability paperwork, will have ready for pickup.  Meds ordered this encounter  Medications   gabapentin (NEURONTIN) 600 MG tablet    Sig: Take 1 tablet (600 mg total) by mouth 2 (two) times daily.    Dispense:  180 tablet    Refill:  1   FLUoxetine (PROZAC) 20 MG capsule    Sig: 1 tablet daily    Dispense:  90 capsule    Refill:  1   Patient Instructions  Thank you for coming in today.  I will check a blood count and hepatitis C test as we talked about but other labs were recently checked by your endocrinologist.  Keep follow-up with your heart specialist and endocrinology, as well as gynecology when due.  Colonoscopy next year.  I will review the disability paperwork and call you if I need further information.  I  hope to have that completed in the next few days and we will let you know. No change in meds today, but if you do have persistent depression symptoms, or decreased control of fibromyalgia I would recommend meeting with a specialist.  Keep me posted.  Take care!  Preventive Care 35-44 Years Old, Female Preventive care refers to lifestyle choices and visits with your health care provider that can promote health and wellness. Preventive care visits are also called wellness exams. What can I expect for my preventive care visit? Counseling Your health care provider may ask you questions about your: Medical history, including: Past medical problems. Family medical history. Pregnancy history. Current health, including: Menstrual cycle. Method of birth  control. Emotional well-being. Home life and relationship well-being. Sexual activity and sexual health. Lifestyle, including: Alcohol, nicotine or tobacco, and drug use. Access to firearms. Diet, exercise, and sleep habits. Work and work Astronomer. Sunscreen use. Safety issues such as seatbelt and bike helmet use. Physical exam Your health care provider will check your: Height and weight. These may be used to calculate your BMI (body mass index). BMI is a measurement that tells if you are at a healthy weight. Waist circumference. This measures the distance around your waistline. This measurement also tells if you are at a healthy weight and may help predict your risk of certain diseases, such as type 2 diabetes and high blood pressure. Heart rate and blood pressure. Body temperature. Skin for abnormal spots. What immunizations do I need?  Vaccines are usually given at various ages, according to a schedule. Your health care provider will recommend vaccines for you based on your age, medical history, and lifestyle or other factors, such as travel or where you work. What tests do I need? Screening Your health care provider may recommend  screening tests for certain conditions. This may include: Lipid and cholesterol levels. Diabetes screening. This is done by checking your blood sugar (glucose) after you have not eaten for a while (fasting). Pelvic exam and Pap test. Hepatitis B test. Hepatitis C test. HIV (human immunodeficiency virus) test. STI (sexually transmitted infection) testing, if you are at risk. Lung cancer screening. Colorectal cancer screening. Mammogram. Talk with your health care provider about when you should start having regular mammograms. This may depend on whether you have a family history of breast cancer. BRCA-related cancer screening. This may be done if you have a family history of breast, ovarian, tubal, or peritoneal cancers. Bone density scan. This is done to screen for osteoporosis. Talk with your health care provider about your test results, treatment options, and if necessary, the need for more tests. Follow these instructions at home: Eating and drinking  Eat a diet that includes fresh fruits and vegetables, whole grains, lean protein, and low-fat dairy products. Take vitamin and mineral supplements as recommended by your health care provider. Do not drink alcohol if: Your health care provider tells you not to drink. You are pregnant, may be pregnant, or are planning to become pregnant. If you drink alcohol: Limit how much you have to 0-1 drink a day. Know how much alcohol is in your drink. In the [place] one drink equals one 12 oz bottle of beer (355 mL), one 5 oz glass of wine (148 mL), or one 1 oz glass of hard liquor (44 mL). Lifestyle Brush your teeth every morning and night with fluoride toothpaste. Floss one time each day. Exercise for at least 30 minutes 5 or more days each week. Do not use any products that contain nicotine or tobacco. These products include cigarettes, chewing tobacco, and vaping devices, such as e-cigarettes. If you need help quitting, ask your health care  provider. Do not use drugs. If you are sexually active, practice safe sex. Use a condom or other form of protection to prevent STIs. If you do not wish to become pregnant, use a form of birth control. If you plan to become pregnant, see your health care provider for a prepregnancy visit. Take aspirin only as told by your health care provider. Make sure that you understand how much to take and what form to take. Work with your health care provider to find out whether it is safe and beneficial for  you to take aspirin daily. Find healthy ways to manage stress, such as: Meditation, yoga, or listening to music. Journaling. Talking to a trusted person. Spending time with friends and family. Minimize exposure to UV radiation to reduce your risk of skin cancer. Safety Always wear your seat belt while driving or riding in a vehicle. Do not drive: If you have been drinking alcohol. Do not ride with someone who has been drinking. When you are tired or distracted. While texting. If you have been using any mind-altering substances or drugs. Wear a helmet and other protective equipment during sports activities. If you have firearms in your house, make sure you follow all gun safety procedures. Seek help if you have been physically or sexually abused. What's next? Visit your health care provider once a year for an annual wellness visit. Ask your health care provider how often you should have your eyes and teeth checked. Stay up to date on all vaccines. This information is not intended to replace advice given to you by your health care provider. Make sure you discuss any questions you have with your health care provider. Document Revised: 04/12/2021 Document Reviewed: 04/12/2021 Elsevier Patient Education  2024 Elsevier Inc.     Signed,   Meredith Staggers, MD [facility], [facility] 04/25/23 11:17 AM

## 2023-04-26 ENCOUNTER — Encounter: Payer: Self-pay | Admitting: Family Medicine

## 2023-04-26 LAB — HEPATITIS C ANTIBODY: Hepatitis C Ab: NONREACTIVE

## 2023-04-26 NOTE — Telephone Encounter (Signed)
Called patient, forms completed, ready for pickup.  Placed in fax bin at back nurse station.  Thanks

## 2023-05-07 ENCOUNTER — Encounter (HOSPITAL_COMMUNITY): Payer: Self-pay | Admitting: Cardiology

## 2023-05-07 ENCOUNTER — Ambulatory Visit (HOSPITAL_COMMUNITY)
Admission: RE | Admit: 2023-05-07 | Discharge: 2023-05-07 | Disposition: A | Discharge status: Home / Self Care | Payer: Medicare HMO | Source: Ambulatory Visit | Attending: Cardiology | Admitting: Cardiology

## 2023-05-07 VITALS — BP 100/70 | HR 66 | Wt 171.4 lb

## 2023-05-07 DIAGNOSIS — G4733 Obstructive sleep apnea (adult) (pediatric): Secondary | ICD-10-CM | POA: Diagnosis not present

## 2023-05-07 DIAGNOSIS — I428 Other cardiomyopathies: Secondary | ICD-10-CM | POA: Insufficient documentation

## 2023-05-07 DIAGNOSIS — M797 Fibromyalgia: Secondary | ICD-10-CM | POA: Diagnosis not present

## 2023-05-07 DIAGNOSIS — I444 Left anterior fascicular block: Secondary | ICD-10-CM | POA: Insufficient documentation

## 2023-05-07 DIAGNOSIS — Z7984 Long term (current) use of oral hypoglycemic drugs: Secondary | ICD-10-CM | POA: Insufficient documentation

## 2023-05-07 DIAGNOSIS — E039 Hypothyroidism, unspecified: Secondary | ICD-10-CM | POA: Insufficient documentation

## 2023-05-07 DIAGNOSIS — E875 Hyperkalemia: Secondary | ICD-10-CM | POA: Diagnosis not present

## 2023-05-07 DIAGNOSIS — Z79890 Hormone replacement therapy (postmenopausal): Secondary | ICD-10-CM | POA: Insufficient documentation

## 2023-05-07 DIAGNOSIS — E119 Type 2 diabetes mellitus without complications: Secondary | ICD-10-CM | POA: Diagnosis not present

## 2023-05-07 DIAGNOSIS — Z794 Long term (current) use of insulin: Secondary | ICD-10-CM | POA: Diagnosis not present

## 2023-05-07 DIAGNOSIS — I110 Hypertensive heart disease with heart failure: Secondary | ICD-10-CM | POA: Diagnosis not present

## 2023-05-07 DIAGNOSIS — E785 Hyperlipidemia, unspecified: Secondary | ICD-10-CM | POA: Diagnosis not present

## 2023-05-07 DIAGNOSIS — I5022 Chronic systolic (congestive) heart failure: Secondary | ICD-10-CM | POA: Insufficient documentation

## 2023-05-07 DIAGNOSIS — B379 Candidiasis, unspecified: Secondary | ICD-10-CM | POA: Insufficient documentation

## 2023-05-07 DIAGNOSIS — Z7982 Long term (current) use of aspirin: Secondary | ICD-10-CM | POA: Insufficient documentation

## 2023-05-07 DIAGNOSIS — Z79899 Other long term (current) drug therapy: Secondary | ICD-10-CM | POA: Diagnosis not present

## 2023-05-07 DIAGNOSIS — F32A Depression, unspecified: Secondary | ICD-10-CM | POA: Insufficient documentation

## 2023-05-07 LAB — BASIC METABOLIC PANEL
Anion gap: 15 (ref 5–15)
BUN: 31 mg/dL — ABNORMAL HIGH (ref 6–20)
CO2: 29 mmol/L (ref 22–32)
Calcium: 9.8 mg/dL (ref 8.9–10.3)
Chloride: 95 mmol/L — ABNORMAL LOW (ref 98–111)
Creatinine, Ser: 1.54 mg/dL — ABNORMAL HIGH (ref 0.44–1.00)
GFR, Estimated: 39 mL/min — ABNORMAL LOW (ref >60)
Glucose, Bld: 98 mg/dL (ref 70–99)
Potassium: 4.9 mmol/L (ref 3.5–5.1)
Sodium: 139 mmol/L (ref 135–145)

## 2023-05-07 LAB — BRAIN NATRIURETIC PEPTIDE: B Natriuretic Peptide: 50.7 pg/mL (ref 0.0–100.0)

## 2023-05-07 NOTE — Patient Instructions (Signed)
There has been no changes to your medications.  Labs done today, your results will be available in MyChart, we will contact you for abnormal readings.  Repeat blood work in 3 months.  Your physician has requested that you have an echocardiogram. Echocardiography is a painless test that uses sound waves to create images of your heart. It provides your doctor with information about the size and shape of your heart and how well your heart's chambers and valves are working. This procedure takes approximately one hour. There are no restrictions for this procedure. Please do NOT wear cologne, perfume, aftershave, or lotions (deodorant is allowed). Please arrive 15 minutes prior to your appointment time.  Your physician recommends that you schedule a follow-up appointment in: 6 months ( January 2025) ** please call the office in November to arrange your follow up appointment. **  If you have any questions or concerns before your next appointment please send [place] a message through [place] or call our office at [phone].    TO LEAVE A MESSAGE FOR THE NURSE SELECT OPTION 2, PLEASE LEAVE A MESSAGE INCLUDING: YOUR NAME DATE OF BIRTH CALL BACK NUMBER REASON FOR CALL**this is important as we prioritize the call backs  YOU WILL RECEIVE A CALL BACK THE SAME DAY AS LONG AS YOU CALL BEFORE 4:00 PM  At the [facility], you and your health needs are our priority. As part of our continuing mission to provide you with exceptional heart care, we have created designated Provider Care Teams. These Care Teams include your primary Cardiologist (physician) and Advanced Practice Providers (APPs- Physician Assistants and Nurse Practitioners) who all work together to provide you with the care you need, when you need it.   You may see any of the following providers on your designated Care Team at your next follow up: Dr Arvilla Meres Dr Marca Ancona Dr. Marcos Eke, NP Robbie Lis, [facility] [place], [place] Brynda Peon, NP Karle Plumber, PharmD   Please be sure to bring in all your medications bottles to every appointment.    Thank you for choosing [facility]

## 2023-05-07 NOTE — Progress Notes (Signed)
Date:  05/07/2023   ID:  Robin Owen, DOB January 18, 1961, MRN [id number]   Provider location: [place] [facility] [facility] Type of Visit: Established patient  PCP:  Shade Flood, MD  Cardiologist:  Dr. Shirlee Latch  Chief Complaint: Shortness of breath   History of Present Illness: Robin Owen is a 62 y.o. female who has a past medical history of HTN, HLD, DMII, hypothyroidism and fibromyalgia (on disability), and CHF.    Admitted 11/16 through 09/17/15 with increased dyspnea. CXR with pulmonary edema. This prompted and ECHO that showed reduced EF 20%. RHC/LHC showed normal coronaries and reduced cardiac index. Cardiac MRI showed some mid-wall late gadolinium enhancement in the septum.  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 She has been lisinopril for some time. Started on coreg and lasix.  Discharge weight was 163 pounds.    She developed AKI and hyperkalemia in the setting of high dose Ibuprofen.  I asked her to stop this and started her on tramadol as needed instead.     Echo in 11/18 showed increase in EF to 50-55%.       Echo 10/24/18 LVEF 45-50%, Grade 1 DD, Mild MR, PA peak pressure 24 mm Hg. Echo in 8/21 showed EF 50-55%, basal inferior hypokinesis, normal RV.  Echo in 11/22 showed EF 50-55%, normal RV, normal IVC.    She returns today for followup of CHF.  She is generally doing well.  She is short of breath if she walks too fast up stairs or inclines.  No orthopnea/PND.  No dyspnea walking on flat ground.  No chest pain.   ECG (personally reviewed): NSR, LAFB, poor RWP   Test/Procedures  09/15/2015 ECHO EF 20% MV mild regurgitation TV mild regurgitation   09/19/2015 CMRI 1. Severely dilated left ventricle with normal wall thickness and severe systolic dysfunction (LVEF 20-25%) with global hypokinesis and akinesis of the entire septal wall and diffuse mid wall late gadolinium enhancement.  2. Moderate RV dilatation with mild  moderate systolic dysfunction.  3. Moderate left  and right atrial dilatation.  4. Mild to moderate mitral and mild tricuspid regurgitation.  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   RHC/LHC 09/16/2015  RA 7 PCWP 29 CO 3.11 CI 1.7  Normal Cors   Echo (5/17) with EF 35-40%.    Echo (12/17) with EF 35-40%, mild LV dilation.    CPX (12/17) with RER 1.13, peak VO2 20.8, VE/VCO2 slope 30.   Echo (11/18): EF 50-55%.   Echo (12/19): LVEF 45-50%, Grade 1 DD, Mild MR, PA peak pressure 24 mm Hg  Echo (8/21): EF 50-55%, basal inferior hypokinesis, normal RV size and systolic function.   Echo (11/22): EF 50-55%, normal RV, normal IVC   Labs 09/15/2015: BNP 2354 Labs 09/16/2015: TSH 1.99 Labs 09/17/2015: K 3.8 Creatinine 0.91 Labs 09/29/2015: K 4.5 Creatinine 1.09 Labs 10/05/2015: K 5.3 Creatinine 1.21, LDL 74, HCT 37.2 Labs 1/17: SPEP negative Labs 2/17: K 3.9, creatinine 1.34, digoxin 0.7, hemoglobin 12.5 Labs 3/17: K 3.9, creatinine 1.25, hemoglobin 10 Labs 4/17: K 4.2, creatinine 1.15 Labs 5/17: K 3.8, creatinine 1.11, HCT 31.7 Labs 6/17: K 5.1, creatinine 0.98, BNP 45 Labs 12/17: K 5, creatinine 1.17, BNP 35 Labs 3/18: K 3.9, creatinine 1.07 Labs 8/18: K 4.3, creatinine 0.94 Labs 2/19: K 5.7, creatinine 1.39, Na 125, hgb 12.6, LDL 102 Labs 5/19: K 4.7, creatinine 1.12 Labs 12/19: K 4.1, creatinine 1.09 Labs 5/20: creatinine 1.13 Labs 7/20: LDL  83, HDL 53, LFTs normal Labs 9/20: K 4.6, creatinine 1.2 Labs 2/21: K 4.6, creatinine 1.35 Labs 3/22: K 4.7, creatinine 1.18, LDL 88, HDL 45 Labs 9/22: LDL 75, K 5, creatinine 1.28 Labs 1/23: LDL 74, K 5.3, creatinine 1.4 Labs 12/23: K 4.2, creatinine 1.26th  Past Medical History:  Diagnosis Date   Anemia    CHF (congestive heart failure) (HCC)    Depression    Diabetes mellitus without complication (HCC)    Fibromyalgia    Hyperlipidemia    Hypertension    Hypothyroidism      Current Outpatient  Medications  Medication Sig Dispense Refill   acetaminophen (TYLENOL) 325 MG tablet Take 650 mg by mouth every 6 (six) hours as needed for mild pain.     aspirin 81 MG EC tablet Adult Low Dose Aspirin 81 mg tablet,delayed release  Take 1 tablet every day by oral route.     atorvastatin (LIPITOR) 20 MG tablet Take 20 mg by mouth daily.     carvedilol (COREG) 25 MG tablet Take 1 tablet by mouth 2 (two) times daily with a meal.     Cholecalciferol 25 MCG (1000 UT) tablet Take 1,000 Units by mouth daily.     Continuous Glucose Sensor (DEXCOM G7 SENSOR) MISC by Does not apply route.     CORLANOR 5 MG TABS tablet TAKE 1 TABLET BY MOUTH TWICE A DAY WITH MEALS 180 tablet 3   cyclobenzaprine (FLEXERIL) 10 MG tablet TAKE 1 TABLET (10 MG TOTAL) BY MOUTH IN THE MORNING AND AT BEDTIME 60 tablet 1   ENTRESTO 49-51 MG TAKE 1 TABLET BY MOUTH TWICE A DAY 180 tablet 3   famotidine (PEPCID) 40 MG tablet Take 40 mg by mouth at bedtime.     FLUoxetine (PROZAC) 20 MG capsule 1 tablet daily 90 capsule 1   furosemide (LASIX) 40 MG tablet Take 1 tablet (40 mg total) by mouth 2 (two) times daily. 180 tablet 3   gabapentin (NEURONTIN) 600 MG tablet Take 1 tablet (600 mg total) by mouth 2 (two) times daily. 180 tablet 1   Insulin Degludec (TRESIBA [place]) Inject 20 Units into the skin daily.     Iron-FA-B Cmp-C-Biot-Probiotic (FUSION PLUS) CAPS Take 1 capsule by mouth daily. Take 1 capsule by mouth daily. 90 capsule 3   levothyroxine (SYNTHROID) 112 MCG tablet Take 112 mcg by mouth daily before breakfast.     metFORMIN (GLUCOPHAGE) 1000 MG tablet Take 1 tablet (1,000 mg total) by mouth 2 (two) times daily with a meal.     Multiple Vitamin (MULTIVITAMIN WITH MINERALS) TABS tablet Take 1 tablet by mouth daily.     NOVOTWIST 32G X 5 MM MISC Use as directed with Victozia.  2   Semaglutide (OZEMPIC, 1 MG/DOSE, [place]) Inject 1 mg into the skin once a week.     spironolactone (ALDACTONE) 25 MG tablet Take 12.5 mg by mouth daily.      traMADol (ULTRAM) 50 MG tablet TAKE 1 TABLET (50 MG TOTAL) BY MOUTH EVERY 6 (SIX) HOURS AS NEEDED FOR MODERATE PAIN 180 tablet 0   Urine Glucose-Ketones Test (KETO-DIASTIX) STRP      No current facility-administered medications for this encounter.    Allergies:   Nitrofurantoin   Social History:  The patient  reports that she has never smoked. She has never used smokeless tobacco. She reports that she does not drink alcohol and does not use drugs.   Family History:  The patient's family history includes Atrial  fibrillation in her maternal grandmother and mother; Diabetes Mellitus II in her brother and father; Hypertension in her brother, father, maternal grandmother, and mother; Kidney disease in her father; Pulmonary fibrosis in her mother; Stroke in her maternal grandmother.   ROS:  Please see the history of present illness.   All other systems are personally reviewed and negative.   Exam:   BP 100/70   Pulse 66   Wt 77.7 kg (171 lb 6.4 oz)   SpO2 98%   BMI 28.52 kg/m  General: NAD Neck: No JVD, no thyromegaly or thyroid nodule.  Lungs: Clear to auscultation bilaterally with normal respiratory effort. CV: Nondisplaced PMI.  Heart regular S1/S2, no S3/S4, no murmur.  No peripheral edema.  No carotid bruit.  Normal pedal pulses.  Abdomen: Soft, nontender, no hepatosplenomegaly, no distention.  Skin: Intact without lesions or rashes.  Neurologic: Alert and oriented x 3.  Psych: Normal affect. Extremities: No clubbing or cyanosis.  HEENT: Normal.   Recent Labs: 04/25/2023: Hemoglobin 12.5; Platelets 307.0 05/07/2023: B Natriuretic Peptide 50.7; BUN 31; Creatinine, Ser 1.54; Potassium 4.9; Sodium 139  Personally reviewed   Wt Readings from Last 3 Encounters:  05/07/23 77.7 kg (171 lb 6.4 oz)  04/25/23 78 kg (172 lb)  10/18/22 77.7 kg (171 lb 6.4 oz)      ASSESSMENT AND PLAN:  1. Chronic systolic CHF: 09/15/2015 ECHO EF 20%. Had cMRI EF 20-25%, RV mod dilated.  cMRI (11/16)  showed an area of mid-wall septal late gadolinium enhancement. NICM perhaps related to HTN versus viral myocarditis. SPEP negative.  09/16/2015 RHC/LHC coronaries ok, low cardiac index 1.7.  Repeat echo in 5/17 and again in 12/17 showed some improvement, EF 35-40%.  CPX in 12/17 showed low normal peak VO2 with no clear cardiopulmonary limitation. Echo in 11/18 showed EF up to 50-55%, echo in 12/19 showed EF 45-50%, echo in 8/21 showed EF 50-55%.  Echo in 11/22 was stable with EF 50-55%.  NYHA class II symptoms chronically. She is not volume overloaded on exam but does not feel that she can decrease her Lasix dose (develops edema when this is attempted).    - Continue lasix 40 mg BID.  BMET/BNP today.  - Continue spironolactone 25 mg daily.   - Continue Coreg 25 mg BID - Continue Entresto 49/51 mg BID - Continue ivabradine 5 mg BID - EF out of range for ICD.   - I will arrange for echo.  - She will followup in 6 months with APP.  2. Fibromyalgia: Disabled since 1996. No change.  3. Diabetes: Off empagliflozin due to recurrent yeast infection.  4. OSA: Now using CPAP.  5. Hyperlipidemia: Continue atorvastatin.  Signed, Marca Ancona, MD  05/07/2023  [facility] [address] [facility] [place] [address] [phone] (office) [phone] (fax)

## 2023-05-08 ENCOUNTER — Telehealth (HOSPITAL_COMMUNITY): Payer: Self-pay

## 2023-05-08 ENCOUNTER — Encounter: Payer: Self-pay | Admitting: Family Medicine

## 2023-05-08 MED ORDER — FUROSEMIDE 40 MG PO TABS: ORAL_TABLET | ORAL | 3 refills | Status: DC

## 2023-05-08 NOTE — Telephone Encounter (Signed)
Patient advised and verbalized understanding. Med list updated to reflect changes.   Meds ordered this encounter  Medications   furosemide (LASIX) 40 MG tablet    Sig: Take 1 tablet (40 mg total) by mouth every morning AND 0.5 tablets (20 mg total) every evening.    Dispense:  105 tablet    Refill:  3    Please cancel all previous orders for current medication. Change in dosage or pill size.

## 2023-05-08 NOTE — Telephone Encounter (Signed)
-----   Message from Laurey Morale, MD sent at 05/07/2023  8:15 PM EDT ----- Would back off on Lasix a bit, take 40 qam/20 qpm.

## 2023-05-14 NOTE — Telephone Encounter (Signed)
Received a blank copy of forms again, called patient to discuss this, per message [phone] pt forms were ready and could be picked up, per conversation with pt 05/14/23 she did pick these up and mailed them Advised pt to follow up with their benefit /case worker and find out if there were any issues or if there was a delay in receiving them and then she will call or send mychart to let us know if we need to do anything

## 2023-05-15 ENCOUNTER — Ambulatory Visit (INDEPENDENT_AMBULATORY_CARE_PROVIDER_SITE_OTHER): Payer: Medicare HMO | Admitting: Family Medicine

## 2023-05-15 ENCOUNTER — Encounter: Payer: Self-pay | Admitting: Family Medicine

## 2023-05-15 VITALS — BP 122/70 | HR 75 | Temp 98.0°F | Ht 65.0 in | Wt 171.0 lb

## 2023-05-15 DIAGNOSIS — N3091 Cystitis, unspecified with hematuria: Secondary | ICD-10-CM

## 2023-05-15 DIAGNOSIS — R300 Dysuria: Secondary | ICD-10-CM

## 2023-05-15 LAB — POCT URINALYSIS DIPSTICK
Glucose, UA: NEGATIVE
Nitrite, UA: POSITIVE
Protein, UA: POSITIVE — AB
Spec Grav, UA: 1.015 (ref 1.010–1.025)
Urobilinogen, UA: NEGATIVE E.U./dL — AB
pH, UA: 6 (ref 5.0–8.0)

## 2023-05-15 MED ORDER — SULFAMETHOXAZOLE-TRIMETHOPRIM 800-160 MG PO TABS: 1.0000 | ORAL_TABLET | Freq: Two times a day (BID) | ORAL | 0 refills | Status: DC

## 2023-05-15 NOTE — Progress Notes (Signed)
Subjective:  Patient ID: Robin Owen, female    DOB: 1961-06-30  Age: 62 y.o. MRN: [id number]  CC:  Chief Complaint  Patient presents with   Hematuria    Pt notes this am went to urinate and had slight pain, blood with wiping, prone to UTI     HPI Robin Owen presents for   Hematuria: Noted this morning. Felt ok yesterday. Initial void this am with burning with urination, then blood noted on second time urinating. Some urgency. Blood in toilet, no clots.  No fever, new abd or back pain. No n/v.  No home treatments.  Last UTI last year.  No recent abx.  No hx of nephrolithiasis.   History Patient Active Problem List   Diagnosis Date Noted   Pyelonephritis 03/21/2016   Sepsis (HCC) 03/21/2016   Hyponatremia 03/21/2016   Type 2 diabetes mellitus without complication, without long-term current use of insulin (HCC) 03/21/2016   Normocytic anemia 03/21/2016   Chronic systolic heart failure (HCC) 10/02/2015   Nonischemic cardiomyopathy (HCC)    Pulmonary hypertension (HCC)    Hyperlipidemia 09/14/2015   Essential hypertension 09/14/2015   Fibromyalgia 09/14/2015   Hypothyroidism 09/14/2015   Past Medical History:  Diagnosis Date   Anemia    CHF (congestive heart failure) (HCC)    Depression    Diabetes mellitus without complication (HCC)    Fibromyalgia    Hyperlipidemia    Hypertension    Hypothyroidism    Past Surgical History:  Procedure Laterality Date   CARDIAC CATHETERIZATION N/A 09/16/2015   Procedure: Right/Left Heart Cath and Coronary Angiography;  Surgeon: Peter M Swaziland, MD;  Location: [facility];  Service: Cardiovascular;  Laterality: N/A;   ENDOMETRIAL ABLATION  2012   Allergies  Allergen Reactions   Nitrofurantoin Other (See Comments)    Fever, flu like symtoms   Prior to Admission medications   Medication Sig Start Date End Date Taking? Authorizing Provider  acetaminophen (TYLENOL) 325 MG tablet Take 650 mg by mouth every 6 (six)  hours as needed for mild pain.   Yes [provider]  aspirin 81 MG EC tablet Adult Low Dose Aspirin 81 mg tablet,delayed release  Take 1 tablet every day by oral route.   Yes [provider]  atorvastatin (LIPITOR) 20 MG tablet Take 20 mg by mouth daily. 01/08/20  Yes [provider]  carvedilol (COREG) 25 MG tablet Take 1 tablet by mouth 2 (two) times daily with a meal.   Yes [provider]  Cholecalciferol 25 MCG (1000 UT) tablet Take 1,000 Units by mouth daily.   Yes [provider]  Continuous Glucose Sensor (DEXCOM G7 SENSOR) MISC by Does not apply route.   Yes [provider]  CORLANOR 5 MG TABS tablet TAKE 1 TABLET BY MOUTH TWICE A DAY WITH MEALS 10/19/22  Yes Laurey Morale, MD  cyclobenzaprine (FLEXERIL) 10 MG tablet TAKE 1 TABLET (10 MG TOTAL) BY MOUTH IN THE MORNING AND AT BEDTIME 04/15/23  Yes Shade Flood, MD  ENTRESTO 49-51 MG TAKE 1 TABLET BY MOUTH TWICE A DAY 04/15/23  Yes Laurey Morale, MD  famotidine (PEPCID) 40 MG tablet Take 40 mg by mouth at bedtime. 05/10/20  Yes [provider]  FLUoxetine (PROZAC) 20 MG capsule 1 tablet daily 04/25/23  Yes Shade Flood, MD  furosemide (LASIX) 40 MG tablet Take 1 tablet (40 mg total) by mouth every morning AND 0.5 tablets (20 mg total) every evening.  05/08/23  Yes Laurey Morale, MD  gabapentin (NEURONTIN) 600 MG tablet Take 1 tablet (600 mg total) by mouth 2 (two) times daily. 04/25/23  Yes Shade Flood, MD  Insulin Degludec (TRESIBA [place]) Inject 20 Units into the skin daily.   Yes [provider]  Iron-FA-B Cmp-C-Biot-Probiotic (FUSION PLUS) CAPS Take 1 capsule by mouth daily. Take 1 capsule by mouth daily. 10/18/22  Yes Shade Flood, MD  levothyroxine (SYNTHROID) 112 MCG tablet Take 112 mcg by mouth daily before breakfast.   Yes [provider]  metFORMIN (GLUCOPHAGE) 1000 MG tablet Take 1 tablet (1,000 mg total) by mouth 2 (two) times daily  with a meal. 09/17/15  Yes Calvert Cantor, MD  Multiple Vitamin (MULTIVITAMIN WITH MINERALS) TABS tablet Take 1 tablet by mouth daily.   Yes [provider]  NOVOTWIST 32G X 5 MM MISC Use as directed with Victozia. 08/19/15  Yes [provider]  Semaglutide (OZEMPIC, 1 MG/DOSE, [place]) Inject 1 mg into the skin once a week.   Yes [provider]  spironolactone (ALDACTONE) 25 MG tablet Take 12.5 mg by mouth daily.   Yes [provider]  traMADol (ULTRAM) 50 MG tablet TAKE 1 TABLET (50 MG TOTAL) BY MOUTH EVERY 6 (SIX) HOURS AS NEEDED FOR MODERATE PAIN 04/15/23  Yes Shade Flood, MD  Urine Glucose-Ketones Test [facility]) STRP  09/01/17  Yes [provider]   Social History   Socioeconomic History   Marital status: Married    Spouse name: Not on file   Number of children: Not on file   Years of education: Not on file   Highest education level: Not on file  Occupational History   Not on file  Tobacco Use   Smoking status: Never   Smokeless tobacco: Never  Substance and Sexual Activity   Alcohol use: No   Drug use: No   Sexual activity: Yes    Birth control/protection: None  Other Topics Concern   Not on file  Social History Narrative   Not on file   Social Determinants of Health   Financial Resource Strain: Not on file  Food Insecurity: Not on file  Transportation Needs: Not on file  Physical Activity: Not on file  Stress: Not on file  Social Connections: Not on file  Intimate Partner Violence: Not on file    Review of Systems Per HPI  Objective:   Vitals:   05/15/23 1043  BP: 122/70  Pulse: 75  Temp: 98 F (36.7 C)  TempSrc: Temporal  SpO2: 98%  Weight: 171 lb (77.6 kg)  Height: 5\' 5"  (1.651 m)     Physical Exam Constitutional:      Appearance: Normal appearance. She is well-developed.  HENT:     Head: Normocephalic and atraumatic.  Pulmonary:     Effort: Pulmonary effort is normal.  Abdominal:      General: There is no distension.     Palpations: Abdomen is soft.     Tenderness: There is no abdominal tenderness. There is no guarding or rebound.  Skin:    General: Skin is warm.  Neurological:     Mental Status: She is alert and oriented to person, place, and time.  Psychiatric:        Behavior: Behavior normal.       Results for orders placed or performed in visit on 05/15/23  POCT Urinalysis Dipstick  Result Value Ref Range   Color, UA red    Clarity, UA clear  Glucose, UA Negative Negative   Bilirubin, UA 1+    Ketones, UA trace    Spec Grav, UA 1.015 1.010 - 1.025   Blood, UA Moderate    pH, UA 6.0 5.0 - 8.0   Protein, UA Positive (A) Negative   Urobilinogen, UA negative (A) 0.2 or 1.0 E.U./dL   Nitrite, UA Positive    Leukocytes, UA Large (3+) (A) Negative   Appearance     Odor      Assessment & Plan:  CHIANA WAMSER is a 62 y.o. female . Dysuria - Plan: POCT Urinalysis Dipstick, Urine Culture  Hemorrhagic cystitis - Plan: Urine Culture, sulfamethoxazole-trimethoprim (BACTRIM DS) 800-160 MG tablet  Suspected hemorrhagic cystitis, early symptoms.  Start Septra, 5-day course, check urine culture.  Decrease dose of metformin temporarily due to impact from Septra and return to normal dosing after completion of antibiotic.  1 month follow-up for recheck urine, RTC precautions if gross hematuria persists, or new/worsening symptoms. Meds ordered this encounter  Medications   sulfamethoxazole-trimethoprim (BACTRIM DS) 800-160 MG tablet    Sig: Take 1 tablet by mouth 2 (two) times daily.    Dispense:  10 tablet    Refill:  0   Patient Instructions  I think you have a urinary tract infection and that is likely the cause of the blood in the urine.  Start antibiotic twice per day for next 5 days.  Make sure to drink plenty of fluids.  Azo over-the-counter if needed.  Decrease metformin to 1/2 pill for now while taking Septra as the antibiotic can increase its dose.   Return to normal dosing after you have completed the antibiotic.  Follow-up with me in 1 month to recheck urine test to make sure the blood has cleared but if you notice any bleeding after completion of antibiotics to see me sooner.  I do not expect this to occur.  Take care!  Urinary Tract Infection, Adult  A urinary tract infection (UTI) is an infection of any part of the urinary tract. The urinary tract includes the kidneys, ureters, bladder, and urethra. These organs make, store, and get rid of urine in the body. An upper UTI affects the ureters and kidneys. A lower UTI affects the bladder and urethra. What are the causes? Most urinary tract infections are caused by bacteria in your genital area around your urethra, where urine leaves your body. These bacteria grow and cause inflammation of your urinary tract. What increases the risk? You are more likely to develop this condition if: You have a urinary catheter that stays in place. You are not able to control when you urinate or have a bowel movement (incontinence). You are female and you: Use a spermicide or diaphragm for birth control. Have low estrogen levels. Are pregnant. You have certain genes that increase your risk. You are sexually active. You take antibiotic medicines. You have a condition that causes your flow of urine to slow down, such as: An enlarged prostate, if you are female. Blockage in your urethra. A kidney stone. A nerve condition that affects your bladder control (neurogenic bladder). Not getting enough to drink, or not urinating often. You have certain medical conditions, such as: Diabetes. A weak disease-fighting system (immunesystem). Sickle cell disease. Gout. Spinal cord injury. What are the signs or symptoms? Symptoms of this condition include: Needing to urinate right away (urgency). Frequent urination. This may include small amounts of urine each time you urinate. Pain or burning with  urination. Blood in the  urine. Urine that smells bad or unusual. Trouble urinating. Cloudy urine. Vaginal discharge, if you are female. Pain in the abdomen or the lower back. You may also have: Vomiting or a decreased appetite. Confusion. Irritability or tiredness. A fever or chills. Diarrhea. The first symptom in older adults may be confusion. In some cases, they may not have any symptoms until the infection has worsened. How is this diagnosed? This condition is diagnosed based on your medical history and a physical exam. You may also have other tests, including: Urine tests. Blood tests. Tests for STIs (sexually transmitted infections). If you have had more than one UTI, a cystoscopy or imaging studies may be done to determine the cause of the infections. How is this treated? Treatment for this condition includes: Antibiotic medicine. Over-the-counter medicines to treat discomfort. Drinking enough water to stay hydrated. If you have frequent infections or have other conditions such as a kidney stone, you may need to see a health care provider who specializes in the urinary tract (urologist). In rare cases, urinary tract infections can cause sepsis. Sepsis is a life-threatening condition that occurs when the body responds to an infection. Sepsis is treated in the hospital with IV antibiotics, fluids, and other medicines. Follow these instructions at home:  Medicines Take over-the-counter and prescription medicines only as told by your health care provider. If you were prescribed an antibiotic medicine, take it as told by your health care provider. Do not stop using the antibiotic even if you start to feel better. General instructions Make sure you: Empty your bladder often and completely. Do not hold urine for long periods of time. Empty your bladder after sex. Wipe from front to back after urinating or having a bowel movement if you are female. Use each tissue only one time when  you wipe. Drink enough fluid to keep your urine pale yellow. Keep all follow-up visits. This is important. Contact a health care provider if: Your symptoms do not get better after 1-2 days. Your symptoms go away and then return. Get help right away if: You have severe pain in your back or your lower abdomen. You have a fever or chills. You have nausea or vomiting. Summary A urinary tract infection (UTI) is an infection of any part of the urinary tract, which includes the kidneys, ureters, bladder, and urethra. Most urinary tract infections are caused by bacteria in your genital area. Treatment for this condition often includes antibiotic medicines. If you were prescribed an antibiotic medicine, take it as told by your health care provider. Do not stop using the antibiotic even if you start to feel better. Keep all follow-up visits. This is important. This information is not intended to replace advice given to you by your health care provider. Make sure you discuss any questions you have with your health care provider. Document Revised: 05/22/2020 Document Reviewed: 05/27/2020 Elsevier Patient Education  2024 [employer] Inc.     Signed,   Meredith Staggers, MD [facility], [facility] 05/15/23 11:09 AM

## 2023-05-15 NOTE — Patient Instructions (Signed)
I think you have a urinary tract infection and that is likely the cause of the blood in the urine.  Start antibiotic twice per day for next 5 days.  Make sure to drink plenty of fluids.  Azo over-the-counter if needed.  Decrease metformin to 1/2 pill for now while taking Septra as the antibiotic can increase its dose.  Return to normal dosing after you have completed the antibiotic.  Follow-up with me in 1 month to recheck urine test to make sure the blood has cleared but if you notice any bleeding after completion of antibiotics to see me sooner.  I do not expect this to occur.  Take care!  Urinary Tract Infection, Adult  A urinary tract infection (UTI) is an infection of any part of the urinary tract. The urinary tract includes the kidneys, ureters, bladder, and urethra. These organs make, store, and get rid of urine in the body. An upper UTI affects the ureters and kidneys. A lower UTI affects the bladder and urethra. What are the causes? Most urinary tract infections are caused by bacteria in your genital area around your urethra, where urine leaves your body. These bacteria grow and cause inflammation of your urinary tract. What increases the risk? You are more likely to develop this condition if: You have a urinary catheter that stays in place. You are not able to control when you urinate or have a bowel movement (incontinence). You are female and you: Use a spermicide or diaphragm for birth control. Have low estrogen levels. Are pregnant. You have certain genes that increase your risk. You are sexually active. You take antibiotic medicines. You have a condition that causes your flow of urine to slow down, such as: An enlarged prostate, if you are female. Blockage in your urethra. A kidney stone. A nerve condition that affects your bladder control (neurogenic bladder). Not getting enough to drink, or not urinating often. You have certain medical conditions, such as: Diabetes. A weak  disease-fighting system (immunesystem). Sickle cell disease. Gout. Spinal cord injury. What are the signs or symptoms? Symptoms of this condition include: Needing to urinate right away (urgency). Frequent urination. This may include small amounts of urine each time you urinate. Pain or burning with urination. Blood in the urine. Urine that smells bad or unusual. Trouble urinating. Cloudy urine. Vaginal discharge, if you are female. Pain in the abdomen or the lower back. You may also have: Vomiting or a decreased appetite. Confusion. Irritability or tiredness. A fever or chills. Diarrhea. The first symptom in older adults may be confusion. In some cases, they may not have any symptoms until the infection has worsened. How is this diagnosed? This condition is diagnosed based on your medical history and a physical exam. You may also have other tests, including: Urine tests. Blood tests. Tests for STIs (sexually transmitted infections). If you have had more than one UTI, a cystoscopy or imaging studies may be done to determine the cause of the infections. How is this treated? Treatment for this condition includes: Antibiotic medicine. Over-the-counter medicines to treat discomfort. Drinking enough water to stay hydrated. If you have frequent infections or have other conditions such as a kidney stone, you may need to see a health care provider who specializes in the urinary tract (urologist). In rare cases, urinary tract infections can cause sepsis. Sepsis is a life-threatening condition that occurs when the body responds to an infection. Sepsis is treated in the hospital with IV antibiotics, fluids, and other medicines. Follow these  instructions at home:  Medicines Take over-the-counter and prescription medicines only as told by your health care provider. If you were prescribed an antibiotic medicine, take it as told by your health care provider. Do not stop using the antibiotic  even if you start to feel better. General instructions Make sure you: Empty your bladder often and completely. Do not hold urine for long periods of time. Empty your bladder after sex. Wipe from front to back after urinating or having a bowel movement if you are female. Use each tissue only one time when you wipe. Drink enough fluid to keep your urine pale yellow. Keep all follow-up visits. This is important. Contact a health care provider if: Your symptoms do not get better after 1-2 days. Your symptoms go away and then return. Get help right away if: You have severe pain in your back or your lower abdomen. You have a fever or chills. You have nausea or vomiting. Summary A urinary tract infection (UTI) is an infection of any part of the urinary tract, which includes the kidneys, ureters, bladder, and urethra. Most urinary tract infections are caused by bacteria in your genital area. Treatment for this condition often includes antibiotic medicines. If you were prescribed an antibiotic medicine, take it as told by your health care provider. Do not stop using the antibiotic even if you start to feel better. Keep all follow-up visits. This is important. This information is not intended to replace advice given to you by your health care provider. Make sure you discuss any questions you have with your health care provider. Document Revised: 05/22/2020 Document Reviewed: 05/27/2020 Elsevier Patient Education  2024 [employer].

## 2023-05-17 LAB — URINE CULTURE
MICRO NUMBER:: 15212690
SPECIMEN QUALITY:: ADEQUATE

## 2023-05-21 DIAGNOSIS — I43 Cardiomyopathy in diseases classified elsewhere: Secondary | ICD-10-CM | POA: Diagnosis not present

## 2023-05-21 DIAGNOSIS — E11649 Type 2 diabetes mellitus with hypoglycemia without coma: Secondary | ICD-10-CM | POA: Diagnosis not present

## 2023-05-21 DIAGNOSIS — E1169 Type 2 diabetes mellitus with other specified complication: Secondary | ICD-10-CM | POA: Diagnosis not present

## 2023-05-21 DIAGNOSIS — Z794 Long term (current) use of insulin: Secondary | ICD-10-CM | POA: Diagnosis not present

## 2023-05-21 DIAGNOSIS — E1165 Type 2 diabetes mellitus with hyperglycemia: Secondary | ICD-10-CM | POA: Diagnosis not present

## 2023-05-21 DIAGNOSIS — E785 Hyperlipidemia, unspecified: Secondary | ICD-10-CM | POA: Diagnosis not present

## 2023-05-21 DIAGNOSIS — E1122 Type 2 diabetes mellitus with diabetic chronic kidney disease: Secondary | ICD-10-CM | POA: Diagnosis not present

## 2023-05-21 DIAGNOSIS — E039 Hypothyroidism, unspecified: Secondary | ICD-10-CM | POA: Diagnosis not present

## 2023-05-21 DIAGNOSIS — I129 Hypertensive chronic kidney disease with stage 1 through stage 4 chronic kidney disease, or unspecified chronic kidney disease: Secondary | ICD-10-CM | POA: Diagnosis not present

## 2023-05-21 DIAGNOSIS — N1832 Chronic kidney disease, stage 3b: Secondary | ICD-10-CM | POA: Diagnosis not present

## 2023-05-22 DIAGNOSIS — N179 Acute kidney failure, unspecified: Secondary | ICD-10-CM | POA: Diagnosis not present

## 2023-05-22 DIAGNOSIS — E875 Hyperkalemia: Secondary | ICD-10-CM | POA: Diagnosis not present

## 2023-05-22 DIAGNOSIS — N1832 Chronic kidney disease, stage 3b: Secondary | ICD-10-CM | POA: Diagnosis not present

## 2023-05-28 ENCOUNTER — Telehealth (HOSPITAL_COMMUNITY): Payer: Self-pay

## 2023-05-28 DIAGNOSIS — I5022 Chronic systolic (congestive) heart failure: Secondary | ICD-10-CM

## 2023-05-28 DIAGNOSIS — E1169 Type 2 diabetes mellitus with other specified complication: Secondary | ICD-10-CM | POA: Diagnosis not present

## 2023-05-28 DIAGNOSIS — E875 Hyperkalemia: Secondary | ICD-10-CM | POA: Diagnosis not present

## 2023-05-28 DIAGNOSIS — I43 Cardiomyopathy in diseases classified elsewhere: Secondary | ICD-10-CM | POA: Diagnosis not present

## 2023-05-28 DIAGNOSIS — Z978 Presence of other specified devices: Secondary | ICD-10-CM | POA: Diagnosis not present

## 2023-05-28 DIAGNOSIS — E785 Hyperlipidemia, unspecified: Secondary | ICD-10-CM | POA: Diagnosis not present

## 2023-05-28 DIAGNOSIS — N179 Acute kidney failure, unspecified: Secondary | ICD-10-CM | POA: Diagnosis not present

## 2023-05-28 DIAGNOSIS — I129 Hypertensive chronic kidney disease with stage 1 through stage 4 chronic kidney disease, or unspecified chronic kidney disease: Secondary | ICD-10-CM | POA: Diagnosis not present

## 2023-05-28 DIAGNOSIS — E1165 Type 2 diabetes mellitus with hyperglycemia: Secondary | ICD-10-CM | POA: Diagnosis not present

## 2023-05-28 DIAGNOSIS — N1832 Chronic kidney disease, stage 3b: Secondary | ICD-10-CM | POA: Diagnosis not present

## 2023-05-28 DIAGNOSIS — E1122 Type 2 diabetes mellitus with diabetic chronic kidney disease: Secondary | ICD-10-CM | POA: Diagnosis not present

## 2023-05-28 DIAGNOSIS — Z794 Long term (current) use of insulin: Secondary | ICD-10-CM | POA: Diagnosis not present

## 2023-05-28 NOTE — Telephone Encounter (Signed)
Dr. [name] advised, he will call patient and let her know.

## 2023-05-28 NOTE — Telephone Encounter (Signed)
Dr. Diamantina Monks, endocrinologist called to report that on 7/23 patient had a K 6.3. he had her stop  Entresto and spiro. Today her K is back down to 4.1. He wants to know your thoughts about restarting her Entresto at a lower dose and having her possibly staying off of the Spironolactone.    CB# [phone]

## 2023-05-28 NOTE — Telephone Encounter (Signed)
Restart Entresto at 24/26 bid, BMET in 1 week.

## 2023-05-29 ENCOUNTER — Encounter (INDEPENDENT_AMBULATORY_CARE_PROVIDER_SITE_OTHER): Payer: Self-pay

## 2023-05-31 NOTE — Addendum Note (Signed)
Addended by: Bea Laura B on: 05/31/2023 09:09 AM   Modules accepted: Orders

## 2023-05-31 NOTE — Telephone Encounter (Signed)
She needs to followup with APP in clinic in the next couple of weeks to reassess.

## 2023-05-31 NOTE — Telephone Encounter (Signed)
Patient called back to clinic stating that she had not heard back regarding recommendations.   Advised patient to restart entresto at half dose- 24/26 twice daily and remain off spiro for now- repeat blood work ordered and scheduled for 1 week.  Patient also wanted to make Dr. Shirlee Latch aware that she has been  taking lasix (furosemide) 40mg  twice daily due to feeling like fluid was building up from being off entresto/spiro. Patient denies weight gain/shortness of breath  Advised patient I would make Dr. Shirlee Latch aware- advised her to weigh daily and monitor BP- let us know if concerns. BMET ordered and scheduled. Patient aware.

## 2023-06-03 ENCOUNTER — Other Ambulatory Visit: Payer: Self-pay | Admitting: Family Medicine

## 2023-06-03 DIAGNOSIS — M797 Fibromyalgia: Secondary | ICD-10-CM

## 2023-06-03 NOTE — Telephone Encounter (Signed)
Spoke with patient, follow up scheduled.

## 2023-06-04 NOTE — Telephone Encounter (Signed)
Medication discussed at June 27 physical.  Controlled substance database reviewed.  Gabapentin filled June 27, tramadol on June 17.  Refill ordered.

## 2023-06-04 NOTE — Telephone Encounter (Signed)
Tramadol 50 mg LOV: 04/25/23 Last Refill:04/15/23 Upcoming appt: 06/20/23

## 2023-06-06 ENCOUNTER — Ambulatory Visit (HOSPITAL_COMMUNITY)
Admission: RE | Admit: 2023-06-06 | Discharge: 2023-06-06 | Disposition: A | Discharge status: Home / Self Care | Payer: Medicare HMO | Source: Ambulatory Visit | Attending: Cardiology | Admitting: Cardiology

## 2023-06-06 DIAGNOSIS — I5022 Chronic systolic (congestive) heart failure: Secondary | ICD-10-CM | POA: Insufficient documentation

## 2023-06-06 LAB — BASIC METABOLIC PANEL
Anion gap: 10 (ref 5–15)
BUN: 16 mg/dL (ref 6–20)
CO2: 28 mmol/L (ref 22–32)
Calcium: 9 mg/dL (ref 8.9–10.3)
Chloride: 100 mmol/L (ref 98–111)
Creatinine, Ser: 1.23 mg/dL — ABNORMAL HIGH (ref 0.44–1.00)
GFR, Estimated: 51 mL/min — ABNORMAL LOW (ref >60)
Glucose, Bld: 196 mg/dL — ABNORMAL HIGH (ref 70–99)
Potassium: 4.1 mmol/L (ref 3.5–5.1)
Sodium: 138 mmol/L (ref 135–145)

## 2023-06-14 ENCOUNTER — Ambulatory Visit (HOSPITAL_COMMUNITY)
Admission: RE | Admit: 2023-06-14 | Discharge: 2023-06-14 | Disposition: A | Discharge status: Home / Self Care | Payer: Medicare HMO | Source: Ambulatory Visit

## 2023-06-14 DIAGNOSIS — E119 Type 2 diabetes mellitus without complications: Secondary | ICD-10-CM | POA: Insufficient documentation

## 2023-06-14 DIAGNOSIS — I5022 Chronic systolic (congestive) heart failure: Secondary | ICD-10-CM | POA: Insufficient documentation

## 2023-06-14 DIAGNOSIS — E785 Hyperlipidemia, unspecified: Secondary | ICD-10-CM | POA: Diagnosis not present

## 2023-06-14 DIAGNOSIS — I110 Hypertensive heart disease with heart failure: Secondary | ICD-10-CM | POA: Diagnosis not present

## 2023-06-14 LAB — ECHOCARDIOGRAM COMPLETE
AR max vel: 2.28 cm2
AV Area VTI: 2.82 cm2
AV Area mean vel: 2.36 cm2
AV Mean grad: 3 mmHg
AV Peak grad: 6 mmHg
Ao pk vel: 1.22 m/s
Area-P 1/2: 4.1 cm2
Calc EF: 56.3 %
S' Lateral: 3.8 cm
Single Plane A2C EF: 51.3 %
Single Plane A4C EF: 57.8 %

## 2023-06-20 ENCOUNTER — Encounter: Payer: Self-pay | Admitting: Family Medicine

## 2023-06-20 ENCOUNTER — Ambulatory Visit (INDEPENDENT_AMBULATORY_CARE_PROVIDER_SITE_OTHER): Payer: Medicare HMO | Admitting: Family Medicine

## 2023-06-20 VITALS — BP 122/72 | HR 67 | Temp 98.5°F | Ht 65.0 in | Wt 169.8 lb

## 2023-06-20 DIAGNOSIS — N3091 Cystitis, unspecified with hematuria: Secondary | ICD-10-CM

## 2023-06-20 LAB — POCT URINALYSIS DIP (MANUAL ENTRY)
Bilirubin, UA: NEGATIVE
Blood, UA: NEGATIVE
Glucose, UA: NEGATIVE mg/dL
Ketones, POC UA: NEGATIVE mg/dL
Nitrite, UA: NEGATIVE
Protein Ur, POC: NEGATIVE mg/dL
Spec Grav, UA: 1.01 (ref 1.010–1.025)
Urobilinogen, UA: 0.2 E.U./dL
pH, UA: 6.5 (ref 5.0–8.0)

## 2023-06-20 NOTE — Progress Notes (Signed)
Subjective:  Patient ID: Robin Owen, female    DOB: 02/27/1961  Age: 62 y.o. MRN: [id number]  CC:  Chief Complaint  Patient presents with   Follow-up    1 month hematuria; pt states she finished the abx and is no longer having prior sxs    HPI Robin Owen presents for   Hematuria: Treated for UTI on July 17, urinalysis with hematuria noted at that time.  Urine culture Klebsiella species, treated with Septra. No further dysuria. Completed abx.  No return of hematuria.   History Patient Active Problem List   Diagnosis Date Noted   Pyelonephritis 03/21/2016   Sepsis (HCC) 03/21/2016   Hyponatremia 03/21/2016   Type 2 diabetes mellitus without complication, without long-term current use of insulin (HCC) 03/21/2016   Normocytic anemia 03/21/2016   Chronic systolic heart failure (HCC) 10/02/2015   Nonischemic cardiomyopathy (HCC)    Pulmonary hypertension (HCC)    Hyperlipidemia 09/14/2015   Essential hypertension 09/14/2015   Fibromyalgia 09/14/2015   Hypothyroidism 09/14/2015   Past Medical History:  Diagnosis Date   Anemia    CHF (congestive heart failure) (HCC)    Depression    Diabetes mellitus without complication (HCC)    Fibromyalgia    Hyperlipidemia    Hypertension    Hypothyroidism    Past Surgical History:  Procedure Laterality Date   CARDIAC CATHETERIZATION N/A 09/16/2015   Procedure: Right/Left Heart Cath and Coronary Angiography;  Surgeon: Peter M Sw[place], MD;  Location: [facility];  Service: Cardiovascular;  Laterality: N/A;   ENDOMETRIAL ABLATION  2012   Allergies  Allergen Reactions   Nitrofurantoin Other (See Comments)    Fever, flu like symtoms   Prior to Admission medications   Medication Sig Start Date End Date Taking? Authorizing Provider  acetaminophen (TYLENOL) 325 MG tablet Take 650 mg by mouth every 6 (six) hours as needed for mild pain.   Yes [provider]  aspirin 81 MG EC tablet Adult Low Dose Aspirin 81 mg  tablet,delayed release  Take 1 tablet every day by oral route.   Yes [provider]  atorvastatin (LIPITOR) 20 MG tablet Take 20 mg by mouth daily. 01/08/20  Yes [provider]  carvedilol (COREG) 25 MG tablet Take 1 tablet by mouth 2 (two) times daily with a meal.   Yes [provider]  Cholecalciferol 25 MCG (1000 UT) tablet Take 1,000 Units by mouth daily.   Yes [provider]  Continuous Glucose Sensor (DEXCOM G7 SENSOR) MISC by Does not apply route.   Yes [provider]  CORLANOR 5 MG TABS tablet TAKE 1 TABLET BY MOUTH TWICE A DAY WITH MEALS 10/19/22  Yes Laurey Morale, MD  cyclobenzaprine (FLEXERIL) 10 MG tablet TAKE 1 TABLET (10 MG TOTAL) BY MOUTH IN THE MORNING AND AT BEDTIME 04/15/23  Yes Shade Flood, MD  ENTRESTO 49-51 MG TAKE 1 TABLET BY MOUTH TWICE A DAY 04/15/23  Yes Laurey Morale, MD  famotidine (PEPCID) 40 MG tablet Take 40 mg by mouth at bedtime. 05/10/20  Yes [provider]  FLUoxetine (PROZAC) 20 MG capsule 1 tablet daily 04/25/23  Yes Shade Flood, MD  furosemide (LASIX) 40 MG tablet Take 1 tablet (40 mg total) by mouth every morning AND 0.5 tablets (20 mg total) every evening. 05/08/23  Yes Laurey Morale, MD  gabapentin (NEURONTIN) 600 MG tablet Take 1 tablet (600 mg total) by mouth 2 (two) times daily. 04/25/23  Yes Shade Flood, MD  Insulin Degludec (TRESIBA [place]) Inject 20 Units into the skin daily.   Yes [provider]  Iron-FA-B Cmp-C-Biot-Probiotic (FUSION PLUS) CAPS Take 1 capsule by mouth daily. Take 1 capsule by mouth daily. 10/18/22  Yes Shade Flood, MD  levothyroxine (SYNTHROID) 112 MCG tablet Take 112 mcg by mouth daily before breakfast.   Yes [provider]  metFORMIN (GLUCOPHAGE) 1000 MG tablet Take 1 tablet (1,000 mg total) by mouth 2 (two) times daily with a meal. 09/17/15  Yes Calvert Cantor, MD  Multiple Vitamin (MULTIVITAMIN WITH MINERALS) TABS tablet Take 1  tablet by mouth daily.   Yes [provider]  NOVOTWIST 32G X 5 MM MISC Use as directed with Victozia. 08/19/15  Yes [provider]  Semaglutide (OZEMPIC, 1 MG/DOSE, [place]) Inject 1 mg into the skin once a week.   Yes [provider]  spironolactone (ALDACTONE) 25 MG tablet Take 12.5 mg by mouth daily.   Yes [provider]  sulfamethoxazole-trimethoprim (BACTRIM DS) 800-160 MG tablet Take 1 tablet by mouth 2 (two) times daily. 05/15/23  Yes Shade Flood, MD  traMADol (ULTRAM) 50 MG tablet TAKE 1 TABLET (50 MG TOTAL) BY MOUTH EVERY 6 (SIX) HOURS AS NEEDED FOR MODERATE PAIN 06/04/23  Yes Shade Flood, MD  Urine Glucose-Ketones Test [facility]) STRP  09/01/17  Yes [provider]   Social History   Socioeconomic History   Marital status: Married    Spouse name: Not on file   Number of children: Not on file   Years of education: Not on file   Highest education level: Not on file  Occupational History   Not on file  Tobacco Use   Smoking status: Never   Smokeless tobacco: Never  Substance and Sexual Activity   Alcohol use: No   Drug use: No   Sexual activity: Yes    Birth control/protection: None  Other Topics Concern   Not on file  Social History Narrative   Not on file   Social Determinants of Health   Financial Resource Strain: Not on file  Food Insecurity: Not on file  Transportation Needs: Not on file  Physical Activity: Not on file  Stress: Not on file  Social Connections: Not on file  Intimate Partner Violence: Not on file    Review of Systems  Per HPI Objective:   Vitals:   06/20/23 1052  BP: 122/72  Pulse: 67  Temp: 98.5 F (36.9 C)  SpO2: 97%  Weight: 169 lb 12.8 oz (77 kg)  Height: 5\' 5"  (1.651 m)     Physical Exam Vitals reviewed.  Constitutional:      Appearance: Normal appearance. She is well-developed.  HENT:     Head: Normocephalic and atraumatic.  Eyes:     Conjunctiva/sclera:  Conjunctivae normal.     Pupils: Pupils are equal, round, and reactive to light.  Neck:     Vascular: No carotid bruit.  Cardiovascular:     Rate and Rhythm: Normal rate and regular rhythm.     Heart sounds: Normal heart sounds.  Pulmonary:     Effort: Pulmonary effort is normal.     Breath sounds: Normal breath sounds.  Abdominal:     General: Abdomen is flat.     Palpations: Abdomen is soft. There is no pulsatile mass.     Tenderness: There is no abdominal tenderness. There is no right CVA tenderness or left CVA tenderness.  Musculoskeletal:  Right lower leg: No edema.     Left lower leg: No edema.  Skin:    General: Skin is warm and dry.  Neurological:     Mental Status: She is alert and oriented to person, place, and time.  Psychiatric:        Mood and Affect: Mood normal.        Behavior: Behavior normal.      Results for orders placed or performed in visit on 06/20/23  POCT urinalysis dipstick  Result Value Ref Range   Color, UA yellow yellow   Clarity, UA clear clear   Glucose, UA negative negative mg/dL   Bilirubin, UA negative negative   Ketones, POC UA negative negative mg/dL   Spec Grav, UA 4.034 7.425 - 1.025   Blood, UA negative negative   pH, UA 6.5 5.0 - 8.0   Protein Ur, POC negative negative mg/dL   Urobilinogen, UA 0.2 0.2 or 1.0 E.U./dL   Nitrite, UA Negative Negative   Leukocytes, UA Moderate (2+) (A) Negative     Assessment & Plan:  Robin Owen is a 62 y.o. female . Hemorrhagic cystitis - Plan: POCT urinalysis dipstick UTI symptoms have resolved and no gross hematuria.  Urinalysis performed as above, no hematuria but leukocytes noted.  Will send message to patient RTC if any return of UTI symptoms for repeat testing and treatment.  No orders of the defined types were placed in this encounter.  Patient Instructions  I will check urine test again today and if any persistent blood in the urine can discuss follow-up.  Glad to hear the  infection has improved.  Let me know if there are questions and take care    Signed,   Meredith Staggers, MD [facility], [facility] 06/20/23 11:31 AM

## 2023-06-20 NOTE — Patient Instructions (Signed)
I will check urine test again today and if any persistent blood in the urine can discuss follow-up.  Glad to hear the infection has improved.  Let me know if there are questions and take care

## 2023-06-23 ENCOUNTER — Other Ambulatory Visit: Payer: Self-pay | Admitting: Family Medicine

## 2023-06-23 DIAGNOSIS — M797 Fibromyalgia: Secondary | ICD-10-CM

## 2023-06-24 ENCOUNTER — Telehealth: Payer: Self-pay

## 2023-06-24 NOTE — Telephone Encounter (Signed)
Pt states she takes her Flexeril occasionally for muscle spasms and her Fibromyalgia  Flexeril 10 mg  Requested Prescriptions   Pending Prescriptions Disp Refills   cyclobenzaprine (FLEXERIL) 10 MG tablet [Pharmacy Med Name: CYCLOBENZAPRINE 10 MG TABLET] 60 tablet 1    Sig: TAKE 1 TABLET (10 MG TOTAL) BY MOUTH IN THE MORNING AND AT BEDTIME     Date of patient request: 06/24/23 Last office visit: 06/20/23 Date of last refill: 04/15/23 Last refill amount: 60 Follow up time period per chart: PRN

## 2023-06-24 NOTE — Telephone Encounter (Signed)
-----   Message from Shade Flood sent at 06/22/2023  8:09 AM EDT ----- Results sent by MyChart, but please make sure she received the results.

## 2023-06-27 ENCOUNTER — Encounter (HOSPITAL_COMMUNITY): Payer: Self-pay

## 2023-06-27 ENCOUNTER — Ambulatory Visit (HOSPITAL_COMMUNITY)
Admission: RE | Admit: 2023-06-27 | Discharge: 2023-06-27 | Disposition: A | Discharge status: Home / Self Care | Payer: Medicare HMO | Source: Ambulatory Visit | Attending: Cardiology | Admitting: Cardiology

## 2023-06-27 VITALS — BP 124/76 | HR 69 | Wt 171.0 lb

## 2023-06-27 DIAGNOSIS — E785 Hyperlipidemia, unspecified: Secondary | ICD-10-CM | POA: Insufficient documentation

## 2023-06-27 DIAGNOSIS — G4733 Obstructive sleep apnea (adult) (pediatric): Secondary | ICD-10-CM | POA: Insufficient documentation

## 2023-06-27 DIAGNOSIS — I428 Other cardiomyopathies: Secondary | ICD-10-CM | POA: Insufficient documentation

## 2023-06-27 DIAGNOSIS — E119 Type 2 diabetes mellitus without complications: Secondary | ICD-10-CM | POA: Insufficient documentation

## 2023-06-27 DIAGNOSIS — I5022 Chronic systolic (congestive) heart failure: Secondary | ICD-10-CM | POA: Diagnosis not present

## 2023-06-27 DIAGNOSIS — M797 Fibromyalgia: Secondary | ICD-10-CM | POA: Diagnosis not present

## 2023-06-27 DIAGNOSIS — I5032 Chronic diastolic (congestive) heart failure: Secondary | ICD-10-CM | POA: Diagnosis not present

## 2023-06-27 DIAGNOSIS — R0602 Shortness of breath: Secondary | ICD-10-CM | POA: Diagnosis not present

## 2023-06-27 DIAGNOSIS — N179 Acute kidney failure, unspecified: Secondary | ICD-10-CM | POA: Diagnosis not present

## 2023-06-27 LAB — BASIC METABOLIC PANEL
Anion gap: 10 (ref 5–15)
BUN: 15 mg/dL (ref 6–20)
CO2: 29 mmol/L (ref 22–32)
Calcium: 9.2 mg/dL (ref 8.9–10.3)
Chloride: 99 mmol/L (ref 98–111)
Creatinine, Ser: 1.15 mg/dL — ABNORMAL HIGH (ref 0.44–1.00)
GFR, Estimated: 55 mL/min — ABNORMAL LOW (ref >60)
Glucose, Bld: 154 mg/dL — ABNORMAL HIGH (ref 70–99)
Potassium: 4.5 mmol/L (ref 3.5–5.1)
Sodium: 138 mmol/L (ref 135–145)

## 2023-06-27 LAB — BRAIN NATRIURETIC PEPTIDE: B Natriuretic Peptide: 53.4 pg/mL (ref 0.0–100.0)

## 2023-06-27 NOTE — Progress Notes (Signed)
Date:  06/27/2023   ID:  Robin Owen, DOB November 06, 1960, MRN [id number]   Provider location: [facility] Type of Visit: Established patient  PCP:  Shade Flood, MD  Cardiologist:  Dr. Shirlee Latch  Chief Complaint: Shortness of breath   History of Present Illness: Robin Owen is a 62 y.o. female who has a past medical history of HTN, HLD, DMII, hypothyroidism and fibromyalgia (on disability), and CHF.    Admitted 11/16 through 09/17/15 with increased dyspnea. CXR with pulmonary edema. This prompted and ECHO that showed reduced EF 20%. RHC/LHC showed normal coronaries and reduced cardiac index. Cardiac MRI showed some mid-wall late gadolinium enhancement in the septum.  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 Started on coreg and lasix. Continued on lisinopril. Discharge weight was 163 pounds.    She developed AKI and hyperkalemia in the setting of high dose Ibuprofen.  She was instructed to stop this and she was started on tramadol as needed instead.     Echo in 11/18 showed increase in EF to 50-55%.       Echo 10/24/18 LVEF 45-50%, Grade 1 DD, Mild MR, PA peak pressure 24 mm Hg. Echo in 8/21 showed EF 50-55%, basal inferior hypokinesis, normal RV.  Echo in 11/22 showed EF 50-55%, normal RV, normal IVC.   Echo 8/24 EF 55-60%. RV normal.    She presents to clinic today for assistance w/ meds and assessment of volume status. She was seen recently by her endocrinologist and labs at that office showed hyperkalemia, K 6.3. This was in setting of AKI, SCr had risen to 2.28. She was instructed to stop both Entresto and spiro. She had repeat labs shortly after and hyperkalemia had resolved, K down to 4.1. SCr improved to 1.19. Then instructed to restart Entresto at reduced dose, 24-26 mg bid (previously on 49-51 dose) and to stay off spiro. F/u labs 1 wk later showed stable K, 4.1. SCr 1.23. As a result of medication hold, pt called to report subsequent weight gain and edema. Here today  for evaluation.   Today, she reports improvement. She self increased lasix back to 40 mg bid (had been reduced to 40 qam/ 20 mg qpm previous visit). Edema now resolved. Denies resting dyspnea. No orthopnea/PND. No exertional dyspnea w/ ADLs. No CP. BP stable 124/76. No dizziness. She thinks her AKI was due to multiple factors. Reports having a UTI around that time and was also taking metformin, which her endocrinologist suspected was contributing in addition of her other HF meds.     ECG: not performed   Test/Procedures  09/15/2015 ECHO EF 20% MV mild regurgitation TV mild regurgitation   09/19/2015 CMRI 1. Severely dilated left ventricle with normal wall thickness and severe systolic dysfunction (LVEF 20-25%) with global hypokinesis and akinesis of the entire septal wall and diffuse mid wall late gadolinium enhancement.  2. Moderate RV dilatation with mild  moderate systolic dysfunction.  3. Moderate left and right atrial dilatation.  4. Mild to moderate mitral and mild tricuspid regurgitation.  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   RHC/LHC 09/16/2015  RA 7 PCWP 29 CO 3.11 CI 1.7  Normal Cors   Echo (5/17) with EF 35-40%.    Echo (12/17) with EF 35-40%, mild LV dilation.    CPX (12/17) with RER 1.13, peak VO2 20.8, VE/VCO2 slope 30.   Echo (11/18): EF 50-55%.   Echo (12/19): LVEF 45-50%, Grade 1 DD, Mild  MR, PA peak pressure 24 mm Hg  Echo (8/21): EF 50-55%, basal inferior hypokinesis, normal RV size and systolic function.   Echo (11/22): EF 50-55%, normal RV, normal IVC Echo (8/23): EF 55-60%, RV normal    Labs 09/15/2015: BNP 2354 Labs 09/16/2015: TSH 1.99 Labs 09/17/2015: K 3.8 Creatinine 0.91 Labs 09/29/2015: K 4.5 Creatinine 1.09 Labs 10/05/2015: K 5.3 Creatinine 1.21, LDL 74, HCT 37.2 Labs 1/17: SPEP negative Labs 2/17: K 3.9, creatinine 1.34, digoxin 0.7, hemoglobin 12.5 Labs 3/17: K  3.9, creatinine 1.25, hemoglobin 10 Labs 4/17: K 4.2, creatinine 1.15 Labs 5/17: K 3.8, creatinine 1.11, HCT 31.7 Labs 6/17: K 5.1, creatinine 0.98, BNP 45 Labs 12/17: K 5, creatinine 1.17, BNP 35 Labs 3/18: K 3.9, creatinine 1.07 Labs 8/18: K 4.3, creatinine 0.94 Labs 2/19: K 5.7, creatinine 1.39, Na 125, hgb 12.6, LDL 102 Labs 5/19: K 4.7, creatinine 1.12 Labs 12/19: K 4.1, creatinine 1.09 Labs 5/20: creatinine 1.13 Labs 7/20: LDL 83, HDL 53, LFTs normal Labs 9/20: K 4.6, creatinine 1.2 Labs 2/21: K 4.6, creatinine 1.35 Labs 3/22: K 4.7, creatinine 1.18, LDL 88, HDL 45 Labs 9/22: LDL 75, K 5, creatinine 1.28 Labs 1/23: LDL 74, K 5.3, creatinine 1.4 Labs 12/23: K 4.2, creatinine 1.26 Labs (05/21/23): K 6.3, creatinine 2.28 Labs (05/28/23): K 4.1, creatinine 1.19 Labs (06/06/23): K 4.1, creatinine 1.23    Past Medical History:  Diagnosis Date   Anemia    CHF (congestive heart failure) (HCC)    Depression    Diabetes mellitus without complication (HCC)    Fibromyalgia    Hyperlipidemia    Hypertension    Hypothyroidism      Current Outpatient Medications  Medication Sig Dispense Refill   acetaminophen (TYLENOL) 325 MG tablet Take 650 mg by mouth every 6 (six) hours as needed for mild pain.     aspirin 81 MG EC tablet Adult Low Dose Aspirin 81 mg tablet,delayed release  Take 1 tablet every day by oral route.     atorvastatin (LIPITOR) 20 MG tablet Take 20 mg by mouth daily.     carvedilol (COREG) 25 MG tablet Take 1 tablet by mouth 2 (two) times daily with a meal.     Cholecalciferol 25 MCG (1000 UT) tablet Take 1,000 Units by mouth daily.     Continuous Glucose Sensor (DEXCOM G7 SENSOR) MISC by Does not apply route.     CORLANOR 5 MG TABS tablet TAKE 1 TABLET BY MOUTH TWICE A DAY WITH MEALS 180 tablet 3   cyclobenzaprine (FLEXERIL) 10 MG tablet TAKE 1 TABLET (10 MG TOTAL) BY MOUTH IN THE MORNING AND AT BEDTIME 60 tablet 1   ENTRESTO 49-51 MG TAKE 1 TABLET BY MOUTH TWICE A  DAY (Patient taking differently: Take 0.5 tablets by mouth 2 (two) times daily.) 180 tablet 3   famotidine (PEPCID) 40 MG tablet Take 40 mg by mouth at bedtime.     FLUoxetine (PROZAC) 20 MG capsule 1 tablet daily 90 capsule 1   furosemide (LASIX) 40 MG tablet Take 40 mg by mouth 2 (two) times daily.     gabapentin (NEURONTIN) 600 MG tablet Take 1 tablet (600 mg total) by mouth 2 (two) times daily. 180 tablet 1   Insulin Degludec (TRESIBA [place]) Inject 20 Units into the skin daily.     Iron-FA-B Cmp-C-Biot-Probiotic (FUSION PLUS) CAPS Take 1 capsule by mouth daily. Take 1 capsule by mouth daily. 90 capsule 3   levothyroxine (SYNTHROID) 112 MCG tablet Take 112 mcg  by mouth daily before breakfast.     Multiple Vitamin (MULTIVITAMIN WITH MINERALS) TABS tablet Take 1 tablet by mouth daily.     NOVOTWIST 32G X 5 MM MISC Use as directed with Victozia.  2   Semaglutide (OZEMPIC, 1 MG/DOSE, [place]) Inject 1 mg into the skin once a week.     traMADol (ULTRAM) 50 MG tablet TAKE 1 TABLET (50 MG TOTAL) BY MOUTH EVERY 6 (SIX) HOURS AS NEEDED FOR MODERATE PAIN 180 tablet 0   Urine Glucose-Ketones Test (KETO-DIASTIX) STRP      No current facility-administered medications for this encounter.    Allergies:   Nitrofurantoin   Social History:  The patient  reports that she has never smoked. She has never used smokeless tobacco. She reports that she does not drink alcohol and does not use drugs.   Family History:  The patient's family history includes Atrial fibrillation in her maternal grandmother and mother; Diabetes Mellitus II in her brother and father; Hypertension in her brother, father, maternal grandmother, and mother; Kidney disease in her father; Pulmonary fibrosis in her mother; Stroke in her maternal grandmother.   ROS:  Please see the history of present illness.   All other systems are personally reviewed and negative.   Exam:   BP 124/76   Pulse 69   Wt 77.6 kg (171 lb)   SpO2 97%   BMI 28.46 kg/m   General:  Well appearing. No respiratory difficulty HEENT: normal Neck: supple. no JVD. Carotids 2+ bilat; no bruits. No lymphadenopathy or thyromegaly appreciated. Cor: PMI nondisplaced. Regular rate & rhythm. No rubs, gallops or murmurs. Lungs: clear Abdomen: soft, nontender, nondistended. No hepatosplenomegaly. No bruits or masses. Good bowel sounds. Extremities: no cyanosis, clubbing, rash, edema Neuro: alert & oriented x 3, cranial nerves grossly intact. moves all 4 extremities w/o difficulty. Affect pleasant.   Recent Labs: 04/25/2023: Hemoglobin 12.5; Platelets 307.0 05/07/2023: B Natriuretic Peptide 50.7 06/06/2023: BUN 16; Creatinine, Ser 1.23; Potassium 4.1; Sodium 138  Personally reviewed   Wt Readings from Last 3 Encounters:  06/27/23 77.6 kg (171 lb)  06/20/23 77 kg (169 lb 12.8 oz)  05/15/23 77.6 kg (171 lb)      ASSESSMENT AND PLAN:  1. Chronic systolic CHF: 09/15/2015 ECHO EF 20%. Had cMRI EF 20-25%, RV mod dilated.  cMRI (11/16) showed an area of mid-wall septal late gadolinium enhancement. NICM perhaps related to HTN versus viral myocarditis. SPEP negative.  09/16/2015 RHC/LHC coronaries ok, low cardiac index 1.7.  Repeat echo in 5/17 and again in 12/17 showed some improvement, EF 35-40%.  CPX in 12/17 showed low normal peak VO2 with no clear cardiopulmonary limitation. Echo in 11/18 showed EF up to 50-55%, echo in 12/19 showed EF 45-50%, echo in 8/21 showed EF 50-55%.  Echo in 11/22 was stable with EF 50-55%. Echo 8/23 EF 55-60%, RV normal. Stable NYHA class II symptoms. Euvolemic on exam  - Continue lasix 40 mg BID.   - Continue Coreg 25 mg BID - Continue Entresto 24-26 mg BID - Continue ivabradine 5 mg BID - off Jardiance due to recurrent yeast infections and UTIs  - Check BMP today. If K/SCr stable, will add back spiro 12.5 mg daily w/ f/u BMP in 7 days  2. Recent Hyperkalemia/AKI - Labs 05/21/23, K 6.3, SCr 2.28 (b/l ~1.1). Entresto + spiro held. F/u labs 05/28/23 K  normalized to 4.1, SCr down to 1.19. Entresto restarted. F/u labs 8/8 w/ stable K and SCr, 4.1 and 1.23 respectively.  -  suspect AKI was multifactorial.  Reports having a UTI around that time and was also taking metformin, which her endocrinologist suspected was contributing in addition of her other HF meds.  - recheck BMP today. If K/SCr stable, will add back spiro 12.5 mg daily w/ f/u BMP in 7 days 2. Fibromyalgia: Disabled since 1996. No change.  3. Diabetes: Off empagliflozin due to recurrent yeast infection.  4. OSA: continue CPAP nightly.  5. Hyperlipidemia: recent Lipid panel 7/24, LDL 79 mg/dL. Followed by PCP   - Continue atorvastatin.  F/u in 6 months w/ APP    Signed, Robbie Lis, PA-C  06/27/2023  [facility] [address] Heart and Vascular [place] [address] [phone] (office) [phone] (fax)

## 2023-06-27 NOTE — Patient Instructions (Addendum)
Labs done today. We will contact you only if your labs are abnormal.  Based on your results we will let you know if you need to restart the Spironolactone.   No other medication changes were made. Please continue all current medications as prescribed.  Your physician recommends that you schedule a follow-up appointment in: January 2025. Please contact our office in November to schedule an appointment.   If you have any questions or concerns before your next appointment please send [place] a message through [place] or call our office at [phone].    TO LEAVE A MESSAGE FOR THE NURSE SELECT OPTION 2, PLEASE LEAVE A MESSAGE INCLUDING: YOUR NAME DATE OF BIRTH CALL BACK NUMBER REASON FOR CALL**this is important as we prioritize the call backs  YOU WILL RECEIVE A CALL BACK THE SAME DAY AS LONG AS YOU CALL BEFORE 4:00 PM   Do the following things EVERYDAY: Weigh yourself in the morning before breakfast. Write it down and keep it in a log. Take your medicines as prescribed Eat low salt foods--Limit salt (sodium) to 2000 mg per day.  Stay as active as you can everyday Limit all fluids for the day to less than 2 liters   At the [facility], you and your health needs are our priority. As part of our continuing mission to provide you with exceptional heart care, we have created designated Provider Care Teams. These Care Teams include your primary Cardiologist (physician) and Advanced Practice Providers (APPs- Physician Assistants and Nurse Practitioners) who all work together to provide you with the care you need, when you need it.   You may see any of the following providers on your designated Care Team at your next follow up: Dr Arvilla Meres Dr Marca Ancona Dr. Marcos Eke, NP Robbie Lis, [place] [facility] [place], [place] Brynda Peon, NP Karle Plumber, PharmD   Please be sure to bring in all your medications bottles to every appointment.     Thank you for choosing [facility]

## 2023-06-28 ENCOUNTER — Telehealth (HOSPITAL_COMMUNITY): Payer: Self-pay

## 2023-06-28 DIAGNOSIS — I5022 Chronic systolic (congestive) heart failure: Secondary | ICD-10-CM

## 2023-06-28 MED ORDER — SPIRONOLACTONE 25 MG PO TABS
12.5000 mg | ORAL_TABLET | Freq: Every day | ORAL | 11 refills | Status: DC
Start: 2023-06-28 — End: 2024-01-22

## 2023-06-28 NOTE — Telephone Encounter (Signed)
-----   Message from [place] sent at 06/27/2023  2:24 PM EDT ----- SCr stable. K ok. Restart Spironolactone 12.5 mg daily and repeat BMP early next week ~Tuesday.

## 2023-06-28 NOTE — Telephone Encounter (Signed)
Patient's med list has been updated to reflect this change. Patient's labs has been placed and her appointment has been scheduled. In addition, Pt aware, agreeable, and verbalized understanding.

## 2023-07-02 ENCOUNTER — Ambulatory Visit (HOSPITAL_COMMUNITY)
Admission: RE | Admit: 2023-07-02 | Discharge: 2023-07-02 | Disposition: A | Discharge status: Home / Self Care | Payer: Medicare HMO | Source: Ambulatory Visit | Attending: Cardiology | Admitting: Cardiology

## 2023-07-02 DIAGNOSIS — I5022 Chronic systolic (congestive) heart failure: Secondary | ICD-10-CM | POA: Insufficient documentation

## 2023-07-02 LAB — BASIC METABOLIC PANEL
Anion gap: 11 (ref 5–15)
BUN: 20 mg/dL (ref 6–20)
CO2: 29 mmol/L (ref 22–32)
Calcium: 9.5 mg/dL (ref 8.9–10.3)
Chloride: 95 mmol/L — ABNORMAL LOW (ref 98–111)
Creatinine, Ser: 1.17 mg/dL — ABNORMAL HIGH (ref 0.44–1.00)
GFR, Estimated: 54 mL/min — ABNORMAL LOW (ref >60)
Glucose, Bld: 179 mg/dL — ABNORMAL HIGH (ref 70–99)
Potassium: 4.5 mmol/L (ref 3.5–5.1)
Sodium: 135 mmol/L (ref 135–145)

## 2023-07-24 ENCOUNTER — Ambulatory Visit (HOSPITAL_COMMUNITY)
Admission: RE | Admit: 2023-07-24 | Discharge: 2023-07-24 | Disposition: A | Discharge status: Home / Self Care | Payer: Medicare HMO | Source: Ambulatory Visit | Attending: Cardiology | Admitting: Cardiology

## 2023-07-24 DIAGNOSIS — I5022 Chronic systolic (congestive) heart failure: Secondary | ICD-10-CM | POA: Insufficient documentation

## 2023-07-24 LAB — BASIC METABOLIC PANEL
Anion gap: 10 (ref 5–15)
BUN: 21 mg/dL — ABNORMAL HIGH (ref 6–20)
CO2: 29 mmol/L (ref 22–32)
Calcium: 8.9 mg/dL (ref 8.9–10.3)
Chloride: 95 mmol/L — ABNORMAL LOW (ref 98–111)
Creatinine, Ser: 1.46 mg/dL — ABNORMAL HIGH (ref 0.44–1.00)
GFR, Estimated: 41 mL/min — ABNORMAL LOW (ref >60)
Glucose, Bld: 233 mg/dL — ABNORMAL HIGH (ref 70–99)
Potassium: 4.8 mmol/L (ref 3.5–5.1)
Sodium: 134 mmol/L — ABNORMAL LOW (ref 135–145)

## 2023-08-14 ENCOUNTER — Ambulatory Visit: Payer: Medicare HMO

## 2023-08-14 DIAGNOSIS — Z23 Encounter for immunization: Secondary | ICD-10-CM

## 2023-08-14 DIAGNOSIS — Z0000 Encounter for general adult medical examination without abnormal findings: Secondary | ICD-10-CM | POA: Diagnosis not present

## 2023-08-14 NOTE — Patient Instructions (Signed)
Robin Owen , Thank you for taking time to come for your Medicare Wellness Visit. I appreciate your ongoing commitment to your health goals. Please review the following plan we discussed and let me know if I can assist you in the future.   Screening recommendations/referrals: Colonoscopy: up to date Mammogram: Education provide Recommended yearly ophthalmology/optometry visit for glaucoma screening and checkup Recommended yearly dental visit for hygiene and checkup  Vaccinations: Influenza vaccine: up to date Pneumococcal vaccine: up to date Tdap vaccine: up to date Shingles vaccine: up to date      Preventive Care 65 Years and Older, Female Preventive care refers to lifestyle choices and visits with your health care provider that can promote health and wellness. What does preventive care include? A yearly physical exam. This is also called an annual well check. Dental exams once or twice a year. Routine eye exams. Ask your health care provider how often you should have your eyes checked. Personal lifestyle choices, including: Daily care of your teeth and gums. Regular physical activity. Eating a healthy diet. Avoiding tobacco and drug use. Limiting alcohol use. Practicing safe sex. Taking low-dose aspirin every day. Taking vitamin and mineral supplements as recommended by your health care provider. What happens during an annual well check? The services and screenings done by your health care provider during your annual well check will depend on your age, overall health, lifestyle risk factors, and family history of disease. Counseling  Your health care provider may ask you questions about your: Alcohol use. Tobacco use. Drug use. Emotional well-being. Home and relationship well-being. Sexual activity. Eating habits. History of falls. Memory and ability to understand (cognition). Work and work Astronomer. Reproductive health. Screening  You may have the following tests  or measurements: Height, weight, and BMI. Blood pressure. Lipid and cholesterol levels. These may be checked every 5 years, or more frequently if you are over 63 years old. Skin check. Lung cancer screening. You may have this screening every year starting at age 22 if you have a 30-pack-year history of smoking and currently smoke or have quit within the past 15 years. Fecal occult blood test (FOBT) of the stool. You may have this test every year starting at age 62. Flexible sigmoidoscopy or colonoscopy. You may have a sigmoidoscopy every 5 years or a colonoscopy every 10 years starting at age 40. Hepatitis C blood test. Hepatitis B blood test. Sexually transmitted disease (STD) testing. Diabetes screening. This is done by checking your blood sugar (glucose) after you have not eaten for a while (fasting). You may have this done every 1-3 years. Bone density scan. This is done to screen for osteoporosis. You may have this done starting at age 27. Mammogram. This may be done every 1-2 years. Talk to your health care provider about how often you should have regular mammograms. Talk with your health care provider about your test results, treatment options, and if necessary, the need for more tests. Vaccines  Your health care provider may recommend certain vaccines, such as: Influenza vaccine. This is recommended every year. Tetanus, diphtheria, and acellular pertussis (Tdap, Td) vaccine. You may need a Td booster every 10 years. Zoster vaccine. You may need this after age 18. Pneumococcal 13-valent conjugate (PCV13) vaccine. One dose is recommended after age 51. Pneumococcal polysaccharide (PPSV23) vaccine. One dose is recommended after age 85. Talk to your health care provider about which screenings and vaccines you need and how often you need them. This information is not intended to replace  advice given to you by your health care provider. Make sure you discuss any questions you have with your  health care provider. Document Released: 11/11/2015 Document Revised: 07/04/2016 Document Reviewed: 08/16/2015 Elsevier Interactive Patient Education  2017 [employer].  Fall Prevention in the Home Falls can cause injuries. They can happen to people of all ages. There are many things you can do to make your home safe and to help prevent falls. What can I do on the outside of my home? Regularly fix the edges of walkways and driveways and fix any cracks. Remove anything that might make you trip as you walk through a door, such as a raised step or threshold. Trim any bushes or trees on the path to your home. Use bright outdoor lighting. Clear any walking paths of anything that might make someone trip, such as rocks or tools. Regularly check to see if handrails are loose or broken. Make sure that both sides of any steps have handrails. Any raised decks and porches should have guardrails on the edges. Have any leaves, snow, or ice cleared regularly. Use sand or salt on walking paths during winter. Clean up any spills in your garage right away. This includes oil or grease spills. What can I do in the bathroom? Use night lights. Install grab bars by the toilet and in the tub and shower. Do not use towel bars as grab bars. Use non-skid mats or decals in the tub or shower. If you need to sit down in the shower, use a plastic, non-slip stool. Keep the floor dry. Clean up any water that spills on the floor as soon as it happens. Remove soap buildup in the tub or shower regularly. Attach bath mats securely with double-sided non-slip rug tape. Do not have throw rugs and other things on the floor that can make you trip. What can I do in the bedroom? Use night lights. Make sure that you have a light by your bed that is easy to reach. Do not use any sheets or blankets that are too big for your bed. They should not hang down onto the floor. Have a firm chair that has side arms. You can use this for  support while you get dressed. Do not have throw rugs and other things on the floor that can make you trip. What can I do in the kitchen? Clean up any spills right away. Avoid walking on wet floors. Keep items that you use a lot in easy-to-reach places. If you need to reach something above you, use a strong step stool that has a grab bar. Keep electrical cords out of the way. Do not use floor polish or wax that makes floors slippery. If you must use wax, use non-skid floor wax. Do not have throw rugs and other things on the floor that can make you trip. What can I do with my stairs? Do not leave any items on the stairs. Make sure that there are handrails on both sides of the stairs and use them. Fix handrails that are broken or loose. Make sure that handrails are as long as the stairways. Check any carpeting to make sure that it is firmly attached to the stairs. Fix any carpet that is loose or worn. Avoid having throw rugs at the top or bottom of the stairs. If you do have throw rugs, attach them to the floor with carpet tape. Make sure that you have a light switch at the top of the stairs and the bottom  of the stairs. If you do not have them, ask someone to add them for you. What else can I do to help prevent falls? Wear shoes that: Do not have high heels. Have rubber bottoms. Are comfortable and fit you well. Are closed at the toe. Do not wear sandals. If you use a stepladder: Make sure that it is fully opened. Do not climb a closed stepladder. Make sure that both sides of the stepladder are locked into place. Ask someone to hold it for you, if possible. Clearly mark and make sure that you can see: Any grab bars or handrails. First and last steps. Where the edge of each step is. Use tools that help you move around (mobility aids) if they are needed. These include: Canes. Walkers. Scooters. Crutches. Turn on the lights when you go into a dark area. Replace any light bulbs as soon  as they burn out. Set up your furniture so you have a clear path. Avoid moving your furniture around. If any of your floors are uneven, fix them. If there are any pets around you, be aware of where they are. Review your medicines with your doctor. Some medicines can make you feel dizzy. This can increase your chance of falling. Ask your doctor what other things that you can do to help prevent falls. This information is not intended to replace advice given to you by your health care provider. Make sure you discuss any questions you have with your health care provider. Document Released: 08/11/2009 Document Revised: 03/22/2016 Document Reviewed: 11/19/2014 Elsevier Interactive Patient Education  2017 [employer].

## 2023-08-14 NOTE — Progress Notes (Signed)
Subjective:   Robin Owen is a 62 y.o. female who presents for Medicare Annual (Subsequent) preventive examination.  Visit Complete: Virtual I connected with  [name] on 08/14/23 by a audio enabled telemedicine application and verified that I am speaking with the correct person using two identifiers.  Patient Location: Home  Provider Location: Home Office  I discussed the limitations of evaluation and management by telemedicine. The patient expressed understanding and agreed to proceed.  Vital Signs: Because this visit was a virtual/telehealth visit, some criteria may be missing or patient reported. Any vitals not documented were not able to be obtained and vitals that have been documented are patient reported.  Cardiac Risk Factors include: advanced age (>55men, >59 women);diabetes mellitus;family history of premature cardiovascular disease;hypertension     Objective:    Today's Vitals   08/14/23 1132  PainSc: 4    There is no height or weight on file to calculate BMI.     08/14/2023   11:40 AM 10/06/2021   12:11 PM 04/13/2021    2:04 PM 03/20/2016    9:33 PM 10/02/2015    7:40 AM 09/15/2015   10:00 AM 09/14/2015    6:06 PM  Advanced Directives  Does Patient Have a Medical Advance Directive? No No No No No No No  Would patient like information on creating a medical advance directive? No - Patient declined  No - Patient declined  No - patient declined information No - patient declined information     Current Medications (verified) Outpatient Encounter Medications as of 08/14/2023  Medication Sig   acetaminophen (TYLENOL) 325 MG tablet Take 650 mg by mouth every 6 (six) hours as needed for mild pain.   aspirin 81 MG EC tablet Adult Low Dose Aspirin 81 mg tablet,delayed release  Take 1 tablet every day by oral route.   atorvastatin (LIPITOR) 20 MG tablet Take 20 mg by mouth daily.   carvedilol (COREG) 25 MG tablet Take 1 tablet by mouth 2 (two) times daily with a  meal.   Cholecalciferol 25 MCG (1000 UT) tablet Take 1,000 Units by mouth daily.   Continuous Glucose Sensor (DEXCOM G7 SENSOR) MISC by Does not apply route.   CORLANOR 5 MG TABS tablet TAKE 1 TABLET BY MOUTH TWICE A DAY WITH MEALS   cyclobenzaprine (FLEXERIL) 10 MG tablet TAKE 1 TABLET (10 MG TOTAL) BY MOUTH IN THE MORNING AND AT BEDTIME   famotidine (PEPCID) 40 MG tablet Take 40 mg by mouth at bedtime.   FLUoxetine (PROZAC) 20 MG capsule 1 tablet daily   furosemide (LASIX) 40 MG tablet Take 40 mg by mouth 2 (two) times daily.   gabapentin (NEURONTIN) 600 MG tablet Take 1 tablet (600 mg total) by mouth 2 (two) times daily.   Insulin Degludec (TRESIBA [place]) Inject 20 Units into the skin daily.   Iron-FA-B Cmp-C-Biot-Probiotic (FUSION PLUS) CAPS Take 1 capsule by mouth daily. Take 1 capsule by mouth daily.   levothyroxine (SYNTHROID) 112 MCG tablet Take 112 mcg by mouth daily before breakfast.   Multiple Vitamin (MULTIVITAMIN WITH MINERALS) TABS tablet Take 1 tablet by mouth daily.   NOVOTWIST 32G X 5 MM MISC Use as directed with Victozia.   sacubitril-valsartan (ENTRESTO) 24-26 MG Take 1 tablet by mouth 2 (two) times daily.   Semaglutide (OZEMPIC, 1 MG/DOSE, Enhaut) Inject 1 mg into the skin once a week.   spironolactone (ALDACTONE) 25 MG tablet Take 0.5 tablets (12.5 mg total) by mouth daily.   traMADol (  ULTRAM) 50 MG tablet TAKE 1 TABLET (50 MG TOTAL) BY MOUTH EVERY 6 (SIX) HOURS AS NEEDED FOR MODERATE PAIN   Urine Glucose-Ketones Test (KETO-DIASTIX) STRP    No facility-administered encounter medications on file as of 08/14/2023.    Allergies (verified) Nitrofurantoin   History: Past Medical History:  Diagnosis Date   Anemia    CHF (congestive heart failure) (HCC)    Depression    Diabetes mellitus without complication (HCC)    Fibromyalgia    Hyperlipidemia    Hypertension    Hypothyroidism    Past Surgical History:  Procedure Laterality Date   CARDIAC CATHETERIZATION N/A  09/16/2015   Procedure: Right/Left Heart Cath and Coronary Angiography;  Surgeon: Peter M Swazil[place], MD;  Location: [facility];  Service: Cardiovascular;  Laterality: N/A;   ENDOMETRIAL ABLATION  2012   Family History  Problem Relation Age of Onset   Pulmonary fibrosis Mother    Hypertension Mother    Atrial fibrillation Mother    Hypertension Father    Diabetes Mellitus II Father    Kidney disease Father    Diabetes Mellitus II Brother    Hypertension Brother    Atrial fibrillation Maternal Grandmother    Hypertension Maternal Grandmother    Stroke Maternal Grandmother    Social History   Socioeconomic History   Marital status: Married    Spouse name: Not on file   Number of children: Not on file   Years of education: Not on file   Highest education level: Not on file  Occupational History   Not on file  Tobacco Use   Smoking status: Never   Smokeless tobacco: Never  Substance and Sexual Activity   Alcohol use: No   Drug use: No   Sexual activity: Yes    Birth control/protection: None  Other Topics Concern   Not on file  Social History Narrative   Not on file   Social Determinants of Health   Financial Resource Strain: Low Risk  (08/14/2023)   Overall Financial Resource Strain (CARDIA)    Difficulty of Paying Living Expenses: Not hard at all  Food Insecurity: No Food Insecurity (08/14/2023)   Hunger Vital Sign    Worried About Running Out of Food in the Last Year: Never true    Ran Out of Food in the Last Year: Never true  Transportation Needs: No Transportation Needs (08/14/2023)   PRAPARE - Administrator, Civil Service (Medical): No    Lack of Transportation (Non-Medical): No  Physical Activity: Insufficiently Active (08/14/2023)   Exercise Vital Sign    Days of Exercise per Week: 3 days    Minutes of Exercise per Session: 30 min  Stress: No Stress Concern Present (08/14/2023)   [employer] of Occupational Health - Occupational  Stress Questionnaire    Feeling of Stress : Not at all  Social Connections: Moderately Integrated (08/14/2023)   Social Connection and Isolation Panel [NHANES]    Frequency of Communication with Friends and Family: More than three times a week    Frequency of Social Gatherings with Friends and Family: Never    Attends Religious Services: More than 4 times per year    Active Member of [employer] or Organizations: No    Attends Banker Meetings: Never    Marital Status: Married    Tobacco Counseling Counseling given: Not Answered   Clinical Intake:  Pre-visit preparation completed: Yes  Pain : 0-10 Pain Score: 4  Pain Location:  (  fibromyalgia) Pain Descriptors / Indicators: Constant, Burning, Aching, Dull Pain Onset: More than a month ago Pain Frequency: Constant     Diabetes: Yes CBG done?: No Did pt. bring in CBG monitor from home?: No  How often do you need to have someone help you when you read instructions, pamphlets, or other written materials from your doctor or pharmacy?: 1 - Never  Interpreter Needed?: No  Information entered by :: Remi Haggard LPN   Activities of Daily Living    08/14/2023   11:41 AM  In your present state of health, do you have any difficulty performing the following activities:  Hearing? 0  Vision? 0  Difficulty concentrating or making decisions? 0  Walking or climbing stairs? 1  Dressing or bathing? 0  Doing errands, shopping? 0  Preparing Food and eating ? N  Using the Toilet? N  In the past six months, have you accidently leaked urine? Y  Do you have problems with loss of bowel control? N  Managing your Medications? N  Managing your Finances? N  Housekeeping or managing your Housekeeping? N    Patient Care Team: Shade Flood, MD as PCP - General (Family Medicine) Quintella Reichert, MD as PCP - Cardiology (Cardiology) Laurey Morale, MD as PCP - Advanced Heart Failure (Cardiology)  Indicate any recent Medical  Services you may have received from other than [facility] providers in the past year (date may be approximate).     Assessment:   This is a routine wellness examination for Saadiya.  Hearing/Vision screen Hearing Screening - Comments:: No trouble hearing Vision Screening - Comments:: Up to date [place]   Goals Addressed             This Visit's Progress    Patient Stated       Continue current lifestyle       Depression Screen    08/14/2023   11:39 AM 06/20/2023   11:02 AM 04/25/2023   10:35 AM 10/18/2022   10:07 AM 05/10/2022   10:39 AM 11/02/2021   10:23 AM 07/27/2021    3:15 PM  PHQ 2/9 Scores  PHQ - 2 Score 2 1 4 2 2  0 0  PHQ- 9 Score 4 7 14 4 10 4      Fall Risk    08/14/2023   11:34 AM 06/20/2023   11:02 AM 04/25/2023   10:35 AM 10/18/2022   10:08 AM 05/10/2022   10:39 AM  Fall Risk   Falls in the past year? 1 0 0 0 0  Number falls in past yr: 1 0 0 0 0  Injury with Fall? 0 0 0 0 0  Risk for fall due to :  No Fall Risks No Fall Risks No Fall Risks No Fall Risks  Follow up Falls evaluation completed;Education provided;Falls prevention discussed Falls evaluation completed Falls evaluation completed Falls evaluation completed Falls evaluation completed    MEDICARE RISK AT HOME: Medicare Risk at Home Any stairs in or around the home?: Yes If so, are there any without handrails?: No Home free of loose throw rugs in walkways, pet beds, electrical cords, etc?: Yes Adequate lighting in your home to reduce risk of falls?: Yes Life alert?: No Use of a cane, walker or w/c?: No Grab bars in the bathroom?: Yes Shower chair or bench in shower?: No Elevated toilet seat or a handicapped toilet?: No  TIMED UP AND GO:  Was the test performed?  No    Cognitive Function:  08/14/2023   11:42 AM  6CIT Screen  What Year? 0 points  What month? 0 points  What time? 0 points  Count back from 20 0 points  Months in reverse 0 points  Repeat phrase 0 points  Total Score  0 points    Immunizations Immunization History  Administered Date(s) Administered   Influenza,inj,Quad PF,6+ Mos 07/27/2021, 09/04/2022   Moderna Sars-Covid-2 Vaccination 01/16/2020, 02/19/2020, 09/17/2020   Tdap 02/14/2021   Zoster, Unspecified 09/19/2022    TDAP status: Due, Education has been provided regarding the importance of this vaccine. Advised may receive this vaccine at local pharmacy or Health Dept. Aware to provide a copy of the vaccination record if obtained from local pharmacy or Health Dept. Verbalized acceptance and understanding.  Flu Vaccine status: Due, Education has been provided regarding the importance of this vaccine. Advised may receive this vaccine at local pharmacy or Health Dept. Aware to provide a copy of the vaccination record if obtained from local pharmacy or Health Dept. Verbalized acceptance and understanding.    Covid-19 vaccine status: Information provided on how to obtain vaccines.   Qualifies for Shingles Vaccine? Yes   Zostavax completed Yes   Shingrix Completed?: No.    Education has been provided regarding the importance of this vaccine. Patient has been advised to call insurance company to determine out of pocket expense if they have not yet received this vaccine. Advised may also receive vaccine at local pharmacy or Health Dept. Verbalized acceptance and understanding.  Screening Tests Health Maintenance  Topic Date Due   Diabetic kidney evaluation - Urine ACR  Never done   Zoster Vaccines- Shingrix (1 of 2) 10/08/1982   Colonoscopy  Never done   HEMOGLOBIN A1C  11/29/2022   OPHTHALMOLOGY EXAM  02/07/2023   COVID-19 Vaccine (4 - 2023-24 season) 06/30/2023   INFLUENZA VACCINE  01/27/2024 (Originally 05/30/2023)   FOOT EXAM  10/19/2023   Diabetic kidney evaluation - eGFR measurement  07/23/2024   Medicare Annual Wellness (AWV)  08/13/2024   MAMMOGRAM  10/17/2024   Cervical Cancer Screening (HPV/Pap Cotest)  01/09/2025   Hepatitis C  Screening  Completed   HIV Screening  Completed   HPV VACCINES  Aged Out   DTaP/Tdap/Td  Discontinued    Health Maintenance  Health Maintenance Due  Topic Date Due   Diabetic kidney evaluation - Urine ACR  Never done   Zoster Vaccines- Shingrix (1 of 2) 10/08/1982   Colonoscopy  Never done   HEMOGLOBIN A1C  11/29/2022   OPHTHALMOLOGY EXAM  02/07/2023   COVID-19 Vaccine (4 - 2023-24 season) 06/30/2023    Colorectal cancer screening: Type of screening: Colonoscopy. Completed 2021 per patient . Repeat every 5 years  Mammogram status: Completed 20223. Repeat every year    Lung Cancer Screening: (Low Dose CT Chest recommended if Age 98-80 years, 20 pack-year currently smoking OR have quit w/in 15years.) does not qualify.   Lung Cancer Screening Referral:   Additional Screening:  Hepatitis C Screening: does not qualify; Completed 2024  Vision Screening: Recommended annual ophthalmology exams for early detection of glaucoma and other disorders of the eye. Is the patient up to date with their annual eye exam?  Yes  Who is the provider or what is the name of the office in which the patient attends annual eye exams?  If pt is not established with a provider, would they like to be referred to a provider to establish care? No .   Dental Screening: Recommended annual dental exams  for proper oral hygiene  Nutrition Risk Assessment:  Has the patient had any N/V/D within the last 2 months?  No  Does the patient have any non-healing wounds?  No  Has the patient had any unintentional weight loss or weight gain?  No   Diabetes:  Is the patient diabetic?  Yes  If diabetic, was a CBG obtained today?  No  Did the patient bring in their glucometer from home?  No  How often do you monitor your CBG's? 2 x a day.   Financial Strains and Diabetes Management:  Are you having any financial strains with the device, your supplies or your medication? No .  Does the patient want to be seen by  Chronic Care Management for management of their diabetes?  No  Would the patient like to be referred to a Nutritionist or for Diabetic Management?  No   Diabetic Exams:\ Pt has been advised about the importance in completing this exam. A referral has been placed today.   Diabetic Foot Exam: . Pt has been advised about the importance in completing this exam. .    Community Resource Referral / Chronic Care Management: CRR required this visit?  No   CCM required this visit?  No     Plan:     I have personally reviewed and noted the following in the patient's chart:   Medical and social history Use of alcohol, tobacco or illicit drugs  Current medications and supplements including opioid prescriptions. Patient is not currently taking opioid prescriptions. Functional ability and status Nutritional status Physical activity Advanced directives List of other physicians Hospitalizations, surgeries, and ER visits in previous 12 months Vitals Screenings to include cognitive, depression, and falls Referrals and appointments  In addition, I have reviewed and discussed with patient certain preventive protocols, quality metrics, and best practice recommendations. A written personalized care plan for preventive services as well as general preventive health recommendations were provided to patient.     Remi Haggard, LPN   [id number]   After Visit Summary: (MyChart) Due to this being a telephonic visit, the after visit summary with patients personalized plan was offered to patient via MyChart   Nurse Notes:

## 2023-08-18 ENCOUNTER — Other Ambulatory Visit: Payer: Self-pay | Admitting: Family Medicine

## 2023-08-18 DIAGNOSIS — M797 Fibromyalgia: Secondary | ICD-10-CM

## 2023-08-24 ENCOUNTER — Encounter: Payer: Self-pay | Admitting: Family Medicine

## 2023-08-24 DIAGNOSIS — M797 Fibromyalgia: Secondary | ICD-10-CM

## 2023-08-26 MED ORDER — CYCLOBENZAPRINE HCL 10 MG PO TABS
10.0000 mg | ORAL_TABLET | Freq: Two times a day (BID) | ORAL | 1 refills | Status: DC
Start: 2023-08-26 — End: 2023-10-01

## 2023-08-27 ENCOUNTER — Ambulatory Visit: Payer: Medicare HMO

## 2023-08-28 ENCOUNTER — Ambulatory Visit (INDEPENDENT_AMBULATORY_CARE_PROVIDER_SITE_OTHER): Payer: Medicare HMO

## 2023-08-28 DIAGNOSIS — Z23 Encounter for immunization: Secondary | ICD-10-CM

## 2023-08-28 NOTE — Progress Notes (Signed)
Robin Owen is a 62 y.o. female presents to the office today for Flu Shotper physician's orders. Injection was administered Intramuscular Left deltoid.   Patient's next injection due 2025, appt made? no  [place] [name]

## 2023-08-29 ENCOUNTER — Other Ambulatory Visit: Payer: Self-pay | Admitting: Family Medicine

## 2023-08-29 DIAGNOSIS — F339 Major depressive disorder, recurrent, unspecified: Secondary | ICD-10-CM

## 2023-08-29 DIAGNOSIS — M797 Fibromyalgia: Secondary | ICD-10-CM

## 2023-09-06 ENCOUNTER — Other Ambulatory Visit: Payer: Self-pay | Admitting: Family Medicine

## 2023-09-06 DIAGNOSIS — F339 Major depressive disorder, recurrent, unspecified: Secondary | ICD-10-CM

## 2023-09-06 DIAGNOSIS — M797 Fibromyalgia: Secondary | ICD-10-CM

## 2023-10-01 ENCOUNTER — Other Ambulatory Visit: Payer: Self-pay | Admitting: Family Medicine

## 2023-10-01 ENCOUNTER — Other Ambulatory Visit (HOSPITAL_COMMUNITY): Payer: Self-pay | Admitting: Cardiology

## 2023-10-01 DIAGNOSIS — I5022 Chronic systolic (congestive) heart failure: Secondary | ICD-10-CM

## 2023-10-01 DIAGNOSIS — M797 Fibromyalgia: Secondary | ICD-10-CM

## 2023-10-08 ENCOUNTER — Other Ambulatory Visit: Payer: Self-pay | Admitting: Family Medicine

## 2023-10-10 ENCOUNTER — Other Ambulatory Visit: Payer: Self-pay | Admitting: Family Medicine

## 2023-10-10 DIAGNOSIS — M797 Fibromyalgia: Secondary | ICD-10-CM

## 2023-10-11 NOTE — Telephone Encounter (Signed)
Requested Prescriptions   Pending Prescriptions Disp Refills   traMADol (ULTRAM) 50 MG tablet [Pharmacy Med Name: TRAMADOL HCL 50 MG TABLET] 180 tablet 0    Sig: TAKE 1 TABLET (50 MG TOTAL) BY MOUTH EVERY 6 (SIX) HOURS AS NEEDED FOR MODERATE PAIN     Date of patient request: 10/11/2023 Last office visit: 08/28/2023 Upcoming visit: 10/31/2023 Date of last refill: 06/04/2023 Last refill amount: #180 0 refills

## 2023-10-11 NOTE — Telephone Encounter (Signed)
Controlled substance database reviewed, tramadol No. 180 last filled on 06/04/2023.Medication discussed at June physical.  Refilled.

## 2023-10-29 DIAGNOSIS — Z1231 Encounter for screening mammogram for malignant neoplasm of breast: Secondary | ICD-10-CM | POA: Diagnosis not present

## 2023-10-31 ENCOUNTER — Encounter: Payer: Self-pay | Admitting: Family Medicine

## 2023-10-31 ENCOUNTER — Ambulatory Visit (INDEPENDENT_AMBULATORY_CARE_PROVIDER_SITE_OTHER): Payer: Medicare HMO | Admitting: Family Medicine

## 2023-10-31 VITALS — BP 122/80 | HR 66 | Temp 98.0°F | Ht 65.0 in | Wt 170.4 lb

## 2023-10-31 DIAGNOSIS — N1831 Chronic kidney disease, stage 3a: Secondary | ICD-10-CM | POA: Diagnosis not present

## 2023-10-31 DIAGNOSIS — I5022 Chronic systolic (congestive) heart failure: Secondary | ICD-10-CM

## 2023-10-31 DIAGNOSIS — Z794 Long term (current) use of insulin: Secondary | ICD-10-CM | POA: Diagnosis not present

## 2023-10-31 DIAGNOSIS — E039 Hypothyroidism, unspecified: Secondary | ICD-10-CM

## 2023-10-31 DIAGNOSIS — E1165 Type 2 diabetes mellitus with hyperglycemia: Secondary | ICD-10-CM | POA: Diagnosis not present

## 2023-10-31 DIAGNOSIS — I10 Essential (primary) hypertension: Secondary | ICD-10-CM

## 2023-10-31 DIAGNOSIS — M797 Fibromyalgia: Secondary | ICD-10-CM

## 2023-10-31 LAB — BASIC METABOLIC PANEL
BUN: 29 mg/dL — ABNORMAL HIGH (ref 6–23)
CO2: 31 meq/L (ref 19–32)
Calcium: 9.5 mg/dL (ref 8.4–10.5)
Chloride: 97 meq/L (ref 96–112)
Creatinine, Ser: 1.26 mg/dL — ABNORMAL HIGH (ref 0.40–1.20)
GFR: 46.55 mL/min — ABNORMAL LOW (ref >60.00)
Glucose, Bld: 165 mg/dL — ABNORMAL HIGH (ref 70–99)
Potassium: 4.9 meq/L (ref 3.5–5.1)
Sodium: 137 meq/L (ref 135–145)

## 2023-10-31 NOTE — Progress Notes (Signed)
 Subjective:  Patient ID: Robin Owen, female    DOB: 26-Mar-1961  Age: 63 y.o. MRN: [id number]  CC:  Chief Complaint  Patient presents with   Medical Management of Chronic Issues    Pt notes no concerns, feeling well     HPI Robin Owen presents for   Hypertension: With history of OSA on CPAP, NYHA class II CHF, and chronic kidney disease. Treated with spironolactone  25 mg daily, Lasix  40 mg daily, carvedilol  25 mg twice daily, Entresto  49/51 mg twice daily, Corlanor  5 mg twice daily.  Previously attempted SGLT2 but discontinued due to recurrent mycotic infections. Most recent creatinine September 25 slightly higher than previous range.  1.46 compared to prior 1.17 on 07/02/2023.  Maintenance of hydration discussed by cardiology.  CHF clinic eval 06/27/2023. Recent hyperkalemia, AKI with creatinine up to 2.28, potassium 6.3, Entresto  and spironolactone  were held with improvement in labs.  Entresto  was restarted then option to restart spironolactone  12.5 mg daily.  She is taking the half dose of spirinolactone 12.5mg , and now on Entresto  24-26mg  every day.  No new CP/dyspnea. No new swelling or weight gain.  Home readings:110/70 BP Readings from Last 3 Encounters:  10/31/23 122/80  06/27/23 124/76  06/20/23 122/72   Wt Readings from Last 3 Encounters:  10/31/23 170 lb 6.4 oz (77.3 kg)  06/27/23 171 lb (77.6 kg)  06/20/23 169 lb 12.8 oz (77 kg)    Lab Results  Component Value Date   CREATININE 1.46 (H) 07/24/2023   Diabetes: With chronic kidney disease, hyperglycemia followed by endocrinology.  Treated with Tresiba, semaglutide, metformin . Off metformin . Still taking tresiba 20u and semaglutide.  Appt with new endocrinologist in June or July.  Microalbumin: 05/29/2022, and again 05/21/23.  Optho, foot exam, pneumovax:  Optho in 2024.  [facility]2 at  [facility].  Covid vaccine booster - declines.  Had foot exam last visit with endocrine.  Due for GI eval in April - planned  colonoscopy this year.   Lab Results  Component Value Date   HGBA1C 8.1 (H) 03/21/2016   Lab Results  Component Value Date   LDLCALC 92 06/02/2020   CREATININE 1.46 (H) 07/24/2023   Hypothyroidism: Lab Results  Component Value Date   TSH 1.990 09/16/2015  Also followed by endocrinology treated with Synthroid  112 mcg daily.  Fibromyalgia with depression Fluoxetine  20 mg daily for depression symptoms.  Counseling has been discussed previously but declined.  Gabapentin  600 mg twice daily, Flexeril  10 mg twice daily and tramadol  typically twice daily for fibromyalgia symptoms. Meds managing symptoms, no recent need for additional meds and no new side effects.  Controlled substance database reviewed.  Tramadol  No. 1 80 last filled on 10/11/2023, gabapentin  180 last filled on 08/29/2023.  Reviewed over the past year.  No apparent concerns.  History Patient Active Problem List   Diagnosis Date Noted   Pyelonephritis 03/21/2016   Sepsis (HCC) 03/21/2016   Hyponatremia 03/21/2016   Type 2 diabetes mellitus without complication, without long-term current use of insulin  (HCC) 03/21/2016   Normocytic anemia 03/21/2016   Chronic systolic heart failure (HCC) 10/02/2015   Nonischemic cardiomyopathy (HCC)    Pulmonary hypertension (HCC)    Hyperlipidemia 09/14/2015   Essential hypertension 09/14/2015   Fibromyalgia 09/14/2015   Hypothyroidism 09/14/2015   Past Medical History:  Diagnosis Date   Anemia    CHF (congestive heart failure) (HCC)    Depression    Diabetes mellitus without complication (HCC)    Fibromyalgia  Hyperlipidemia    Hypertension    Hypothyroidism    Past Surgical History:  Procedure Laterality Date   CARDIAC CATHETERIZATION N/A 09/16/2015   Procedure: Right/Left Heart Cath and Coronary Angiography;  Surgeon: Peter M Jordan, MD;  Location: [facility] INVASIVE CV LAB;  Service: Cardiovascular;  Laterality: N/A;   ENDOMETRIAL ABLATION  2012   Allergies  Allergen  Reactions   Nitrofurantoin Other (See Comments)    Fever, flu like symtoms   Prior to Admission medications   Medication Sig Start Date End Date Taking? Authorizing Provider  acetaminophen  (TYLENOL ) 325 MG tablet Take 650 mg by mouth every 6 (six) hours as needed for mild pain.   Yes [provider]  aspirin  81 MG EC tablet Adult Low Dose Aspirin  81 mg tablet,delayed release  Take 1 tablet every day by oral route.   Yes [provider]  atorvastatin  (LIPITOR) 20 MG tablet Take 20 mg by mouth daily. 01/08/20  Yes [provider]  carvedilol  (COREG ) 25 MG tablet Take 1 tablet by mouth 2 (two) times daily with a meal.   Yes [provider]  Cholecalciferol 25 MCG (1000 UT) tablet Take 1,000 Units by mouth daily.   Yes [provider]  Continuous Glucose Sensor (DEXCOM G7 SENSOR) MISC by Does not apply route.   Yes [provider]  CORLANOR  5 MG TABS tablet TAKE 1 TABLET BY MOUTH TWICE A DAY WITH FOOD 10/01/23  Yes Rolan Ezra RAMAN, MD  cyclobenzaprine  (FLEXERIL ) 10 MG tablet TAKE 1 TABLET (10 MG TOTAL) BY MOUTH IN THE MORNING AND AT BEDTIME 10/01/23  Yes Levora Reyes SAUNDERS, MD  ENTRESTO  49-51 MG Take 1 tablet by mouth 2 (two) times daily. 09/22/23  Yes [provider]  famotidine (PEPCID) 40 MG tablet Take 40 mg by mouth at bedtime. 05/10/20  Yes [provider]  FLUoxetine  (PROZAC ) 20 MG capsule TAKE 1 CAPSULE EVERY DAY 09/06/23  Yes Levora Reyes SAUNDERS, MD  FLUoxetine  (PROZAC ) 20 MG tablet TAKE 1 TABLET BY MOUTH EVERY DAY 09/06/23  Yes Levora Reyes SAUNDERS, MD  furosemide  (LASIX ) 40 MG tablet Take 40 mg by mouth 2 (two) times daily.   Yes [provider]  gabapentin  (NEURONTIN ) 600 MG tablet TAKE 1 TABLET BY MOUTH TWICE A DAY 09/06/23  Yes Levora Reyes SAUNDERS, MD  Insulin  Degludec (TRESIBA [place]) Inject 20 Units into the skin daily.   Yes [provider]  Iron-FA-B Cmp-C-Biot-Probiotic (FUSION PLUS) CAPS TAKE 1 CAPSULE BY  MOUTH DAILY. TAKE 1 CAPSULE BY MOUTH DAILY. 10/08/23  Yes Levora Reyes SAUNDERS, MD  levothyroxine  (SYNTHROID ) 112 MCG tablet Take 112 mcg by mouth daily before breakfast.   Yes [provider]  Multiple Vitamin (MULTIVITAMIN WITH MINERALS) TABS tablet Take 1 tablet by mouth daily.   Yes [provider]  NOVOTWIST 32G X 5 MM MISC Use as directed with Victozia. 08/19/15  Yes [provider]  sacubitril -valsartan  (ENTRESTO ) 24-26 MG Take 1 tablet by mouth 2 (two) times daily.   Yes [provider]  Semaglutide (OZEMPIC, 1 MG/DOSE, [place]) Inject 1 mg into the skin once a week.   Yes [provider]  traMADol  (ULTRAM ) 50 MG tablet TAKE 1 TABLET (50 MG TOTAL) BY MOUTH EVERY 6 (SIX) HOURS AS NEEDED FOR MODERATE PAIN 10/11/23  Yes Levora Reyes SAUNDERS, MD  Urine Glucose-Ketones Test [facility]) STRP  09/01/17  Yes [provider]  spironolactone  (ALDACTONE ) 25 MG tablet Take 0.5 tablets (12.5 mg total) by mouth daily. 06/28/23  09/26/23  Marcine Caffie HERO, PA-C   Social History   Socioeconomic History   Marital status: Married    Spouse name: Not on file   Number of children: Not on file   Years of education: Not on file   Highest education level: Not on file  Occupational History   Not on file  Tobacco Use   Smoking status: Never   Smokeless tobacco: Never  Substance and Sexual Activity   Alcohol use: No   Drug use: No   Sexual activity: Yes    Birth control/protection: None  Other Topics Concern   Not on file  Social History Narrative   Not on file   Social Drivers of Health   Financial Resource Strain: Low Risk  (08/14/2023)   Overall Financial Resource Strain (CARDIA)    Difficulty of Paying Living Expenses: Not hard at all  Food Insecurity: No Food Insecurity (08/14/2023)   Hunger Vital Sign    Worried About Running Out of Food in the Last Year: Never true    Ran Out of Food in the Last Year: Never true  Transportation Needs: No  Transportation Needs (08/14/2023)   PRAPARE - Administrator, Civil Service (Medical): No    Lack of Transportation (Non-Medical): No  Physical Activity: Insufficiently Active (08/14/2023)   Exercise Vital Sign    Days of Exercise per Week: 3 days    Minutes of Exercise per Session: 30 min  Stress: No Stress Concern Present (08/14/2023)   [employer] of Occupational Health - Occupational Stress Questionnaire    Feeling of Stress : Not at all  Social Connections: Moderately Integrated (08/14/2023)   Social Connection and Isolation Panel [NHANES]    Frequency of Communication with Friends and Family: More than three times a week    Frequency of Social Gatherings with Friends and Family: Never    Attends Religious Services: More than 4 times per year    Active Member of [employer] or Organizations: No    Attends Banker Meetings: Never    Marital Status: Married  Catering Manager Violence: Not At Risk (08/14/2023)   Humiliation, Afraid, Rape, and Kick questionnaire    Fear of Current or Ex-Partner: No    Emotionally Abused: No    Physically Abused: No    Sexually Abused: No    Review of Systems  Per HPI.  Objective:   Vitals:   10/31/23 1015  BP: 122/80  Pulse: 66  Temp: 98 F (36.7 C)  TempSrc: Temporal  SpO2: 96%  Weight: 170 lb 6.4 oz (77.3 kg)  Height: 5' 5 (1.651 m)     Physical Exam Vitals reviewed.  Constitutional:      Appearance: Normal appearance. She is well-developed.  HENT:     Head: Normocephalic and atraumatic.  Eyes:     Conjunctiva/sclera: Conjunctivae normal.     Pupils: Pupils are equal, round, and reactive to light.  Neck:     Vascular: No carotid bruit.  Cardiovascular:     Rate and Rhythm: Normal rate and regular rhythm.     Heart sounds: Normal heart sounds.  Pulmonary:     Effort: Pulmonary effort is normal.     Breath sounds: Normal breath sounds.  Abdominal:     Palpations: Abdomen is soft. There is no  pulsatile mass.     Tenderness: There is no abdominal tenderness.  Musculoskeletal:     Right lower leg: No edema.     Left lower leg:  No edema.     Comments: Pain-free hip range of motion, knee range of motion.  Skin:    General: Skin is warm and dry.  Neurological:     Mental Status: She is alert and oriented to person, place, and time.  Psychiatric:        Mood and Affect: Mood normal.        Behavior: Behavior normal.        Assessment & Plan:  Robin Owen is a 63 y.o. female . Fibromyalgia  -Stable with current regimen of gabapentin , Flexeril , tramadol  without any side effects.  Potential side effects and risk of been discussed previously.  Controlled substance database reviewed as above.  Will refill meds when needed, and 74-month follow-up.  RTC precautions if new symptoms or side effects.  Type 2 diabetes mellitus with hyperglycemia, with long-term current use of insulin  (HCC)  -Followed by endocrinology.  Continue follow-up as planned.  No med changes.  Hypothyroidism, unspecified type  -As above, follow-up with endocrinology.  Essential hypertension Chronic systolic heart failure (HCC) - Plan: Basic metabolic panel -Stable in office, followed by cardiology as above.  Appears euvolemic.  Continue to monitor renal function and check a BMP today with prior hyperkalemia and elevated creatinine.  Consider nephrology eval if persistent elevated creatinine or worsening.  CKD 3a previously.  Stage 3a chronic kidney disease (HCC) - Plan: Basic metabolic panel  -As above, maintain hydration, avoid nephrotoxins, consider nephrology evaluation if worsening creatinine  No orders of the defined types were placed in this encounter.  Patient Instructions  No medication changes at this time.  I will recheck the kidney function on blood work today as well as potassium as that has been off previously.  Continue follow-up with your specialists as planned.  I am not changing any  medications today.  We can keep an eye on your kidney function test but if that starts to increase I would recommend meeting with nephrology and I can place that referral if needed.  Follow-up with me in 6 months but let me know if there are questions or new symptoms in the meantime.  Happy new year.    Signed,   Reyes Pines, MD [facility], [facility] 10/31/23 10:57 AM

## 2023-10-31 NOTE — Patient Instructions (Signed)
 No medication changes at this time.  I will recheck the kidney function on blood work today as well as potassium as that has been off previously.  Continue follow-up with your specialists as planned.  I am not changing any medications today.  We can keep an eye on your kidney function test but if that starts to increase I would recommend meeting with nephrology and I can place that referral if needed.  Follow-up with me in 6 months but let me know if there are questions or new symptoms in the meantime.  Happy new year.

## 2023-12-03 ENCOUNTER — Encounter (HOSPITAL_COMMUNITY): Payer: Self-pay

## 2023-12-03 ENCOUNTER — Ambulatory Visit (HOSPITAL_COMMUNITY)
Admission: RE | Admit: 2023-12-03 | Discharge: 2023-12-03 | Disposition: A | Discharge status: Home / Self Care | Payer: Medicare HMO | Source: Ambulatory Visit | Attending: Family Medicine | Admitting: Family Medicine

## 2023-12-03 VITALS — BP 108/64 | HR 57 | Resp 18 | Ht 66.0 in | Wt 167.8 lb

## 2023-12-03 DIAGNOSIS — Z794 Long term (current) use of insulin: Secondary | ICD-10-CM | POA: Diagnosis not present

## 2023-12-03 DIAGNOSIS — I428 Other cardiomyopathies: Secondary | ICD-10-CM | POA: Insufficient documentation

## 2023-12-03 DIAGNOSIS — I5022 Chronic systolic (congestive) heart failure: Secondary | ICD-10-CM | POA: Diagnosis not present

## 2023-12-03 DIAGNOSIS — G4733 Obstructive sleep apnea (adult) (pediatric): Secondary | ICD-10-CM | POA: Diagnosis not present

## 2023-12-03 DIAGNOSIS — M797 Fibromyalgia: Secondary | ICD-10-CM | POA: Diagnosis not present

## 2023-12-03 DIAGNOSIS — E039 Hypothyroidism, unspecified: Secondary | ICD-10-CM | POA: Diagnosis not present

## 2023-12-03 DIAGNOSIS — Z833 Family history of diabetes mellitus: Secondary | ICD-10-CM | POA: Insufficient documentation

## 2023-12-03 DIAGNOSIS — E118 Type 2 diabetes mellitus with unspecified complications: Secondary | ICD-10-CM | POA: Diagnosis not present

## 2023-12-03 DIAGNOSIS — Z79899 Other long term (current) drug therapy: Secondary | ICD-10-CM | POA: Diagnosis not present

## 2023-12-03 DIAGNOSIS — Z8249 Family history of ischemic heart disease and other diseases of the circulatory system: Secondary | ICD-10-CM | POA: Diagnosis not present

## 2023-12-03 DIAGNOSIS — E119 Type 2 diabetes mellitus without complications: Secondary | ICD-10-CM | POA: Diagnosis not present

## 2023-12-03 DIAGNOSIS — I110 Hypertensive heart disease with heart failure: Secondary | ICD-10-CM | POA: Insufficient documentation

## 2023-12-03 DIAGNOSIS — Z7985 Long-term (current) use of injectable non-insulin antidiabetic drugs: Secondary | ICD-10-CM | POA: Diagnosis not present

## 2023-12-03 DIAGNOSIS — E785 Hyperlipidemia, unspecified: Secondary | ICD-10-CM | POA: Insufficient documentation

## 2023-12-03 NOTE — Addendum Note (Signed)
Encounter addended by: Jacklynn Ganong, FNP on: 12/03/2023 3:31 PM  Actions taken: Clinical Note Signed

## 2023-12-03 NOTE — Patient Instructions (Signed)
 Medication Changes:  STOP CORLANOR  (IVABRADINE )  Follow-Up in: 6 MONTHS WITH ECHO WITH DR. ROLAN PLEASE CALL OUR OFFICE AROUND JUNE 2025 TO GET SCHEDULED FOR YOUR APPOINTMENT. PHONE NUMBER IS [phone] OPTION 2   At the [facility], you and your health needs are our priority. We have a designated team specialized in the treatment of Heart Failure. This Care Team includes your primary Heart Failure Specialized Cardiologist (physician), Advanced Practice Providers (APPs- Physician Assistants and Nurse Practitioners), and Pharmacist who all work together to provide you with the care you need, when you need it.   You may see any of the following providers on your designated Care Team at your next follow up:  Dr. Toribio Fuel Dr. Ezra Rolan Dr. Ria Commander Dr. Odis Brownie Greig Mosses, NP Caffie Shed, [place] [facility] [place], [place] Beckey Coe, NP Jordan Lee, NP Tinnie Redman, PharmD   Please be sure to bring in all your medications bottles to every appointment.   Need to Contact Us :  If you have any questions or concerns before your next appointment please send us  a message through [place] or call our office at [phone].    TO LEAVE A MESSAGE FOR THE NURSE SELECT OPTION 2, PLEASE LEAVE A MESSAGE INCLUDING: YOUR NAME DATE OF BIRTH CALL BACK NUMBER REASON FOR CALL**this is important as we prioritize the call backs  YOU WILL RECEIVE A CALL BACK THE SAME DAY AS LONG AS YOU CALL BEFORE 4:00 PM

## 2023-12-03 NOTE — Progress Notes (Addendum)
 Date:  12/03/2023   ID:  Reena DELENA Molt, DOB 1961-08-17, MRN [id number]   Provider location: [place] Type of Visit: Established patient  PCP:  Levora Reyes SAUNDERS, MD  HF Cardiologist:  Dr. Rolan   HPI: Robin Owen is a 63 y.o. female who has a past medical history of HTN, HLD, DMII, hypothyroidism and fibromyalgia (on disability), and CHF.    Admitted 11/16 through 09/17/15 with increased dyspnea. CXR with pulmonary edema. This prompted and ECHO that showed reduced EF 20%. RHC/LHC showed normal coronaries and reduced cardiac index. Cardiac MRI showed some mid-wall late gadolinium enhancement in the septum.  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 Started on coreg  and lasix . Continued on lisinopril . Discharge weight was 163 pounds.    She developed AKI and hyperkalemia in the setting of high dose Ibuprofen.  She was instructed to stop this and she was started on tramadol  as needed instead.     Echo in 11/18 showed increase in EF to 50-55%.       Echo 10/24/18 LVEF 45-50%, Grade 1 DD, Mild MR, PA peak pressure 24 mm Hg. Echo in 8/21 showed EF 50-55%, basal inferior hypokinesis, normal RV.  Echo in 11/22 showed EF 50-55%, normal RV, normal IVC.   Echo 8/24 EF 55-60%. RV normal.    Follow up 8/24, NYHA II and euvolemic.   Today she returns for HF follow up. Overall feeling fine. She has SOB walking up inclines. Denies  palpitations, abnormal bleeding, CP, dizziness, edema, or PND/Orthopnea. Appetite ok. No fever or chills. Weight at home 164 pounds. Taking all medications. No longer wears CPAP x 2 months, husband says she doesn't snore much anymore.  ECG (personally reviewed): NSR with PVC  Labs 1/17: SPEP negative Labs 3/22: K 4.7, creatinine 1.18, LDL 88, HDL 45 Labs 9/22: LDL 75, K 5, creatinine 1.28 Labs 1/23: LDL 74, K 5.3, creatinine 1.4 Labs 12/23: K 4.2, creatinine 1.26 Labs (05/21/23): K 6.3, creatinine 2.28 Labs (05/28/23): K 4.1, creatinine 1.19 Labs (8/24):  K 4.1, creatinine 1.23  Labs (1/25): K 4.9, creatinine 1.26  Cardiac Studies Echo 11/16: EF 20% MV mild regurgitation TV mild regurgitation   cMRI 11/16: LVEF 20-25%, RV moderately down, moderate R and L atrial dilatation, mild to moderate MR   RHC/LHC 11/16: RA 7, PCWP 29, CO/CI 3.11/1.7, normal cors  Echo (5/17) with EF 35-40%.  Echo (12/17) with EF 35-40%, mild LV dilation.  CPX (12/17) with RER 1.13, peak VO2 20.8, VE/VCO2 slope 30. Echo (11/18): EF 50-55%. Echo (12/19): LVEF 45-50%, Grade 1 DD, Mild MR, PA peak pressure 24 mm Hg Echo (8/21): EF 50-55%, basal inferior hypokinesis, normal RV size and systolic function.  Echo (11/22): EF 50-55%, normal RV, normal IVC Echo (8/23): EF 55-60%, RV normal  Echo (8/24): EF 55-60%, RV normal.   Past Medical History:  Diagnosis Date   Anemia    CHF (congestive heart failure) (HCC)    Depression    Diabetes mellitus without complication (HCC)    Fibromyalgia    Hyperlipidemia    Hypertension    Hypothyroidism    Current Outpatient Medications  Medication Sig Dispense Refill   acetaminophen  (TYLENOL ) 325 MG tablet Take 650 mg by mouth every 6 (six) hours as needed for mild pain.     aspirin  81 MG EC tablet Adult Low Dose Aspirin  81 mg tablet,delayed release  Take 1 tablet every day by oral route.     atorvastatin  (LIPITOR) 20  MG tablet Take 20 mg by mouth daily.     carvedilol  (COREG ) 25 MG tablet Take 1 tablet by mouth 2 (two) times daily with a meal.     Cholecalciferol 25 MCG (1000 UT) tablet Take 1,000 Units by mouth daily.     Continuous Glucose Sensor (DEXCOM G7 SENSOR) MISC by Does not apply route.     CORLANOR  5 MG TABS tablet TAKE 1 TABLET BY MOUTH TWICE A DAY WITH FOOD 180 tablet 3   cyclobenzaprine  (FLEXERIL ) 10 MG tablet TAKE 1 TABLET (10 MG TOTAL) BY MOUTH IN THE MORNING AND AT BEDTIME 60 tablet 1   ENTRESTO  49-51 MG Take 1 tablet by mouth 2 (two) times daily.     famotidine (PEPCID) 40 MG tablet Take 40 mg by mouth at  bedtime.     FLUoxetine  (PROZAC ) 20 MG capsule TAKE 1 CAPSULE EVERY DAY 90 capsule 1   FLUoxetine  (PROZAC ) 20 MG tablet TAKE 1 TABLET BY MOUTH EVERY DAY 90 tablet 1   furosemide  (LASIX ) 40 MG tablet Take 40 mg by mouth 2 (two) times daily.     gabapentin  (NEURONTIN ) 600 MG tablet TAKE 1 TABLET BY MOUTH TWICE A DAY 180 tablet 1   Insulin  Degludec (TRESIBA [place]) Inject 20 Units into the skin daily.     Iron-FA-B Cmp-C-Biot-Probiotic (FUSION PLUS) CAPS TAKE 1 CAPSULE BY MOUTH DAILY. TAKE 1 CAPSULE BY MOUTH DAILY. 90 capsule 3   levothyroxine  (SYNTHROID ) 112 MCG tablet Take 112 mcg by mouth daily before breakfast.     Multiple Vitamin (MULTIVITAMIN WITH MINERALS) TABS tablet Take 1 tablet by mouth daily.     NOVOTWIST 32G X 5 MM MISC Use as directed with Victozia.  2   sacubitril -valsartan  (ENTRESTO ) 24-26 MG Take 1 tablet by mouth 2 (two) times daily.     Semaglutide (OZEMPIC, 1 MG/DOSE, [place]) Inject 1 mg into the skin once a week.     spironolactone  (ALDACTONE ) 25 MG tablet Take 0.5 tablets (12.5 mg total) by mouth daily. 15 tablet 11   traMADol  (ULTRAM ) 50 MG tablet TAKE 1 TABLET (50 MG TOTAL) BY MOUTH EVERY 6 (SIX) HOURS AS NEEDED FOR MODERATE PAIN 180 tablet 0   Urine Glucose-Ketones Test (KETO-DIASTIX) STRP      No current facility-administered medications for this visit.   Allergies:   Nitrofurantoin   Social History:  The patient  reports that she has never smoked. She has never used smokeless tobacco. She reports that she does not drink alcohol and does not use drugs.   Family History:  The patient's family history includes Atrial fibrillation in her maternal grandmother and mother; Diabetes Mellitus II in her brother and father; Hypertension in her brother, father, maternal grandmother, and mother; Kidney disease in her father; Pulmonary fibrosis in her mother; Stroke in her maternal grandmother.   ROS:  Please see the history of present illness.   All other systems are personally reviewed  and negative.   Wt Readings from Last 3 Encounters:  12/03/23 76.1 kg (167 lb 12.8 oz)  10/31/23 77.3 kg (170 lb 6.4 oz)  06/27/23 77.6 kg (171 lb)   BP 108/64   Pulse (!) 57   Resp 18   Ht 5' 6 (1.676 m)   Wt 76.1 kg (167 lb 12.8 oz)   SpO2 98%   BMI 27.08 kg/m   Physical Exam:   General:  NAD. No resp difficulty, walked into clinic HEENT: Normal Neck: Supple. No JVD. Cor: Regular rate & rhythm. No  rubs, gallops or murmurs. Lungs: Clear Abdomen: Soft, nontender, nondistended.  Extremities: No cyanosis, clubbing, rash, edema Neuro: Alert & oriented x 3, moves all 4 extremities w/o difficulty. Affect pleasant.  Assessment & Plan: 1. Chronic systolic CHF: Echo 11/16 showed EF 20%. cMRI EF 20-25%, RV mod dilated.  cMRI (11/16) showed an area of mid-wall septal late gadolinium enhancement. NICM perhaps related to HTN versus viral myocarditis. SPEP negative.  09/16/2015 RHC/LHC coronaries ok, low cardiac index 1.7.  Echo  5/17 and again in 12/17 showed some improvement, EF 35-40%.  CPX in 12/17 showed low normal peak VO2 with no clear cardiopulmonary limitation. Echo in 11/18 showed EF up to 50-55%, Echo in 12/19 showed EF 45-50%, echo in 8/21 showed EF 50-55%.  Echo in 11/22 was stable with EF 50-55%. Echo 8/23 EF 55-60%, RV normal. Echo 8/24 EF 55-60%, RV normal.  Stable NYHA class II symptoms, she is not volume overloaded today. - HR high 50s-low 60s. Stop ivabradine . - Continue Lasix  40 mg bid. Labs reviewed from 10/31/23 reviewed and are stable, K 4.9, SCr 1.26 - Continue Coreg  25 mg bid. - Continue spironolactone  12.5 mg daily. - Continue Entresto  24-26 mg bid. - off Jardiance due to recurrent yeast infections and UTIs  2. Diabetes: Off empagliflozin due to recurrent yeast infection.  3. OSA: Continue CPAP nightly.  - No change. 4. Hyperlipidemia: continue statin. LDL 79 (05/21/23) - Followed by PCP   5. Fibromyalgia: Disabled since 1996.  - No change.   Follow up in 6 months  with Dr. Rolan + echo. If EF remains stable, consider graduation from AHF clinic.  Signed, Harlene CHRISTELLA Gainer, FNP  12/03/2023  [facility] [address] [facility] [place] [address] [phone] (office) [phone] (fax)

## 2023-12-08 ENCOUNTER — Other Ambulatory Visit: Payer: Self-pay | Admitting: Family Medicine

## 2023-12-08 DIAGNOSIS — M797 Fibromyalgia: Secondary | ICD-10-CM

## 2023-12-09 ENCOUNTER — Telehealth (HOSPITAL_COMMUNITY): Payer: Self-pay | Admitting: Cardiology

## 2023-12-09 NOTE — Telephone Encounter (Signed)
 Requested Prescriptions   Pending Prescriptions Disp Refills   cyclobenzaprine  (FLEXERIL ) 10 MG tablet [Pharmacy Med Name: CYCLOBENZAPRINE  10 MG TABLET] 60 tablet 1    Sig: TAKE 1 TABLET (10 MG TOTAL) BY MOUTH IN THE MORNING AND AT BEDTIME     Date of patient request: 12/09/2023 Last office visit: 10/31/2023 Upcoming visit: 04/30/2024 Date of last refill:10/01/2023 Last refill amount: 60

## 2023-12-09 NOTE — Telephone Encounter (Signed)
 Patient left VM on triage line with concerns regarding HR  Reports at last OV corlanor  was discontinued 2/4because resting HR was in the 50-60 Now resting HR is in the 90's  -would like to know if she should restart corlanor    -returned call for details No answer

## 2023-12-10 ENCOUNTER — Encounter (HOSPITAL_COMMUNITY): Payer: Self-pay | Admitting: Cardiology

## 2023-12-10 NOTE — Telephone Encounter (Signed)
Office visit to discuss medication January 2.  Flexeril reordered.

## 2023-12-10 NOTE — Telephone Encounter (Signed)
Pt returned call Reports increase in palps otherwise asymptomatic  B/P 120/80 -declines dizziness or CP -declines SOB

## 2023-12-10 NOTE — Telephone Encounter (Signed)
   Please call and set up 14 day zio for palpitations.    Caedence Snowden NP-C  10:18 AM

## 2023-12-11 NOTE — Telephone Encounter (Signed)
[facility] MESSAGE SENT

## 2023-12-16 ENCOUNTER — Other Ambulatory Visit (HOSPITAL_COMMUNITY): Payer: Self-pay | Admitting: Cardiology

## 2023-12-16 DIAGNOSIS — E1169 Type 2 diabetes mellitus with other specified complication: Secondary | ICD-10-CM

## 2023-12-17 ENCOUNTER — Encounter (HOSPITAL_COMMUNITY): Payer: Self-pay

## 2023-12-17 ENCOUNTER — Ambulatory Visit (HOSPITAL_COMMUNITY)
Admission: RE | Admit: 2023-12-17 | Discharge: 2023-12-17 | Disposition: A | Discharge status: Home / Self Care | Payer: Medicare HMO | Source: Ambulatory Visit | Attending: Cardiology | Admitting: Cardiology

## 2023-12-17 ENCOUNTER — Inpatient Hospital Stay (HOSPITAL_COMMUNITY)
Admission: RE | Admit: 2023-12-17 | Discharge: 2023-12-17 | Disposition: A | Discharge status: Home / Self Care | Payer: Medicare HMO | Source: Ambulatory Visit | Attending: Cardiology | Admitting: Cardiology

## 2023-12-17 VITALS — BP 117/75 | HR 87 | Resp 12 | Wt 162.0 lb

## 2023-12-17 DIAGNOSIS — E118 Type 2 diabetes mellitus with unspecified complications: Secondary | ICD-10-CM

## 2023-12-17 DIAGNOSIS — Z79899 Other long term (current) drug therapy: Secondary | ICD-10-CM | POA: Insufficient documentation

## 2023-12-17 DIAGNOSIS — E039 Hypothyroidism, unspecified: Secondary | ICD-10-CM | POA: Insufficient documentation

## 2023-12-17 DIAGNOSIS — Z794 Long term (current) use of insulin: Secondary | ICD-10-CM | POA: Diagnosis not present

## 2023-12-17 DIAGNOSIS — R000 Tachycardia, unspecified: Secondary | ICD-10-CM

## 2023-12-17 DIAGNOSIS — M797 Fibromyalgia: Secondary | ICD-10-CM | POA: Insufficient documentation

## 2023-12-17 DIAGNOSIS — I428 Other cardiomyopathies: Secondary | ICD-10-CM | POA: Diagnosis not present

## 2023-12-17 DIAGNOSIS — E785 Hyperlipidemia, unspecified: Secondary | ICD-10-CM | POA: Insufficient documentation

## 2023-12-17 DIAGNOSIS — E119 Type 2 diabetes mellitus without complications: Secondary | ICD-10-CM | POA: Diagnosis not present

## 2023-12-17 DIAGNOSIS — I10 Essential (primary) hypertension: Secondary | ICD-10-CM | POA: Diagnosis not present

## 2023-12-17 DIAGNOSIS — I5022 Chronic systolic (congestive) heart failure: Secondary | ICD-10-CM | POA: Diagnosis not present

## 2023-12-17 DIAGNOSIS — G4733 Obstructive sleep apnea (adult) (pediatric): Secondary | ICD-10-CM | POA: Insufficient documentation

## 2023-12-17 DIAGNOSIS — I110 Hypertensive heart disease with heart failure: Secondary | ICD-10-CM | POA: Insufficient documentation

## 2023-12-17 NOTE — Progress Notes (Signed)
Date:  12/17/2023   ID:  Robin Owen, DOB 05-09-1961, MRN [id number]   Provider location: [facility] Type of Visit: Established patient  PCP:  Robin Flood, MD  HF Cardiologist:  Dr. Shirlee Latch   HPI: Robin Owen is a 63 y.o. female who has a past medical history of HTN, HLD, DMII, hypothyroidism and fibromyalgia (on disability), and CHF.    Admitted 11/16 through 09/17/15 with increased dyspnea. CXR with pulmonary edema. This prompted and ECHO that showed reduced EF 20%. RHC/LHC showed normal coronaries and reduced cardiac index. Cardiac MRI showed some mid-wall late gadolinium enhancement in the septum.  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 Started on coreg and lasix. Continued on lisinopril. Discharge weight was 163 pounds.    She developed AKI and hyperkalemia in the setting of high dose Ibuprofen.  She was instructed to stop this and she was started on tramadol as needed instead.     Echo in 11/18 showed increase in EF to 50-55%.       Echo 10/24/18 LVEF 45-50%, Grade 1 DD, Mild MR, PA peak pressure 24 mm Hg. Echo in 8/21 showed EF 50-55%, basal inferior hypokinesis, normal RV.  Echo in 11/22 showed EF 50-55%, normal RV, normal IVC.   Echo 8/24 EF 55-60%. RV normal.   Today she returns for an acute visit. I saw her last week and HR was in the 50's and Corlanor was stopped. Her Fitbit has shown HR 90's since then. No dizziness or palpitations. She is concerned with her jump in HR. Overall feeling fine. She has SOB walking up inclines. Denies abnormal bleeding, CP, edema, or PND/Orthopnea. Appetite ok. No fever or chills. Weight at home 164 pounds. Taking all medications. No longer wears CPAP x 2 months, husband says she doesn't snore much anymore.  ECG (personally reviewed): NSR 90s, to ectopy  Labs 1/17: SPEP negative Labs 3/22: K 4.7, creatinine 1.18, LDL 88, HDL 45 Labs 9/22: LDL 75, K 5, creatinine 1.28 Labs 1/23: LDL 74, K 5.3, creatinine 1.4 Labs 12/23:  K 4.2, creatinine 1.26 Labs (05/21/23): K 6.3, creatinine 2.28 Labs (05/28/23): K 4.1, creatinine 1.19 Labs (8/24): K 4.1, creatinine 1.23  Labs (1/25): K 4.9, creatinine 1.26  Cardiac Studies Echo 11/16: EF 20% MV mild regurgitation TV mild regurgitation   cMRI 11/16: LVEF 20-25%, RV moderately down, moderate R and L atrial dilatation, mild to moderate MR   RHC/LHC 11/16: RA 7, PCWP 29, CO/CI 3.11/1.7, normal cors  Echo (5/17) with EF 35-40%.  Echo (12/17) with EF 35-40%, mild LV dilation.  CPX (12/17) with RER 1.13, peak VO2 20.8, VE/VCO2 slope 30. Echo (11/18): EF 50-55%. Echo (12/19): LVEF 45-50%, Grade 1 DD, Mild MR, PA peak pressure 24 mm Hg Echo (8/21): EF 50-55%, basal inferior hypokinesis, normal RV size and systolic function.  Echo (11/22): EF 50-55%, normal RV, normal IVC Echo (8/23): EF 55-60%, RV normal  Echo (8/24): EF 55-60%, RV normal.   Past Medical History:  Diagnosis Date   Anemia    CHF (congestive heart failure) (HCC)    Depression    Diabetes mellitus without complication (HCC)    Fibromyalgia    Hyperlipidemia    Hypertension    Hypothyroidism    Current Outpatient Medications  Medication Sig Dispense Refill   acetaminophen (TYLENOL) 325 MG tablet Take 650 mg by mouth every 6 (six) hours as needed for mild pain.     aspirin 81 MG EC tablet Adult  Low Dose Aspirin 81 mg tablet,delayed release  Take 1 tablet every day by oral route.     atorvastatin (LIPITOR) 20 MG tablet Take 20 mg by mouth daily.     carvedilol (COREG) 25 MG tablet Take 1 tablet by mouth 2 (two) times daily with a meal.     Cholecalciferol 25 MCG (1000 UT) tablet Take 1,000 Units by mouth daily.     Continuous Glucose Sensor (DEXCOM G7 SENSOR) MISC by Does not apply route.     cyclobenzaprine (FLEXERIL) 10 MG tablet TAKE 1 TABLET (10 MG TOTAL) BY MOUTH IN THE MORNING AND AT BEDTIME 60 tablet 1   ENTRESTO 24-26 MG TAKE 1 TABLET BY MOUTH TWICE A DAY 60 tablet 3   famotidine (PEPCID) 40  MG tablet Take 40 mg by mouth at bedtime.     FLUoxetine (PROZAC) 20 MG capsule TAKE 1 CAPSULE EVERY DAY 90 capsule 1   furosemide (LASIX) 40 MG tablet Take 40 mg by mouth 2 (two) times daily.     gabapentin (NEURONTIN) 600 MG tablet TAKE 1 TABLET BY MOUTH TWICE A DAY 180 tablet 1   Insulin Degludec (TRESIBA [employer]) Inject 20 Units into the skin daily.     Iron-FA-B Cmp-C-Biot-Probiotic (FUSION PLUS) CAPS TAKE 1 CAPSULE BY MOUTH DAILY. TAKE 1 CAPSULE BY MOUTH DAILY. 90 capsule 3   levothyroxine (SYNTHROID) 112 MCG tablet Take 112 mcg by mouth daily before breakfast.     Multiple Vitamin (MULTIVITAMIN WITH MINERALS) TABS tablet Take 1 tablet by mouth daily.     NOVOTWIST 32G X 5 MM MISC Use as directed with Victozia.  2   Semaglutide (OZEMPIC, 1 MG/DOSE, [place]) Inject 1 mg into the skin once a week.     spironolactone (ALDACTONE) 25 MG tablet Take 0.5 tablets (12.5 mg total) by mouth daily. 15 tablet 11   traMADol (ULTRAM) 50 MG tablet TAKE 1 TABLET (50 MG TOTAL) BY MOUTH EVERY 6 (SIX) HOURS AS NEEDED FOR MODERATE PAIN 180 tablet 0   Urine Glucose-Ketones Test (KETO-DIASTIX) STRP      No current facility-administered medications for this encounter.   Allergies:   Nitrofurantoin   Social History:  The patient  reports that she has never smoked. She has never used smokeless tobacco. She reports that she does not drink alcohol and does not use drugs.   Family History:  The patient's family history includes Atrial fibrillation in her maternal grandmother and mother; Diabetes Mellitus II in her brother and father; Hypertension in her brother, father, maternal grandmother, and mother; Kidney disease in her father; Pulmonary fibrosis in her mother; Stroke in her maternal grandmother.   ROS:  Please see the history of present illness.   All other systems are personally reviewed and negative.   Wt Readings from Last 3 Encounters:  12/17/23 73.5 kg (162 lb)  12/03/23 76.1 kg (167 lb 12.8 oz)  10/31/23 77.3  kg (170 lb 6.4 oz)   BP 117/75   Pulse 87   Resp 12   Wt 73.5 kg (162 lb)   SpO2 96%   BMI 26.15 kg/m   Physical Exam:   General:  NAD. No resp difficulty HEENT: Normal Neck: Supple. No JVD. Cor: Regular rate & rhythm. No rubs, gallops or murmurs. Lungs: Clear Abdomen: Soft, nontender, nondistended.  Extremities: No cyanosis, clubbing, rash, edema Neuro: Alert & oriented x 3, moves all 4 extremities w/o difficulty. Affect pleasant.  Assessment & Plan: 1. Chronic systolic CHF: Echo 11/16 showed EF 20%.  cMRI EF 20-25%, RV mod dilated.  cMRI (11/16) showed an area of mid-wall septal late gadolinium enhancement. NICM perhaps related to HTN versus viral myocarditis. SPEP negative.  09/16/2015 RHC/LHC coronaries ok, low cardiac index 1.7.  Echo  5/17 and again in 12/17 showed some improvement, EF 35-40%.  CPX in 12/17 showed low normal peak VO2 with no clear cardiopulmonary limitation. Echo in 11/18 showed EF up to 50-55%, Echo in 12/19 showed EF 45-50%, echo in 8/21 showed EF 50-55%.  Echo in 11/22 was stable with EF 50-55%. Echo 8/23 EF 55-60%, RV normal. Echo 8/24 EF 55-60%, RV normal.  Stable NYHA class II symptoms, she is not volume overloaded today. - Continue Lasix 40 mg bid. Labs reviewed from 10/31/23 reviewed and are stable, K 4.9, SCr 1.26 - Continue Coreg 25 mg bid. - Continue spironolactone 12.5 mg daily. - Continue Entresto 24-26 mg bid. - off Jardiance due to recurrent yeast infections and UTIs  2. Diabetes: Off empagliflozin due to recurrent yeast infection.  3. OSA: Continue CPAP nightly.  - No change. 4. Hyperlipidemia: continue statin. LDL 79 (05/21/23) - Followed by PCP   5. Fibromyalgia: Disabled since 1996.  - No change.  6. Sinus Rhythm/ST: HR 90's today, she is asymptomatic. - Place 2 week Zio to ensure no atrial arrhythmias - Will leave off Corlanor for now as she is not symptomatic  Follow up in 6 months with Dr. Shirlee Latch + echo. If EF remains stable, consider  graduation from AHF clinic.  Signed, Jacklynn Ganong, FNP  12/17/2023  [facility] [address] [facility] [place] [address] [phone] (office) [phone] (fax)

## 2023-12-18 ENCOUNTER — Other Ambulatory Visit: Payer: Self-pay | Admitting: Family Medicine

## 2023-12-18 DIAGNOSIS — M797 Fibromyalgia: Secondary | ICD-10-CM

## 2023-12-27 ENCOUNTER — Other Ambulatory Visit (HOSPITAL_COMMUNITY): Payer: Self-pay | Admitting: Internal Medicine

## 2023-12-27 ENCOUNTER — Other Ambulatory Visit (HOSPITAL_COMMUNITY): Payer: Self-pay | Admitting: Cardiology

## 2024-01-10 DIAGNOSIS — R000 Tachycardia, unspecified: Secondary | ICD-10-CM | POA: Diagnosis not present

## 2024-01-12 ENCOUNTER — Other Ambulatory Visit (HOSPITAL_COMMUNITY): Payer: Self-pay | Admitting: Cardiology

## 2024-01-12 ENCOUNTER — Other Ambulatory Visit: Payer: Self-pay | Admitting: Family Medicine

## 2024-01-12 DIAGNOSIS — M797 Fibromyalgia: Secondary | ICD-10-CM

## 2024-01-13 ENCOUNTER — Telehealth (HOSPITAL_COMMUNITY): Payer: Self-pay | Admitting: *Deleted

## 2024-01-13 NOTE — Telephone Encounter (Signed)
 Requested Prescriptions   Pending Prescriptions Disp Refills   traMADol (ULTRAM) 50 MG tablet [Pharmacy Med Name: TRAMADOL HCL 50 MG TABLET] 180 tablet 0    Sig: TAKE 1 TABLET (50 MG TOTAL) BY MOUTH EVERY 6 (SIX) HOURS AS NEEDED FOR MODERATE PAIN     Date of patient request: 01/13/2024 Last office visit: 10/31/2023 Upcoming visit: 04/30/2024 Date of last refill: 10/11/2023 Last refill amount: 180

## 2024-01-13 NOTE — Telephone Encounter (Signed)
 Called patient per Dr. Shirlee Latch with following long term monitor results:  "Rare PVCs.  No changes to plan"  Pt verbalized understanding of same. No further questions at this time.

## 2024-01-14 NOTE — Telephone Encounter (Signed)
 History of fibromyalgia, medications discussed at her January 2 visit.  Controlled substance database reviewed.  Tramadol last filled for #180 on 10/11/2023, previously 06/04/2023, refill ordered.

## 2024-01-21 ENCOUNTER — Other Ambulatory Visit (HOSPITAL_COMMUNITY): Payer: Self-pay | Admitting: Cardiology

## 2024-01-21 DIAGNOSIS — I5022 Chronic systolic (congestive) heart failure: Secondary | ICD-10-CM

## 2024-02-04 ENCOUNTER — Other Ambulatory Visit: Payer: Self-pay | Admitting: Family Medicine

## 2024-02-04 DIAGNOSIS — F339 Major depressive disorder, recurrent, unspecified: Secondary | ICD-10-CM

## 2024-02-04 DIAGNOSIS — M797 Fibromyalgia: Secondary | ICD-10-CM

## 2024-03-02 ENCOUNTER — Other Ambulatory Visit (HOSPITAL_COMMUNITY): Payer: Self-pay | Admitting: Cardiology

## 2024-03-02 ENCOUNTER — Other Ambulatory Visit: Payer: Self-pay | Admitting: Family Medicine

## 2024-03-02 DIAGNOSIS — M797 Fibromyalgia: Secondary | ICD-10-CM

## 2024-03-31 DIAGNOSIS — E1165 Type 2 diabetes mellitus with hyperglycemia: Secondary | ICD-10-CM | POA: Diagnosis not present

## 2024-03-31 DIAGNOSIS — E1122 Type 2 diabetes mellitus with diabetic chronic kidney disease: Secondary | ICD-10-CM | POA: Diagnosis not present

## 2024-03-31 DIAGNOSIS — Z794 Long term (current) use of insulin: Secondary | ICD-10-CM | POA: Diagnosis not present

## 2024-03-31 DIAGNOSIS — N1832 Chronic kidney disease, stage 3b: Secondary | ICD-10-CM | POA: Diagnosis not present

## 2024-03-31 DIAGNOSIS — I129 Hypertensive chronic kidney disease with stage 1 through stage 4 chronic kidney disease, or unspecified chronic kidney disease: Secondary | ICD-10-CM | POA: Diagnosis not present

## 2024-03-31 DIAGNOSIS — I43 Cardiomyopathy in diseases classified elsewhere: Secondary | ICD-10-CM | POA: Diagnosis not present

## 2024-03-31 DIAGNOSIS — E1169 Type 2 diabetes mellitus with other specified complication: Secondary | ICD-10-CM | POA: Diagnosis not present

## 2024-03-31 DIAGNOSIS — E785 Hyperlipidemia, unspecified: Secondary | ICD-10-CM | POA: Diagnosis not present

## 2024-04-07 ENCOUNTER — Other Ambulatory Visit: Payer: Self-pay | Admitting: Family Medicine

## 2024-04-07 ENCOUNTER — Other Ambulatory Visit (HOSPITAL_COMMUNITY): Payer: Self-pay | Admitting: Cardiology

## 2024-04-07 DIAGNOSIS — E1165 Type 2 diabetes mellitus with hyperglycemia: Secondary | ICD-10-CM | POA: Diagnosis not present

## 2024-04-07 DIAGNOSIS — M797 Fibromyalgia: Secondary | ICD-10-CM

## 2024-04-07 DIAGNOSIS — E039 Hypothyroidism, unspecified: Secondary | ICD-10-CM | POA: Diagnosis not present

## 2024-04-07 DIAGNOSIS — Z794 Long term (current) use of insulin: Secondary | ICD-10-CM | POA: Diagnosis not present

## 2024-04-11 ENCOUNTER — Other Ambulatory Visit: Payer: Self-pay | Admitting: Family Medicine

## 2024-04-11 ENCOUNTER — Other Ambulatory Visit (HOSPITAL_COMMUNITY): Payer: Self-pay | Admitting: Cardiology

## 2024-04-11 DIAGNOSIS — M797 Fibromyalgia: Secondary | ICD-10-CM

## 2024-04-11 DIAGNOSIS — E1169 Type 2 diabetes mellitus with other specified complication: Secondary | ICD-10-CM

## 2024-04-13 ENCOUNTER — Telehealth: Admitting: Physician Assistant

## 2024-04-13 DIAGNOSIS — R3989 Other symptoms and signs involving the genitourinary system: Secondary | ICD-10-CM

## 2024-04-13 MED ORDER — CEPHALEXIN 500 MG PO CAPS
500.0000 mg | ORAL_CAPSULE | Freq: Two times a day (BID) | ORAL | 0 refills | Status: DC
Start: 2024-04-13 — End: 2024-06-08

## 2024-04-13 NOTE — Progress Notes (Signed)

## 2024-04-13 NOTE — Telephone Encounter (Signed)
 Fibromyalgia treatment discussed in January.  Continued on gabapentin  twice daily, Flexeril  twice daily, tramadol  twice daily.  Controlled substance database reviewed.  Tramadol  No. 180 last filled on 01/14/2024.  Refill ordered.

## 2024-04-28 ENCOUNTER — Other Ambulatory Visit: Payer: Self-pay | Admitting: Family Medicine

## 2024-04-28 DIAGNOSIS — Z1211 Encounter for screening for malignant neoplasm of colon: Secondary | ICD-10-CM | POA: Diagnosis not present

## 2024-04-28 DIAGNOSIS — M797 Fibromyalgia: Secondary | ICD-10-CM

## 2024-04-28 DIAGNOSIS — K219 Gastro-esophageal reflux disease without esophagitis: Secondary | ICD-10-CM | POA: Diagnosis not present

## 2024-04-28 DIAGNOSIS — D509 Iron deficiency anemia, unspecified: Secondary | ICD-10-CM | POA: Diagnosis not present

## 2024-04-28 NOTE — Telephone Encounter (Signed)
 Requested Prescriptions   Pending Prescriptions Disp Refills   cyclobenzaprine  (FLEXERIL ) 10 MG tablet [Pharmacy Med Name: CYCLOBENZAPRINE  10 MG TABLET] 60 tablet 1    Sig: TAKE 1 TABLET (10 MG TOTAL) BY MOUTH IN THE MORNING AND AT BEDTIME     Date of patient request: 04/28/2024 Last office visit: Visit date not found Upcoming visit: 04/30/2024 Date of last refill: 03/02/2024 Last refill amount: 60x1

## 2024-04-28 NOTE — Telephone Encounter (Signed)
 Please call patient and make her an appointment she has not had a non acute visit in over a year

## 2024-04-28 NOTE — Telephone Encounter (Signed)
 Patient appt has been confirmed

## 2024-04-30 ENCOUNTER — Ambulatory Visit: Payer: Medicare HMO | Admitting: Family Medicine

## 2024-04-30 VITALS — BP 126/60 | HR 85 | Temp 98.7°F | Resp 17 | Ht 66.0 in | Wt 164.8 lb

## 2024-04-30 DIAGNOSIS — M797 Fibromyalgia: Secondary | ICD-10-CM

## 2024-04-30 DIAGNOSIS — I5022 Chronic systolic (congestive) heart failure: Secondary | ICD-10-CM

## 2024-04-30 DIAGNOSIS — G5703 Lesion of sciatic nerve, bilateral lower limbs: Secondary | ICD-10-CM

## 2024-04-30 DIAGNOSIS — Z794 Long term (current) use of insulin: Secondary | ICD-10-CM

## 2024-04-30 DIAGNOSIS — E785 Hyperlipidemia, unspecified: Secondary | ICD-10-CM

## 2024-04-30 DIAGNOSIS — I10 Essential (primary) hypertension: Secondary | ICD-10-CM | POA: Diagnosis not present

## 2024-04-30 DIAGNOSIS — N1831 Chronic kidney disease, stage 3a: Secondary | ICD-10-CM

## 2024-04-30 DIAGNOSIS — Z23 Encounter for immunization: Secondary | ICD-10-CM | POA: Diagnosis not present

## 2024-04-30 DIAGNOSIS — E1165 Type 2 diabetes mellitus with hyperglycemia: Secondary | ICD-10-CM | POA: Diagnosis not present

## 2024-04-30 DIAGNOSIS — E039 Hypothyroidism, unspecified: Secondary | ICD-10-CM

## 2024-04-30 LAB — COMPREHENSIVE METABOLIC PANEL WITH GFR
ALT: 30 U/L (ref 0–35)
AST: 26 U/L (ref 0–37)
Albumin: 4.2 g/dL (ref 3.5–5.2)
Alkaline Phosphatase: 73 U/L (ref 39–117)
BUN: 14 mg/dL (ref 6–23)
CO2: 33 meq/L — ABNORMAL HIGH (ref 19–32)
Calcium: 9.3 mg/dL (ref 8.4–10.5)
Chloride: 97 meq/L (ref 96–112)
Creatinine, Ser: 1 mg/dL (ref 0.40–1.20)
GFR: 61.21 mL/min (ref >60.00)
Glucose, Bld: 198 mg/dL — ABNORMAL HIGH (ref 70–99)
Potassium: 4.6 meq/L (ref 3.5–5.1)
Sodium: 137 meq/L (ref 135–145)
Total Bilirubin: 0.6 mg/dL (ref 0.2–1.2)
Total Protein: 7 g/dL (ref 6.0–8.3)

## 2024-04-30 LAB — LIPID PANEL
Cholesterol: 140 mg/dL (ref 0–200)
HDL: 46.6 mg/dL (ref >39.00)
LDL Cholesterol: 67 mg/dL (ref 0–99)
NonHDL: 93.77
Total CHOL/HDL Ratio: 3
Triglycerides: 135 mg/dL (ref 0.0–149.0)
VLDL: 27 mg/dL (ref 0.0–40.0)

## 2024-04-30 NOTE — Patient Instructions (Addendum)
 Recent labs were reviewed.  Thank you for coming in today. No change in medications at this time. I recommend discussing the iron question with gastroenterology, but recent labs look ok to me.   Hip pain appears to be piriformis syndrome. Try the stretches we discussed a few times per day and let me know if that is not improving in next few weeks.   Piriformis Syndrome  Piriformis syndrome is a condition that can cause pain and numbness in your buttocks and down the back of your leg. Piriformis syndrome happens when the small muscle that connects the base of your spine to your hip (piriformis muscle) presses on the nerve that runs down the back of your leg (sciatic nerve). The piriformis muscle helps your hip rotate and helps to bring your leg back and out. It also helps shift your weight to keep you stable while you are walking. The sciatic nerve runs under or through the piriformis muscle. Damage to the piriformis muscle can cause spasms that put pressure on the nerve below. This causes pain and discomfort while sitting and moving. The pain may feel as if it begins in the buttock and spreads (radiates) down your hip and thigh. What are the causes? This condition is caused by pressure on the sciatic nerve from the piriformis muscle. The piriformis muscle can get irritated with overuse, especially if other hip muscles are weak and the piriformis muscle has to do extra work. Piriformis syndrome can also occur after an injury, like a fall onto your buttocks. What increases the risk? You are more likely to develop this condition if you: Are a woman. Sit for long periods of time. Are a cyclist. Have weak buttocks muscles (gluteal muscles). What are the signs or symptoms? Symptoms of this condition include: Pain, tingling, or numbness that starts in the buttock and runs down the back of your leg (sciatica). Pain in the groin or thigh area. Your symptoms may get worse: The longer you sit. When you  walk, run, or climb stairs. When straining to have a bowel movement. How is this diagnosed? This condition is diagnosed based on your symptoms, medical history, and physical exam. During the exam, your health care provider may: Move your leg into different positions to check for pain. Press on the muscles of your hip and buttock to see if that increases your symptoms. You may also have tests, including: Imaging tests such as X-rays, CT, MRI, or ultrasound. Electromyogram (EMG). This test measures electrical signals sent by your nerves into the muscles. Nerve conduction study. This test measures how well electrical signals pass through your nerves. How is this treated? This condition may be treated by: Stopping all activities that cause pain or make your condition worse. Applying ice or using heat therapy. Taking medicines to reduce pain and swelling. Taking a muscle relaxer (muscle relaxant) to stop muscle spasms. Doing range-of-motion and strengthening exercises (physical therapy) as told by your health care provider. Having massage, acupuncture, or local electrical stimulation (transcutaneous electrical nerve stimulation, TENS). Getting an injection of medicine in the piriformis muscle. Your health care provider will choose the medicine based on your condition. He or she may inject: An anti-inflammatory medicine (steroid) to reduce swelling. A numbing medicine (local anesthetic) to block the pain. Botulinum toxin. The toxin blocks nerve impulses to specific muscles to reduce muscle tension. In rare cases, you may need surgery to cut the muscle and release pressure on the nerve if other treatments do not work. Follow these  instructions at home: Activity Do not sit for long periods. Get up and walk around every 20 minutes or as often as told by your health care provider. When driving long distances, make sure to take frequent stops to get up and stretch. Use a cushion when you sit on hard  surfaces. Do exercises as told by your health care provider. Return to your normal activities as told by your health care provider. Ask your health care provider what activities are safe for you. Managing pain, stiffness, and swelling     If directed, apply heat to the area as often as told by your health care provider. Use the heat source that your health care provider recommends, such as a moist heat pack or a heating pad. Place a towel between your skin and the heat source. Leave the heat on for 20-30 minutes. Remove the heat if your skin turns bright red. This is especially important if you are unable to feel pain, heat, or cold. You have a greater risk of getting burned. If directed, put ice on the injured area. To do this: Put ice in a plastic bag. Place a towel between your skin and the bag. Leave the ice on for 20 minutes, 2-3 times a day. Remove the ice if your skin turns bright red. This is very important. If you cannot feel pain, heat, or cold, you have a greater risk of damage to the area. General instructions Take over-the-counter and prescription medicines only as told by your health care provider. Ask your health care provider if the medicine prescribed to you requires you to avoid driving or using machinery. You may need to take these actions to prevent or treat constipation: Drink enough fluid to keep your urine pale yellow. Take over-the-counter or prescription medicines. Eat foods that are high in fiber, such as beans, whole grains, and fresh fruits and vegetables. Limit foods that are high in fat and processed sugars, such as fried or sweet foods. Keep all follow-up visits. This is important. How is this prevented? Do not sit for longer than 20 minutes at a time. When you sit, choose padded surfaces. Warm up and stretch before being active. Cool down and stretch after being active. Contact a health care provider if: Your pain and stiffness continue or get  worse. Your leg or hip becomes weak. You have changes in your bowel function or bladder function. Summary Piriformis syndrome is a condition that can cause pain, tingling, and numbness in your buttocks and down the back of your leg. You may try applying heat or ice to relieve the pain. Do not sit for long periods. Get up and walk around every 20 minutes or as often as told by your health care provider. This information is not intended to replace advice given to you by your health care provider. Make sure you discuss any questions you have with your health care provider. Document Revised: 04/10/2021 Document Reviewed: 04/10/2021 Elsevier Patient Education  2024 [employer].

## 2024-04-30 NOTE — Progress Notes (Signed)
 Subjective:  Patient ID: Robin Owen, female    DOB: 04-29-1961  Age: 63 y.o. MRN: [id number]  CC:  Chief Complaint  Patient presents with   Medical Management of Chronic Issues    Pt notes doing well just one question GI is concerned her Iron is too high and advised stopping fusion plus pt wants to know PCP thoughts     HPI Robin Owen presents for   Hypertension: With history of OSA on CPAP, NYHA class II CHF and chronic kidney disease.  Spironolactone  25 mg daily, Lasix  40 mg daily, carvedilol  25 mg twice daily, Entresto  24-26 mg and Corlanor  5 mg twice daily.  Unable to tolerate SGLT2 due to mycotic infections.  Followed by cardiology.  Prior Entresto  med adjustment off spironolactone  with previous AKI, elevated creatinine up to 2.28 and hyperkalemia. Not using CPAP - off since December last year - could not tolerate use of device.  Off spirinolactone and corlanor . Still taking lasix , corex, entresto .   Home readings: 120/60 range.  No CP/dyspnea.  No new med side effects.  BP Readings from Last 3 Encounters:  04/30/24 126/60  12/17/23 117/75  12/03/23 108/64   Lab Results  Component Value Date   CREATININE 1.26 (H) 10/31/2023   Diabetes: With CKD.  followed by endocrinology. Treated with Tresiba 20 units/day, Ozempic  -increased at her recent visit to 2 mg weekly.  Option to decrease Tresiba to 18 units if any lower blood sugars. Endocrinology appointment June 10.  Warren Batty, FNP with [facility] endocrinology.  A1c 7.8 at most recent visit June 10th.  Ophthalmology exam: Latter part of 2024, no history of diabetic retinopathy per endocrinology note. Microalbumin: 05/21/2023. Creatinine 1.10 with EGFR of 58 just prior to her recent June 10 endocrine visit.  48-month endocrine follow-up. Home readings around 130-140, no lows;    Lab Results  Component Value Date   LDLCALC 92 06/02/2020   CREATININE 1.26 (H) 10/31/2023    Hypothyroidism: Lab Results  Component Value Date   TSH 1.990 09/16/2015  Synthroid  112 mcg daily, also followed by endocrinology.  Reported recent labs stable at her June 10 visit, continued on 112 mcg. Tsh 0.505 on 03/31/24. Normal vitamin D at that time.   History of iron deficiency, constipation, GERD Recently seen by gastroenterology July 1.  Continue famotidine 40 mg at night indefinitely for GERD, plan for EGD with colonoscopy for surveillance and also secondary to chronic IDA of unknown etiology. MiraLAX recommended for constipation discussed at her July 1 visit, with dosing adjustments based on bowel movements.  She was taking 1 iron tablet per day at that visit, Iron panel noted from July 1 with iron level of 71, transferrin 214, ferritin 185, and 23% transferrin saturation. Was advised to stop taking iron. Has been on anemic side in past - she plans to continue on meds - recommended discussing this with her gastroenterologist.   Hyperlipidemia: Lipitor 20mg  daily.  Lab Results  Component Value Date   CHOL 165 06/02/2020   HDL 51 06/02/2020   LDLCALC 92 06/02/2020   TRIG 111 06/02/2020   CHOLHDL 3.2 06/02/2020   Lab Results  Component Value Date   ALT 36 05/19/2019   AST 26 05/19/2019   ALKPHOS 66 05/19/2019   BILITOT 0.7 05/19/2019      Depression with fibromyalgia Fluoxetine  20 mg daily, gabapentin  600 mg twice daily, Flexeril  10 mg twice daily, tramadol  twice daily. Same doses of meds above.  Some bilateral hip pain, comes and oges in past. More persistent this time - past 2-3 months. NKI, no change in activity.  Pain inside hip areas, no radiation to back/leg.  Tx: meds abvoe, tylenol .       04/30/2024   10:19 AM 10/31/2023   10:14 AM 08/14/2023   11:39 AM 06/20/2023   11:02 AM 04/25/2023   10:35 AM  Depression screen PHQ 2/9  Decreased Interest 1 2 1 1 2   Down, Depressed, Hopeless 2 1 1  0 2  PHQ - 2 Score 3 3 2 1 4   Altered sleeping 0 2 0 1 2  Tired,  decreased energy 2 2 2 1 3   Change in appetite 1 0 0 2 1  Feeling bad or failure about yourself  0 0 0 1 2  Trouble concentrating 1 1 0 1 2  Moving slowly or fidgety/restless 0 0 0 0 0  Suicidal thoughts 0 0 0 0 0  PHQ-9 Score 7 8 4 7 14   Difficult doing work/chores Somewhat difficult  Not difficult at all Not difficult at all    HM: Prevnar 20 today.    History Patient Active Problem List   Diagnosis Date Noted   Pyelonephritis 03/21/2016   Sepsis (HCC) 03/21/2016   Hyponatremia 03/21/2016   Type 2 diabetes mellitus without complication, without long-term current use of insulin  (HCC) 03/21/2016   Normocytic anemia 03/21/2016   Chronic systolic heart failure (HCC) 10/02/2015   Nonischemic cardiomyopathy (HCC)    Pulmonary hypertension (HCC)    Hyperlipidemia 09/14/2015   Essential hypertension 09/14/2015   Fibromyalgia 09/14/2015   Hypothyroidism 09/14/2015   Past Medical History:  Diagnosis Date   Anemia    CHF (congestive heart failure) (HCC)    Depression    Diabetes mellitus without complication (HCC)    Fibromyalgia    Hyperlipidemia    Hypertension    Hypothyroidism    Past Surgical History:  Procedure Laterality Date   CARDIAC CATHETERIZATION N/A 09/16/2015   Procedure: Right/Left Heart Cath and Coronary Angiography;  Surgeon: Peter M [place], MD;  Location: [facility];  Service: Cardiovascular;  Laterality: N/A;   ENDOMETRIAL ABLATION  2012   Allergies  Allergen Reactions   Nitrofurantoin Other (See Comments)    Fever, flu like symtoms   Prior to Admission medications   Medication Sig Start Date End Date Taking? Authorizing Provider  acetaminophen  (TYLENOL ) 325 MG tablet Take 650 mg by mouth every 6 (six) hours as needed for mild pain.   Yes [provider]  aspirin  81 MG EC tablet Adult Low Dose Aspirin  81 mg tablet,delayed release  Take 1 tablet every day by oral route.   Yes [provider]  atorvastatin  (LIPITOR) 20 MG  tablet Take 20 mg by mouth daily. 01/08/20  Yes [provider]  atorvastatin  (LIPITOR) 20 MG tablet Take 1 tablet (20 mg total) by mouth daily. 04/13/24  Yes Rolan Ezra RAMAN, MD  carvedilol  (COREG ) 25 MG tablet TAKE 1 TABLET BY MOUTH TWICE A DAY WITH FOOD 01/15/24  Yes Rolan Ezra RAMAN, MD  cephALEXin  (KEFLEX ) 500 MG capsule Take 1 capsule (500 mg total) by mouth 2 (two) times daily. 04/13/24  Yes Vivienne Delon HERO, PA-C  Cholecalciferol 25 MCG (1000 UT) tablet Take 1,000 Units by mouth daily.   Yes [provider]  Continuous Glucose Sensor (DEXCOM G7 SENSOR) MISC by Does not apply route.   Yes [provider]  cyclobenzaprine  (FLEXERIL ) 10 MG tablet TAKE  1 TABLET (10 MG TOTAL) BY MOUTH IN THE MORNING AND AT BEDTIME 04/28/24  Yes Levora Reyes SAUNDERS, MD  famotidine (PEPCID) 40 MG tablet Take 40 mg by mouth at bedtime. 05/10/20  Yes [provider]  FLUoxetine  (PROZAC ) 20 MG capsule TAKE 1 CAPSULE BY MOUTH EVERY DAY 02/04/24  Yes Levora Reyes SAUNDERS, MD  furosemide  (LASIX ) 40 MG tablet TAKE 1 TABLET BY MOUTH TWICE A DAY 12/30/23  Yes Bensimhon, Toribio SAUNDERS, MD  gabapentin  (NEURONTIN ) 600 MG tablet TAKE 1 TABLET BY MOUTH TWICE A DAY 03/02/24  Yes Tabori, Katherine E, MD  Insulin  Degludec (TRESIBA [place]) Inject 20 Units into the skin daily.   Yes [provider]  Iron-FA-B Cmp-C-Biot-Probiotic (FUSION PLUS) CAPS TAKE 1 CAPSULE BY MOUTH DAILY. TAKE 1 CAPSULE BY MOUTH DAILY. 10/08/23  Yes Levora Reyes SAUNDERS, MD  levothyroxine  (SYNTHROID ) 112 MCG tablet Take 112 mcg by mouth daily before breakfast.   Yes [provider]  Multiple Vitamin (MULTIVITAMIN WITH MINERALS) TABS tablet Take 1 tablet by mouth daily.   Yes [provider]  NOVOTWIST 32G X 5 MM MISC Use as directed with Victozia. 08/19/15  Yes [provider]  ONETOUCH VERIO test strip 1 each by Other route. 04/07/24  Yes [provider]  AISHA SINKS test strip SMARTSIG:1 Each Via  Meter Twice Daily 04/07/24  Yes [provider]  sacubitril -valsartan  (ENTRESTO ) 24-26 MG TAKE 1 TABLET BY MOUTH TWICE A DAY 04/13/24  Yes Rolan Ezra RAMAN, MD  Semaglutide (OZEMPIC, 1 MG/DOSE, [place]) Inject 1 mg into the skin once a week.   Yes [provider]  traMADol  (ULTRAM ) 50 MG tablet TAKE 1 TABLET (50 MG TOTAL) BY MOUTH EVERY 6 (SIX) HOURS AS NEEDED FOR MODERATE PAIN 04/13/24  Yes Levora Reyes SAUNDERS, MD  Urine Glucose-Ketones Test [facility]) STRP  09/01/17  Yes [provider]   Social History   Socioeconomic History   Marital status: Married    Spouse name: Not on file   Number of children: Not on file   Years of education: Not on file   Highest education level: Not on file  Occupational History   Not on file  Tobacco Use   Smoking status: Never   Smokeless tobacco: Never  Substance and Sexual Activity   Alcohol use: No   Drug use: No   Sexual activity: Yes    Birth control/protection: None  Other Topics Concern   Not on file  Social History Narrative   Not on file   Social Drivers of Health   Financial Resource Strain: Low Risk  (08/14/2023)   Overall Financial Resource Strain (CARDIA)    Difficulty of Paying Living Expenses: Not hard at all  Food Insecurity: No Food Insecurity (08/14/2023)   Hunger Vital Sign    Worried About Running Out of Food in the Last Year: Never true    Ran Out of Food in the Last Year: Never true  Transportation Needs: No Transportation Needs (08/14/2023)   PRAPARE - Administrator, Civil Service (Medical): No    Lack of Transportation (Non-Medical): No  Physical Activity: Insufficiently Active (08/14/2023)   Exercise Vital Sign    Days of Exercise per Week: 3 days    Minutes of Exercise per Session: 30 min  Stress: No Stress Concern Present (08/14/2023)   [employer] of Occupational Health - Occupational Stress Questionnaire    Feeling of Stress : Not at all  Social Connections: Moderately  Integrated (08/14/2023)  Social Connection and Isolation Panel    Frequency of Communication with Friends and Family: More than three times a week    Frequency of Social Gatherings with Friends and Family: Never    Attends Religious Services: More than 4 times per year    Active Member of [employer] or Organizations: No    Attends Banker Meetings: Never    Marital Status: Married  Catering manager Violence: Not At Risk (08/14/2023)   Humiliation, Afraid, Rape, and Kick questionnaire    Fear of Current or Ex-Partner: No    Emotionally Abused: No    Physically Abused: No    Sexually Abused: No    Review of Systems  Per HPI Objective:   Vitals:   04/30/24 1012  BP: 126/60  Pulse: 85  Resp: 17  Temp: 98.7 F (37.1 C)  TempSrc: Temporal  SpO2: 98%  Weight: 164 lb 12.8 oz (74.8 kg)  Height: 5' 6 (1.676 m)     Physical Exam Vitals reviewed.  Constitutional:      Appearance: Normal appearance. She is well-developed.  HENT:     Head: Normocephalic and atraumatic.  Eyes:     Conjunctiva/sclera: Conjunctivae normal.     Pupils: Pupils are equal, round, and reactive to light.  Neck:     Vascular: No carotid bruit.  Cardiovascular:     Rate and Rhythm: Normal rate and regular rhythm.     Heart sounds: Normal heart sounds.  Pulmonary:     Effort: Pulmonary effort is normal.     Breath sounds: Normal breath sounds.  Abdominal:     Palpations: Abdomen is soft. There is no pulsatile mass.     Tenderness: There is no abdominal tenderness.  Musculoskeletal:     Right lower leg: No edema.     Left lower leg: No edema.     Comments: Bilateral hip exam, pain-free range of motion, no bony tenderness, trochanteric bursa nontender.  Lumbar spine, and SI joint nontender.  Reports area discomfort inferior to SI joint, and more lateral bilaterally, pain reproduced with piriformis stretch.  Discomfort in same area.  Skin:    General: Skin is warm and dry.  Neurological:      Mental Status: She is alert and oriented to person, place, and time.  Psychiatric:        Mood and Affect: Mood normal.        Behavior: Behavior normal.     Assessment & Plan:  Robin Owen is a 63 y.o. female . Fibromyalgia - Stable with current med regimen, will refill when meds due.  Type 2 diabetes mellitus with hyperglycemia, with long-term current use of insulin  (HCC)  - Continue follow-up with endocrinology, no med changes from me at this time.  Need for vaccination against Streptococcus pneumoniae - Plan: Pneumococcal conjugate vaccine 20-valent (Prevnar 20)  Hypothyroidism, unspecified type  - Also followed by endocrinology, continue follow-up as planned.  Essential hypertension Chronic systolic heart failure (HCC) Stage 3a chronic kidney disease (HCC)  - Appears euvolemic, blood pressure stable in office, no med changes.  Previous medication adjustments with cardiology and appears to be stable at this time.  Hyperlipidemia, unspecified hyperlipidemia type - Plan: Lipid panel, Comprehensive metabolic panel with GFR  - Check labs and adjust plan accordingly.  Continue Lipitor for now.  Piriformis syndrome of both sides  - Pain appears to be more piriformis syndrome than true lumbar spine source or hips given reassuring exam.  We discussed piriformis stretches,  option of physical therapy or orthopedic evaluation if not improving.  RTC precautions given.  No orders of the defined types were placed in this encounter.  Patient Instructions  Recent labs were reviewed.  Thank you for coming in today. No change in medications at this time. I recommend discussing the iron question with gastroenterology, but recent labs look ok to me.   Hip pain appears to be piriformis syndrome. Try the stretches we discussed a few times per day and let me know if that is not improving in next few weeks.   Piriformis Syndrome  Piriformis syndrome is a condition that can cause pain and  numbness in your buttocks and down the back of your leg. Piriformis syndrome happens when the small muscle that connects the base of your spine to your hip (piriformis muscle) presses on the nerve that runs down the back of your leg (sciatic nerve). The piriformis muscle helps your hip rotate and helps to bring your leg back and out. It also helps shift your weight to keep you stable while you are walking. The sciatic nerve runs under or through the piriformis muscle. Damage to the piriformis muscle can cause spasms that put pressure on the nerve below. This causes pain and discomfort while sitting and moving. The pain may feel as if it begins in the buttock and spreads (radiates) down your hip and thigh. What are the causes? This condition is caused by pressure on the sciatic nerve from the piriformis muscle. The piriformis muscle can get irritated with overuse, especially if other hip muscles are weak and the piriformis muscle has to do extra work. Piriformis syndrome can also occur after an injury, like a fall onto your buttocks. What increases the risk? You are more likely to develop this condition if you: Are a woman. Sit for long periods of time. Are a cyclist. Have weak buttocks muscles (gluteal muscles). What are the signs or symptoms? Symptoms of this condition include: Pain, tingling, or numbness that starts in the buttock and runs down the back of your leg (sciatica). Pain in the groin or thigh area. Your symptoms may get worse: The longer you sit. When you walk, run, or climb stairs. When straining to have a bowel movement. How is this diagnosed? This condition is diagnosed based on your symptoms, medical history, and physical exam. During the exam, your health care provider may: Move your leg into different positions to check for pain. Press on the muscles of your hip and buttock to see if that increases your symptoms. You may also have tests, including: Imaging tests such as  X-rays, CT, MRI, or ultrasound. Electromyogram (EMG). This test measures electrical signals sent by your nerves into the muscles. Nerve conduction study. This test measures how well electrical signals pass through your nerves. How is this treated? This condition may be treated by: Stopping all activities that cause pain or make your condition worse. Applying ice or using heat therapy. Taking medicines to reduce pain and swelling. Taking a muscle relaxer (muscle relaxant) to stop muscle spasms. Doing range-of-motion and strengthening exercises (physical therapy) as told by your health care provider. Having massage, acupuncture, or local electrical stimulation (transcutaneous electrical nerve stimulation, TENS). Getting an injection of medicine in the piriformis muscle. Your health care provider will choose the medicine based on your condition. He or she may inject: An anti-inflammatory medicine (steroid) to reduce swelling. A numbing medicine (local anesthetic) to block the pain. Botulinum toxin. The toxin blocks nerve impulses to  specific muscles to reduce muscle tension. In rare cases, you may need surgery to cut the muscle and release pressure on the nerve if other treatments do not work. Follow these instructions at home: Activity Do not sit for long periods. Get up and walk around every 20 minutes or as often as told by your health care provider. When driving long distances, make sure to take frequent stops to get up and stretch. Use a cushion when you sit on hard surfaces. Do exercises as told by your health care provider. Return to your normal activities as told by your health care provider. Ask your health care provider what activities are safe for you. Managing pain, stiffness, and swelling     If directed, apply heat to the area as often as told by your health care provider. Use the heat source that your health care provider recommends, such as a moist heat pack or a heating  pad. Place a towel between your skin and the heat source. Leave the heat on for 20-30 minutes. Remove the heat if your skin turns bright red. This is especially important if you are unable to feel pain, heat, or cold. You have a greater risk of getting burned. If directed, put ice on the injured area. To do this: Put ice in a plastic bag. Place a towel between your skin and the bag. Leave the ice on for 20 minutes, 2-3 times a day. Remove the ice if your skin turns bright red. This is very important. If you cannot feel pain, heat, or cold, you have a greater risk of damage to the area. General instructions Take over-the-counter and prescription medicines only as told by your health care provider. Ask your health care provider if the medicine prescribed to you requires you to avoid driving or using machinery. You may need to take these actions to prevent or treat constipation: Drink enough fluid to keep your urine pale yellow. Take over-the-counter or prescription medicines. Eat foods that are high in fiber, such as beans, whole grains, and fresh fruits and vegetables. Limit foods that are high in fat and processed sugars, such as fried or sweet foods. Keep all follow-up visits. This is important. How is this prevented? Do not sit for longer than 20 minutes at a time. When you sit, choose padded surfaces. Warm up and stretch before being active. Cool down and stretch after being active. Contact a health care provider if: Your pain and stiffness continue or get worse. Your leg or hip becomes weak. You have changes in your bowel function or bladder function. Summary Piriformis syndrome is a condition that can cause pain, tingling, and numbness in your buttocks and down the back of your leg. You may try applying heat or ice to relieve the pain. Do not sit for long periods. Get up and walk around every 20 minutes or as often as told by your health care provider. This information is not  intended to replace advice given to you by your health care provider. Make sure you discuss any questions you have with your health care provider. Document Revised: 04/10/2021 Document Reviewed: 04/10/2021 Elsevier Patient Education  2024 Elsevier Inc.       Signed,   Reyes Pines, MD [facility], [facility] 04/30/24 10:59 AM

## 2024-05-01 ENCOUNTER — Encounter: Payer: Self-pay | Admitting: Family Medicine

## 2024-05-05 ENCOUNTER — Ambulatory Visit: Payer: Self-pay | Admitting: Family Medicine

## 2024-05-19 ENCOUNTER — Encounter: Payer: Self-pay | Admitting: Pharmacist

## 2024-05-19 NOTE — Progress Notes (Unsigned)
 Pharmacy Quality Measure Review  This patient is appearing on a report for being at risk of failing the adherence measure for cholesterol (statin) medications this calendar year.   Medication: atorvastatin  40mg  Last fill date: 12/20/2023 for 90 day supply per adherence report  Reviewed recent refill history - patient filled atorvastaitn 20mg  on 04/13/2024 for 90 day supply.   Lab Results  Component Value Date   CHOL 140 04/30/2024   HDL 46.60 04/30/2024   LDLCALC 67 04/30/2024   TRIG 135.0 04/30/2024   CHOLHDL 3 04/30/2024     Insurance report was not up to date. No action needed at this time.  Will continue to follow in 2025.   Madelin Ray, PharmD Clinical Pharmacist [facility]  Population Health [phone]

## 2024-05-27 ENCOUNTER — Telehealth (HOSPITAL_COMMUNITY): Payer: Self-pay

## 2024-05-27 NOTE — Telephone Encounter (Signed)
 Received a fax requesting medical records from [employer]-for [employer]. Records were successfully mailed to [facility] [place] [address] [place], [place] [address] their number: [phone] ,which was the number provided.. Medical request form will be scanned into patients chart.

## 2024-06-08 ENCOUNTER — Ambulatory Visit (HOSPITAL_COMMUNITY)
Admission: RE | Admit: 2024-06-08 | Discharge: 2024-06-08 | Disposition: A | Discharge status: Home / Self Care | Source: Ambulatory Visit | Attending: Family Medicine | Admitting: Family Medicine

## 2024-06-08 ENCOUNTER — Ambulatory Visit (HOSPITAL_COMMUNITY): Payer: Self-pay | Admitting: Cardiology

## 2024-06-08 ENCOUNTER — Encounter (HOSPITAL_COMMUNITY): Payer: Self-pay | Admitting: Cardiology

## 2024-06-08 ENCOUNTER — Ambulatory Visit (HOSPITAL_COMMUNITY)
Admission: RE | Admit: 2024-06-08 | Discharge: 2024-06-08 | Disposition: A | Discharge status: Home / Self Care | Source: Ambulatory Visit | Attending: Cardiology | Admitting: Cardiology

## 2024-06-08 VITALS — BP 106/70 | HR 85 | Wt 163.8 lb

## 2024-06-08 DIAGNOSIS — E039 Hypothyroidism, unspecified: Secondary | ICD-10-CM | POA: Diagnosis not present

## 2024-06-08 DIAGNOSIS — M797 Fibromyalgia: Secondary | ICD-10-CM | POA: Insufficient documentation

## 2024-06-08 DIAGNOSIS — Z79899 Other long term (current) drug therapy: Secondary | ICD-10-CM | POA: Insufficient documentation

## 2024-06-08 DIAGNOSIS — Z7985 Long-term (current) use of injectable non-insulin antidiabetic drugs: Secondary | ICD-10-CM | POA: Diagnosis not present

## 2024-06-08 DIAGNOSIS — I5022 Chronic systolic (congestive) heart failure: Secondary | ICD-10-CM | POA: Diagnosis not present

## 2024-06-08 DIAGNOSIS — E119 Type 2 diabetes mellitus without complications: Secondary | ICD-10-CM | POA: Insufficient documentation

## 2024-06-08 DIAGNOSIS — Z79890 Hormone replacement therapy (postmenopausal): Secondary | ICD-10-CM | POA: Insufficient documentation

## 2024-06-08 DIAGNOSIS — G4733 Obstructive sleep apnea (adult) (pediatric): Secondary | ICD-10-CM | POA: Insufficient documentation

## 2024-06-08 DIAGNOSIS — E785 Hyperlipidemia, unspecified: Secondary | ICD-10-CM | POA: Diagnosis not present

## 2024-06-08 DIAGNOSIS — Z794 Long term (current) use of insulin: Secondary | ICD-10-CM | POA: Insufficient documentation

## 2024-06-08 DIAGNOSIS — I110 Hypertensive heart disease with heart failure: Secondary | ICD-10-CM | POA: Diagnosis not present

## 2024-06-08 DIAGNOSIS — I428 Other cardiomyopathies: Secondary | ICD-10-CM | POA: Diagnosis not present

## 2024-06-08 LAB — BASIC METABOLIC PANEL WITH GFR
Anion gap: 10 (ref 5–15)
BUN: 18 mg/dL (ref 6–20)
CO2: 30 mmol/L (ref 22–32)
Calcium: 9.5 mg/dL (ref 8.9–10.3)
Chloride: 99 mmol/L (ref 98–111)
Creatinine, Ser: 0.98 mg/dL (ref 0.44–1.00)
GFR, Estimated: >60 mL/min (ref >60)
Glucose, Bld: 111 mg/dL — ABNORMAL HIGH (ref 70–99)
Potassium: 4.3 mmol/L (ref 3.5–5.1)
Sodium: 139 mmol/L (ref 135–145)

## 2024-06-08 LAB — BRAIN NATRIURETIC PEPTIDE: B Natriuretic Peptide: 42 pg/mL (ref 0.0–100.0)

## 2024-06-08 MED ORDER — SPIRONOLACTONE 25 MG PO TABS: 25.0000 mg | ORAL_TABLET | Freq: Every day | ORAL | 3 refills | Status: AC

## 2024-06-08 NOTE — Patient Instructions (Signed)
 Medication Changes:  START Spironolactone  12.5 mg (1/2 tab) Daily  Lab Work:  Labs done today, your results will be available in MyChart, we will contact you for abnormal readings.  Your physician recommends that you return for lab work in: 1-2 weeks  Testing/Procedures:  Your physician has requested that you have a cardiac MRI. Cardiac MRI uses a computer to create images of your heart as its beating, producing both still and moving pictures of your heart and major blood vessels. YOU WILL BE CALLED TO SCHEDULE THIS, PLEASE SEE INSTRUCTIONS BELOW.  Special Instructions // Education:  Do the following things EVERYDAY: Weigh yourself in the morning before breakfast. Write it down and keep it in a log. Take your medicines as prescribed Eat low salt foods--Limit salt (sodium) to 2000 mg per day.  Stay as active as you can everyday Limit all fluids for the day to less than 2 liters   Follow-Up in: 2 months   At the [facility], you and your health needs are our priority. We have a designated team specialized in the treatment of Heart Failure. This Care Team includes your primary Heart Failure Specialized Cardiologist (physician), Advanced Practice Providers (APPs- Physician Assistants and Nurse Practitioners), and Pharmacist who all work together to provide you with the care you need, when you need it.   You may see any of the following providers on your designated Care Team at your next follow up:  Dr. Toribio Fuel Dr. Ezra Shuck Dr. Ria Commander Dr. Odis Brownie Greig Mosses, NP Caffie Shed, [place] [facility] [place], [place] Beckey Coe, NP [place] Lee, NP Tinnie Redman, PharmD   Please be sure to bring in all your medications bottles to every appointment.   Need to Contact Us :  If you have any questions or concerns before your next appointment please send us  a message through [place] or call our office at [phone].    TO LEAVE A  MESSAGE FOR THE NURSE SELECT OPTION 2, PLEASE LEAVE A MESSAGE INCLUDING: YOUR NAME DATE OF BIRTH CALL BACK NUMBER REASON FOR CALL**this is important as we prioritize the call backs  YOU WILL RECEIVE A CALL BACK THE SAME DAY AS LONG AS YOU CALL BEFORE 4:00 PM

## 2024-06-08 NOTE — Progress Notes (Signed)
 Date:  06/08/2024   ID:  Robin Owen, DOB Feb 01, 1961, MRN [id number]   Provider location: [facility] Type of Visit: Established patient  PCP:  Levora Reyes SAUNDERS, MD  HF Cardiologist:  Dr. Rolan  Chief complaint: CHF   HPI: [name] is a 63 y.o. female who has a past medical history of HTN, HLD, DMII, hypothyroidism and fibromyalgia (on disability), and CHF.    Admitted 11/16 through 09/17/15 with increased dyspnea. CXR with pulmonary edema. This prompted and ECHO that showed reduced EF 20%. RHC/LHC showed normal coronaries and reduced cardiac index. Cardiac MRI showed some mid-wall late gadolinium enhancement in the septum.  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 Started on coreg  and lasix . Continued on lisinopril . Discharge weight was 163 pounds.    She developed AKI and hyperkalemia in the setting of high dose Ibuprofen.  She was instructed to stop this and she was started on tramadol  as needed instead.     Echo in 11/18 showed increase in EF to 50-55%.       Echo 10/24/18 LVEF 45-50%, Grade 1 DD, Mild MR, PA peak pressure 24 mm Hg. Echo in 8/21 showed EF 50-55%, basal inferior hypokinesis, normal RV.  Echo in 11/22 showed EF 50-55%, normal RV, normal IVC.   Echo 8/24 EF 55-60%. RV normal.    Echo was done today and reviewed, EF 45%, normal RV size/systolic function, no significant valvular abnormalities.   Patient returns for followup of CHF.  She gets short of breath walking up stairs and inclines, this is stable. No dyspnea walking on flat ground.  No chest pain.  No orthopnea/PND.  No lightheadedness.  Weight stable.  She has been off spironolactone  for a while now.   ECG (personally reviewed): NSR, poor RWP, nonspecific T wave flattening  Labs 1/17: SPEP negative Labs 3/22: K 4.7, creatinine 1.18, LDL 88, HDL 45 Labs 9/22: LDL 75, K 5, creatinine 1.28 Labs 1/23: LDL 74, K 5.3, creatinine 1.4 Labs 12/23: K 4.2, creatinine 1.26 Labs (05/21/23): K 6.3,  creatinine 2.28 Labs (05/28/23): K 4.1, creatinine 1.19 Labs (8/24): K 4.1, creatinine 1.23  Labs (1/25): K 4.9, creatinine 1.26 Labs (7/25): K 4.6, creatinine 1.0, LDL 67  Cardiac Studies Echo 11/16: EF 20% MV mild regurgitation TV mild regurgitation cMRI 11/16: LVEF 20-25%, RV moderately down, moderate R and L atrial dilatation, mild to moderate MR RHC/LHC 11/16: RA 7, PCWP 29, CO/CI 3.11/1.7, normal cors Echo (5/17) with EF 35-40%.  Echo (12/17) with EF 35-40%, mild LV dilation.  CPX (12/17) with RER 1.13, peak VO2 20.8, VE/VCO2 slope 30. Echo (11/18): EF 50-55%. Echo (12/19): LVEF 45-50%, Grade 1 DD, Mild MR, PA peak pressure 24 mm Hg Echo (8/21): EF 50-55%, basal inferior hypokinesis, normal RV size and systolic function.  Echo (11/22): EF 50-55%, normal RV, normal IVC Echo (8/23): EF 55-60%, RV normal  Echo (8/24): EF 55-60%, RV normal. Echo (8/25): EF 45%, normal RV size/systolic function, no significant valvular abnormalities.  Zio monitor (3/25): Rare PVCs   Past Medical History:  Diagnosis Date   Anemia    CHF (congestive heart failure) (HCC)    Depression    Diabetes mellitus without complication (HCC)    Fibromyalgia    Hyperlipidemia    Hypertension    Hypothyroidism    Current Outpatient Medications  Medication Sig Dispense Refill   acetaminophen  (TYLENOL ) 325 MG tablet Take 650 mg by mouth every 6 (six) hours as needed for mild  pain.     aspirin  81 MG EC tablet Adult Low Dose Aspirin  81 mg tablet,delayed release  Take 1 tablet every day by oral route.     atorvastatin  (LIPITOR) 20 MG tablet Take 20 mg by mouth daily.     carvedilol  (COREG ) 25 MG tablet TAKE 1 TABLET BY MOUTH TWICE A DAY WITH FOOD 180 tablet 3   Cholecalciferol 25 MCG (1000 UT) tablet Take 1,000 Units by mouth daily.     Continuous Glucose Sensor (DEXCOM G7 SENSOR) MISC by Does not apply route.     cyclobenzaprine  (FLEXERIL ) 10 MG tablet TAKE 1 TABLET (10 MG TOTAL) BY MOUTH IN THE MORNING AND AT  BEDTIME 60 tablet 1   famotidine (PEPCID) 40 MG tablet Take 40 mg by mouth at bedtime.     FLUoxetine  (PROZAC ) 20 MG capsule TAKE 1 CAPSULE BY MOUTH EVERY DAY 90 capsule 1   furosemide  (LASIX ) 40 MG tablet TAKE 1 TABLET BY MOUTH TWICE A DAY 180 tablet 3   gabapentin  (NEURONTIN ) 600 MG tablet TAKE 1 TABLET BY MOUTH TWICE A DAY 180 tablet 1   Insulin  Degludec (TRESIBA [place]) Inject 20 Units into the skin daily.     Iron-FA-B Cmp-C-Biot-Probiotic (FUSION PLUS) CAPS TAKE 1 CAPSULE BY MOUTH DAILY. TAKE 1 CAPSULE BY MOUTH DAILY. 90 capsule 3   levothyroxine  (SYNTHROID ) 112 MCG tablet Take 112 mcg by mouth daily before breakfast.     Multiple Vitamin (MULTIVITAMIN WITH MINERALS) TABS tablet Take 1 tablet by mouth daily.     NOVOTWIST 32G X 5 MM MISC Use as directed with Victozia.  2   ONETOUCH VERIO test strip 1 each by Other route.     ONETOUCH VERIO test strip SMARTSIG:1 Each Via Meter Twice Daily     sacubitril -valsartan  (ENTRESTO ) 24-26 MG TAKE 1 TABLET BY MOUTH TWICE A DAY 180 tablet 3   Semaglutide, 2 MG/DOSE, (OZEMPIC, 2 MG/DOSE,) 8 MG/3ML SOPN Inject 2 mg into the skin once a week.     spironolactone  (ALDACTONE ) 25 MG tablet Take 1 tablet (25 mg total) by mouth daily. 90 tablet 3   traMADol  (ULTRAM ) 50 MG tablet TAKE 1 TABLET (50 MG TOTAL) BY MOUTH EVERY 6 (SIX) HOURS AS NEEDED FOR MODERATE PAIN 180 tablet 0   Urine Glucose-Ketones Test (KETO-DIASTIX) STRP      No current facility-administered medications for this encounter.   Allergies:   Nitrofurantoin   Social History:  The patient  reports that she has never smoked. She has never used smokeless tobacco. She reports that she does not drink alcohol and does not use drugs.   Family History:  The patient's family history includes Atrial fibrillation in her maternal grandmother and mother; Diabetes Mellitus II in her brother and father; Hypertension in her brother, father, maternal grandmother, and mother; Kidney disease in her father; Pulmonary  fibrosis in her mother; Stroke in her maternal grandmother.   ROS:  Please see the history of present illness.   All other systems are personally reviewed and negative.   Wt Readings from Last 3 Encounters:  06/08/24 74.3 kg (163 lb 12.8 oz)  04/30/24 74.8 kg (164 lb 12.8 oz)  12/17/23 73.5 kg (162 lb)   BP 106/70   Pulse 85   Wt 74.3 kg (163 lb 12.8 oz)   SpO2 98%   BMI 26.44 kg/m   Physical Exam:   General: NAD Neck: No JVD, no thyromegaly or thyroid  nodule.  Lungs: Clear to auscultation bilaterally with normal respiratory effort.  CV: Nondisplaced PMI.  Heart regular S1/S2, no S3/S4, no murmur.  No peripheral edema.  No carotid bruit.  Normal pedal pulses.  Abdomen: Soft, nontender, no hepatosplenomegaly, no distention.  Skin: Intact without lesions or rashes.  Neurologic: Alert and oriented x 3.  Psych: Normal affect. Extremities: No clubbing or cyanosis.  HEENT: Normal.   Assessment & Plan: 1. Chronic systolic CHF: Echo 11/16 showed EF 20%. cMRI EF 20-25%, RV mod dilated.  cMRI (11/16) showed an area of mid-wall septal late gadolinium enhancement. NICM perhaps related to HTN versus viral myocarditis. SPEP negative.  09/16/2015 RHC/LHC coronaries ok, low cardiac index 1.7.  Echo  5/17 and again in 12/17 showed some improvement, EF 35-40%.  CPX in 12/17 showed low normal peak VO2 with no clear cardiopulmonary limitation. Echo in 11/18 showed EF up to 50-55%, Echo in 12/19 showed EF 45-50%, echo in 8/21 showed EF 50-55%.  Echo in 11/22 was stable with EF 50-55%. Echo 8/23 EF 55-60%, RV normal. Echo 8/24 EF 55-60%, RV normal.  Today's echo was reviewed, EF lower at 45%.  Symptomatically no changes.  NYHA class II, not volume overloaded on exam, she is in NSR.   - Continue Lasix  40 mg bid. BMET/BNP today.  - Continue Coreg  25 mg bid. - Start spironolactone  12.5 mg daily. BMET in 10 days.  - Continue Entresto  24-26 mg bid. - off Jardiance due to recurrent yeast infections and UTIs  -  With fall in EF, I will arrange for cardiac MRI to assess for infiltrative disease/etc.  2. Diabetes: Off empagliflozin due to recurrent yeast infections.  3. OSA: Continue CPAP nightly.  4. Hyperlipidemia: Continue statin. Good LDL in 7/25.  5. Fibromyalgia: Disabled since 1996.   Followup in 2 months with APP.   I spent 31 minutes reviewing records, interviewing/examining patient, and managing orders.   Signed, Ezra Shuck, MD  06/08/2024  [facility] [address] [facility] [place] [address] [phone] (office) [phone] (fax)

## 2024-06-18 ENCOUNTER — Ambulatory Visit (HOSPITAL_COMMUNITY)
Admission: RE | Admit: 2024-06-18 | Discharge: 2024-06-18 | Disposition: A | Discharge status: Home / Self Care | Source: Ambulatory Visit | Attending: Cardiology | Admitting: Cardiology

## 2024-06-18 DIAGNOSIS — I5022 Chronic systolic (congestive) heart failure: Secondary | ICD-10-CM | POA: Insufficient documentation

## 2024-06-18 LAB — BASIC METABOLIC PANEL WITH GFR
Anion gap: 12 (ref 5–15)
BUN: 19 mg/dL (ref 6–20)
CO2: 31 mmol/L (ref 22–32)
Calcium: 9.6 mg/dL (ref 8.9–10.3)
Chloride: 97 mmol/L — ABNORMAL LOW (ref 98–111)
Creatinine, Ser: 1.24 mg/dL — ABNORMAL HIGH (ref 0.44–1.00)
GFR, Estimated: 50 mL/min — ABNORMAL LOW (ref >60)
Glucose, Bld: 160 mg/dL — ABNORMAL HIGH (ref 70–99)
Potassium: 4.6 mmol/L (ref 3.5–5.1)
Sodium: 140 mmol/L (ref 135–145)

## 2024-06-20 ENCOUNTER — Other Ambulatory Visit: Payer: Self-pay | Admitting: Family Medicine

## 2024-06-20 DIAGNOSIS — M797 Fibromyalgia: Secondary | ICD-10-CM

## 2024-07-02 ENCOUNTER — Other Ambulatory Visit (HOSPITAL_COMMUNITY): Payer: Self-pay | Admitting: Cardiology

## 2024-07-10 ENCOUNTER — Other Ambulatory Visit: Payer: Self-pay | Admitting: Family Medicine

## 2024-07-10 DIAGNOSIS — M797 Fibromyalgia: Secondary | ICD-10-CM

## 2024-07-10 NOTE — Telephone Encounter (Signed)
 Requested Prescriptions   Pending Prescriptions Disp Refills   traMADol  (ULTRAM ) 50 MG tablet [Pharmacy Med Name: TRAMADOL  HCL 50 MG TABLET] 180 tablet 0    Sig: TAKE 1 TABLET (50 MG TOTAL) BY MOUTH EVERY 6 (SIX) HOURS AS NEEDED FOR MODERATE PAIN     Date of patient request: 07/10/2024 Last office visit: 04/30/2024 Upcoming visit: 10/01/2024 Date of last refill: 04/13/2024 Last refill amount: 180

## 2024-07-10 NOTE — Telephone Encounter (Signed)
 Fibromyalgia discussed in July.  Tramadol  twice daily.  Controlled substance database reviewed.  Last prescription for #180 on 04/13/2024.  Previous prescriptions consistent.  Refill ordered.

## 2024-07-16 ENCOUNTER — Telehealth (HOSPITAL_COMMUNITY): Payer: Self-pay

## 2024-07-18 ENCOUNTER — Telehealth: Admitting: Nurse Practitioner

## 2024-07-18 DIAGNOSIS — R2241 Localized swelling, mass and lump, right lower limb: Secondary | ICD-10-CM

## 2024-07-18 MED ORDER — PREDNISONE 20 MG PO TABS: 20.0000 mg | ORAL_TABLET | Freq: Every day | ORAL | 0 refills | Status: AC

## 2024-07-18 NOTE — Progress Notes (Signed)

## 2024-07-18 NOTE — Progress Notes (Signed)
 I have spent 5 minutes in review of e-visit questionnaire, review and updating patient chart, medical decision making and response to patient.   Claiborne Rigg, NP

## 2024-07-22 ENCOUNTER — Encounter (HOSPITAL_COMMUNITY): Payer: Self-pay

## 2024-07-22 ENCOUNTER — Ambulatory Visit (HOSPITAL_COMMUNITY): Payer: Self-pay | Admitting: Family Medicine

## 2024-07-22 ENCOUNTER — Encounter (HOSPITAL_COMMUNITY): Payer: Self-pay | Admitting: Cardiology

## 2024-07-22 LAB — ECHOCARDIOGRAM COMPLETE
Area-P 1/2: 6.27 cm2
Calc EF: 50.9 %
S' Lateral: 3.8 cm
Single Plane A2C EF: 55.3 %
Single Plane A4C EF: 45.4 %

## 2024-07-24 ENCOUNTER — Ambulatory Visit (HOSPITAL_COMMUNITY)
Admission: RE | Admit: 2024-07-24 | Discharge: 2024-07-24 | Disposition: A | Discharge status: Home / Self Care | Source: Ambulatory Visit | Attending: Cardiology | Admitting: Cardiology

## 2024-07-24 ENCOUNTER — Other Ambulatory Visit (HOSPITAL_COMMUNITY): Payer: Self-pay | Admitting: Cardiology

## 2024-07-24 DIAGNOSIS — I5022 Chronic systolic (congestive) heart failure: Secondary | ICD-10-CM

## 2024-07-24 MED ORDER — GADOBUTROL 1 MMOL/ML IV SOLN
10.0000 mL | Freq: Once | INTRAVENOUS | Status: AC | PRN
  Administered 2024-07-24: 10 mL via INTRAVENOUS

## 2024-07-30 DIAGNOSIS — Z1211 Encounter for screening for malignant neoplasm of colon: Secondary | ICD-10-CM | POA: Diagnosis not present

## 2024-07-30 DIAGNOSIS — Z1212 Encounter for screening for malignant neoplasm of rectum: Secondary | ICD-10-CM | POA: Diagnosis not present

## 2024-08-03 LAB — COLOGUARD: COLOGUARD: NEGATIVE

## 2024-08-04 ENCOUNTER — Encounter: Payer: Self-pay | Admitting: Pharmacist

## 2024-08-04 ENCOUNTER — Encounter (HOSPITAL_COMMUNITY)

## 2024-08-04 NOTE — Progress Notes (Signed)
 Pharmacy Quality Measure Review  This patient is appearing on a report for being at risk of failing the adherence measure for cholesterol (statin) medications this calendar year.   Medication: atorvastatin  40mg  Last fill date: 04/13/2024 for 90 day supply per adherence report  Reviewed recent refill history - patient filled atorvastatin  20mg  on 07/10/2024 for 90 day supply. Rx has 2 refills remaining. Sees Dr Levora again 10/01/2024.   Lab Results  Component Value Date   CHOL 140 04/30/2024   HDL 46.60 04/30/2024   LDLCALC 67 04/30/2024   TRIG 135.0 04/30/2024   CHOLHDL 3 04/30/2024     Insurance report was not up to date. No action needed at this time.    Madelin Ray, PharmD Clinical Pharmacist [facility]  Population Health [phone]

## 2024-08-05 NOTE — Progress Notes (Signed)
 [facility]  Date:  08/11/2024   ID:  Robin Owen, DOB 1961/08/07, MRN [id number]   Provider location: [facility] Type of Visit: Established patient  PCP:  Levora Reyes SAUNDERS, MD  HF Cardiologist:  Dr. Rolan   HPI: Robin Owen is a 63 y.o. female who has a past medical history of HTN, HLD, DMII, hypothyroidism and fibromyalgia (on disability), and CHF.    Admitted 11/16 through 09/17/15 with increased dyspnea. CXR with pulmonary edema. This prompted and ECHO that showed reduced EF 20%. RHC/LHC showed normal coronaries and reduced cardiac index. Cardiac MRI showed some mid-wall late gadolinium enhancement in the septum.  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 Started on coreg  and lasix . Continued on lisinopril . Discharge weight was 163 pounds.    She developed AKI and hyperkalemia in the setting of high dose Ibuprofen.  She was instructed to stop this and she was started on tramadol  as needed instead.     Echo in 11/18 showed increase in EF to 50-55%.       Echo 10/24/18 LVEF 45-50%, Grade 1 DD, Mild MR, PA peak pressure 24 mm Hg. Echo in 8/21 showed EF 50-55%, basal inferior hypokinesis, normal RV.  Echo in 11/22 showed EF 50-55%, normal RV, normal IVC.   Echo 8/24 EF 55-60%. RV normal.    Echo 8/25 showed EF 45%, normal RV size/systolic function, no significant valvular abnormalities.   cMRI 9/25 showed LVEF 43%, RVEF normal, non-specific LGE in basal septum.  Today she returns for HF follow up. Overall feeling fine. She has SOB walking up inclines. Denies palpitations, abnormal bleeding, CP, dizziness, edema, or PND/Orthopnea. Appetite ok. Weight at home 158 pounds. Taking all medications. Not wearing CPAP.  ECG (personally reviewed): none ordered today.  Labs 1/17: SPEP negative Labs (7/24): K 4.1, creatinine 1.19 Labs (8/24): K 4.1, creatinine 1.23  Labs (1/25): K 4.9, creatinine 1.26 Labs (7/25): K 4.6, creatinine 1.0, LDL 67  Cardiac  Studies Echo 11/16: EF 20% MV mild regurgitation TV mild regurgitation cMRI 11/16: LVEF 20-25%, RV moderately down, moderate R and L atrial dilatation, mild to moderate MR RHC/LHC 11/16: RA 7, PCWP 29, CO/CI 3.11/1.7, normal cors Echo (5/17) with EF 35-40%.  Echo (12/17) with EF 35-40%, mild LV dilation.  CPX (12/17) with RER 1.13, peak VO2 20.8, VE/VCO2 slope 30. Echo (11/18): EF 50-55%. Echo (12/19): LVEF 45-50%, Grade 1 DD, Mild MR, PA peak pressure 24 mm Hg Echo (8/21): EF 50-55%, basal inferior hypokinesis, normal RV size and systolic function.  Echo (11/22): EF 50-55%, normal RV, normal IVC Echo (8/23): EF 55-60%, RV normal  Echo (8/24): EF 55-60%, RV normal. Zio monitor (3/25): Rare PVCs Echo (8/25): EF 45%, normal RV size/systolic function, no significant valvular abnormalities.  cMRI 9/25 showed LVEF 43%, RVEF normal, non-specific LGE in basal septum.    Past Medical History:  Diagnosis Date   Anemia    CHF (congestive heart failure) (HCC)    Depression    Diabetes mellitus without complication (HCC)    Fibromyalgia    Hyperlipidemia    Hypertension    Hypothyroidism    Current Outpatient Medications  Medication Sig Dispense Refill   acetaminophen  (TYLENOL ) 325 MG tablet Take 650 mg by mouth every 6 (six) hours as needed for mild pain.     aspirin  81 MG EC tablet Adult Low Dose Aspirin  81 mg tablet,delayed release  Take 1 tablet every day by oral route.  atorvastatin  (LIPITOR) 20 MG tablet Take 20 mg by mouth daily.     carvedilol  (COREG ) 25 MG tablet TAKE 1 TABLET BY MOUTH TWICE A DAY WITH FOOD 180 tablet 3   Cholecalciferol 25 MCG (1000 UT) tablet Take 1,000 Units by mouth daily.     Continuous Glucose Sensor (DEXCOM G7 SENSOR) MISC by Does not apply route.     cyclobenzaprine  (FLEXERIL ) 10 MG tablet TAKE 1 TABLET (10 MG TOTAL) BY MOUTH IN THE MORNING AND AT BEDTIME 60 tablet 1   famotidine (PEPCID) 40 MG tablet Take 40 mg by mouth at bedtime.     FLUoxetine   (PROZAC ) 20 MG capsule TAKE 1 CAPSULE BY MOUTH EVERY DAY 90 capsule 1   furosemide  (LASIX ) 40 MG tablet TAKE 1 TABLET BY MOUTH TWICE A DAY 180 tablet 3   gabapentin  (NEURONTIN ) 600 MG tablet TAKE 1 TABLET BY MOUTH TWICE A DAY 180 tablet 1   Insulin  Degludec (TRESIBA [place]) Inject 20 Units into the skin daily.     Iron-FA-B Cmp-C-Biot-Probiotic (FUSION PLUS) CAPS TAKE 1 CAPSULE BY MOUTH DAILY. TAKE 1 CAPSULE BY MOUTH DAILY. 90 capsule 3   levothyroxine  (SYNTHROID ) 112 MCG tablet Take 112 mcg by mouth daily before breakfast.     Multiple Vitamin (MULTIVITAMIN WITH MINERALS) TABS tablet Take 1 tablet by mouth daily.     NOVOTWIST 32G X 5 MM MISC Use as directed with Victozia.  2   ONETOUCH VERIO test strip 1 each by Other route.     ONETOUCH VERIO test strip SMARTSIG:1 Each Via Meter Twice Daily     sacubitril -valsartan  (ENTRESTO ) 24-26 MG TAKE 1 TABLET BY MOUTH TWICE A DAY 180 tablet 3   Semaglutide, 2 MG/DOSE, (OZEMPIC, 2 MG/DOSE,) 8 MG/3ML SOPN Inject 2 mg into the skin once a week.     spironolactone  (ALDACTONE ) 25 MG tablet Take 1 tablet (25 mg total) by mouth daily. (Patient taking differently: Take 12.5 mg by mouth daily.) 90 tablet 3   traMADol  (ULTRAM ) 50 MG tablet TAKE 1 TABLET (50 MG TOTAL) BY MOUTH EVERY 6 (SIX) HOURS AS NEEDED FOR MODERATE PAIN 180 tablet 0   Urine Glucose-Ketones Test (KETO-DIASTIX) STRP      No current facility-administered medications for this encounter.   Allergies:   Nitrofurantoin   Social History:  The patient  reports that she has never smoked. She has never used smokeless tobacco. She reports that she does not drink alcohol and does not use drugs.   Family History:  The patient's family history includes Atrial fibrillation in her maternal grandmother and mother; Diabetes Mellitus II in her brother and father; Hypertension in her brother, father, maternal grandmother, and mother; Kidney disease in her father; Pulmonary fibrosis in her mother; Stroke in her  maternal grandmother.   ROS:  Please see the history of present illness.   All other systems are personally reviewed and negative.   Wt Readings from Last 3 Encounters:  08/11/24 74.5 kg (164 lb 3.2 oz)  06/08/24 74.3 kg (163 lb 12.8 oz)  04/30/24 74.8 kg (164 lb 12.8 oz)   BP 106/78   Pulse 92   Ht 5' 5 (1.651 m)   Wt 74.5 kg (164 lb 3.2 oz)   SpO2 96%   BMI 27.32 kg/m   Physical Exam:   General:  NAD. No resp difficulty, walked into clinic HEENT: Normal Neck: Supple. No JVD. Cor: Regular rate & rhythm. No rubs, gallops or murmurs. Lungs: Clear Abdomen: Soft, nontender, nondistended.  Extremities:  No cyanosis, clubbing, rash, edema Neuro: Alert & oriented x 3, moves all 4 extremities w/o difficulty. Affect pleasant.  Assessment & Plan: 1. Chronic systolic CHF: Echo 11/16 showed EF 20%. cMRI EF 20-25%, RV mod dilated.  cMRI (11/16) showed an area of mid-wall septal late gadolinium enhancement. NICM perhaps related to HTN versus viral myocarditis. SPEP negative.  09/16/2015 RHC/LHC coronaries ok, low cardiac index 1.7.  Echo  5/17 and again in 12/17 showed some improvement, EF 35-40%.  CPX in 12/17 showed low normal peak VO2 with no clear cardiopulmonary limitation. Echo in 11/18 showed EF up to 50-55%, Echo in 12/19 showed EF 45-50%, echo in 8/21 showed EF 50-55%.  Echo in 11/22 was stable with EF 50-55%. Echo 8/23 EF 55-60%, RV normal. Echo 8/24 EF 55-60%, RV normal.  Echo 8/25 showed EF lower at 45%.   cMRI 9/25 showed LVEF 43%, RVEF normal, non-specific LGE in basal septum. Unclear etiology for drop in EF. Symptomatically no changes. NYHA class II, not volume overloaded on exam.   - Continue Lasix  40 mg bid. She had labs drawn at endocrinology appt early today, will request these. - Continue Coreg  25 mg bid. - Continue spironolactone  12.5 mg daily.  - Continue Entresto  24-26 mg bid. - off Jardiance due to recurrent yeast infections and UTIs  - Repeat echo down the road. 2.  Diabetes: Off empagliflozin due to recurrent yeast infections.  3. OSA: Encouraged CPAP 4. Hyperlipidemia: Continue statin. Good LDL in 7/25.  5. Fibromyalgia: Disabled since 1996.   Follow up in 3-4 months with Dr. Rolan  Signed, Robin CHRISTELLA Gainer, FNP  08/11/2024  [facility] [address] [facility] [place] [address] [phone] (office) [phone] (fax)

## 2024-08-10 ENCOUNTER — Telehealth (HOSPITAL_COMMUNITY): Payer: Self-pay

## 2024-08-10 NOTE — Telephone Encounter (Signed)
 Called to confirm/remind patient of their appointment at the [facility] on 08/11/24.   Appointment:   [x] Confirmed  [] Left mess   [] No answer/No voice mail  [] VM Full/unable to leave message  [] Phone not in service  Patient reminded to bring all medications and/or complete list.  Confirmed patient has transportation. Gave directions, instructed to utilize valet parking.

## 2024-08-11 ENCOUNTER — Encounter (HOSPITAL_COMMUNITY): Payer: Self-pay

## 2024-08-11 ENCOUNTER — Ambulatory Visit (HOSPITAL_COMMUNITY)
Admission: RE | Admit: 2024-08-11 | Discharge: 2024-08-11 | Disposition: A | Discharge status: Home / Self Care | Source: Ambulatory Visit | Attending: Family Medicine | Admitting: Family Medicine

## 2024-08-11 VITALS — BP 106/78 | HR 92 | Ht 65.0 in | Wt 164.2 lb

## 2024-08-11 DIAGNOSIS — Z794 Long term (current) use of insulin: Secondary | ICD-10-CM | POA: Diagnosis not present

## 2024-08-11 DIAGNOSIS — Z79899 Other long term (current) drug therapy: Secondary | ICD-10-CM | POA: Insufficient documentation

## 2024-08-11 DIAGNOSIS — I428 Other cardiomyopathies: Secondary | ICD-10-CM | POA: Diagnosis not present

## 2024-08-11 DIAGNOSIS — E039 Hypothyroidism, unspecified: Secondary | ICD-10-CM | POA: Diagnosis not present

## 2024-08-11 DIAGNOSIS — M797 Fibromyalgia: Secondary | ICD-10-CM | POA: Insufficient documentation

## 2024-08-11 DIAGNOSIS — Z8249 Family history of ischemic heart disease and other diseases of the circulatory system: Secondary | ICD-10-CM | POA: Insufficient documentation

## 2024-08-11 DIAGNOSIS — E785 Hyperlipidemia, unspecified: Secondary | ICD-10-CM | POA: Diagnosis not present

## 2024-08-11 DIAGNOSIS — Z7985 Long-term (current) use of injectable non-insulin antidiabetic drugs: Secondary | ICD-10-CM | POA: Insufficient documentation

## 2024-08-11 DIAGNOSIS — E1169 Type 2 diabetes mellitus with other specified complication: Secondary | ICD-10-CM

## 2024-08-11 DIAGNOSIS — I5022 Chronic systolic (congestive) heart failure: Secondary | ICD-10-CM | POA: Insufficient documentation

## 2024-08-11 DIAGNOSIS — G4733 Obstructive sleep apnea (adult) (pediatric): Secondary | ICD-10-CM | POA: Insufficient documentation

## 2024-08-11 DIAGNOSIS — E1165 Type 2 diabetes mellitus with hyperglycemia: Secondary | ICD-10-CM | POA: Diagnosis not present

## 2024-08-11 DIAGNOSIS — I110 Hypertensive heart disease with heart failure: Secondary | ICD-10-CM | POA: Diagnosis not present

## 2024-08-11 DIAGNOSIS — E119 Type 2 diabetes mellitus without complications: Secondary | ICD-10-CM | POA: Diagnosis not present

## 2024-08-11 DIAGNOSIS — Z833 Family history of diabetes mellitus: Secondary | ICD-10-CM | POA: Insufficient documentation

## 2024-08-11 NOTE — Patient Instructions (Signed)
  Follow-Up in: 3-4 months with Dr. Rolan PLEASE CALL OUR OFFICE AROUND NOVEMBER/EARLY DECEMBER TO GET SCHEDULED FOR YOUR APPOINTMENT. PHONE NUMBER IS [phone] OPTION 2   At the [facility], you and your health needs are our priority. We have a designated team specialized in the treatment of Heart Failure. This Care Team includes your primary Heart Failure Specialized Cardiologist (physician), Advanced Practice Providers (APPs- Physician Assistants and Nurse Practitioners), and Pharmacist who all work together to provide you with the care you need, when you need it.   You may see any of the following providers on your designated Care Team at your next follow up:  Dr. Toribio Fuel Dr. Ezra Rolan Dr. Ria Commander Dr. Odis Brownie Greig Mosses, NP [place]affie Shed, [place] [facility] [place], [place] Beckey Coe, NP [place] Lee, NP Tinnie Redman, PharmD   Please be sure to bring in all your medications bottles to every appointment.   Need to Contact Us :  If you have any questions or concerns before your next appointment please send us  a message through [place] or call our office at [phone].    TO LEAVE A MESSAGE FOR THE NURSE SELECT OPTION 2, PLEASE LEAVE A MESSAGE INCLUDING: YOUR NAME DATE OF BIRTH CALL BACK NUMBER REASON FOR CALL**this is important as we prioritize the call backs  YOU WILL RECEIVE A CALL BACK THE SAME DAY AS LONG AS YOU CALL BEFORE 4:00 PM

## 2024-08-11 NOTE — Addendum Note (Signed)
 Encounter addended by: Tita Andriette NOVAK, RN on: 08/11/2024 1:33 PM  Actions taken: Pend clinical note, Clinical Note Signed

## 2024-08-16 ENCOUNTER — Other Ambulatory Visit: Payer: Self-pay | Admitting: Family Medicine

## 2024-08-16 DIAGNOSIS — M797 Fibromyalgia: Secondary | ICD-10-CM

## 2024-08-18 DIAGNOSIS — E1165 Type 2 diabetes mellitus with hyperglycemia: Secondary | ICD-10-CM | POA: Diagnosis not present

## 2024-08-18 DIAGNOSIS — E039 Hypothyroidism, unspecified: Secondary | ICD-10-CM | POA: Diagnosis not present

## 2024-08-18 DIAGNOSIS — Z794 Long term (current) use of insulin: Secondary | ICD-10-CM | POA: Diagnosis not present

## 2024-08-18 DIAGNOSIS — N1832 Chronic kidney disease, stage 3b: Secondary | ICD-10-CM | POA: Diagnosis not present

## 2024-08-20 ENCOUNTER — Other Ambulatory Visit: Payer: Self-pay | Admitting: Family Medicine

## 2024-08-20 ENCOUNTER — Other Ambulatory Visit (HOSPITAL_COMMUNITY): Payer: Self-pay | Admitting: Internal Medicine

## 2024-08-20 ENCOUNTER — Other Ambulatory Visit (HOSPITAL_COMMUNITY): Payer: Self-pay | Admitting: Cardiology

## 2024-08-20 ENCOUNTER — Ambulatory Visit (INDEPENDENT_AMBULATORY_CARE_PROVIDER_SITE_OTHER)

## 2024-08-20 DIAGNOSIS — Z23 Encounter for immunization: Secondary | ICD-10-CM

## 2024-08-20 DIAGNOSIS — M797 Fibromyalgia: Secondary | ICD-10-CM

## 2024-08-20 NOTE — Progress Notes (Signed)
 Robin Owen is a 63 y.o. female presents in office today for a nurse visit for Flu Vaccine.   Patient Injection was given in the  Left deltoid. Patient tolerated injection well.   Patient's next injection due n/a, appt made? not applicable  [employer]

## 2024-09-15 ENCOUNTER — Ambulatory Visit (INDEPENDENT_AMBULATORY_CARE_PROVIDER_SITE_OTHER): Payer: Medicare HMO | Admitting: *Deleted

## 2024-09-15 VITALS — Ht 65.0 in | Wt 164.0 lb

## 2024-09-15 DIAGNOSIS — Z0000 Encounter for general adult medical examination without abnormal findings: Secondary | ICD-10-CM

## 2024-09-15 DIAGNOSIS — Z13820 Encounter for screening for osteoporosis: Secondary | ICD-10-CM

## 2024-09-15 NOTE — Progress Notes (Signed)
 Chief Complaint  Patient presents with   Medicare Wellness     Subjective:   Robin Owen is a 63 y.o. female who presents for a Medicare Annual Wellness Visit.  Allergies (verified) Nitrofurantoin   History: Past Medical History:  Diagnosis Date   Anemia    CHF (congestive heart failure) (HCC)    Depression    Diabetes mellitus without complication (HCC)    Fibromyalgia    Hyperlipidemia    Hypertension    Hypothyroidism    Past Surgical History:  Procedure Laterality Date   CARDIAC CATHETERIZATION N/A 09/16/2015   Procedure: Right/Left Heart Cath and Coronary Angiography;  Surgeon: Peter M Jordan, MD;  Location: [facility];  Service: Cardiovascular;  Laterality: N/A;   ENDOMETRIAL ABLATION  2012   Family History  Problem Relation Age of Onset   Pulmonary fibrosis Mother    Hypertension Mother    Atrial fibrillation Mother    Hypertension Father    Diabetes Mellitus II Father    Kidney disease Father    Diabetes Mellitus II Brother    Hypertension Brother    Atrial fibrillation Maternal Grandmother    Hypertension Maternal Grandmother    Stroke Maternal Grandmother    Social History   Occupational History   Not on file  Tobacco Use   Smoking status: Never   Smokeless tobacco: Never  Substance and Sexual Activity   Alcohol use: No   Drug use: No   Sexual activity: Yes    Birth control/protection: None   Tobacco Counseling Counseling given: Not Answered  SDOH Screenings   Food Insecurity: No Food Insecurity (09/15/2024)  Housing: Low Risk  (09/15/2024)  Transportation Needs: No Transportation Needs (09/15/2024)  Utilities: Not At Risk (09/15/2024)  Alcohol Screen: Low Risk  (08/14/2023)  Depression (PHQ2-9): Medium Risk (09/15/2024)  Financial Resource Strain: Medium Risk (09/14/2024)  Physical Activity: Inactive (09/15/2024)  Social Connections: Moderately Integrated (09/15/2024)  Stress: Stress Concern Present (09/15/2024)  Tobacco  Use: Low Risk  (09/15/2024)  Health Literacy: Adequate Health Literacy (09/15/2024)   See flowsheets for full screening details  Depression Screen PHQ 2 & 9 Depression Scale- Over the past 2 weeks, how often have you been bothered by any of the following problems? Little interest or pleasure in doing things: 0 Feeling down, depressed, or hopeless (PHQ Adolescent also includes...irritable): 0 PHQ-2 Total Score: 0 Trouble falling or staying asleep, or sleeping too much: 2 Feeling tired or having little energy: 2 Poor appetite or overeating (PHQ Adolescent also includes...weight loss): 1 Feeling bad about yourself - or that you are a failure or have let yourself or your family down: 0 Trouble concentrating on things, such as reading the newspaper or watching television (PHQ Adolescent also includes...like school work): 1 Moving or speaking so slowly that other people could have noticed. Or the opposite - being so fidgety or restless that you have been moving around a lot more than usual: 0 Thoughts that you would be better off dead, or of hurting yourself in some way: 0 PHQ-9 Total Score: 6 If you checked off any problems, how difficult have these problems made it for you to do your work, take care of things at home, or get along with other people?: Not difficult at all     Goals Addressed             This Visit's Progress    Patient Stated   On track    Continue current lifestyle  Patient Stated       Not getting so tired so easy       Visit info / Clinical Intake: Medicare Wellness Visit Type:: Subsequent Annual Wellness Visit Persons participating in visit:: patient Medicare Wellness Visit Mode:: Video Because this visit was a virtual/telehealth visit:: unable to obtan vitals due to lack of equipment If Telephone or Video please confirm:: I connected with the patient using audio enabled telemedicine application and verified that I am speaking with the correct person using  two identifiers Patient Location:: home Provider Location:: home Information given by:: patient Interpreter Needed?: No Pre-visit prep was completed: no AWV questionnaire completed by patient prior to visit?: yes Date:: 09/14/24 Living arrangements:: lives with spouse/significant other Patient's Overall Health Status Rating: (!) fair Typical amount of pain: some Does pain affect daily life?: (!) yes Are you currently prescribed opioids?: no  Dietary Habits and Nutritional Risks How many meals a day?: 3 Eats fruit and vegetables daily?: yes Most meals are obtained by: preparing own meals; having others provide food Diabetic:: (!) yes Any non-healing wounds?: no How often do you check your BS?: continuous glucose monitor Would you like to be referred to a Nutritionist or for Diabetic Management? : no  Functional Status Activities of Daily Living (to include ambulation/medication): (!) Needs Assist Ambulation: Independent Medication Administration: Independent Home Management: Needs assistance (comment) Manage your own finances?: (!) no Primary transportation is: driving Concerns about vision?: no *vision screening is required for WTM* Concerns about hearing?: no  Fall Screening Falls in the past year?: 0 Number of falls in past year: 0 Was there an injury with Fall?: 0 Fall Risk Category Calculator: 0 Patient Fall Risk Level: Low Fall Risk  Fall Risk Patient at Risk for Falls Due to: No Fall Risks Fall risk Follow up: Falls evaluation completed; Education provided; Falls prevention discussed; Follow up appointment  Home and Transportation Safety: All rugs have non-skid backing?: yes All stairs or steps have railings?: yes Grab bars in the bathtub or shower?: yes Have non-skid surface in bathtub or shower?: (!) no Good home lighting?: yes Regular seat belt use?: yes Hospital stays in the last year:: no  Cognitive Assessment Difficulty concentrating, remembering, or  making decisions? : yes Will 6CIT or Mini Cog be Completed: yes What year is it?: 0 points What month is it?: 0 points Give patient an address phrase to remember (5 components): Its very sunny outside today in November About what time is it?: 0 points Count backwards from 20 to 1: 0 points Say the months of the year in reverse: 0 points Repeat the address phrase from earlier: 0 points 6 CIT Score: 0 points  Advance Directives (For Healthcare) Does Patient Have a Medical Advance Directive?: No Would patient like information on creating a medical advance directive?: No - Patient declined  Reviewed/Updated  Reviewed/Updated: Reviewed All (Medical, Surgical, Family, Medications, Allergies, Care Teams, Patient Goals); Surgical History; Family History; Medications; Allergies; Care Teams; Patient Goals; Medical History        Objective:    Today's Vitals   09/15/24 1050  Weight: 164 lb (74.4 kg)  Height: 5' 5 (1.651 m)   Body mass index is 27.29 kg/m.  Current Medications (verified) Outpatient Encounter Medications as of 09/15/2024  Medication Sig   acetaminophen  (TYLENOL ) 325 MG tablet Take 650 mg by mouth every 6 (six) hours as needed for mild pain.   aspirin  81 MG EC tablet Adult Low Dose Aspirin  81 mg tablet,delayed release  Take  1 tablet every day by oral route.   atorvastatin  (LIPITOR) 20 MG tablet Take 20 mg by mouth daily.   carvedilol  (COREG ) 25 MG tablet TAKE 1 TABLET BY MOUTH TWICE A DAY WITH FOOD   Cholecalciferol 25 MCG (1000 UT) tablet Take 1,000 Units by mouth daily.   Continuous Glucose Sensor (DEXCOM G7 SENSOR) MISC by Does not apply route.   cyclobenzaprine  (FLEXERIL ) 10 MG tablet TAKE 1 TABLET (10 MG TOTAL) BY MOUTH IN THE MORNING AND AT BEDTIME   famotidine (PEPCID) 40 MG tablet Take 40 mg by mouth at bedtime.   FLUoxetine  (PROZAC ) 20 MG capsule TAKE 1 CAPSULE BY MOUTH EVERY DAY   furosemide  (LASIX ) 40 MG tablet TAKE 1 TABLET BY MOUTH TWICE A DAY    gabapentin  (NEURONTIN ) 600 MG tablet TAKE 1 TABLET BY MOUTH TWICE A DAY   Insulin  Degludec (TRESIBA [place]) Inject 20 Units into the skin daily.   Iron-FA-B Cmp-C-Biot-Probiotic (FUSION PLUS) CAPS TAKE 1 CAPSULE BY MOUTH DAILY.   levothyroxine  (SYNTHROID ) 112 MCG tablet Take 112 mcg by mouth daily before breakfast.   Multiple Vitamin (MULTIVITAMIN WITH MINERALS) TABS tablet Take 1 tablet by mouth daily.   NOVOTWIST 32G X 5 MM MISC Use as directed with Victozia.   ONETOUCH VERIO test strip 1 each by Other route.   ONETOUCH VERIO test strip SMARTSIG:1 Each Via Meter Twice Daily   sacubitril -valsartan  (ENTRESTO ) 24-26 MG TAKE 1 TABLET BY MOUTH TWICE A DAY   Semaglutide, 2 MG/DOSE, (OZEMPIC, 2 MG/DOSE,) 8 MG/3ML SOPN Inject 2 mg into the skin once a week.   spironolactone  (ALDACTONE ) 25 MG tablet Take 1 tablet (25 mg total) by mouth daily.   traMADol  (ULTRAM ) 50 MG tablet TAKE 1 TABLET (50 MG TOTAL) BY MOUTH EVERY 6 (SIX) HOURS AS NEEDED FOR MODERATE PAIN   Urine Glucose-Ketones Test (KETO-DIASTIX) STRP    No facility-administered encounter medications on file as of 09/15/2024.   Hearing/Vision screen Hearing Screening - Comments:: No trouble hearing Vision Screening - Comments:: Up to date groat Immunizations and Health Maintenance Health Maintenance  Topic Date Due   Diabetic kidney evaluation - Urine ACR  Never done   Zoster Vaccines- Shingrix (1 of 2) 10/08/1982   Colonoscopy  Never done   HEMOGLOBIN A1C  11/29/2022   OPHTHALMOLOGY EXAM  02/07/2023   Mammogram  10/17/2024   COVID-19 Vaccine (4 - 2025-26 season) 10/01/2024 (Originally 06/29/2024)   Cervical Cancer Screening (HPV/Pap Cotest)  01/09/2025   FOOT EXAM  04/30/2025   Diabetic kidney evaluation - eGFR measurement  06/18/2025   Medicare Annual Wellness (AWV)  09/15/2025   Pneumococcal Vaccine: 50+ Years  Completed   Influenza Vaccine  Completed   Hepatitis C Screening  Completed   HIV Screening  Completed   Hepatitis B  Vaccines 19-59 Average Risk  Aged Out   HPV VACCINES  Aged Out   Meningococcal B Vaccine  Aged Out   DTaP/Tdap/Td  Discontinued        Assessment/Plan:  This is a routine wellness examination for Robin Owen.  Patient Care Team: Levora Reyes SAUNDERS, MD as PCP - General (Family Medicine) Shlomo Wilbert SAUNDERS, MD as PCP - Cardiology (Cardiology) Rolan Ezra RAMAN, MD as PCP - Advanced Heart Failure (Cardiology) [facility]  I have personally reviewed and noted the following in the patient's chart:   Medical and social history Use of alcohol, tobacco or illicit drugs  Current medications and supplements including opioid prescriptions. Functional ability and status Nutritional status  Physical activity Advanced directives List of other physicians Hospitalizations, surgeries, and ER visits in previous 12 months Vitals Screenings to include cognitive, depression, and falls Referrals and appointments  Orders Placed This Encounter  Procedures   DG Bone Density    Patient would [facility] to go to [facility]    Reason for Exam (SYMPTOM  OR DIAGNOSIS REQUIRED):   screening for osteoporosis    Preferred imaging location?:   External   In addition, I have reviewed and discussed with patient certain preventive protocols, quality metrics, and best practice recommendations. A written personalized care plan for preventive services as well as general preventive health recommendations were provided to patient.   Mliss Graff, LPN   [id number]   Return in 1 year (on 09/15/2025).  After Visit Summary: (MyChart) Due to this being a telephonic visit, the after visit summary with patients personalized plan was offered to patient via MyChart   Nurse Notes:

## 2024-09-15 NOTE — Patient Instructions (Addendum)
 Robin Owen,  Thank you for taking the time for your Medicare Wellness Visit. I appreciate your continued commitment to your health goals. Please review the care plan we discussed, and feel free to reach out if I can assist you further.  Please note that Annual Wellness Visits do not include a physical exam. Some assessments may be limited, especially if the visit was conducted virtually. If needed, we may recommend an in-person follow-up with your provider.  Ongoing Care Seeing your primary care provider every 3 to 6 months helps us  monitor your health and provide consistent, personalized care.   Referrals If a referral was made during today's visit and you haven't received any updates within two weeks, please contact the referred provider directly to check on the status.  Recommended Screenings:  Health Maintenance  Topic Date Due   Yearly kidney health urinalysis for diabetes  Never done   Zoster (Shingles) Vaccine (1 of 2) 10/08/1982   Colon Cancer Screening  Never done   Hemoglobin A1C  11/29/2022   Eye exam for diabetics  02/07/2023   Breast Cancer Screening  10/17/2024   COVID-19 Vaccine (4 - 2025-26 season) 10/01/2024*   Pap with HPV screening  01/09/2025   Complete foot exam   04/30/2025   Yearly kidney function blood test for diabetes  06/18/2025   Medicare Annual Wellness Visit  09/15/2025   Pneumococcal Vaccine for age over 23  Completed   Flu Shot  Completed   Hepatitis C Screening  Completed   HIV Screening  Completed   Hepatitis B Vaccine  Aged Out   HPV Vaccine  Aged Out   Meningitis B Vaccine  Aged Out   DTaP/Tdap/Td vaccine  Discontinued  *Topic was postponed. The date shown is not the original due date.       09/15/2024   10:51 AM  Advanced Directives  Does Patient Have a Medical Advance Directive? No  Would patient like information on creating a medical advance directive? No - Patient declined    Vision: Annual vision screenings are recommended for  early detection of glaucoma, cataracts, and diabetic retinopathy. These exams can also reveal signs of chronic conditions such as diabetes and high blood pressure.  Dental: Annual dental screenings help detect early signs of oral cancer, gum disease, and other conditions linked to overall health, including heart disease and diabetes.  Please see the attached documents for additional preventive care recommendations.          Robin Owen , Thank you for taking time to come for your Medicare Wellness Visit. I appreciate your ongoing commitment to your health goals. Please review the following plan we discussed and let me know if I can assist you in the future.  Screening recommendations/referrals: Colonoscopy:  Mammogram:  Bone Density:  Recommended yearly ophthalmology/optometry visit for glaucoma screening and checkup Recommended yearly dental visit for hygiene and checkup  Vaccinations: Influenza vaccine:  Pneumococcal vaccine:  Tdap vaccine:  Shingles vaccine:       Preventive Care 65 Years and Older, Female Preventive care refers to lifestyle choices and visits with your health care provider that can promote health and wellness. What does preventive care include? A yearly physical exam. This is also called an annual well check. Dental exams once or twice a year. Routine eye exams. Ask your health care provider how often you should have your eyes checked. Personal lifestyle choices, including: Daily care of your teeth and gums. Regular physical activity. Eating a healthy diet. Avoiding tobacco  and drug use. Limiting alcohol use. Practicing safe sex. Taking low-dose aspirin  every day. Taking vitamin and mineral supplements as recommended by your health care provider. What happens during an annual well check? The services and screenings done by your health care provider during your annual well check will depend on your age, overall health, lifestyle risk factors, and family  history of disease. Counseling  Your health care provider may ask you questions about your: Alcohol use. Tobacco use. Drug use. Emotional well-being. Home and relationship well-being. Sexual activity. Eating habits. History of falls. Memory and ability to understand (cognition). Work and work astronomer. Reproductive health. Screening  You may have the following tests or measurements: Height, weight, and BMI. Blood pressure. Lipid and cholesterol levels. These may be checked every 5 years, or more frequently if you are over 27 years old. Skin check. Lung cancer screening. You may have this screening every year starting at age 11 if you have a 30-pack-year history of smoking and currently smoke or have quit within the past 15 years. Fecal occult blood test (FOBT) of the stool. You may have this test every year starting at age 54. Flexible sigmoidoscopy or colonoscopy. You may have a sigmoidoscopy every 5 years or a colonoscopy every 10 years starting at age 80. Hepatitis C blood test. Hepatitis B blood test. Sexually transmitted disease (STD) testing. Diabetes screening. This is done by checking your blood sugar (glucose) after you have not eaten for a while (fasting). You may have this done every 1-3 years. Bone density scan. This is done to screen for osteoporosis. You may have this done starting at age 43. Mammogram. This may be done every 1-2 years. Talk to your health care provider about how often you should have regular mammograms. Talk with your health care provider about your test results, treatment options, and if necessary, the need for more tests. Vaccines  Your health care provider may recommend certain vaccines, such as: Influenza vaccine. This is recommended every year. Tetanus, diphtheria, and acellular pertussis (Tdap, Td) vaccine. You may need a Td booster every 10 years. Zoster vaccine. You may need this after age 69. Pneumococcal 13-valent conjugate (PCV13)  vaccine. One dose is recommended after age 4. Pneumococcal polysaccharide (PPSV23) vaccine. One dose is recommended after age 8. Talk to your health care provider about which screenings and vaccines you need and how often you need them. This information is not intended to replace advice given to you by your health care provider. Make sure you discuss any questions you have with your health care provider. Document Released: 11/11/2015 Document Revised: 07/04/2016 Document Reviewed: 08/16/2015 Elsevier Interactive Patient Education  2017 [employer].  Fall Prevention in the Home Falls can cause injuries. They can happen to people of all ages. There are many things you can do to make your home safe and to help prevent falls. What can I do on the outside of my home? Regularly fix the edges of walkways and driveways and fix any cracks. Remove anything that might make you trip as you walk through a door, such as a raised step or threshold. Trim any bushes or trees on the path to your home. Use bright outdoor lighting. Clear any walking paths of anything that might make someone trip, such as rocks or tools. Regularly check to see if handrails are loose or broken. Make sure that both sides of any steps have handrails. Any raised decks and porches should have guardrails on the edges. Have any leaves, snow, or  ice cleared regularly. Use sand or salt on walking paths during winter. Clean up any spills in your garage right away. This includes oil or grease spills. What can I do in the bathroom? Use night lights. Install grab bars by the toilet and in the tub and shower. Do not use towel bars as grab bars. Use non-skid mats or decals in the tub or shower. If you need to sit down in the shower, use a plastic, non-slip stool. Keep the floor dry. Clean up any water that spills on the floor as soon as it happens. Remove soap buildup in the tub or shower regularly. Attach bath mats securely with  double-sided non-slip rug tape. Do not have throw rugs and other things on the floor that can make you trip. What can I do in the bedroom? Use night lights. Make sure that you have a light by your bed that is easy to reach. Do not use any sheets or blankets that are too big for your bed. They should not hang down onto the floor. Have a firm chair that has side arms. You can use this for support while you get dressed. Do not have throw rugs and other things on the floor that can make you trip. What can I do in the kitchen? Clean up any spills right away. Avoid walking on wet floors. Keep items that you use a lot in easy-to-reach places. If you need to reach something above you, use a strong step stool that has a grab bar. Keep electrical cords out of the way. Do not use floor polish or wax that makes floors slippery. If you must use wax, use non-skid floor wax. Do not have throw rugs and other things on the floor that can make you trip. What can I do with my stairs? Do not leave any items on the stairs. Make sure that there are handrails on both sides of the stairs and use them. Fix handrails that are broken or loose. Make sure that handrails are as long as the stairways. Check any carpeting to make sure that it is firmly attached to the stairs. Fix any carpet that is loose or worn. Avoid having throw rugs at the top or bottom of the stairs. If you do have throw rugs, attach them to the floor with carpet tape. Make sure that you have a light switch at the top of the stairs and the bottom of the stairs. If you do not have them, ask someone to add them for you. What else can I do to help prevent falls? Wear shoes that: Do not have high heels. Have rubber bottoms. Are comfortable and fit you well. Are closed at the toe. Do not wear sandals. If you use a stepladder: Make sure that it is fully opened. Do not climb a closed stepladder. Make sure that both sides of the stepladder are locked  into place. Ask someone to hold it for you, if possible. Clearly mark and make sure that you can see: Any grab bars or handrails. First and last steps. Where the edge of each step is. Use tools that help you move around (mobility aids) if they are needed. These include: Canes. Walkers. Scooters. Crutches. Turn on the lights when you go into a dark area. Replace any light bulbs as soon as they burn out. Set up your furniture so you have a clear path. Avoid moving your furniture around. If any of your floors are uneven, fix them. If there are any  pets around you, be aware of where they are. Review your medicines with your doctor. Some medicines can make you feel dizzy. This can increase your chance of falling. Ask your doctor what other things that you can do to help prevent falls. This information is not intended to replace advice given to you by your health care provider. Make sure you discuss any questions you have with your health care provider. Document Released: 08/11/2009 Document Revised: 03/22/2016 Document Reviewed: 11/19/2014 Elsevier Interactive Patient Education  2017 [employer].

## 2024-09-27 ENCOUNTER — Other Ambulatory Visit (HOSPITAL_COMMUNITY): Payer: Self-pay | Admitting: Cardiology

## 2024-09-27 ENCOUNTER — Other Ambulatory Visit: Payer: Self-pay | Admitting: Family Medicine

## 2024-09-27 ENCOUNTER — Other Ambulatory Visit: Payer: Self-pay | Admitting: Nurse Practitioner

## 2024-09-27 DIAGNOSIS — M797 Fibromyalgia: Secondary | ICD-10-CM

## 2024-09-27 DIAGNOSIS — R2241 Localized swelling, mass and lump, right lower limb: Secondary | ICD-10-CM

## 2024-09-28 NOTE — Telephone Encounter (Signed)
 Office visit in July.  Controlled substance database reviewed.  Tramadol  No. 180 last filled on 07/10/2024.  45-day supply.  Previously filled on 04/13/2024.  Refill ordered.

## 2024-09-28 NOTE — Telephone Encounter (Signed)
 Requested Prescriptions   Pending Prescriptions Disp Refills   traMADol  (ULTRAM ) 50 MG tablet [Pharmacy Med Name: TRAMADOL  HCL 50 MG TABLET] 180 tablet 0    Sig: TAKE 1 TABLET (50 MG TOTAL) BY MOUTH EVERY 6 (SIX) HOURS AS NEEDED FOR MODERATE PAIN     Date of patient request: 09/28/2024 Last office visit: 04/30/2024 Upcoming visit: 10/01/2024 Date of last refill: 07/10/2024 Last refill amount: 180

## 2024-10-01 ENCOUNTER — Ambulatory Visit: Admitting: Family Medicine

## 2024-10-01 ENCOUNTER — Telehealth: Payer: Self-pay

## 2024-10-01 VITALS — BP 108/68 | HR 87 | Temp 98.7°F | Resp 15 | Ht 65.0 in | Wt 165.8 lb

## 2024-10-01 DIAGNOSIS — M797 Fibromyalgia: Secondary | ICD-10-CM | POA: Diagnosis not present

## 2024-10-01 DIAGNOSIS — E1165 Type 2 diabetes mellitus with hyperglycemia: Secondary | ICD-10-CM | POA: Diagnosis not present

## 2024-10-01 DIAGNOSIS — Z794 Long term (current) use of insulin: Secondary | ICD-10-CM

## 2024-10-01 DIAGNOSIS — I10 Essential (primary) hypertension: Secondary | ICD-10-CM

## 2024-10-01 DIAGNOSIS — I5022 Chronic systolic (congestive) heart failure: Secondary | ICD-10-CM | POA: Diagnosis not present

## 2024-10-01 DIAGNOSIS — K5900 Constipation, unspecified: Secondary | ICD-10-CM | POA: Diagnosis not present

## 2024-10-01 DIAGNOSIS — E785 Hyperlipidemia, unspecified: Secondary | ICD-10-CM

## 2024-10-01 DIAGNOSIS — N1831 Chronic kidney disease, stage 3a: Secondary | ICD-10-CM

## 2024-10-01 DIAGNOSIS — Z0279 Encounter for issue of other medical certificate: Secondary | ICD-10-CM

## 2024-10-01 LAB — LIPID PANEL
Cholesterol: 127 mg/dL (ref 0–200)
HDL: 50.7 mg/dL (ref >39.00)
LDL Cholesterol: 60 mg/dL (ref 0–99)
NonHDL: 76.46
Total CHOL/HDL Ratio: 3
Triglycerides: 82 mg/dL (ref 0.0–149.0)
VLDL: 16.4 mg/dL (ref 0.0–40.0)

## 2024-10-01 NOTE — Patient Instructions (Addendum)
 Fiber in diet maybe better tolerated than the supplement. Colace as option for stool softener. Every other day iron may be reasonable if levels are stable and monitored. Miralax if needed as well.   Thank you for coming in today. No change in medications at this time. If there are any concerns on your bloodwork, I will let you know. Take care!  I will work on the form and let you know when that has been completed.    Constipation, Adult Constipation is when a person has fewer than three bowel movements in a week, has difficulty having a bowel movement, or has stools (feces) that are dry, hard, or larger than normal. Constipation may be caused by an underlying condition. It may become worse with age if a person takes certain medicines and does not take in enough fluids. Follow these instructions at home: Eating and drinking  Eat foods that have a lot of fiber, such as beans, whole grains, and fresh fruits and vegetables. Limit foods that are low in fiber and high in fat and processed sugars, such as fried or sweet foods. These include french fries, hamburgers, cookies, candies, and soda. Drink enough fluid to keep your urine pale yellow. General instructions Exercise regularly or as told by your health care provider. Try to do 150 minutes of moderate exercise each week. Use the bathroom when you have the urge to go. Do not hold it in. Take over-the-counter and prescription medicines only as told by your health care provider. This includes any fiber supplements. During bowel movements: Practice deep breathing while relaxing the lower abdomen. Practice pelvic floor relaxation. Watch your condition for any changes. Let your health care provider know about them. Keep all follow-up visits as told by your health care provider. This is important. Contact a health care provider if: You have pain that gets worse. You have a fever. You do not have a bowel movement after 4 days. You vomit. You are  not hungry or you lose weight. You are bleeding from the opening between the buttocks (anus). You have thin, pencil-like stools. Get help right away if: You have a fever and your symptoms suddenly get worse. You leak stool or have blood in your stool. Your abdomen is bloated. You have severe pain in your abdomen. You feel dizzy or you faint. Summary Constipation is when a person has fewer than three bowel movements in a week, has difficulty having a bowel movement, or has stools (feces) that are dry, hard, or larger than normal. Eat foods that have a lot of fiber, such as beans, whole grains, and fresh fruits and vegetables. Drink enough fluid to keep your urine pale yellow. Take over-the-counter and prescription medicines only as told by your health care provider. This includes any fiber supplements. This information is not intended to replace advice given to you by your health care provider. Make sure you discuss any questions you have with your health care provider. Document Revised: 08/29/2022 Document Reviewed: 08/29/2022 Elsevier Patient Education  2024 [employer].

## 2024-10-01 NOTE — Progress Notes (Signed)
 Subjective:  Patient ID: Robin Owen, female    DOB: 01/22/1961  Age: 63 y.o. MRN: [id number]  CC:  Chief Complaint  Patient presents with   Pain    F/u fibromyalgia. No questions or concerns.     HPI Robin Owen presents for   Fibromyalgia with depression Symptoms have been stable with current regimen of fluoxetine  20 mg daily, gabapentin  600 mg twice daily, tramadol  50 mg twice daily, Flexeril  10 mg twice daily.  Denies new side effects or sedation with this combination and has been on these meds for some time. Still various aches - about the same.  No new sedation, falls, dizziness or side effects with meds. Taking tramadol  later in the day.   Controlled substance database (PDMP) reviewed. No concerns appreciated.  Tramadol  #180 09/28/24, gapapentin #180 08/03/24.   Insulin -dependent diabetes with CKD, hypothyroidism followed by endocrinology.  Treated with [place], Synthroid , and Ozempic. A1c 08/18/24 at 7.8. no med changes. Tsh 1.68 on 08/11/24.   Hypertension: With OSA on CPAP, NYHA class II CHF and chronic kidney disease.  Lasix  40 mg daily, carvedilol  25 mg twice daily, Entresto  24/26 mg.  Unable to tolerate SGLT twos due to mycotic infections previously and has been followed by cardiology. Now back on spirinolactone 1/2 of 25mg  per day.  Home readings: similar to in office readings. No new side effects.  BP Readings from Last 3 Encounters:  10/01/24 108/68  08/11/24 106/78  06/08/24 106/70   Lab Results  Component Value Date   CREATININE 1.24 (H) 06/18/2024   Hyperlipidemia: Lipitor 20 mg daily, with stable LDL July. No new side effects. Arthralgias with fibromylagia. Has tried off statin in past without change in sx's.  Lab Results  Component Value Date   CHOL 140 04/30/2024   HDL 46.60 04/30/2024   LDLCALC 67 04/30/2024   TRIG 135.0 04/30/2024   CHOLHDL 3 04/30/2024   Lab Results  Component Value Date   ALT 30 04/30/2024   AST 26 04/30/2024   ALKPHOS  73 04/30/2024   BILITOT 0.6 04/30/2024   GERD with history of iron deficiency, constipation. Discussed at her July visit.  MiraLAX as needed for constipation.  Famotidine 40 mg nightly for GERD.Followed by gastroenterology. Stable.  Constipation still issue at times. Benefiber, citrucel causes gas. No recent miralax.   Normal iron studies on July 1 through [facility]. Still on iron once daily.   Has paperwork for chronic conditions. Needs returned in next few days if possible.   History Patient Active Problem List   Diagnosis Date Noted   Pyelonephritis 03/21/2016   Sepsis (HCC) 03/21/2016   Hyponatremia 03/21/2016   Type 2 diabetes mellitus without complication, without long-term current use of insulin  (HCC) 03/21/2016   Normocytic anemia 03/21/2016   Chronic systolic heart failure (HCC) 10/02/2015   Nonischemic cardiomyopathy (HCC)    Pulmonary hypertension (HCC)    Hyperlipidemia 09/14/2015   Essential hypertension 09/14/2015   Fibromyalgia 09/14/2015   Hypothyroidism 09/14/2015   Past Medical History:  Diagnosis Date   Anemia    CHF (congestive heart failure) (HCC)    Depression    Diabetes mellitus without complication (HCC)    Fibromyalgia    Hyperlipidemia    Hypertension    Hypothyroidism    Past Surgical History:  Procedure Laterality Date   CARDIAC CATHETERIZATION N/A 09/16/2015   Procedure: Right/Left Heart Cath and Coronary Angiography;  Surgeon: Peter M Jordan, MD;  Location: [facility];  Service: Cardiovascular;  Laterality: N/A;   ENDOMETRIAL ABLATION  2012   Allergies  Allergen Reactions   Nitrofurantoin Other (See Comments)    Fever, flu like symtoms   Prior to Admission medications   Medication Sig Start Date End Date Taking? Authorizing Provider  acetaminophen  (TYLENOL ) 325 MG tablet Take 650 mg by mouth every 6 (six) hours as needed for mild pain.   Yes [provider]  aspirin  81 MG EC tablet Adult Low Dose Aspirin  81 mg  tablet,delayed release  Take 1 tablet every day by oral route.   Yes [provider]  atorvastatin  (LIPITOR) 20 MG tablet Take 20 mg by mouth daily. 01/08/20  Yes [provider]  carvedilol  (COREG ) 25 MG tablet TAKE 1 TABLET BY MOUTH TWICE A DAY WITH FOOD 01/15/24  Yes Rolan Ezra RAMAN, MD  Cholecalciferol 25 MCG (1000 UT) tablet Take 1,000 Units by mouth daily.   Yes [provider]  Continuous Glucose Sensor (DEXCOM G7 SENSOR) MISC by Does not apply route.   Yes [provider]  cyclobenzaprine  (FLEXERIL ) 10 MG tablet TAKE 1 TABLET (10 MG TOTAL) BY MOUTH IN THE MORNING AND AT BEDTIME 08/17/24  Yes Levora Reyes SAUNDERS, MD  famotidine (PEPCID) 40 MG tablet Take 40 mg by mouth at bedtime. 05/10/20  Yes [provider]  FLUoxetine  (PROZAC ) 20 MG capsule TAKE 1 CAPSULE BY MOUTH EVERY DAY 02/04/24  Yes Levora Reyes SAUNDERS, MD  furosemide  (LASIX ) 40 MG tablet TAKE 1 TABLET BY MOUTH TWICE A DAY 08/20/24  Yes Bensimhon, Toribio SAUNDERS, MD  gabapentin  (NEURONTIN ) 600 MG tablet TAKE 1 TABLET BY MOUTH TWICE A DAY 08/20/24  Yes Levora Reyes SAUNDERS, MD  Insulin  Degludec (TRESIBA [place]) Inject 20 Units into the skin daily.   Yes [provider]  Iron-FA-B Cmp-C-Biot-Probiotic (FUSION PLUS) CAPS TAKE 1 CAPSULE BY MOUTH DAILY. 08/20/24  Yes Levora Reyes SAUNDERS, MD  levothyroxine  (SYNTHROID ) 112 MCG tablet Take 112 mcg by mouth daily before breakfast.   Yes [provider]  Multiple Vitamin (MULTIVITAMIN WITH MINERALS) TABS tablet Take 1 tablet by mouth daily.   Yes [provider]  NOVOTWIST 32G X 5 MM MISC Use as directed with Victozia. 08/19/15  Yes [provider]  ONETOUCH VERIO test strip 1 each by Other route. 04/07/24  Yes [provider]  AISHA SINKS test strip SMARTSIG:1 Each Via Meter Twice Daily 04/07/24  Yes [provider]  sacubitril -valsartan  (ENTRESTO ) 24-26 MG TAKE 1 TABLET BY MOUTH TWICE A DAY 04/13/24  Yes Rolan Ezra RAMAN, MD  Semaglutide, 2 MG/DOSE, (OZEMPIC, 2 MG/DOSE,) 8 MG/3ML SOPN Inject 2 mg into the skin once a week.   Yes [provider]  spironolactone  (ALDACTONE ) 25 MG tablet Take 1 tablet (25 mg total) by mouth daily. 06/08/24 10/01/24 Yes Rolan Ezra RAMAN, MD  traMADol  (ULTRAM ) 50 MG tablet TAKE 1 TABLET (50 MG TOTAL) BY MOUTH EVERY 6 (SIX) HOURS AS NEEDED FOR MODERATE PAIN 09/28/24  Yes Levora Reyes SAUNDERS, MD  Urine Glucose-Ketones [facility]) STRP  09/01/17  Yes [provider]   Social History   Socioeconomic History   Marital status: Married    Spouse name: Not on file   Number of children: Not on file   Years of education: Not on file   Highest education level: 12th grade  Occupational History   Not on file  Tobacco Use   Smoking status: Never   Smokeless tobacco: Never  Substance and Sexual Activity   Alcohol use:  No   Drug use: No   Sexual activity: Yes    Birth control/protection: None  Other Topics Concern   Not on file  Social History Narrative   Not on file   Social Drivers of Health   Financial Resource Strain: Medium Risk (09/14/2024)   Overall Financial Resource Strain (CARDIA)    Difficulty of Paying Living Expenses: Somewhat hard  Food Insecurity: No Food Insecurity (09/15/2024)   Hunger Vital Sign    Worried About Running Out of Food in the Last Year: Never true    Ran Out of Food in the Last Year: Never true  Transportation Needs: No Transportation Needs (09/15/2024)   PRAPARE - Administrator, Civil Service (Medical): No    Lack of Transportation (Non-Medical): No  Physical Activity: Inactive (09/15/2024)   Exercise Vital Sign    Days of Exercise per Week: 0 days    Minutes of Exercise per Session: 0 min  Stress: Stress Concern Present (09/15/2024)   [employer] of Occupational Health - Occupational Stress Questionnaire    Feeling of Stress: To some extent  Social Connections: Moderately Integrated  (09/15/2024)   Social Connection and Isolation Panel    Frequency of Communication with Friends and Family: More than three times a week    Frequency of Social Gatherings with Friends and Family: Never    Attends Religious Services: More than 4 times per year    Active Member of [employer] or Organizations: No    Attends Banker Meetings: Never    Marital Status: Married  Catering Manager Violence: Not At Risk (09/15/2024)   Humiliation, Afraid, Rape, and Kick questionnaire    Fear of Current or Ex-Partner: No    Emotionally Abused: No    Physically Abused: No    Sexually Abused: No    Review of Systems Per HPI.   Objective:   Vitals:   10/01/24 1003  BP: 108/68  Pulse: 87  Resp: 15  Temp: 98.7 F (37.1 C)  TempSrc: Temporal  SpO2: 100%  Weight: 165 lb 12.8 oz (75.2 kg)  Height: 5' 5 (1.651 m)     Physical Exam Vitals reviewed.  Constitutional:      Appearance: Normal appearance. She is well-developed.  HENT:     Head: Normocephalic and atraumatic.  Eyes:     Conjunctiva/sclera: Conjunctivae normal.     Pupils: Pupils are equal, round, and reactive to light.  Neck:     Vascular: No carotid bruit.  Cardiovascular:     Rate and Rhythm: Normal rate and regular rhythm.     Heart sounds: Normal heart sounds.  Pulmonary:     Effort: Pulmonary effort is normal.     Breath sounds: Normal breath sounds.  Abdominal:     Palpations: Abdomen is soft. There is no pulsatile mass.     Tenderness: There is no abdominal tenderness.  Musculoskeletal:     Right lower leg: No edema.     Left lower leg: No edema.  Skin:    General: Skin is warm and dry.  Neurological:     Mental Status: She is alert and oriented to person, place, and time.  Psychiatric:        Mood and Affect: Mood normal.        Behavior: Behavior normal.      Assessment & Plan:  Robin Owen is a 63 y.o. female . Essential hypertension  - stable with current regimen. No changes, recent  labs  noted.   Type 2 diabetes mellitus with hyperglycemia, with long-term current use of insulin  (HCC)  - followed by endocrine along with monitoring of hypothyroidism. Recent labs noted above.   Fibromyalgia  -  Stable, tolerating current regimen without new side effects. Ok to refill until follow up in 6 months.   Chronic systolic heart failure (HCC)  - Appears euvolemic, continue to monitor.  No med changes.  Stage 3a chronic kidney disease (HCC)  - Avoid nephrotoxins, continue to monitor.  Hyperlipidemia, unspecified hyperlipidemia type - Plan: Lipid panel  - Appears to be tolerating current dose of statin, continue same and check labs and adjust plan accordingly   Constipation, unspecified constipation type Did not tolerate fiber supplement as above.  Fiber in the diet may be better tolerated, Colace is option discussed as well, and potentially could spread out her iron supplementation to every other day if stable and closely monitored.  MiraLAX if needed.  RTC precautions given.  No orders of the defined types were placed in this encounter.  Patient Instructions  Fiber in diet maybe better tolerated than the supplement. Colace as option for stool softener. Every other day iron may be reasonable if levels are stable and monitored. Miralax if needed as well.   Thank you for coming in today. No change in medications at this time. If there are any concerns on your bloodwork, I will let you know. Take care!  I will work on the form and let you know when that has been completed.    Constipation, Adult Constipation is when a person has fewer than three bowel movements in a week, has difficulty having a bowel movement, or has stools (feces) that are dry, hard, or larger than normal. Constipation may be caused by an underlying condition. It may become worse with age if a person takes certain medicines and does not take in enough fluids. Follow these instructions at home: Eating and  drinking  Eat foods that have a lot of fiber, such as beans, whole grains, and fresh fruits and vegetables. Limit foods that are low in fiber and high in fat and processed sugars, such as fried or sweet foods. These include french fries, hamburgers, cookies, candies, and soda. Drink enough fluid to keep your urine pale yellow. General instructions Exercise regularly or as told by your health care provider. Try to do 150 minutes of moderate exercise each week. Use the bathroom when you have the urge to go. Do not hold it in. Take over-the-counter and prescription medicines only as told by your health care provider. This includes any fiber supplements. During bowel movements: Practice deep breathing while relaxing the lower abdomen. Practice pelvic floor relaxation. Watch your condition for any changes. Let your health care provider know about them. Keep all follow-up visits as told by your health care provider. This is important. Contact a health care provider if: You have pain that gets worse. You have a fever. You do not have a bowel movement after 4 days. You vomit. You are not hungry or you lose weight. You are bleeding from the opening between the buttocks (anus). You have thin, pencil-like stools. Get help right away if: You have a fever and your symptoms suddenly get worse. You leak stool or have blood in your stool. Your abdomen is bloated. You have severe pain in your abdomen. You feel dizzy or you faint. Summary Constipation is when a person has fewer than three bowel movements in a week, has difficulty having a  bowel movement, or has stools (feces) that are dry, hard, or larger than normal. Eat foods that have a lot of fiber, such as beans, whole grains, and fresh fruits and vegetables. Drink enough fluid to keep your urine pale yellow. Take over-the-counter and prescription medicines only as told by your health care provider. This includes any fiber supplements. This  information is not intended to replace advice given to you by your health care provider. Make sure you discuss any questions you have with your health care provider. Document Revised: 08/29/2022 Document Reviewed: 08/29/2022 Elsevier Patient Education  2024 [employer]vier Inc.    Signed,   Reyes Pines, MD [facility], [facility] 10/01/24 10:59 AM

## 2024-10-01 NOTE — Telephone Encounter (Signed)
 Placed [employer] and Health forms in provider folder at nurse station.

## 2024-10-04 ENCOUNTER — Encounter: Payer: Self-pay | Admitting: Family Medicine

## 2024-10-04 ENCOUNTER — Ambulatory Visit: Payer: Self-pay | Admitting: Family Medicine

## 2024-10-05 NOTE — Telephone Encounter (Signed)
 Placed a copy in pick up folder

## 2024-10-05 NOTE — Telephone Encounter (Signed)
 Paperwork completed and placed in fax bin at back nurse station.  Patient advised that this has been completed, spouse may pick it up today or tomorrow.

## 2024-10-25 ENCOUNTER — Other Ambulatory Visit: Payer: Self-pay | Admitting: Family Medicine

## 2024-10-25 DIAGNOSIS — M797 Fibromyalgia: Secondary | ICD-10-CM

## 2024-11-03 LAB — HM MAMMOGRAPHY

## 2024-11-17 NOTE — Progress Notes (Signed)
 Robin Owen                                          MRN: [id number]   11/17/2024   The [facility] Quality Team Specialist reviewed this patient medical record for the purposes of chart review for care gap closure. The following were reviewed: chart review for care gap closure-diabetic eye exam.    [facility] Quality Team

## 2025-02-19 ENCOUNTER — Ambulatory Visit (HOSPITAL_COMMUNITY): Admitting: Cardiology

## 2025-04-01 ENCOUNTER — Encounter: Admitting: Family Medicine
